# Patient Record
Sex: Male | Born: 1935 | Race: White | Hispanic: No | Smoking: Never smoker
Health system: Southern US, Community
[De-identification: ages and names within clinical notes are randomized; demographics above are authoritative.]

## PROBLEM LIST (undated history)

## (undated) DIAGNOSIS — B029 Zoster without complications: Secondary | ICD-10-CM

## (undated) DIAGNOSIS — K4090 Unilateral inguinal hernia, without obstruction or gangrene, not specified as recurrent: Secondary | ICD-10-CM

## (undated) DIAGNOSIS — C801 Malignant (primary) neoplasm, unspecified: Secondary | ICD-10-CM

## (undated) DIAGNOSIS — I447 Left bundle-branch block, unspecified: Secondary | ICD-10-CM

## (undated) DIAGNOSIS — Z87442 Personal history of urinary calculi: Secondary | ICD-10-CM

## (undated) DIAGNOSIS — K219 Gastro-esophageal reflux disease without esophagitis: Secondary | ICD-10-CM

## (undated) DIAGNOSIS — I509 Heart failure, unspecified: Secondary | ICD-10-CM

## (undated) DIAGNOSIS — Z9861 Coronary angioplasty status: Secondary | ICD-10-CM

## (undated) DIAGNOSIS — M1990 Unspecified osteoarthritis, unspecified site: Secondary | ICD-10-CM

## (undated) DIAGNOSIS — E785 Hyperlipidemia, unspecified: Secondary | ICD-10-CM

## (undated) DIAGNOSIS — N529 Male erectile dysfunction, unspecified: Secondary | ICD-10-CM

## (undated) DIAGNOSIS — I10 Essential (primary) hypertension: Secondary | ICD-10-CM

## (undated) DIAGNOSIS — I2510 Atherosclerotic heart disease of native coronary artery without angina pectoris: Secondary | ICD-10-CM

## (undated) HISTORY — DX: Male erectile dysfunction, unspecified: N52.9

## (undated) HISTORY — DX: Essential (primary) hypertension: I10

## (undated) HISTORY — DX: Left bundle-branch block, unspecified: I44.7

## (undated) HISTORY — DX: Unilateral inguinal hernia, without obstruction or gangrene, not specified as recurrent: K40.90

## (undated) HISTORY — DX: Atherosclerotic heart disease of native coronary artery without angina pectoris: I25.10

## (undated) HISTORY — DX: Coronary angioplasty status: Z98.61

## (undated) HISTORY — DX: Hyperlipidemia, unspecified: E78.5

## (undated) HISTORY — PX: MOHS SURGERY: SHX181

---

## 1977-10-19 HISTORY — PX: CHOLECYSTECTOMY: SHX55

## 1995-10-20 HISTORY — PX: CORONARY STENT PLACEMENT: SHX1402

## 1996-10-19 HISTORY — PX: CARDIAC CATHETERIZATION: SHX172

## 2004-11-14 ENCOUNTER — Encounter: Admission: RE | Admit: 2004-11-14 | Discharge: 2004-11-14 | Payer: Self-pay | Admitting: Orthopedic Surgery

## 2009-12-29 ENCOUNTER — Emergency Department (HOSPITAL_COMMUNITY): Admission: EM | Admit: 2009-12-29 | Discharge: 2009-12-29 | Payer: Self-pay | Admitting: Emergency Medicine

## 2011-01-12 LAB — POCT I-STAT, CHEM 8
BUN: 10 mg/dL (ref 6–23)
Chloride: 106 mEq/L (ref 96–112)
HCT: 38 % — ABNORMAL LOW (ref 39.0–52.0)
Sodium: 141 mEq/L (ref 135–145)

## 2012-05-06 ENCOUNTER — Encounter (INDEPENDENT_AMBULATORY_CARE_PROVIDER_SITE_OTHER): Payer: Self-pay | Admitting: General Surgery

## 2012-05-09 ENCOUNTER — Encounter (INDEPENDENT_AMBULATORY_CARE_PROVIDER_SITE_OTHER): Payer: Self-pay | Admitting: General Surgery

## 2012-05-09 ENCOUNTER — Ambulatory Visit (INDEPENDENT_AMBULATORY_CARE_PROVIDER_SITE_OTHER): Payer: Medicare Other | Admitting: General Surgery

## 2012-05-09 VITALS — BP 150/78 | HR 70 | Temp 97.6°F | Ht 67.0 in | Wt 167.4 lb

## 2012-05-09 DIAGNOSIS — K4020 Bilateral inguinal hernia, without obstruction or gangrene, not specified as recurrent: Secondary | ICD-10-CM

## 2012-05-09 NOTE — Progress Notes (Signed)
Patient ID: Parker Novel., male   DOB: 28-Jun-1932, 78 y.o.   MRN: [id number]  Chief Complaint  Patient presents with  . Pre-op Exam    eval RIH    HPI Parker Lazo. is a 78 y.o. male.  He is referred by Dr. Vianne Bulls for evaluation of a large right inguinal hernia.  This gentleman has no prior history of hernia. He's noticed a bulge in his right groin for 3 months. He says he's not having any pain.  Past history is significant for an open cholecystectomy 1979 through a long right paramedian incision. Dr. Elesa Hacker did the surgery. He has hypertension. He has CAD and had an LAD stent placed 1997. EKG shows left bundle branch block.  He is retired from [employer] and [employer] and did moderate lifting there.  He sees  his cardiologist  annually but cannot remember the name of his cardiologist. HPI  Past Medical History  Diagnosis Date  . Hypertension   . ASCVD (arteriosclerotic cardiovascular disease)     single vessel  . Dyslipidemia   . ED (erectile dysfunction)   . Left bundle branch block     chronic  . Inguinal hernia   . Postsurgical percutaneous transluminal coronary angioplasty status   . Coronary atherosclerosis of native coronary artery     Past Surgical History  Procedure Date  . Coronary stent placement 1997  . Cholecystectomy 1979    Family History  Problem Relation Age of Onset  . Arthritis Father     Social History History  Substance Use Topics  . Smoking status: Never Smoker   . Smokeless tobacco: Never Used  . Alcohol Use: No    No Known Allergies  Current Outpatient Prescriptions  Medication Sig Dispense Refill  . aspirin 81 MG tablet Take 81 mg by mouth daily.      Marland Kitchen atorvastatin (LIPITOR) 40 MG tablet Take 40 mg by mouth daily.      Marland Kitchen lisinopril-hydrochlorothiazide (PRINZIDE,ZESTORETIC) 20-12.5 MG per tablet Take 1 tablet by mouth daily.      . Lutein 20 MG CAPS Take 20 mg by mouth every other day.      . Multiple  Vitamins-Minerals ([employer] SPECTRAVITE PO) Take by mouth every other day.      . omega-3 acid ethyl esters (LOVAZA) 1 G capsule Take 2 g by mouth 2 (two) times daily.        Review of Systems Review of Systems  Constitutional: Negative for fever, chills and unexpected weight change.  HENT: Negative for hearing loss, congestion, sore throat, trouble swallowing and voice change.   Eyes: Negative for visual disturbance.  Respiratory: Negative for cough and wheezing.   Cardiovascular: Negative for chest pain, palpitations and leg swelling.  Gastrointestinal: Negative for nausea, vomiting, abdominal pain, diarrhea, constipation, blood in stool, abdominal distention, anal bleeding and rectal pain.  Genitourinary: Negative for hematuria and difficulty urinating.  Musculoskeletal: Negative for arthralgias.  Skin: Negative for rash and wound.  Neurological: Negative for seizures, syncope, weakness and headaches.  Hematological: Negative for adenopathy. Does not bruise/bleed easily.  Psychiatric/Behavioral: Negative for confusion.    Blood pressure 150/78, pulse 70, temperature 97.6 F (36.4 C), temperature source Temporal, height 5\' 7"  (1.702 m), weight 167 lb 6.4 oz (75.932 kg), SpO2 97.00%.  Physical Exam Physical Exam  Constitutional: He is oriented to person, place, and time. He appears well-developed and well-nourished. No distress.  HENT:  Head: Normocephalic.  Nose: Nose normal.  Mouth/Throat: No oropharyngeal exudate.  Eyes: Conjunctivae and EOM are normal. Pupils are equal, round, and reactive to light. Right eye exhibits no discharge. Left eye exhibits no discharge. No scleral icterus.  Neck: Normal range of motion. Neck supple. No JVD present. No tracheal deviation present. No thyromegaly present.  Cardiovascular: Normal rate, regular rhythm, normal heart sounds and intact distal pulses.   No murmur heard. Pulmonary/Chest: Effort normal and breath sounds normal. No stridor. No  respiratory distress. He has no wheezes. He has no rales. He exhibits no tenderness.  Abdominal: Soft. Bowel sounds are normal. He exhibits no distension and no mass. There is no tenderness. There is no rebound and no guarding.       Long right paramedian incision mostly in the right upper quadrant but extending below the umbilicus. Well healed no hernia. Nontender.  Genitourinary:       Very large right inguinal hernia. Smaller left inguinal hernia. Both reducible when supine. Testes descended but atrophic.  Musculoskeletal: Normal range of motion. He exhibits no edema and no tenderness.  Lymphadenopathy:    He has no cervical adenopathy.  Neurological: He is alert and oriented to person, place, and time. He has normal reflexes. Coordination normal.  Skin: Skin is warm and dry. No rash noted. He is not diaphoretic. No erythema. No pallor.  Psychiatric: He has a normal mood and affect. His behavior is normal. Judgment and thought content normal.    Data Reviewed Dr. Charlott Rakes office notes.  Assessment    Very large right inguinal hernia.  Smaller left inguinal hernia  CAD, stable, status post LAD stent 1997  Hypertension  Status post open cholecystectomy    Plan    We had a long talk about his large right inguinal hernia and the finding of a left inguinal hernia. He is minimally symptomatic, which is surprising. He was given the option of elective surgery at his convenience. After a long discussion he decided he wanted to have this done sometime later this summer  We will schedule for open repair of bilateral inguinal hernias with mesh. I think the recurrence rate would be too high to do the right side laparoscopically.  I discussed the indications, details, techniques, and numerous risks of inguinal hernia repair with him. I gave him written patient information booklet and discussed the drawings with him. He understands these issues. His questions were answered. He agrees with the  plan.  We will ask for cardiac clearance preop.  We will plan to keep him in the hospital one might for observation.       Angelia Mould. Derrell Lolling, M.D., [facility] General and Minimally invasive Surgery Breast and Colorectal Surgery Office:   [phone] Pager:   [phone]  05/09/2012, 3:51 PM

## 2012-05-09 NOTE — Patient Instructions (Addendum)
You have a very large right inguinal hernia and a smaller left inguinal hernia. I can push these back in.  There is no immediate danger.  We have talked about different options for management, and you have decided to have these repaired later on this summer.  We will need to have your cardiologist give [place] clearance for the surgery, and we will check with Dr. Tenny Craw to see who your cardiologist is.  We will set up the surgery to repair your bilateral inguinal hernias in the near future. We will plan to keep you in the hospital overnight.    Inguinal Hernia, Adult Muscles help keep everything in the body in its proper place. But if a weak spot in the muscles develops, something can poke through. That is called a hernia. When this happens in the lower part of the belly (abdomen), it is called an inguinal hernia. (It takes its name from a part of the body in this region called the inguinal canal.) A weak spot in the wall of muscles lets some fat or part of the small intestine bulge through. An inguinal hernia can develop at any age. Men get them more often than women. CAUSES  In adults, an inguinal hernia develops over time.  It can be triggered by:   Suddenly straining the muscles of the lower abdomen.   Lifting heavy objects.   Straining to have a bowel movement. Difficult bowel movements (constipation) can lead to this.   Constant coughing. This may be caused by smoking or lung disease.   Being overweight.   Being pregnant.   Working at a job that requires long periods of standing or heavy lifting.   Having had an inguinal hernia before.  One type can be an emergency situation. It is called a strangulated inguinal hernia. It develops if part of the small intestine slips through the weak spot and cannot get back into the abdomen. The blood supply can be cut off. If that happens, part of the intestine may die. This situation requires emergency surgery. SYMPTOMS  Often, a small inguinal  hernia has no symptoms. It is found when a healthcare provider does a physical exam. Larger hernias usually have symptoms.   In adults, symptoms may include:   A lump in the groin. This is easier to see when the person is standing. It might disappear when lying down.   In men, a lump in the scrotum.   Pain or burning in the groin. This occurs especially when lifting, straining or coughing.   A dull ache or feeling of pressure in the groin.   Signs of a strangulated hernia can include:   A bulge in the groin that becomes very painful and tender to the touch.   A bulge that turns red or purple.   Fever, nausea and vomiting.   Inability to have a bowel movement or to pass gas.  DIAGNOSIS  To decide if you have an inguinal hernia, a healthcare provider will probably do a physical examination.  This will include asking questions about any symptoms you have noticed.   The healthcare provider might feel the groin area and ask you to cough. If an inguinal hernia is felt, the healthcare provider may try to slide it back into the abdomen.   Usually no other tests are needed.  TREATMENT  Treatments can vary. The size of the hernia makes a difference. Options include:  Watchful waiting. This is often suggested if the hernia is small and you  have had no symptoms.   No medical procedure will be done unless symptoms develop.   You will need to watch closely for symptoms. If any occur, contact your healthcare provider right away.   Surgery. This is used if the hernia is larger or you have symptoms.   Open surgery. This is usually an outpatient procedure (you will not stay overnight in a hospital). An cut (incision) is made through the skin in the groin. The hernia is put back inside the abdomen. The weak area in the muscles is then repaired by herniorrhaphy or hernioplasty. Herniorrhaphy: in this type of surgery, the weak muscles are sewn back together. Hernioplasty: a patch or mesh is used  to close the weak area in the abdominal wall.   Laparoscopy. In this procedure, a surgeon makes small incisions. A thin tube with a tiny video camera (called a laparoscope) is put into the abdomen. The surgeon repairs the hernia with mesh by looking with the video camera and using two long instruments.  HOME CARE INSTRUCTIONS   After surgery to repair an inguinal hernia:   You will need to take pain medicine prescribed by your healthcare provider. Follow all directions carefully.   You will need to take care of the wound from the incision.   Your activity will be restricted for awhile. This will probably include no heavy lifting for several weeks. You also should not do anything too active for a few weeks. When you can return to work will depend on the type of job that you have.   During "watchful waiting" periods, you should:   Maintain a healthy weight.   Eat a diet high in fiber (fruits, vegetables and whole grains).   Drink plenty of fluids to avoid constipation. This means drinking enough water and other liquids to keep your urine clear or pale yellow.   Do not lift heavy objects.   Do not stand for long periods of time.   Quit smoking. This should keep you from developing a frequent cough.  SEEK MEDICAL CARE IF:   A bulge develops in your groin area.   You feel pain, a burning sensation or pressure in the groin. This might be worse if you are lifting or straining.   You develop a fever of more than 100.5 F (38.1 C).  SEEK IMMEDIATE MEDICAL CARE IF:   Pain in the groin increases suddenly.   A bulge in the groin gets bigger suddenly and does not go down.   For men, there is sudden pain in the scrotum. Or, the size of the scrotum increases.   A bulge in the groin area becomes red or purple and is painful to touch.   You have nausea or vomiting that does not go away.   You feel your heart beating much faster than normal.   You cannot have a bowel movement or pass  gas.   You develop a fever of more than 102.0 F (38.9 C).  Document Released: 02/21/2009 Document Revised: 09/24/2011 Document Reviewed: 02/21/2009 [facility] [place] Patient Information 2012 [place], [place].

## 2012-05-25 HISTORY — PX: CARDIOVASCULAR STRESS TEST: SHX262

## 2012-06-01 ENCOUNTER — Encounter (INDEPENDENT_AMBULATORY_CARE_PROVIDER_SITE_OTHER): Payer: Self-pay | Admitting: General Surgery

## 2012-06-01 NOTE — Progress Notes (Signed)
Received cardiac clearance from Dr. Eldridge Dace. Copy sent to medical records to be scanned into chart. Copy attached to OR posting sheet.

## 2012-06-02 ENCOUNTER — Encounter (HOSPITAL_COMMUNITY): Payer: Self-pay | Admitting: Pharmacy Technician

## 2012-06-08 ENCOUNTER — Encounter (INDEPENDENT_AMBULATORY_CARE_PROVIDER_SITE_OTHER): Payer: Self-pay

## 2012-06-13 ENCOUNTER — Encounter (HOSPITAL_COMMUNITY): Payer: Self-pay

## 2012-06-13 ENCOUNTER — Encounter (HOSPITAL_COMMUNITY)
Admission: RE | Admit: 2012-06-13 | Discharge: 2012-06-13 | Disposition: A | Discharge status: Home / Self Care | Payer: Medicare Other | Source: Ambulatory Visit | Attending: General Surgery | Admitting: General Surgery

## 2012-06-13 ENCOUNTER — Other Ambulatory Visit (INDEPENDENT_AMBULATORY_CARE_PROVIDER_SITE_OTHER): Payer: Self-pay | Admitting: General Surgery

## 2012-06-13 LAB — COMPREHENSIVE METABOLIC PANEL
ALT: 27 U/L (ref 0–53)
AST: 26 U/L (ref 0–37)
Alkaline Phosphatase: 86 U/L (ref 39–117)
CO2: 29 mEq/L (ref 19–32)
GFR calc Af Amer: >90 mL/min (ref >90)
GFR calc non Af Amer: 81 mL/min — ABNORMAL LOW (ref >90)
Glucose, Bld: 103 mg/dL — ABNORMAL HIGH (ref 70–99)
Potassium: 3.9 mEq/L (ref 3.5–5.1)
Sodium: 141 mEq/L (ref 135–145)
Total Protein: 7.2 g/dL (ref 6.0–8.3)

## 2012-06-13 LAB — CBC WITH DIFFERENTIAL/PLATELET
Basophils Absolute: 0 10*3/uL (ref 0.0–0.1)
Lymphocytes Relative: 33 % (ref 12–46)
Lymphs Abs: 2.4 10*3/uL (ref 0.7–4.0)
Neutrophils Relative %: 53 % (ref 43–77)
Platelets: 122 10*3/uL — ABNORMAL LOW (ref 150–400)
RBC: 4.04 MIL/uL — ABNORMAL LOW (ref 4.22–5.81)
WBC: 7.3 10*3/uL (ref 4.0–10.5)

## 2012-06-13 LAB — URINALYSIS, ROUTINE W REFLEX MICROSCOPIC
Bilirubin Urine: NEGATIVE
Hgb urine dipstick: NEGATIVE
Ketones, ur: NEGATIVE mg/dL
Nitrite: NEGATIVE
pH: 7 (ref 5.0–8.0)

## 2012-06-13 LAB — SURGICAL PCR SCREEN
MRSA, PCR: NEGATIVE
Staphylococcus aureus: NEGATIVE

## 2012-06-13 NOTE — Progress Notes (Addendum)
Spoke with Britta Mccreedy in Dr. Jacinto Halim office, orders in system have expired, need new surgical orders.   Requested cardiac clearance/OV note, EKG from [facility].  Pt was not aware of or did not remember discussing L inguinal hernia repair, so did not sign consent pending verification of order. Dr. Jacinto Halim note states that pt has both R & L inguinal hernias and discussion with pt regarding repair. Pt to sign consent DOS.

## 2012-06-13 NOTE — Pre-Procedure Instructions (Signed)
20 Parker Odonnell.  06/13/2012   Your procedure is scheduled on:  Wednesday August 28  Report to [facility] at 6:30 AM.  Call this number if you have problems the morning of surgery: [phone]   Remember:   Do not eat or drink:After Midnight.    Take these medicines the morning of surgery with A SIP OF WATER: none   Do not wear jewelry, make-up or nail polish.  Do not wear lotions, powders, or perfumes. You may wear deodorant.  Do not shave 48 hours prior to surgery. Men may shave face and neck.  Do not bring valuables to the hospital.  Contacts, dentures or bridgework may not be worn into surgery.  Leave suitcase in the car. After surgery it may be brought to your room.  For patients admitted to the hospital, checkout time is 11:00 AM the day of discharge.   Patients discharged the day of surgery will not be allowed to drive home.  Name and phone number of your driver: NA  Special Instructions: CHG Shower Use Special Wash: 1/2 bottle night before surgery and 1/2 bottle morning of surgery.   Please read over the following fact sheets that you were given: Pain Booklet, Coughing and Deep Breathing and Surgical Site Infection Prevention

## 2012-06-14 MED ORDER — CHLORHEXIDINE GLUCONATE 4 % EX LIQD: 1.0000 "application " | Freq: Once | CUTANEOUS | Status: DC

## 2012-06-14 MED ORDER — HEPARIN SODIUM (PORCINE) 5000 UNIT/ML IJ SOLN
5000.0000 [IU] | Freq: Once | INTRAMUSCULAR | Status: DC
  Filled 2012-06-14: qty 1

## 2012-06-14 MED ORDER — CEFAZOLIN SODIUM-DEXTROSE 2-3 GM-% IV SOLR
2.0000 g | INTRAVENOUS | Status: AC
  Administered 2012-06-15: 2 g via INTRAVENOUS
  Filled 2012-06-14: qty 50

## 2012-06-14 MED ORDER — CEFAZOLIN SODIUM-DEXTROSE 2-3 GM-% IV SOLR: 2.0000 g | INTRAVENOUS | Status: DC

## 2012-06-14 NOTE — Consult Note (Signed)
Anesthesia chart review: Patient is a 78 year old male scheduled for open repair of bilateral inguinal hernias with mesh on 06/15/2012 by Dr. Derrell Lolling. History includes nonsmoker, coronary artery disease s/p LAD stent '97, chronic left BBB, HTN, dyslipidemia, ED.  PCP is Dr. Duane Lope.    Cardiologist is Dr. Eldridge Dace [facility]). Patient was cleared for this procedure following a stress test on 05/20/12 that showed normal myocardial perfusion, post-stress EF 52%, low risk scan.  His last echo was in 2003 and showed mild LVH, trace-mild MR.    EKG on 06/13/12 showed NSR with first degree AVB, occasional PVCs, LAD, left BBB.    He had a negative CXR on 06/13/12.  Labs noted.  PLT 122.  H/H, AST/ALT WNL.  Total bilirubin 1.4. Cr 0.87. Glucose 103.  Plan to proceed if no acute CV symptoms.  Shonna Chock, PA-C

## 2012-06-14 NOTE — H&P (Signed)
Parker Odonnell.     MRN: [id number]   Description: 78 year old male  Provider: Ernestene Mention, MD  Department: [facility]       Diagnoses     Bilateral inguinal hernia   - Primary    550.92        Vitals    BP Pulse Temp Ht Wt BMI    150/78 70 97.6 F (36.4 C) (Temporal) 5\' 7"  (1.702 m) 167 lb 6.4 oz (75.932 kg) 26.22 kg/m2                History and Physical     Ernestene Mention, MD   Patient ID: Parker Odonnell., male   DOB: 1932/06/14, 78 y.o.   MRN: [id number]             HPI Parker Odonnell. is a 78 y.o. male.  He is referred by Dr. Vianne Bulls for evaluation of a large right inguinal hernia.   This gentleman has no prior history of hernia. He's noticed a bulge in his right groin for 3 months. He says he's not having any pain.   Past history is significant for an open cholecystectomy 1979 through a long right paramedian incision. Dr. Elesa Hacker did the surgery. He has hypertension. He has CAD and had an LAD stent placed 1997. EKG shows left bundle branch block.   He is retired from [employer] and [employer] and did moderate lifting there.   He sees  his cardiologist  annually but cannot remember the name of his cardiologist.     Past Medical History   Diagnosis  Date   .  Hypertension     .  ASCVD (arteriosclerotic cardiovascular disease)         single vessel   .  Dyslipidemia     .  ED (erectile dysfunction)     .  Left bundle branch block         chronic   .  Inguinal hernia     .  Postsurgical percutaneous transluminal coronary angioplasty status     .  Coronary atherosclerosis of native coronary artery         Past Surgical History   Procedure  Date   .  Coronary stent placement  1997   .  Cholecystectomy  1979       Family History   Problem  Relation  Age of Onset   .  Arthritis  Father        Social History History   Substance Use Topics   .  Smoking status:  Never Smoker    .  Smokeless tobacco:  Never Used     .  Alcohol Use:  No      No Known Allergies    Current Outpatient Prescriptions   Medication  Sig  Dispense  Refill   .  aspirin 81 MG tablet  Take 81 mg by mouth daily.         Marland Kitchen  atorvastatin (LIPITOR) 40 MG tablet  Take 40 mg by mouth daily.         Marland Kitchen  lisinopril-hydrochlorothiazide (PRINZIDE,ZESTORETIC) 20-12.5 MG per tablet  Take 1 tablet by mouth daily.         .  Lutein 20 MG CAPS  Take 20 mg by mouth every other day.         .  Multiple Vitamins-Minerals ([employer]S SPECTRAVITE PO)  Take by mouth every  other day.         .  omega-3 acid ethyl esters (LOVAZA) 1 G capsule  Take 2 g by mouth 2 (two) times daily.            Review of Systems  Constitutional: Negative for fever, chills and unexpected weight change.  HENT: Negative for hearing loss, congestion, sore throat, trouble swallowing and voice change.   Eyes: Negative for visual disturbance.  Respiratory: Negative for cough and wheezing.   Cardiovascular: Negative for chest pain, palpitations and leg swelling.  Gastrointestinal: Negative for nausea, vomiting, abdominal pain, diarrhea, constipation, blood in stool, abdominal distention, anal bleeding and rectal pain.  Genitourinary: Negative for hematuria and difficulty urinating.  Musculoskeletal: Negative for arthralgias.  Skin: Negative for rash and wound.  Neurological: Negative for seizures, syncope, weakness and headaches.  Hematological: Negative for adenopathy. Does not bruise/bleed easily.  Psychiatric/Behavioral: Negative for confusion.    Blood pressure 150/78, pulse 70, temperature 97.6 F (36.4 C), temperature source Temporal, height 5\' 7"  (1.702 m), weight 167 lb 6.4 oz (75.932 kg), SpO2 97.00%.   Physical Exam  Constitutional: He is oriented to person, place, and time. He appears well-developed and well-nourished. No distress.  HENT:   Head: Normocephalic.   Nose: Nose normal.   Mouth/Throat: No oropharyngeal exudate.  Eyes: Conjunctivae and EOM are  normal. Pupils are equal, round, and reactive to light. Right eye exhibits no discharge. Left eye exhibits no discharge. No scleral icterus.  Neck: Normal range of motion. Neck supple. No JVD present. No tracheal deviation present. No thyromegaly present.  Cardiovascular: Normal rate, regular rhythm, normal heart sounds and intact distal pulses.    No murmur heard. Pulmonary/Chest: Effort normal and breath sounds normal. No stridor. No respiratory distress. He has no wheezes. He has no rales. He exhibits no tenderness.  Abdominal: Soft. Bowel sounds are normal. He exhibits no distension and no mass. There is no tenderness. There is no rebound and no guarding.       Long right paramedian incision mostly in the right upper quadrant but extending below the umbilicus. Well healed no hernia. Nontender.  Genitourinary:       Very large right inguinal hernia. Smaller left inguinal hernia. Both reducible when supine. Testes descended but atrophic.  Musculoskeletal: Normal range of motion. He exhibits no edema and no tenderness.  Lymphadenopathy:    He has no cervical adenopathy.  Neurological: He is alert and oriented to person, place, and time. He has normal reflexes. Coordination normal.  Skin: Skin is warm and dry. No rash noted. He is not diaphoretic. No erythema. No pallor.  Psychiatric: He has a normal mood and affect. His behavior is normal. Judgment and thought content normal.    Data Reviewed Dr. Charlott Rakes office notes.   Assessment Very large right inguinal hernia.   Smaller left inguinal hernia   CAD, stable, status post LAD stent 1997   Hypertension   Status post open cholecystectomy   Plan We had a long talk about his large right inguinal hernia and the finding of a left inguinal hernia. He is minimally symptomatic, which is surprising. He was given the option of elective surgery at his convenience. After a long discussion he decided he wanted to have this done sometime later  this summer   We will schedule for open repair of bilateral inguinal hernias with mesh. I think the recurrence rate would be too high to do the right side laparoscopically.   I discussed the indications,  details, techniques, and numerous risks of inguinal hernia repair with him. I gave him written patient information booklet and discussed the drawings with him. He understands these issues. His questions were answered. He agrees with the plan.   We will ask for cardiac clearance preop.   We will plan to keep him in the hospital one might for observation.       Angelia Mould. Derrell Lolling, M.D., [facility] General and Minimally invasive Surgery Breast and Colorectal Surgery Office:   [phone] Pager:   [phone]

## 2012-06-15 ENCOUNTER — Encounter (HOSPITAL_COMMUNITY): Payer: Self-pay | Admitting: Vascular Surgery

## 2012-06-15 ENCOUNTER — Ambulatory Visit (HOSPITAL_COMMUNITY): Payer: Medicare Other | Admitting: Vascular Surgery

## 2012-06-15 ENCOUNTER — Ambulatory Visit (HOSPITAL_COMMUNITY)
Admission: RE | Admit: 2012-06-15 | Discharge: 2012-06-16 | Disposition: A | Discharge status: Home / Self Care | Payer: Medicare Other | Source: Ambulatory Visit | Attending: General Surgery | Admitting: General Surgery

## 2012-06-15 ENCOUNTER — Encounter (HOSPITAL_COMMUNITY)
Admission: RE | Disposition: A | Discharge status: Home / Self Care | Payer: Self-pay | Source: Ambulatory Visit | Attending: General Surgery

## 2012-06-15 ENCOUNTER — Encounter (HOSPITAL_COMMUNITY): Payer: Self-pay | Admitting: *Deleted

## 2012-06-15 DIAGNOSIS — Z01818 Encounter for other preprocedural examination: Secondary | ICD-10-CM | POA: Insufficient documentation

## 2012-06-15 DIAGNOSIS — K4020 Bilateral inguinal hernia, without obstruction or gangrene, not specified as recurrent: Secondary | ICD-10-CM

## 2012-06-15 DIAGNOSIS — Z01810 Encounter for preprocedural cardiovascular examination: Secondary | ICD-10-CM | POA: Insufficient documentation

## 2012-06-15 DIAGNOSIS — I10 Essential (primary) hypertension: Secondary | ICD-10-CM | POA: Insufficient documentation

## 2012-06-15 DIAGNOSIS — I2510 Atherosclerotic heart disease of native coronary artery without angina pectoris: Secondary | ICD-10-CM | POA: Insufficient documentation

## 2012-06-15 DIAGNOSIS — Z9861 Coronary angioplasty status: Secondary | ICD-10-CM | POA: Insufficient documentation

## 2012-06-15 DIAGNOSIS — Z01812 Encounter for preprocedural laboratory examination: Secondary | ICD-10-CM | POA: Insufficient documentation

## 2012-06-15 HISTORY — PX: INGUINAL HERNIA REPAIR: SHX194

## 2012-06-15 SURGERY — REPAIR, HERNIA, INGUINAL, ADULT
Anesthesia: General | Site: Groin | Laterality: Bilateral | Wound class: Clean

## 2012-06-15 MED ORDER — OMEGA-3-ACID ETHYL ESTERS 1 G PO CAPS
2.0000 g | ORAL_CAPSULE | Freq: Every day | ORAL | Status: DC
  Administered 2012-06-15 – 2012-06-16 (×2): 2 g via ORAL
  Filled 2012-06-15 (×2): qty 2

## 2012-06-15 MED ORDER — ASPIRIN 81 MG PO TABS: 81.0000 mg | ORAL_TABLET | Freq: Every day | ORAL | Status: DC

## 2012-06-15 MED ORDER — HEPARIN SODIUM (PORCINE) 5000 UNIT/ML IJ SOLN
5000.0000 [IU] | Freq: Three times a day (TID) | INTRAMUSCULAR | Status: DC
  Administered 2012-06-16: 5000 [IU] via SUBCUTANEOUS
  Filled 2012-06-15 (×4): qty 1

## 2012-06-15 MED ORDER — BUPIVACAINE HCL (PF) 0.5 % IJ SOLN
INTRAMUSCULAR | Status: AC
  Filled 2012-06-15: qty 30

## 2012-06-15 MED ORDER — FENTANYL CITRATE 0.05 MG/ML IJ SOLN
INTRAMUSCULAR | Status: DC | PRN
  Administered 2012-06-15 (×3): 50 ug via INTRAVENOUS

## 2012-06-15 MED ORDER — GLYCOPYRROLATE 0.2 MG/ML IJ SOLN
INTRAMUSCULAR | Status: DC | PRN
  Administered 2012-06-15: 0.4 mg via INTRAVENOUS

## 2012-06-15 MED ORDER — LIDOCAINE HCL (CARDIAC) 20 MG/ML IV SOLN: INTRAVENOUS | Status: DC | PRN

## 2012-06-15 MED ORDER — ONDANSETRON HCL 4 MG/2ML IJ SOLN
INTRAMUSCULAR | Status: DC | PRN
  Administered 2012-06-15: 4 mg via INTRAVENOUS

## 2012-06-15 MED ORDER — LIDOCAINE HCL (CARDIAC) 20 MG/ML IV SOLN
INTRAVENOUS | Status: DC | PRN
  Administered 2012-06-15: 60 mg via INTRAVENOUS

## 2012-06-15 MED ORDER — LACTATED RINGERS IV SOLN
INTRAVENOUS | Status: DC | PRN
  Administered 2012-06-15 (×3): via INTRAVENOUS

## 2012-06-15 MED ORDER — KETOROLAC TROMETHAMINE 30 MG/ML IJ SOLN
INTRAMUSCULAR | Status: AC
  Filled 2012-06-15: qty 1

## 2012-06-15 MED ORDER — SODIUM CHLORIDE 0.9 % IR SOLN
Status: DC | PRN
  Administered 2012-06-15: 1

## 2012-06-15 MED ORDER — POTASSIUM CHLORIDE IN NACL 20-0.9 MEQ/L-% IV SOLN
INTRAVENOUS | Status: DC
  Administered 2012-06-15 – 2012-06-16 (×2): via INTRAVENOUS
  Filled 2012-06-15 (×5): qty 1000

## 2012-06-15 MED ORDER — PROPOFOL 10 MG/ML IV EMUL
INTRAVENOUS | Status: DC | PRN
  Administered 2012-06-15: 160 mg via INTRAVENOUS

## 2012-06-15 MED ORDER — KETOROLAC TROMETHAMINE 15 MG/ML IJ SOLN
15.0000 mg | Freq: Once | INTRAMUSCULAR | Status: AC
  Administered 2012-06-15: 15 mg via INTRAVENOUS
  Filled 2012-06-15: qty 1

## 2012-06-15 MED ORDER — LISINOPRIL 20 MG PO TABS
20.0000 mg | ORAL_TABLET | Freq: Every day | ORAL | Status: DC
  Administered 2012-06-15: 20 mg via ORAL
  Filled 2012-06-15 (×2): qty 1

## 2012-06-15 MED ORDER — LUTEIN 20 MG PO CAPS: 20.0000 mg | ORAL_CAPSULE | ORAL | Status: DC

## 2012-06-15 MED ORDER — POLYVINYL ALCOHOL 1.4 % OP SOLN
1.0000 [drp] | Freq: Every day | OPHTHALMIC | Status: DC
  Administered 2012-06-15 – 2012-06-16 (×2): 1 [drp] via OPHTHALMIC
  Filled 2012-06-15: qty 15

## 2012-06-15 MED ORDER — ARTIFICIAL TEARS OP OINT
TOPICAL_OINTMENT | OPHTHALMIC | Status: DC | PRN
  Administered 2012-06-15: 1 via OPHTHALMIC

## 2012-06-15 MED ORDER — LIDOCAINE-EPINEPHRINE 0.5 %-1:200000 IJ SOLN
INTRAMUSCULAR | Status: AC
  Filled 2012-06-15: qty 1

## 2012-06-15 MED ORDER — ROCURONIUM BROMIDE 100 MG/10ML IV SOLN
INTRAVENOUS | Status: DC | PRN
  Administered 2012-06-15: 50 mg via INTRAVENOUS

## 2012-06-15 MED ORDER — ASPIRIN EC 81 MG PO TBEC
81.0000 mg | DELAYED_RELEASE_TABLET | Freq: Every day | ORAL | Status: DC
  Administered 2012-06-15 – 2012-06-16 (×2): 81 mg via ORAL
  Filled 2012-06-15 (×2): qty 1

## 2012-06-15 MED ORDER — DIPHENHYDRAMINE HCL 50 MG/ML IJ SOLN
INTRAMUSCULAR | Status: DC | PRN
  Administered 2012-06-15: 50 mg via INTRAVENOUS

## 2012-06-15 MED ORDER — ATORVASTATIN CALCIUM 40 MG PO TABS
40.0000 mg | ORAL_TABLET | Freq: Every day | ORAL | Status: DC
  Administered 2012-06-15: 40 mg via ORAL
  Filled 2012-06-15 (×2): qty 1

## 2012-06-15 MED ORDER — ONDANSETRON HCL 4 MG/2ML IJ SOLN: 4.0000 mg | Freq: Once | INTRAMUSCULAR | Status: DC | PRN

## 2012-06-15 MED ORDER — ONDANSETRON HCL 4 MG/2ML IJ SOLN: 4.0000 mg | Freq: Four times a day (QID) | INTRAMUSCULAR | Status: DC | PRN

## 2012-06-15 MED ORDER — HYDROCHLOROTHIAZIDE 12.5 MG PO CAPS
12.5000 mg | ORAL_CAPSULE | Freq: Every day | ORAL | Status: DC
  Administered 2012-06-15 – 2012-06-16 (×2): 12.5 mg via ORAL
  Filled 2012-06-15 (×2): qty 1

## 2012-06-15 MED ORDER — POLYETHYL GLYCOL-PROPYL GLYCOL 0.4-0.3 % OP SOLN: 1.0000 [drp] | Freq: Every day | OPHTHALMIC | Status: DC

## 2012-06-15 MED ORDER — ONDANSETRON HCL 4 MG PO TABS: 4.0000 mg | ORAL_TABLET | Freq: Four times a day (QID) | ORAL | Status: DC | PRN

## 2012-06-15 MED ORDER — OXYCODONE-ACETAMINOPHEN 5-325 MG PO TABS: 1.0000 | ORAL_TABLET | ORAL | Status: DC | PRN

## 2012-06-15 MED ORDER — LIDOCAINE HCL 4 % MT SOLN
OROMUCOSAL | Status: DC | PRN
  Administered 2012-06-15: 4 mL via TOPICAL

## 2012-06-15 MED ORDER — HYDROMORPHONE HCL PF 1 MG/ML IJ SOLN: 0.2500 mg | INTRAMUSCULAR | Status: DC | PRN

## 2012-06-15 MED ORDER — MORPHINE SULFATE 2 MG/ML IJ SOLN
2.0000 mg | INTRAMUSCULAR | Status: DC | PRN
  Filled 2012-06-15: qty 1

## 2012-06-15 MED ORDER — LISINOPRIL-HYDROCHLOROTHIAZIDE 20-12.5 MG PO TABS: 1.0000 | ORAL_TABLET | Freq: Every day | ORAL | Status: DC

## 2012-06-15 MED ORDER — OMEGA-3-ACID ETHYL ESTERS 1 G PO CAPS
1.0000 g | ORAL_CAPSULE | Freq: Every day | ORAL | Status: DC
  Filled 2012-06-15 (×2): qty 1

## 2012-06-15 MED ORDER — CEFAZOLIN SODIUM-DEXTROSE 2-3 GM-% IV SOLR
2.0000 g | Freq: Four times a day (QID) | INTRAVENOUS | Status: AC
  Administered 2012-06-15 – 2012-06-16 (×3): 2 g via INTRAVENOUS
  Filled 2012-06-15 (×4): qty 50

## 2012-06-15 MED ORDER — LIDOCAINE-EPINEPHRINE (PF) 1 %-1:200000 IJ SOLN
INTRAMUSCULAR | Status: AC
  Filled 2012-06-15: qty 10

## 2012-06-15 MED ORDER — LIDOCAINE-EPINEPHRINE 0.5 %-1:200000 IJ SOLN
INTRAMUSCULAR | Status: DC | PRN
  Administered 2012-06-15: 50 mL

## 2012-06-15 MED ORDER — HEPARIN SODIUM (PORCINE) 5000 UNIT/ML IJ SOLN
5000.0000 [IU] | Freq: Once | INTRAMUSCULAR | Status: AC
  Administered 2012-06-15: 5000 [IU] via SUBCUTANEOUS

## 2012-06-15 MED ORDER — NEOSTIGMINE METHYLSULFATE 1 MG/ML IJ SOLN
INTRAMUSCULAR | Status: DC | PRN
  Administered 2012-06-15: 3 mg via INTRAVENOUS

## 2012-06-15 SURGICAL SUPPLY — 56 items
BLADE SURG 10 STRL SS (BLADE) ×2 IMPLANT
BLADE SURG 15 STRL LF DISP TIS (BLADE) ×1 IMPLANT
BLADE SURG 15 STRL SS (BLADE) ×1
BLADE SURG ROTATE 9660 (MISCELLANEOUS) ×2 IMPLANT
CANISTER SUCTION 2500CC (MISCELLANEOUS) ×2 IMPLANT
CHLORAPREP W/TINT 26ML (MISCELLANEOUS) ×2 IMPLANT
CLOTH BEACON ORANGE TIMEOUT ST (SAFETY) ×2 IMPLANT
COVER SURGICAL LIGHT HANDLE (MISCELLANEOUS) ×2 IMPLANT
DECANTER SPIKE VIAL GLASS SM (MISCELLANEOUS) ×2 IMPLANT
DERMABOND ADVANCED (GAUZE/BANDAGES/DRESSINGS) ×2
DERMABOND ADVANCED .7 DNX12 (GAUZE/BANDAGES/DRESSINGS) ×2 IMPLANT
DRAIN PENROSE 1/2X12 LTX STRL (WOUND CARE) ×2 IMPLANT
DRAPE LAPAROTOMY TRNSV 102X78 (DRAPE) ×2 IMPLANT
DRAPE UTILITY 15X26 W/TAPE STR (DRAPE) ×4 IMPLANT
ELECT CAUTERY BLADE 6.4 (BLADE) ×2 IMPLANT
ELECT REM PT RETURN 9FT ADLT (ELECTROSURGICAL) ×2
ELECTRODE REM PT RTRN 9FT ADLT (ELECTROSURGICAL) ×1 IMPLANT
GLOVE BIOGEL PI IND STRL 6.5 (GLOVE) ×1 IMPLANT
GLOVE BIOGEL PI INDICATOR 6.5 (GLOVE) ×1
GLOVE ECLIPSE 6.5 STRL STRAW (GLOVE) ×2 IMPLANT
GLOVE EUDERMIC 7 POWDERFREE (GLOVE) ×2 IMPLANT
GLOVE SURG SS PI 6.5 STRL IVOR (GLOVE) ×2 IMPLANT
GOWN PREVENTION PLUS XLARGE (GOWN DISPOSABLE) ×2 IMPLANT
GOWN STRL NON-REIN LRG LVL3 (GOWN DISPOSABLE) ×2 IMPLANT
KIT BASIN OR (CUSTOM PROCEDURE TRAY) ×2 IMPLANT
KIT ROOM TURNOVER OR (KITS) ×2 IMPLANT
MESH ULTRAPRO 3X6 7.6X15CM (Mesh General) ×4 IMPLANT
NEEDLE HYPO 25GX1X1/2 BEV (NEEDLE) ×2 IMPLANT
NS IRRIG 1000ML POUR BTL (IV SOLUTION) ×2 IMPLANT
PACK SURGICAL SETUP 50X90 (CUSTOM PROCEDURE TRAY) ×2 IMPLANT
PAD ARMBOARD 7.5X6 YLW CONV (MISCELLANEOUS) ×2 IMPLANT
PENCIL BUTTON HOLSTER BLD 10FT (ELECTRODE) ×2 IMPLANT
SOLUTION BETADINE 4OZ (MISCELLANEOUS) ×2 IMPLANT
SPECIMEN JAR SMALL (MISCELLANEOUS) IMPLANT
SPONGE INTESTINAL PEANUT (DISPOSABLE) ×2 IMPLANT
SPONGE LAP 18X18 X RAY DECT (DISPOSABLE) ×2 IMPLANT
SPONGE LAP 4X18 X RAY DECT (DISPOSABLE) ×2 IMPLANT
SUT MNCRL AB 4-0 PS2 18 (SUTURE) ×4 IMPLANT
SUT PROLENE 2 0 CT2 30 (SUTURE) ×12 IMPLANT
SUT SILK 2 0 (SUTURE) ×1
SUT SILK 2 0 SH (SUTURE) IMPLANT
SUT SILK 2-0 18XBRD TIE 12 (SUTURE) ×1 IMPLANT
SUT VIC AB 2-0 CT1 27 (SUTURE) ×1
SUT VIC AB 2-0 CT1 TAPERPNT 27 (SUTURE) ×1 IMPLANT
SUT VIC AB 2-0 CTB1 (SUTURE) ×2 IMPLANT
SUT VIC AB 2-0 SH 27 (SUTURE) ×3
SUT VIC AB 2-0 SH 27X BRD (SUTURE) ×3 IMPLANT
SUT VIC AB 3-0 SH 27 (SUTURE) ×2
SUT VIC AB 3-0 SH 27XBRD (SUTURE) ×2 IMPLANT
SUT VICRYL AB 2 0 TIES (SUTURE) ×2 IMPLANT
SYR BULB 3OZ (MISCELLANEOUS) ×2 IMPLANT
SYR CONTROL 10ML LL (SYRINGE) ×2 IMPLANT
TOWEL OR 17X24 6PK STRL BLUE (TOWEL DISPOSABLE) ×2 IMPLANT
TOWEL OR 17X26 10 PK STRL BLUE (TOWEL DISPOSABLE) ×2 IMPLANT
TUBE CONNECTING 12X1/4 (SUCTIONS) ×4 IMPLANT
YANKAUER SUCT BULB TIP NO VENT (SUCTIONS) ×4 IMPLANT

## 2012-06-15 NOTE — Op Note (Addendum)
Patient Name:           Parker Odonnell.   Date of Surgery:        06/15/2012  Pre op Diagnosis:      Bilateral inguinal hernia  Post op Diagnosis:    Bilateral inguinal hernia  Procedure:                 Open repair of bilateral inguinal hernias with ultra Pro mesh Armanda Heritage repair)  Surgeon:                     Angelia Mould. Derrell Lolling, M.D., FACS  Assistant:                      none  Operative Indications:   Parker Novel. is a 78 y.o. male. He is referred by Dr. Vianne Bulls for evaluation of a large right inguinal hernia.   Parker Odonnell has no prior history of hernia. He's noticed a bulge in his right groin for 3 months. He says he's not having any pain.  Exam reveals a very large right inguinal hernia, larger than a baseball and a smaller left inguinal hernia. Both are essentially reducible when supine. Testes are descended but are atrophic. Past history is significant for an open cholecystectomy 1979 through a long right paramedian incision.  He has hypertension. He has CAD and had an LAD stent placed 1997. EKG shows left bundle branch block.   Operative Findings:       The patient had bilateral indirect inguinal hernias. On the left side there was a very large sliding indirect hernia. On the right side there was a simple indirect hernia. There were large lipomas and fatty tissues associated with the cord structures which required careful dissection.  Procedure in Detail:          Following the induction of general endotracheal anesthesia, a surgical time out was performed, intravenous antibiotics were given, and the abdomen and genitalia were prepped and draped in a sterile fashion. 0.5% Marcaine with epinephrine was used as local infiltration anesthetic on both sides.  I will dictate the operative procedure for both hernias simultaneously as the technique was essentially identical.  Bilateral transverse, somewhat oblique incisions were made. Dissection was carried down through  subcutaneous tissue exposing the aponeurosis of the external oblique. The external oblique was incised in the direction of its fibers, opening up the external inguinal ring. The external oblique was dissected away from the underlying tissues and self-retaining retractors were placed. On both sides there were small sensory nerves which were traced back to their emergency from the muscles laterally, clamped, divided, and ligated with 2-0 silk ties. On both sides the cord structures were mobilized and encircled with a Penrose drain. Cremasteric muscle fibers were skeletonized. Large lipomas were dissected away from the cord structures and away from the indirect sacs. These were dissected back to the level of the internal ring, clamped, amputated, and ligated with 2-0 Vicryl ties. The indirect sacs were carefully dissected away from the cord structures. The right  side I simply reduced the sac and oversewed the internal ring laterally with 2 interrupted figure-of-eight sutures of 2-0 Vicryl. On the left side I then dissected the indirect sac and  completely freed it up and found that it was empty. I opened the sac and  there were no abnormal contents. I then twisted the sac and suture ligated it at the level of the  internal ring with a 2-0 Vicryl suture ligature. The redundant sac was excised. I also placed a figure-of-eight suture of 2-0 Vicryl on the left side lateral to the cord structures to close the ring down   On each side I placed a 3" x 6" piece of ultra Pro mesh. I customized the size and contour the mesh to fit the anatomy. On each side I placed the mesh so as to generously overlap the fascia at the pubic tubercle, then placed a running suture of 2-0 Prolene along the inguinal ligament until it was well lateral to the internal ring. Medially, superiorly and superior laterally I placed multiple mattress sutures of 2-0 Prolene. On each side the mesh was incised laterally so as to wraparound the cord structures  at the internal ring. On each side the tails of the mesh were overlapped and sutures were completed. Parker provided a very secure repair both medial and lateral to the internal ring but allowed adequate fingertip opening for the cord structures. There was no bleeding. The wounds were irrigated with saline. On each side the external oblique was closed with a running suture of 2-0 Vicryl placing the cord structures deep to the external oblique.    Scarpa's fascia was closed with 3-0 Vicryl sutures and the skin closed with a running subcuticular suture of 4-0 Monocryl and Dermabond. The patient tolerated the procedure well taken to recovery in stable condition. Estimated blood loss 25 cc. Complications none. Counts correct.     Angelia Mould. Derrell Lolling, M.D., FACS General and Minimally Invasive Surgery Breast and Colorectal Surgery  06/15/2012 11:07 AM

## 2012-06-15 NOTE — Transfer of Care (Signed)
Immediate Anesthesia Transfer of Care Note  Patient: Parker Odonnell.  Procedure(s) Performed: Procedure(s) (LRB): HERNIA REPAIR INGUINAL ADULT (Bilateral) INSERTION OF MESH (Bilateral)  Patient Location: PACU  Anesthesia Type: General  Level of Consciousness: awake, alert , oriented and sedated  Airway & Oxygen Therapy: Patient Spontanous Breathing and Patient connected to nasal cannula oxygen  Post-op Assessment: Report given to PACU RN, Post -op Vital signs reviewed and stable and Patient moving all extremities  Post vital signs: Reviewed and stable  Complications: No apparent anesthesia complications

## 2012-06-15 NOTE — Anesthesia Preprocedure Evaluation (Signed)
Anesthesia Evaluation  Patient identified by MRN, date of birth, ID band Patient awake    Reviewed: Allergy & Precautions, H&P , NPO status , Patient's Chart, lab work & pertinent test results  Airway Mallampati: I TM Distance: >3 FB Neck ROM: full    Dental   Pulmonary          Cardiovascular hypertension, + CAD and + Cardiac Stents Rhythm:regular Rate:Normal     Neuro/Psych    GI/Hepatic   Endo/Other    Renal/GU      Musculoskeletal   Abdominal   Peds  Hematology   Anesthesia Other Findings   Reproductive/Obstetrics                           Anesthesia Physical Anesthesia Plan  ASA: II  Anesthesia Plan: General   Post-op Pain Management:    Induction: Intravenous  Airway Management Planned: Oral ETT  Additional Equipment:   Intra-op Plan:   Post-operative Plan: Extubation in OR  Informed Consent: I have reviewed the patients History and Physical, chart, labs and discussed the procedure including the risks, benefits and alternatives for the proposed anesthesia with the patient or authorized representative who has indicated his/her understanding and acceptance.     Plan Discussed with: CRNA, Anesthesiologist and Surgeon  Anesthesia Plan Comments:         Anesthesia Quick Evaluation

## 2012-06-15 NOTE — Preoperative (Signed)
Beta Blockers   Reason not to administer Beta Blockers:Not Applicable 

## 2012-06-15 NOTE — Interval H&P Note (Signed)
History and Physical Interval Note:  06/15/2012 8:23 AM  Parker Odonnell.  has presented today for surgery, with the diagnosis of bilateral inguinal hernia  The goals and the  various methods of treatment have been discussed with the patient and family. After consideration of risks, benefits and other options for treatment, the patient has consented to  Procedure(s) (LRB): HERNIA REPAIR INGUINAL ADULT (Bilateral) INSERTION OF MESH (Bilateral) as a surgical intervention .  The patient's history has been reviewed, patient examined, no change in status, stable for surgery.  I have reviewed the patient's chart and labs.  Questions were answered to the patient's satisfaction.     Ernestene Mention

## 2012-06-15 NOTE — Anesthesia Postprocedure Evaluation (Signed)
  Anesthesia Post-op Note  Patient: Parker Odonnell.  Procedure(s) Performed: Procedure(s) (LRB): HERNIA REPAIR INGUINAL ADULT (Bilateral) INSERTION OF MESH (Bilateral)  Patient Location: PACU  Anesthesia Type: General  Level of Consciousness: awake, alert , oriented and patient cooperative  Airway and Oxygen Therapy: Patient Spontanous Breathing and Patient connected to nasal cannula oxygen  Post-op Pain: mild  Post-op Assessment: Post-op Vital signs reviewed, Patient's Cardiovascular Status Stable, Respiratory Function Stable, Patent Airway, No signs of Nausea or vomiting and Pain level controlled  Post-op Vital Signs: stable  Complications: No apparent anesthesia complications

## 2012-06-16 ENCOUNTER — Encounter (HOSPITAL_COMMUNITY): Payer: Self-pay | Admitting: General Surgery

## 2012-06-16 MED ORDER — POLYETHYLENE GLYCOL 3350 17 G PO PACK
17.0000 g | PACK | Freq: Once | ORAL | Status: AC
  Administered 2012-06-16: 17 g via ORAL
  Filled 2012-06-16: qty 1

## 2012-06-16 MED ORDER — OXYCODONE-ACETAMINOPHEN 7.5-325 MG PO TABS: 1.0000 | ORAL_TABLET | ORAL | Status: AC | PRN

## 2012-06-16 NOTE — Progress Notes (Signed)
Patient discharged to home with instructions given with son in law, verbalized underwstanding.

## 2012-06-16 NOTE — Discharge Summary (Signed)
Patient ID: Parker Odonnell [id number] 78 y.o. 1932/08/19  06/15/2012  Discharge date and time: Aug. 29, 2013  Admitting Physician: Ernestene Mention  Discharge Physician: Ernestene Mention  Admission Diagnoses: bilateral inguinal hernia  Discharge Diagnoses: Bilateral inguinal hernias  Operations: Procedure(s): BILATERAL HERNIA REPAIR INGUINAL ADULT INSERTION OF MESH  Admission Condition: good  Discharged Condition: good  Indication for Admission:  Parker Odonnell. is a 78 y.o. male. He was referred by Dr. Vianne Bulls for evaluation of a large right inguinal hernia. This gentleman has no prior history of hernia. He's noticed a bulge in his right groin for 3 months. He says he's not having any pain. Exam reveals a very large right inguinal hernia, larger than a baseball and a smaller left inguinal hernia. Both are essentially reducible when supine. Testes are descended but are atrophic.  Past history is significant for an open cholecystectomy 1979 through a long right paramedian incision. He has hypertension. He has CAD and had an LAD stent placed 1997. EKG shows left bundle branch block.he was admitted postoperatively for observation to observe for complications, pain control, urinary retention, and bleeding.   Hospital Course: on the day of admission the patient was taken to the operating room and underwent open repair of bilateral inguinal hernias with mesh. The hernia on the right side was very complicated and a large. The left side was smaller but still significant. The hernia repairs were uneventful. And overnight the patient did fairly well. He has some pain but it is controlled. He is able to angulate to the bathroom and voided without difficulty. He slept fairly well. He tolerated diet without nausea. On the morning after surgery he wanted to go home. Examination on postop day 1 revealed uneventful wound healing minimal bruising no hematoma or complication. It was felt that  he met discharge criteria and discharge was arranged.  Consults: None  Significant Diagnostic Studies: none  Treatments: surgery: open repair of bilateral inguinal hernias with mesh  Disposition: Home  Patient Instructions:   Parker Odonnell, Premo.  Home Medication Instructions [id number]   Printed on:06/16/12 0747  Medication Information                    Lutein 20 MG CAPS Take 20 mg by mouth every other day.           omega-3 acid ethyl esters (LOVAZA) 1 G capsule Take 1-2 g by mouth 2 (two) times daily. Take 2 capsules in AM and 1 capsule in PM           Multiple Vitamins-Minerals ([employer] SPECTRAVITE PO) Take by mouth every other day.           atorvastatin (LIPITOR) 40 MG tablet Take 40 mg by mouth daily.           aspirin 81 MG tablet Take 81 mg by mouth daily. Last dose prior to surgery 06/12/12           lisinopril-hydrochlorothiazide (PRINZIDE,ZESTORETIC) 20-12.5 MG per tablet Take 1 tablet by mouth daily.           Polyethyl Glycol-Propyl Glycol (SYSTANE) 0.4-0.3 % SOLN Apply 1 drop to eye daily.           oxyCODONE-acetaminophen (PERCOCET) 7.5-325 MG per tablet Take 1 tablet by mouth every 4 (four) hours as needed for pain.             Activity: activity was discussed in detail. No driving for 2 weeks.  No sports or heavy lifting for 5 weeks. He may shower. Diet: cardiac diet Wound Care: patient may shower, but basically otherwise keep wound clean and dry.  Follow-up:  With Dr. Derrell Lolling in 4 weeks.  Signed: Angelia Mould. Derrell Lolling, M.D., FACS General and minimally invasive surgery Breast and Colorectal Surgery  06/16/2012, 7:47 AM

## 2012-06-16 NOTE — Progress Notes (Signed)
1 Day Post-Op  Subjective: Alert. Has ambulated to bathroom but not in the hall. Voiding without difficulty. Tolerating diet without nausea. Some  Pain but is  is under control. Wants to go home.  Objective: Vital signs in last 24 hours: Temp:  [96.8 F (36 C)-99.7 F (37.6 C)] 98.5 F (36.9 C) (08/29 0515) Pulse Rate:  [49-76] 67  (08/29 0515) Resp:  [14-16] 16  (08/29 0515) BP: (108-170)/(38-78) 108/38 mmHg (08/29 0515) SpO2:  [91 %-100 %] 95 % (08/29 0515) Last BM Date: 06/14/12  Intake/Output from previous day: 08/28 0701 - 08/29 0700 In: 4315 [P.O.:590; I.V.:3725] Out: 495 [Urine:475; Blood:20] Intake/Output this shift:    General appearance: alert. Mental status normal. In no distress. Cooperative. Resp: clear to auscultation bilaterally Male genitalia: normal, bilateral inguinal incisions look fine. Small amount of bruising but no hematoma. No fluid collections. Penis scrotum and testes normal. Abdomen soft.  Lab Results:  No results found for this or any previous visit (from the past 24 hour(s)).   Studies/Results: @RISRSLT24 @     . aspirin EC  81 mg Oral Daily  . atorvastatin  40 mg Oral q1800  .  ceFAZolin (ANCEF) IV  2 g Intravenous 60 min Pre-Op  .  ceFAZolin (ANCEF) IV  2 g Intravenous Q6H  . heparin  5,000 Units Subcutaneous Once  . heparin  5,000 Units Subcutaneous Q8H  . lisinopril  20 mg Oral Daily   And  . hydrochlorothiazide  12.5 mg Oral Daily  . ketorolac  15 mg Intravenous Once  . ketorolac      . omega-3 acid ethyl esters  1 g Oral QHS  . omega-3 acid ethyl esters  2 g Oral Daily  . polyvinyl alcohol  1 drop Both Eyes Daily  . DISCONTD: aspirin  81 mg Oral Daily  . DISCONTD:  ceFAZolin (ANCEF) IV  2 g Intravenous 60 min Pre-Op  . DISCONTD: chlorhexidine  1 application Topical Once  . DISCONTD: chlorhexidine  1 application Topical Once  . DISCONTD: heparin  5,000 Units Subcutaneous Once  . DISCONTD: lisinopril-hydrochlorothiazide  1 tablet  Oral Daily  . DISCONTD: Lutein  20 mg Oral QODAY  . DISCONTD: Polyethyl Glycol-Propyl Glycol  1 drop Ophthalmic Daily     Assessment/Plan: s/p Procedure(s): HERNIA REPAIR INGUINAL ADULT INSERTION OF MESH  POD #1. Bilateral inguinal hernia repair. Stable and doing well. Discharge home today. MiraLAX one dose. Prescription for Percocet given Return to see me in office in 3-4 weeks. Diet and activities discussed.    LOS: 1 day    Camesha Farooq M. Derrell Lolling, M.D., [facility] General and Minimally invasive Surgery Breast and Colorectal Surgery Office:   [phone] Pager:   [phone]  06/16/2012  . .prob

## 2012-07-06 ENCOUNTER — Encounter (INDEPENDENT_AMBULATORY_CARE_PROVIDER_SITE_OTHER): Payer: Self-pay

## 2012-07-08 ENCOUNTER — Ambulatory Visit (INDEPENDENT_AMBULATORY_CARE_PROVIDER_SITE_OTHER): Payer: Medicare Other | Admitting: General Surgery

## 2012-07-08 ENCOUNTER — Encounter (INDEPENDENT_AMBULATORY_CARE_PROVIDER_SITE_OTHER): Payer: Self-pay | Admitting: General Surgery

## 2012-07-08 VITALS — BP 128/76 | HR 72 | Temp 98.1°F | Resp 16 | Ht 69.0 in | Wt 167.1 lb

## 2012-07-08 DIAGNOSIS — K4020 Bilateral inguinal hernia, without obstruction or gangrene, not specified as recurrent: Secondary | ICD-10-CM

## 2012-07-08 NOTE — Patient Instructions (Signed)
You are recovering from your bilateral inguinal hernia surgery without any obvious complications.  The thickening in the scars will go away  over the next 3-6 months.  You may resume normal physical activities without restriction after September 28.  Return to see Dr. Derrell Lolling if further problems arise.

## 2012-07-08 NOTE — Progress Notes (Signed)
Patient ID: Parker Odonnell., male   DOB: 12/13/1931, 78 y.o.   MRN: [id number] History: This patient underwent open repair of large bilateral inguinal hernias on August 28. He is doing well. No pain now. Minimal bruising. Walking around the block and driving his car.  Exam: Patient looks well. In no distress. Bilateral inguinal incisions are healing without any sign of hematoma or infection. There is significant thickening along the right cord structures but there is no evidence of hernia and the hernia repairs appear intact. He is nontender. Penis, scrotum and testes feel normal.  Assessment: Bilateral inguinal hernias. Recovering uneventfully following open repair with mesh  Plan: Resume normal activities without restriction after September 28. Return to see me when necessary    ,Angelia Mould. Derrell Lolling, M.D., [facility] General and Minimally invasive Surgery Breast and Colorectal Surgery Office:   [phone] Pager:   [phone]

## 2013-09-09 ENCOUNTER — Encounter: Payer: Self-pay | Admitting: Interventional Cardiology

## 2013-09-09 DIAGNOSIS — Z9861 Coronary angioplasty status: Secondary | ICD-10-CM | POA: Insufficient documentation

## 2013-09-09 DIAGNOSIS — I2510 Atherosclerotic heart disease of native coronary artery without angina pectoris: Secondary | ICD-10-CM | POA: Insufficient documentation

## 2013-09-09 DIAGNOSIS — E785 Hyperlipidemia, unspecified: Secondary | ICD-10-CM | POA: Insufficient documentation

## 2013-09-09 DIAGNOSIS — N529 Male erectile dysfunction, unspecified: Secondary | ICD-10-CM | POA: Insufficient documentation

## 2013-09-09 DIAGNOSIS — I10 Essential (primary) hypertension: Secondary | ICD-10-CM | POA: Insufficient documentation

## 2013-09-09 DIAGNOSIS — I447 Left bundle-branch block, unspecified: Secondary | ICD-10-CM | POA: Insufficient documentation

## 2013-09-12 ENCOUNTER — Ambulatory Visit (INDEPENDENT_AMBULATORY_CARE_PROVIDER_SITE_OTHER): Payer: Medicare Other | Admitting: Interventional Cardiology

## 2013-09-12 ENCOUNTER — Encounter: Payer: Self-pay | Admitting: Interventional Cardiology

## 2013-09-12 VITALS — BP 134/62 | HR 63 | Ht 69.0 in | Wt 165.0 lb

## 2013-09-12 DIAGNOSIS — I10 Essential (primary) hypertension: Secondary | ICD-10-CM

## 2013-09-12 DIAGNOSIS — Z79899 Other long term (current) drug therapy: Secondary | ICD-10-CM

## 2013-09-12 DIAGNOSIS — I2510 Atherosclerotic heart disease of native coronary artery without angina pectoris: Secondary | ICD-10-CM

## 2013-09-12 DIAGNOSIS — E785 Hyperlipidemia, unspecified: Secondary | ICD-10-CM

## 2013-09-12 LAB — BASIC METABOLIC PANEL
CO2: 28 mEq/L (ref 19–32)
Calcium: 9.4 mg/dL (ref 8.4–10.5)
GFR: 78.43 mL/min (ref >60.00)
Sodium: 137 mEq/L (ref 135–145)

## 2013-09-12 NOTE — Progress Notes (Signed)
Patient ID: Parker Novel., male   DOB: October 11, 1932, 79 y.o.   MRN: [id number]    [address] [place], [place]  [address] Phone: [phone] Fax:  [phone]  Date:  09/12/2013   ID:  Parker Novel., DOB [birth year], MRN [id number]  PCP:   Duane Lope, MD      History of Present Illness: Parker Acree. is a 79 y.o. male who had an LAD stent in 1997. He has done well since that time. He walks a lot. He does some lifting at work. He does exercises in the nmorning without sx, for about 15 minutes daily and walks during the day.  No dedicated walking but stays active with stretching and regular activities.   CAD/ASCVD:  Denies : Chest pain.  Diaphoresis.  Dizziness.  Dyspnea on exertion.  Leg edema.  Nitroglycerin.  Orthopnea.  Palpitations.  Paroxysmal nocturnal dyspnea.  Shortness of breath.  Syncope.   Hernia surgery in 2013 went well.   Wt Readings from Last 3 Encounters:  09/12/13 165 lb (74.844 kg)  07/08/12 167 lb 2 oz (75.807 kg)  06/16/12 168 lb (76.204 kg)     Past Medical History  Diagnosis Date  . Hypertension   . ASCVD (arteriosclerotic cardiovascular disease)     single vessel  . Dyslipidemia   . ED (erectile dysfunction)   . Left bundle branch block     chronic  . Inguinal hernia   . Postsurgical percutaneous transluminal coronary angioplasty status   . Coronary atherosclerosis of native coronary artery     Current Outpatient Prescriptions  Medication Sig Dispense Refill  . aspirin 81 MG tablet Take 81 mg by mouth daily. Last dose prior to surgery 06/12/12      . atorvastatin (LIPITOR) 40 MG tablet Take 40 mg by mouth daily.      Marland Kitchen lisinopril-hydrochlorothiazide (PRINZIDE,ZESTORETIC) 20-12.5 MG per tablet Take 1 tablet by mouth daily.      . Lutein 20 MG CAPS Take 20 mg by mouth every other day.      . Lutein 6 MG CAPS Take 1 capsule by mouth daily.      . Multiple Vitamins-Minerals (CVS SPECTRAVITE PO) Take by mouth every  other day.      . omega-3 acid ethyl esters (LOVAZA) 1 G capsule Take 1-2 g by mouth 2 (two) times daily. Take 2 capsules in AM and 1 capsule in PM      . Polyethyl Glycol-Propyl Glycol (SYSTANE) 0.4-0.3 % SOLN Apply 1 drop to eye daily.       No current facility-administered medications for this visit.    Allergies:   No Known Allergies  Social History:  The patient  reports that he has never smoked. He has never used smokeless tobacco. He reports that he does not drink alcohol or use illicit drugs. Widowed.  Now lives in an apartment.  Someone bought his house before he could downsize.  No exrercise equipment at his apartment.  Family History:  The patient's family history includes Arthritis in his father.   ROS:  Please see the history of present illness.  No nausea, vomiting.  No fevers, chills.  No focal weakness.  No dysuria.    All other systems reviewed and negative.   PHYSICAL EXAM: VS:  BP 134/62  Pulse 63  Ht 5\' 9"  (1.753 m)  Wt 165 lb (74.844 kg)  BMI 24.36 kg/m2 Well nourished, well developed, in no acute distress  HEENT: normal Neck: no JVD, no carotid bruits Cardiac:  normal S1, S2; RRR;  Lungs:  clear to auscultation bilaterally, no wheezing, rhonchi or rales Abd: soft, nontender, no hepatomegaly Ext: no edema Skin: warm and dry Neuro:   no focal abnormalities noted  EKG:  NSR, LBBB, PACs, prolonged PR interval    ASSESSMENT AND PLAN:  Coronary atherosclerosis of native coronary artery  Continue Aspirin Tablet, 81 MG, 1 tablet, Orally, Once a day No angina. Recent stress test negative in 8/13.  Did well with hernia surgery in 2013. He needs to try to increase exercise to at least 150 minutes per week.  2. Essential hypertension, benign  Continue Lisinopril-Hydrochlorothiazide Tablet, 20-12.5 MG, 1 tablet, Orally, Once a day Controlled.  Electrolytes have not been checked since February 2013. We'll check today.  3. Pure hypercholesterolemia  Continue Lipitor  Tablet, 40 MG, TAKE 1 TABLET BY MOUTH EVERY DAY, Orally LDL particle < 1000. 684 in 3/14.  Lipids to be checked in March of 2015. Well-controlled at last check. He'll also continue fish oil at this time.   Signed, Fredric Mare, MD, [facility] 09/12/2013 9:29 AM

## 2013-09-12 NOTE — Patient Instructions (Signed)
Your physician wants you to follow-up in: 1 year with Dr. Eldridge Dace. You will receive a reminder letter in the mail two months in advance. If you don't receive a letter, please call our office to schedule the follow-up appointment.  Your physician recommends that you return for lab work today for [employer].  Your physician discussed the importance of regular exercise and recommended that you start or continue a regular exercise program for good health. Try to exercise 5 days a week for at least 30 minutes.

## 2013-11-19 ENCOUNTER — Other Ambulatory Visit: Payer: Self-pay | Admitting: *Deleted

## 2013-11-19 DIAGNOSIS — E7800 Pure hypercholesterolemia, unspecified: Secondary | ICD-10-CM

## 2013-11-19 DIAGNOSIS — Z79899 Other long term (current) drug therapy: Secondary | ICD-10-CM

## 2013-11-23 ENCOUNTER — Other Ambulatory Visit: Payer: Self-pay | Admitting: Interventional Cardiology

## 2014-01-04 ENCOUNTER — Other Ambulatory Visit (INDEPENDENT_AMBULATORY_CARE_PROVIDER_SITE_OTHER): Payer: Medicare Other | Admitting: *Deleted

## 2014-01-04 DIAGNOSIS — E7800 Pure hypercholesterolemia, unspecified: Secondary | ICD-10-CM

## 2014-01-04 DIAGNOSIS — Z79899 Other long term (current) drug therapy: Secondary | ICD-10-CM

## 2014-01-04 LAB — ALT: ALT: 31 U/L (ref 0–53)

## 2014-01-05 LAB — NMR LIPOPROFILE WITH LIPIDS
CHOLESTEROL, TOTAL: 110 mg/dL (ref <200)
HDL PARTICLE NUMBER: 27.7 umol/L — AB (ref >=30.5)
HDL Size: 8.9 nm — ABNORMAL LOW (ref >=9.2)
HDL-C: 40 mg/dL (ref >=40)
LARGE HDL: 4.7 umol/L — AB (ref >=4.8)
LARGE VLDL-P: 0.9 nmol/L (ref <=2.7)
LDL (calc): 56 mg/dL (ref <100)
LDL PARTICLE NUMBER: 743 nmol/L (ref <1000)
LDL Size: 20.4 nm — ABNORMAL LOW (ref >20.5)
LP-IR Score: 40 (ref <=45)
SMALL LDL PARTICLE NUMBER: 282 nmol/L (ref <=527)
TRIGLYCERIDES: 72 mg/dL (ref <150)
VLDL Size: 48.1 nm — ABNORMAL HIGH (ref <=46.6)

## 2014-01-08 ENCOUNTER — Telehealth: Payer: Self-pay | Admitting: Interventional Cardiology

## 2014-01-08 NOTE — Telephone Encounter (Signed)
Follow up ° ° ° ° °Returning a nurses call °

## 2014-01-08 NOTE — Telephone Encounter (Signed)
Returned pt's call.

## 2014-01-08 NOTE — Telephone Encounter (Signed)
New Message:  Pt is requesting a call back to hear his recent test results.Marland Kitchen

## 2014-01-12 ENCOUNTER — Other Ambulatory Visit: Payer: Self-pay | Admitting: Cardiology

## 2014-01-12 DIAGNOSIS — E785 Hyperlipidemia, unspecified: Secondary | ICD-10-CM

## 2014-02-01 ENCOUNTER — Other Ambulatory Visit: Payer: Self-pay

## 2014-02-01 MED ORDER — ATORVASTATIN CALCIUM 40 MG PO TABS: 40.0000 mg | ORAL_TABLET | Freq: Every day | ORAL | Status: DC

## 2014-03-07 ENCOUNTER — Ambulatory Visit (INDEPENDENT_AMBULATORY_CARE_PROVIDER_SITE_OTHER): Payer: Medicare Other | Admitting: *Deleted

## 2014-03-07 DIAGNOSIS — I10 Essential (primary) hypertension: Secondary | ICD-10-CM

## 2014-03-07 LAB — CBC WITH DIFFERENTIAL/PLATELET
BASOS ABS: 0 10*3/uL (ref 0.0–0.1)
Basophils Relative: 0.2 % (ref 0.0–3.0)
Eosinophils Absolute: 0.4 10*3/uL (ref 0.0–0.7)
Eosinophils Relative: 9.4 % — ABNORMAL HIGH (ref 0.0–5.0)
HEMATOCRIT: 37.2 % — AB (ref 39.0–52.0)
HEMOGLOBIN: 12.6 g/dL — AB (ref 13.0–17.0)
LYMPHS ABS: 1 10*3/uL (ref 0.7–4.0)
Lymphocytes Relative: 21.6 % (ref 12.0–46.0)
MCHC: 33.9 g/dL (ref 30.0–36.0)
MCV: 102.3 fl — AB (ref 78.0–100.0)
MONO ABS: 0.4 10*3/uL (ref 0.1–1.0)
MONOS PCT: 8 % (ref 3.0–12.0)
NEUTROS ABS: 2.9 10*3/uL (ref 1.4–7.7)
Neutrophils Relative %: 60.8 % (ref 43.0–77.0)
PLATELETS: 136 10*3/uL — AB (ref 150.0–400.0)
RBC: 3.64 Mil/uL — ABNORMAL LOW (ref 4.22–5.81)
RDW: 13.1 % (ref 11.5–15.5)
WBC: 4.8 10*3/uL (ref 4.0–10.5)

## 2014-03-07 LAB — BASIC METABOLIC PANEL
BUN: 10 mg/dL (ref 6–23)
CHLORIDE: 104 meq/L (ref 96–112)
CO2: 28 meq/L (ref 19–32)
Calcium: 9.1 mg/dL (ref 8.4–10.5)
Creatinine, Ser: 1 mg/dL (ref 0.4–1.5)
GFR: 80.22 mL/min (ref >60.00)
GLUCOSE: 167 mg/dL — AB (ref 70–99)
POTASSIUM: 3.4 meq/L — AB (ref 3.5–5.1)
SODIUM: 139 meq/L (ref 135–145)

## 2014-03-09 ENCOUNTER — Telehealth: Payer: Self-pay | Admitting: Cardiology

## 2014-03-09 DIAGNOSIS — E876 Hypokalemia: Secondary | ICD-10-CM

## 2014-03-09 MED ORDER — POTASSIUM CHLORIDE CRYS ER 20 MEQ PO TBCR: 20.0000 meq | EXTENDED_RELEASE_TABLET | Freq: Every day | ORAL | Status: DC

## 2014-03-09 NOTE — Telephone Encounter (Signed)
Message copied by Alcario Drought on Fri Mar 09, 2014  9:16 AM ------      Message from: Jettie Booze      Created: Thu Mar 08, 2014  6:11 PM       KCl 20 mEq daily, recheck BMet in a week. ------

## 2014-03-09 NOTE — Telephone Encounter (Signed)
Pt notified. Rx sent in and Bmet sch for 03/16/14.

## 2014-03-16 ENCOUNTER — Other Ambulatory Visit (INDEPENDENT_AMBULATORY_CARE_PROVIDER_SITE_OTHER): Payer: Medicare Other

## 2014-03-16 DIAGNOSIS — E876 Hypokalemia: Secondary | ICD-10-CM

## 2014-03-16 LAB — BASIC METABOLIC PANEL
BUN: 11 mg/dL (ref 6–23)
CALCIUM: 9.1 mg/dL (ref 8.4–10.5)
CO2: 27 meq/L (ref 19–32)
CREATININE: 1.1 mg/dL (ref 0.4–1.5)
Chloride: 101 mEq/L (ref 96–112)
GFR: 70.78 mL/min (ref >60.00)
GLUCOSE: 125 mg/dL — AB (ref 70–99)
Potassium: 4 mEq/L (ref 3.5–5.1)
SODIUM: 135 meq/L (ref 135–145)

## 2014-08-27 ENCOUNTER — Encounter: Payer: Self-pay | Admitting: Interventional Cardiology

## 2014-08-27 ENCOUNTER — Ambulatory Visit (INDEPENDENT_AMBULATORY_CARE_PROVIDER_SITE_OTHER): Payer: Medicare Other | Admitting: Interventional Cardiology

## 2014-08-27 VITALS — BP 140/72 | HR 64 | Ht 69.0 in | Wt 168.8 lb

## 2014-08-27 DIAGNOSIS — I10 Essential (primary) hypertension: Secondary | ICD-10-CM

## 2014-08-27 DIAGNOSIS — I2510 Atherosclerotic heart disease of native coronary artery without angina pectoris: Secondary | ICD-10-CM

## 2014-08-27 DIAGNOSIS — E785 Hyperlipidemia, unspecified: Secondary | ICD-10-CM

## 2014-08-27 DIAGNOSIS — E876 Hypokalemia: Secondary | ICD-10-CM

## 2014-08-27 NOTE — Progress Notes (Signed)
Patient ID: Parker Sink., male   DOB: May 15, 1932, 80 y.o.   MRN: [id number] Patient ID: Parker Shellhammer., male   DOB: [birth year], 80 y.o.   MRN: [id number]    [facility], [place], [place]  [address] Phone: [phone] Fax:  [phone]  Date:  08/27/2014   ID:  Parker Sink., DOB [birth year], MRN [id number]  PCP:   Melinda Crutch, MD      History of Present Illness: Parker Fendley. is a 80 y.o. male who had an LAD stent in 1997. He has done well since that time. He walks a lot. 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 He does exercises in the morning without sx, for less than 15 minutes daily and walks during the day.  Mostly stretching exercises of the arms and legs.   No dedicated walking but stays active with stretching and regular activities.   CAD/ASCVD:  Denies : Chest pain.  Diaphoresis.  Dizziness.  Dyspnea on exertion.  Leg edema.  Nitroglycerin.  Orthopnea.  Palpitations.  Paroxysmal nocturnal dyspnea.  Shortness of breath.  Syncope.   Hernia surgery in 2013 went well. No cardiac issues at that time. No ischemia by stress test at that time.   No new sx since his last visit.  He is not checking his BP at home.   Wt Readings from Last 3 Encounters:  08/27/14 168 lb 12.8 oz (76.567 kg)  09/12/13 165 lb (74.844 kg)  07/08/12 167 lb 2 oz (75.807 kg)     Past Medical History  Diagnosis Date  . Hypertension   . ASCVD (arteriosclerotic cardiovascular disease)     single vessel  . Dyslipidemia   . ED (erectile dysfunction)   . Left bundle branch block     chronic  . Inguinal hernia   . Postsurgical percutaneous transluminal coronary angioplasty status   . Coronary atherosclerosis of native coronary artery     Current Outpatient Prescriptions  Medication Sig Dispense Refill  . aspirin 81 MG tablet Take 81 mg by mouth daily. Last dose prior to surgery 06/12/12    . atorvastatin (LIPITOR) 40 MG tablet  Take 1 tablet (40 mg total) by mouth daily. 30 tablet 6  . lisinopril-hydrochlorothiazide (PRINZIDE,ZESTORETIC) 20-12.5 MG per tablet TAKE 1 TABLET EVERY DAY 30 tablet 10  . Lutein 20 MG CAPS Take 20 mg by mouth every other day.    . Lutein 6 MG CAPS Take 1 capsule by mouth daily.    . Multiple Vitamins-Minerals ([employer] SPECTRAVITE PO) Take by mouth every other day.    . Omega-3 Fatty Acids (FISH OIL) 1200 MG CAPS Take 2 capsules by mouth 2 (two) times daily at 8 am and 10 pm.    . Polyethyl Glycol-Propyl Glycol (SYSTANE) 0.4-0.3 % SOLN Apply 1 drop to eye daily.    . potassium chloride SA (K-DUR,KLOR-CON) 20 MEQ tablet Take 1 tablet (20 mEq total) by mouth daily. 30 tablet 6   No current facility-administered medications for this visit.    Allergies:   No Known Allergies  Social History:  The patient  reports that he has never smoked. He has never used smokeless tobacco. He reports that he does not drink alcohol or use illicit drugs. Widowed.  Now lives in an apartment.  Someone bought his house before he could downsize.  No exrercise equipment at his apartment.  Family History:  The  patient's family history includes Arthritis in his father.   ROS:  Please see the history of present illness.  No nausea, vomiting.  No fevers, chills.  No focal weakness.  No dysuria.    All other systems reviewed and negative.   PHYSICAL EXAM: VS:  BP 140/72 mmHg  Pulse 64  Ht 5\' 9"  (1.753 m)  Wt 168 lb 12.8 oz (76.567 kg)  BMI 24.92 kg/m2 Well nourished, well developed, in no acute distress HEENT: normal Neck: no JVD, no carotid bruits Cardiac:  normal S1, S2; RRR;  Lungs:  clear to auscultation bilaterally, no wheezing, rhonchi or rales Abd: soft, nontender, no hepatomegaly Ext: no edema2+ PT pulses bilaterally Skin: warm and dry Neuro:   no focal abnormalities noted Psych: normal affect  EKG:  NSR, LBBB, PACs, prolonged PR interval  - essentially unchanged from prior  ASSESSMENT AND  PLAN:  Coronary atherosclerosis of native coronary artery  Continue Aspirin Tablet, 81 MG, 1 tablet, Orally, Once a day No angina. Most Recent stress test negative in 8/13.  Did well with hernia surgery in 2013. He needs to try to increase exercise to at least 150 minutes per week.  Continue statin for secondary prevention as well.  Bare metal stent in 1997 to the LAD. Continue eating a healthy diet.  2. Essential hypertension, benign  Continue Lisinopril-Hydrochlorothiazide Tablet, 20-12.5 MG, 1 tablet, Orally, Once a day Controlled.  Electrolytes normal in 5/15.  Check BP once a month at the pharmacy.   3. Pure hypercholesterolemia  Continue Lipitor Tablet, 40 MG, TAKE 1 TABLET BY MOUTH EVERY DAY, Orally along with the same dose of fish oil. LDL particle < 1000. 684 in 3/14.  Lipids in March of 2015 LDL 56, TG 72. Well-controlled at last check. He'll also continue fish oil at this time.  Weight stable.  Diet is ok.  He makes breakfast-oatmeal, peanut butter, fruit; juice.   4. Hypokalemia: resolved with half a banana a day. Low in 5/15, better a week later.    Signed, Mina Marble, MD, [facility] 08/27/2014 10:24 AM

## 2014-08-27 NOTE — Patient Instructions (Signed)
Your physician wants you to follow-up in: 12 months with Dr Varanasi You will receive a reminder letter in the mail two months in advance. If you don't receive a letter, please call our office to schedule the follow-up appointment.  

## 2014-09-07 ENCOUNTER — Other Ambulatory Visit: Payer: Self-pay

## 2014-09-07 MED ORDER — ATORVASTATIN CALCIUM 40 MG PO TABS: 40.0000 mg | ORAL_TABLET | Freq: Every day | ORAL | Status: DC

## 2014-10-05 ENCOUNTER — Other Ambulatory Visit: Payer: Self-pay | Admitting: Interventional Cardiology

## 2014-10-19 ENCOUNTER — Other Ambulatory Visit: Payer: Self-pay | Admitting: Interventional Cardiology

## 2015-01-07 ENCOUNTER — Other Ambulatory Visit (INDEPENDENT_AMBULATORY_CARE_PROVIDER_SITE_OTHER): Payer: Medicare Other | Admitting: *Deleted

## 2015-01-07 DIAGNOSIS — I10 Essential (primary) hypertension: Secondary | ICD-10-CM

## 2015-01-07 DIAGNOSIS — E785 Hyperlipidemia, unspecified: Secondary | ICD-10-CM

## 2015-01-07 LAB — HEPATIC FUNCTION PANEL
ALK PHOS: 80 U/L (ref 39–117)
ALT: 20 U/L (ref 0–53)
AST: 22 U/L (ref 0–37)
Albumin: 3.8 g/dL (ref 3.5–5.2)
BILIRUBIN DIRECT: 0.3 mg/dL (ref 0.0–0.3)
TOTAL PROTEIN: 6.4 g/dL (ref 6.0–8.3)
Total Bilirubin: 1.3 mg/dL — ABNORMAL HIGH (ref 0.2–1.2)

## 2015-01-07 LAB — BASIC METABOLIC PANEL
BUN: 13 mg/dL (ref 6–23)
CO2: 25 mEq/L (ref 19–32)
Calcium: 9.3 mg/dL (ref 8.4–10.5)
Chloride: 107 mEq/L (ref 96–112)
Creatinine, Ser: 0.97 mg/dL (ref 0.40–1.50)
GFR: 79.1 mL/min (ref >60.00)
GLUCOSE: 114 mg/dL — AB (ref 70–99)
POTASSIUM: 4 meq/L (ref 3.5–5.1)
Sodium: 139 mEq/L (ref 135–145)

## 2015-01-07 LAB — LIPID PANEL
CHOLESTEROL: 92 mg/dL (ref 0–200)
HDL: 34.6 mg/dL — AB (ref >39.00)
LDL CALC: 43 mg/dL (ref 0–99)
NonHDL: 57.4
TRIGLYCERIDES: 72 mg/dL (ref 0.0–149.0)
Total CHOL/HDL Ratio: 3
VLDL: 14.4 mg/dL (ref 0.0–40.0)

## 2015-01-07 NOTE — Addendum Note (Signed)
Addended by: Eulis Foster on: 01/07/2015 10:45 AM   Modules accepted: Orders

## 2015-01-07 NOTE — Addendum Note (Signed)
Addended by: Eulis Foster on: 01/07/2015 10:20 AM   Modules accepted: Orders

## 2015-01-08 ENCOUNTER — Telehealth: Payer: Self-pay | Admitting: Interventional Cardiology

## 2015-01-08 NOTE — Telephone Encounter (Signed)
New Msg ° ° ° ° ° ° °Pt returning call. ° ° °Please call back. °

## 2015-01-08 NOTE — Telephone Encounter (Signed)
Informed pt of lab results. Pt verbalized understanding. 

## 2015-04-07 ENCOUNTER — Other Ambulatory Visit: Payer: Self-pay | Admitting: Interventional Cardiology

## 2015-06-11 ENCOUNTER — Encounter: Payer: Self-pay | Admitting: Interventional Cardiology

## 2015-09-07 ENCOUNTER — Other Ambulatory Visit: Payer: Self-pay | Admitting: Interventional Cardiology

## 2015-09-17 ENCOUNTER — Other Ambulatory Visit: Payer: Self-pay | Admitting: Interventional Cardiology

## 2015-09-19 ENCOUNTER — Ambulatory Visit (INDEPENDENT_AMBULATORY_CARE_PROVIDER_SITE_OTHER): Payer: Medicare Other | Admitting: Interventional Cardiology

## 2015-09-19 ENCOUNTER — Other Ambulatory Visit: Payer: Self-pay

## 2015-09-19 ENCOUNTER — Encounter: Payer: Self-pay | Admitting: Interventional Cardiology

## 2015-09-19 VITALS — BP 130/65 | HR 61 | Ht 69.0 in | Wt 162.0 lb

## 2015-09-19 DIAGNOSIS — I447 Left bundle-branch block, unspecified: Secondary | ICD-10-CM

## 2015-09-19 DIAGNOSIS — I2510 Atherosclerotic heart disease of native coronary artery without angina pectoris: Secondary | ICD-10-CM | POA: Diagnosis not present

## 2015-09-19 DIAGNOSIS — R0981 Nasal congestion: Secondary | ICD-10-CM

## 2015-09-19 DIAGNOSIS — E785 Hyperlipidemia, unspecified: Secondary | ICD-10-CM | POA: Diagnosis not present

## 2015-09-19 DIAGNOSIS — I10 Essential (primary) hypertension: Secondary | ICD-10-CM | POA: Diagnosis not present

## 2015-09-19 MED ORDER — LISINOPRIL-HYDROCHLOROTHIAZIDE 20-12.5 MG PO TABS: 1.0000 | ORAL_TABLET | Freq: Every day | ORAL | Status: DC

## 2015-09-19 NOTE — Patient Instructions (Signed)
**Note De-Identified Guila Owensby Obfuscation** Medication Instructions:  Same-no changes  Labwork: Lipids and LFTs in 3 months. Please do not eat or drink after midnight the night before labs are drawn.  Testing/Procedures: None  Follow-Up: Your physician wants you to follow-up in: 1 year. You will receive a reminder letter in the mail two months in advance. If you don't receive a letter, please call our office to schedule the follow-up appointment.      If you need a refill on your cardiac medications before your next appointment, please call your pharmacy.

## 2015-09-19 NOTE — Telephone Encounter (Signed)
**Note De-Identified Parker Odonnell Obfuscation** We will refill at his f/u today. Thanks.

## 2015-09-19 NOTE — Progress Notes (Signed)
Patient ID: Parker Sink., male   DOB: [birth year], 81 y.o.   MRN: [id number]     Cardiology Office Note   Date:  09/19/2015   ID:   Sink., DOB 1931-10-25, MRN [id number]  PCP:   Parker Crutch, MD    Chief Complaint  Patient presents with  . Hypertension     Wt Readings from Last 3 Encounters:  09/19/15 162 lb (73.483 kg)  08/27/14 168 lb 12.8 oz (76.567 kg)  09/12/13 165 lb (74.844 kg)       History of Present Illness: Parker Feist. is a 81 y.o. male  who had an LAD stent in 1997. He has done well since that time. He walks a lot. 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 He does exercises in the morning without sx, for less than 15 minutes daily and walks during the day. He now lives in an apartment after his wife passed away a few years ago.  Mostly stretching exercises of the arms and legs. CAD/ASCVD:  Denies : Chest pain.  Diaphoresis.  Dizziness.  Dyspnea on exertion.  Leg edema.  Nitroglycerin.  Orthopnea.  Palpitations.  Paroxysmal nocturnal dyspnea.  Shortness of breath.  Syncope.   Hernia surgery in 2013 went well. No cardiac issues at that time. No ischemia by stress test at that time.   No new sx since his last visit. He is not checking his BP at home.  At the drug store, BP is well controlled.     Past Medical History  Diagnosis Date  . Hypertension   . ASCVD (arteriosclerotic cardiovascular disease)     single vessel  . Dyslipidemia   . ED (erectile dysfunction)   . Left bundle branch block     chronic  . Inguinal hernia   . Postsurgical percutaneous transluminal coronary angioplasty status   . Coronary atherosclerosis of native coronary artery     Past Surgical History  Procedure Laterality Date  . Coronary stent placement  1997  . Cholecystectomy  1979  . Cardiac catheterization  1998  . Cardiovascular stress test  05/25/12  . Inguinal hernia repair  06/15/2012   Procedure: HERNIA REPAIR INGUINAL ADULT;  Surgeon: Parker Hector, MD;  Location: [facility];  Service: General;  Laterality: Bilateral;  Bilateral Inguinal Hernia Repair with Ultrapro Mesh     Current Outpatient Prescriptions  Medication Sig Dispense Refill  . aspirin 81 MG tablet Take 81 mg by mouth daily. Last dose prior to surgery 06/12/12    . atorvastatin (LIPITOR) 40 MG tablet TAKE 1 TABLET (40 MG TOTAL) BY MOUTH DAILY. 30 tablet 6  . KLOR-CON M20 20 MEQ tablet TAKE 1 TABLET BY MOUTH EVERY DAY 30 tablet 1  . lisinopril-hydrochlorothiazide (PRINZIDE,ZESTORETIC) 20-12.5 MG tablet Take 1 tablet by mouth daily. 30 tablet 10  . Lutein 20 MG CAPS Take 20 mg by mouth every other day.    . Lutein 6 MG CAPS Take 1 capsule by mouth daily.    . Multiple Vitamins-Minerals ([employer]S SPECTRAVITE PO) Take by mouth every other day.    . Omega-3 Fatty Acids (FISH OIL) 1200 MG CAPS Take 2 capsules by mouth 2 (two) times daily at 8 am and 10 pm.    . Polyethyl Glycol-Propyl Glycol (SYSTANE) 0.4-0.3 % SOLN Apply 1 drop to eye daily.     No current facility-administered medications for this visit.  Allergies:   Review of patient's allergies indicates no known allergies.    Social History:  The patient  reports that he has never smoked. He has never used smokeless tobacco. He reports that he does not drink alcohol or use illicit drugs.   Family History:  The patient's family history includes Arthritis in his father; Hypertension in his paternal uncle. There is no history of Heart attack or Stroke.    ROS:  Please see the history of present illness.   Otherwise, review of systems are positive for joint pains- particularly left knee area.   All other systems are reviewed and negative.    PHYSICAL EXAM: VS:  BP 130/65 mmHg  Pulse 61  Ht 5\' 9"  (1.753 m)  Wt 162 lb (73.483 kg)  BMI 23.91 kg/m2 , BMI Body mass index is 23.91 kg/(m^2). GEN: Well nourished, well developed, in no acute distress HEENT:  normal Neck: no JVD, carotid bruits, or masses Cardiac: RRR; no murmurs, rubs, or gallops,no edema  Respiratory:  clear to auscultation bilaterally, normal work of breathing GI: soft, nontender, nondistended, + BS MS: no deformity or atrophy Skin: warm and dry, no rash Neuro:  Strength and sensation are intact Psych: euthymic mood, full affect   EKG:   The ekg ordered today demonstrates NSR, LBBB   Recent Labs: 01/07/2015: ALT 20; BUN 13; Creatinine, Ser 0.97; Potassium 4.0; Sodium 139   Lipid Panel    Component Value Date/Time   CHOL 92 01/07/2015 1041   CHOL 110 01/04/2014 0822   TRIG 72.0 01/07/2015 1041   TRIG 72 01/04/2014 0822   HDL 34.60* 01/07/2015 1041   HDL 40 01/04/2014 0822   CHOLHDL 3 01/07/2015 1041   VLDL 14.4 01/07/2015 1041   LDLCALC 43 01/07/2015 1041   LDLCALC 56 01/04/2014 0822     Other studies Reviewed: Additional studies/ records that were reviewed today with results demonstrating: .   ASSESSMENT AND PLAN:  1. CAD: Continue Aspirin Tablet, 81 MG, 1 tablet, Orally, Once a day No angina. Most Recent stress test negative in 8/13 ( nuclear test) . Did well with hernia surgery in 2013. He needs to try to increase exercise to at least 150 minutes per week. Continue statin for secondary prevention as well. Bare metal stent in 1997 to the LAD. Continue eating a healthy diet. 2. HTN: Continue Lisinopril-Hydrochlorothiazide Tablet, 20-12.5 MG, 1 tablet, Orally, Once a day Controlled. Electrolytes normal in 5/15, 3/16. Check BP once a month at the pharmacy.Recheck labs in March 2017. 3. Hyperlipidemia: Continue Lipitor Tablet, 40 MG, TAKE 1 TABLET BY MOUTH EVERY DAY, Orally along with the same dose of fish oil. LDL particle < 1000. 684 in 3/14. Lipids in March of 2015 LDL 56, TG 72. Well-controlled at last check. He'll also continue fish oil at this time. Weight stable.I personally reviewed Lipids in 2016- well controlled.   4. Nasal congestion: May be  related to seasonal allergies.  He manages this and it is not severe.  WOuld not start any meds unless sx got more severe. 5. LBBB- chronic, this has been present for years.  Check labs in March 2017.  He has not followed up with PMD.  I recommended that he see Parker Odonnell for routine health care screening.     Current medicines are reviewed at length with the patient today.  The patient concerns regarding his medicines were addressed.  The following changes have been made:  No change  Labs/ tests ordered today include:  No orders of the defined types were placed in this encounter.    Recommend 150 minutes/week of aerobic exercise Low fat, low carb, high fiber diet recommended  Disposition:   FU in  1 year   Teresita Madura., MD  09/19/2015 10:20 AM    [facility] [place], [place], [place]  [address] Phone: [phone]; Fax: [phone]

## 2015-11-04 ENCOUNTER — Other Ambulatory Visit: Payer: Self-pay | Admitting: Interventional Cardiology

## 2015-11-11 ENCOUNTER — Other Ambulatory Visit: Payer: Self-pay | Admitting: Interventional Cardiology

## 2015-12-18 ENCOUNTER — Other Ambulatory Visit: Payer: Medicare Other

## 2015-12-19 ENCOUNTER — Other Ambulatory Visit (INDEPENDENT_AMBULATORY_CARE_PROVIDER_SITE_OTHER): Payer: Medicare Other | Admitting: *Deleted

## 2015-12-19 ENCOUNTER — Other Ambulatory Visit: Payer: Medicare Other

## 2015-12-19 DIAGNOSIS — E785 Hyperlipidemia, unspecified: Secondary | ICD-10-CM

## 2015-12-19 LAB — COMPREHENSIVE METABOLIC PANEL
ALBUMIN: 4.1 g/dL (ref 3.6–5.1)
ALT: 23 U/L (ref 9–46)
AST: 22 U/L (ref 10–35)
Alkaline Phosphatase: 78 U/L (ref 40–115)
BILIRUBIN TOTAL: 1.7 mg/dL — AB (ref 0.2–1.2)
BUN: 12 mg/dL (ref 7–25)
CO2: 27 mmol/L (ref 20–31)
CREATININE: 0.96 mg/dL (ref 0.70–1.11)
Calcium: 9.4 mg/dL (ref 8.6–10.3)
Chloride: 105 mmol/L (ref 98–110)
Glucose, Bld: 101 mg/dL — ABNORMAL HIGH (ref 65–99)
Potassium: 4.1 mmol/L (ref 3.5–5.3)
SODIUM: 141 mmol/L (ref 135–146)
TOTAL PROTEIN: 6.4 g/dL (ref 6.1–8.1)

## 2015-12-19 LAB — LIPID PANEL
CHOLESTEROL: 102 mg/dL — AB (ref 125–200)
HDL: 43 mg/dL (ref >=40)
LDL CALC: 48 mg/dL (ref <130)
TRIGLYCERIDES: 55 mg/dL (ref <150)
Total CHOL/HDL Ratio: 2.4 Ratio (ref <=5.0)
VLDL: 11 mg/dL (ref <30)

## 2015-12-19 NOTE — Addendum Note (Signed)
Addended by: Eulis Foster on: 12/19/2015 08:25 AM   Modules accepted: Orders

## 2016-08-24 ENCOUNTER — Other Ambulatory Visit: Payer: Self-pay | Admitting: Interventional Cardiology

## 2016-10-11 ENCOUNTER — Other Ambulatory Visit: Payer: Self-pay | Admitting: Interventional Cardiology

## 2016-11-20 ENCOUNTER — Other Ambulatory Visit: Payer: Self-pay | Admitting: Interventional Cardiology

## 2016-11-27 ENCOUNTER — Encounter: Payer: Self-pay | Admitting: Physician Assistant

## 2016-12-03 ENCOUNTER — Ambulatory Visit (INDEPENDENT_AMBULATORY_CARE_PROVIDER_SITE_OTHER): Payer: Medicare Other | Admitting: Physician Assistant

## 2016-12-03 ENCOUNTER — Encounter: Payer: Self-pay | Admitting: Physician Assistant

## 2016-12-03 VITALS — BP 150/78 | HR 73 | Ht 69.0 in | Wt 154.4 lb

## 2016-12-03 DIAGNOSIS — Z79899 Other long term (current) drug therapy: Secondary | ICD-10-CM

## 2016-12-03 DIAGNOSIS — I2510 Atherosclerotic heart disease of native coronary artery without angina pectoris: Secondary | ICD-10-CM

## 2016-12-03 DIAGNOSIS — E785 Hyperlipidemia, unspecified: Secondary | ICD-10-CM

## 2016-12-03 DIAGNOSIS — Z0000 Encounter for general adult medical examination without abnormal findings: Secondary | ICD-10-CM | POA: Diagnosis not present

## 2016-12-03 DIAGNOSIS — I10 Essential (primary) hypertension: Secondary | ICD-10-CM | POA: Diagnosis not present

## 2016-12-03 DIAGNOSIS — I447 Left bundle-branch block, unspecified: Secondary | ICD-10-CM

## 2016-12-03 NOTE — Progress Notes (Signed)
Cardiology Office Note    Date:  12/03/2016   ID:  Parker Sink., DOB 1932/01/13, MRN [id number]  PCP:  Parker Crutch, MD  Cardiologist: Dr. Irish Odonnell  Chief Complaint  Patient presents with  . Follow-up    History of Present Illness:  Parker Caraveo. is a 83 y.o. male with history of CAD status post BMS to the LAD in 1997, normal nuclear stress test 05/2012 LBBB, dyslipidemia and hypertension. Last saw Dr.Varanasi 09/19/15 and was doing well.  Patient exercises twice a day lifting weights, walks 2-3 miles/day total. Denies chest pain, dyspnea, dyspnea on exertion, dizziness, edema.    Past Medical History:  Diagnosis Date  . ASCVD (arteriosclerotic cardiovascular disease)    single vessel  . Coronary atherosclerosis of native coronary artery   . Dyslipidemia   . ED (erectile dysfunction)   . Hypertension   . Inguinal hernia   . Left bundle branch block    chronic  . Postsurgical percutaneous transluminal coronary angioplasty status     Past Surgical History:  Procedure Laterality Date  . CARDIAC CATHETERIZATION  1998  . CARDIOVASCULAR STRESS TEST  05/25/12  . CHOLECYSTECTOMY  1979  . [place]  . INGUINAL HERNIA REPAIR  06/15/2012   Procedure: HERNIA REPAIR INGUINAL ADULT;  Surgeon: Parker Hector, MD;  Location: [facility];  Service: General;  Laterality: Bilateral;  Bilateral Inguinal Hernia Repair with Ultrapro Mesh    Current Medications: Outpatient Medications Prior to Visit  Medication Sig Dispense Refill  . aspirin 81 MG tablet Take 81 mg by mouth daily. Last dose prior to surgery 06/12/12    . atorvastatin (LIPITOR) 40 MG tablet TAKE 1 TABLET (40 MG TOTAL) BY MOUTH DAILY. 30 tablet 2  . KLOR-CON M20 20 MEQ tablet TAKE 1 TABLET BY MOUTH EVERY DAY 30 tablet 11  . lisinopril-hydrochlorothiazide (PRINZIDE,ZESTORETIC) 20-12.5 MG tablet TAKE 1 TABLET BY MOUTH DAILY. 30 tablet 3  . Lutein 20 MG CAPS Take 20 mg by mouth every other day.    . Lutein  6 MG CAPS Take 1 capsule by mouth daily.    . Multiple Vitamins-Minerals (CVS SPECTRAVITE PO) Take by mouth every other day.    . Omega-3 Fatty Acids (FISH OIL) 1200 MG CAPS Take 2 capsules by mouth 2 (two) times daily at 8 am and 10 pm.    . Polyethyl Glycol-Propyl Glycol (SYSTANE) 0.4-0.3 % SOLN Apply 1 drop to eye daily.     No facility-administered medications prior to visit.      Allergies:   Patient has no known allergies.   Social History   Social History  . Marital status: Widowed    Spouse name: N/A  . Number of children: N/A  . Years of education: N/A   Social History Main Topics  . Smoking status: Never Smoker  . Smokeless tobacco: Never Used  . Alcohol use No  . Drug use: No  . Sexual activity: Not Asked   Other Topics Concern  . None   Social History Narrative  . None     Family History:  The patient's   family history includes Arthritis in his father; Hypertension in his paternal uncle.   ROS:   Please see the history of present illness.    Review of Systems  Constitution: Negative.  HENT: Negative.   Cardiovascular: Negative.   Respiratory: Negative.   Endocrine: Negative.   Hematologic/Lymphatic: Negative.   Musculoskeletal: Negative.   Gastrointestinal: Negative.  Genitourinary: Negative.   Neurological: Negative.    All other systems reviewed and are negative.   PHYSICAL EXAM:   VS:  BP (!) 150/78   Pulse 73   Ht 5\' 9"  (1.753 m)   Wt 154 lb 6.4 oz (70 kg)   BMI 22.80 kg/m   Physical Exam  GEN: Well nourished, well developed, in no acute distress  Neck: no JVD, carotid bruits, or masses Cardiac:RRR; 1/6 systolic murmur at the left sternal border, no rubs, or gallops  Respiratory:  clear to auscultation bilaterally, normal work of breathing GI: soft, nontender, nondistended, + BS Ext: without cyanosis, clubbing, or edema, Good distal pulses bilaterally Psych: euthymic mood, full affect  Wt Readings from Last 3 Encounters:  12/03/16  154 lb 6.4 oz (70 kg)  09/19/15 162 lb (73.5 kg)  08/27/14 168 lb 12.8 oz (76.6 kg)      Studies/Labs Reviewed:   EKG:  EKG is ordered today.  The ekg ordered today demonstrates Normal sinus rhythm with first-degree AV block, left bundle branch block and PVC, similar to last year's EKG  Recent Labs: 12/19/2015: ALT 23; BUN 12; Creat 0.96; Potassium 4.1; Sodium 141   Lipid Panel    Component Value Date/Time   CHOL 102 (L) 12/19/2015 0825   CHOL 110 01/04/2014 0822   TRIG 55 12/19/2015 0825   TRIG 72 01/04/2014 0822   HDL 43 12/19/2015 0825   HDL 40 01/04/2014 0822   CHOLHDL 2.4 12/19/2015 0825   VLDL 11 12/19/2015 0825   LDLCALC 48 12/19/2015 0825   LDLCALC 56 01/04/2014 0822    Additional studies/ records that were reviewed today include:  Labs reviewed from 12/2015 potassium 4.1 BUN and creatinine normal cholesterol 102 HDL 43 LDL 48    ASSESSMENT:    1. Atherosclerosis of native coronary artery of native heart without angina pectoris   2. Hypertension, unspecified type   3. Hyperlipidemia, unspecified hyperlipidemia type   4. Encounter for long-term (current) use of high-risk medication   5. Physical exam      PLAN:  In order of problems listed above:  CAD status post BMS to the LAD 1997 with normal nuclear stress test in 2013. Exercises regularly. Continue aspirin, Lipitor follow-up in 1 year.  Hypertension patient's blood pressure is up a little today. Had it checked last week and it was [id number] systolic. He follows a low-sodium diet. I've asked him to check it on occasion at home and call if it remains elevated.  Hyperlipidemia continue lipitor. Check fasting lipid panel and lft's  LBBB chronic       Medication Adjustments/Labs and Tests Ordered: Current medicines are reviewed at length with the patient today.  Concerns regarding medicines are outlined above.  Medication changes, Labs and Tests ordered today are listed in the Patient Instructions  below. Patient Instructions  Your physician recommends that you continue on your current medications as directed. Please refer to the Current Medication list given to you today.   Your physician recommends that you return for lab work in:  FASTING  CMET  [place] wants you to follow-up in: [place] will receive a reminder letter in the mail two months in advance. If you don't receive a letter, please call our office to schedule the follow-up appointment.    Sumner Boast, PA-C  12/03/2016 12:46 PM    [place], [place]  [address] Phone: [phone]; Fax: [phone]

## 2016-12-03 NOTE — Patient Instructions (Signed)
Your physician recommends that you continue on your current medications as directed. Please refer to the Current Medication list given to you today.   Your physician recommends that you return for lab work in:  FASTING  CMET  [place] wants you to follow-up in: [place] will receive a reminder letter in the mail two months in advance. If you don't receive a letter, please call our office to schedule the follow-up appointment.

## 2016-12-21 ENCOUNTER — Other Ambulatory Visit: Payer: Self-pay | Admitting: Cardiovascular Disease

## 2016-12-23 ENCOUNTER — Other Ambulatory Visit: Payer: Medicare Other | Admitting: *Deleted

## 2016-12-23 DIAGNOSIS — E785 Hyperlipidemia, unspecified: Secondary | ICD-10-CM

## 2016-12-23 DIAGNOSIS — Z0000 Encounter for general adult medical examination without abnormal findings: Secondary | ICD-10-CM

## 2016-12-23 DIAGNOSIS — Z79899 Other long term (current) drug therapy: Secondary | ICD-10-CM

## 2016-12-23 LAB — COMPREHENSIVE METABOLIC PANEL
ALBUMIN: 4.1 g/dL (ref 3.5–4.7)
ALK PHOS: 103 IU/L (ref 39–117)
ALT: 26 IU/L (ref 0–44)
AST: 22 IU/L (ref 0–40)
Albumin/Globulin Ratio: 1.7 (ref 1.2–2.2)
BILIRUBIN TOTAL: 1.5 mg/dL — AB (ref 0.0–1.2)
BUN / CREAT RATIO: 9 — AB (ref 10–24)
BUN: 8 mg/dL (ref 8–27)
CHLORIDE: 103 mmol/L (ref 96–106)
CO2: 23 mmol/L (ref 18–29)
CREATININE: 0.85 mg/dL (ref 0.76–1.27)
Calcium: 9.6 mg/dL (ref 8.6–10.2)
GFR calc Af Amer: 94 mL/min/{1.73_m2} (ref >59)
GFR calc non Af Amer: 81 mL/min/{1.73_m2} (ref >59)
GLUCOSE: 107 mg/dL — AB (ref 65–99)
Globulin, Total: 2.4 g/dL (ref 1.5–4.5)
Potassium: 4.9 mmol/L (ref 3.5–5.2)
Sodium: 141 mmol/L (ref 134–144)
Total Protein: 6.5 g/dL (ref 6.0–8.5)

## 2016-12-23 LAB — LIPID PANEL
CHOLESTEROL TOTAL: 104 mg/dL (ref 100–199)
Chol/HDL Ratio: 2.2 ratio units (ref 0.0–5.0)
HDL: 47 mg/dL (ref >39)
LDL CALC: 45 mg/dL (ref 0–99)
TRIGLYCERIDES: 62 mg/dL (ref 0–149)
VLDL CHOLESTEROL CAL: 12 mg/dL (ref 5–40)

## 2016-12-23 LAB — CBC WITH DIFFERENTIAL/PLATELET
BASOS: 0 %
Basophils Absolute: 0 10*3/uL (ref 0.0–0.2)
EOS (ABSOLUTE): 0.4 10*3/uL (ref 0.0–0.4)
EOS: 8 %
HEMATOCRIT: 37.7 % (ref 37.5–51.0)
Hemoglobin: 13.1 g/dL (ref 13.0–17.7)
IMMATURE GRANS (ABS): 0 10*3/uL (ref 0.0–0.1)
IMMATURE GRANULOCYTES: 0 %
Lymphocytes Absolute: 1.2 10*3/uL (ref 0.7–3.1)
Lymphs: 23 %
MCH: 33.4 pg — ABNORMAL HIGH (ref 26.6–33.0)
MCHC: 34.7 g/dL (ref 31.5–35.7)
MCV: 96 fL (ref 79–97)
MONOS ABS: 0.6 10*3/uL (ref 0.1–0.9)
Monocytes: 11 %
NEUTROS ABS: 3 10*3/uL (ref 1.4–7.0)
NEUTROS PCT: 58 %
Platelets: 129 10*3/uL — ABNORMAL LOW (ref 150–379)
RBC: 3.92 x10E6/uL — ABNORMAL LOW (ref 4.14–5.80)
RDW: 13.1 % (ref 12.3–15.4)
WBC: 5.2 10*3/uL (ref 3.4–10.8)

## 2017-01-10 ENCOUNTER — Other Ambulatory Visit: Payer: Self-pay | Admitting: Interventional Cardiology

## 2017-07-31 ENCOUNTER — Other Ambulatory Visit: Payer: Self-pay | Admitting: Interventional Cardiology

## 2017-10-18 ENCOUNTER — Other Ambulatory Visit: Payer: Self-pay | Admitting: Interventional Cardiology

## 2017-10-18 MED ORDER — LISINOPRIL-HYDROCHLOROTHIAZIDE 20-12.5 MG PO TABS: 1.0000 | ORAL_TABLET | Freq: Every day | ORAL | 1 refills | Status: DC

## 2017-10-21 ENCOUNTER — Other Ambulatory Visit: Payer: Self-pay | Admitting: Cardiovascular Disease

## 2017-11-24 ENCOUNTER — Other Ambulatory Visit: Payer: Self-pay | Admitting: Interventional Cardiology

## 2017-11-30 ENCOUNTER — Other Ambulatory Visit: Payer: Self-pay | Admitting: Interventional Cardiology

## 2017-12-07 ENCOUNTER — Encounter: Payer: Self-pay | Admitting: Interventional Cardiology

## 2017-12-07 ENCOUNTER — Ambulatory Visit (INDEPENDENT_AMBULATORY_CARE_PROVIDER_SITE_OTHER): Payer: Medicare Other | Admitting: Interventional Cardiology

## 2017-12-07 ENCOUNTER — Encounter (INDEPENDENT_AMBULATORY_CARE_PROVIDER_SITE_OTHER): Payer: Self-pay

## 2017-12-07 VITALS — BP 140/82 | HR 80 | Ht 69.0 in | Wt 150.0 lb

## 2017-12-07 DIAGNOSIS — I447 Left bundle-branch block, unspecified: Secondary | ICD-10-CM | POA: Diagnosis not present

## 2017-12-07 DIAGNOSIS — E785 Hyperlipidemia, unspecified: Secondary | ICD-10-CM | POA: Diagnosis not present

## 2017-12-07 DIAGNOSIS — I2510 Atherosclerotic heart disease of native coronary artery without angina pectoris: Secondary | ICD-10-CM | POA: Diagnosis not present

## 2017-12-07 DIAGNOSIS — I10 Essential (primary) hypertension: Secondary | ICD-10-CM | POA: Diagnosis not present

## 2017-12-07 DIAGNOSIS — I4892 Unspecified atrial flutter: Secondary | ICD-10-CM

## 2017-12-07 MED ORDER — APIXABAN 5 MG PO TABS: 5.0000 mg | ORAL_TABLET | Freq: Two times a day (BID) | ORAL | 11 refills | Status: DC

## 2017-12-07 NOTE — Patient Instructions (Signed)
Medication Instructions:  Your physician has recommended you make the following change in your medication:   1. STOP: Aspirin  2. START: Eliquis 5 mg twice a day   Labwork: TODAY: BMET, CBC  Testing/Procedures: Your physician has requested that you have an echocardiogram in 4-6 weeks. Echocardiography is a painless test that uses sound waves to create images of your heart. It provides your doctor with information about the size and shape of your heart and how well your heart's chambers and valves are working. This procedure takes approximately one hour. There are no restrictions for this procedure.    Follow-Up: Your physician recommends that you schedule a follow-up appointment in: 4-6 weeks with APP on Dr. Hassell Done team   Any Other Special Instructions Will Be Listed Below (If Applicable).     If you need a refill on your cardiac medications before your next appointment, please call your pharmacy.Marland Kitchen

## 2017-12-07 NOTE — Progress Notes (Signed)
Cardiology Office Note   Date:  12/07/2017   ID:  Parker Sink., DOB 1932/01/04, MRN [id number]  PCP:  Lawerance Cruel, MD    No chief complaint on file.  CAD  Wt Readings from Last 3 Encounters:  12/07/17 150 lb (68 kg)  12/03/16 154 lb 6.4 oz (70 kg)  09/19/15 162 lb (73.5 kg)       History of Present Illness: Parker Outten. is a 84 y.o. male  who had an LAD stent in 1997. He has done well since that time. He walks a lot. 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 He now lives in an apartment after his wife passed away a few years ago.   Hernia surgery in 2013 went well. No cardiac issues at that time. No ischemia by stress test at that time.   He was noted to have an irregular heart beat in early 2019 with his PMD.  He was referred here to evaluate.  Denies : Chest pain. Dizziness. Leg edema. Nitroglycerin use. Orthopnea. Palpitations. Paroxysmal nocturnal dyspnea. Shortness of breath. Syncope.     Past Medical History:  Diagnosis Date  . ASCVD (arteriosclerotic cardiovascular disease)    single vessel  . Coronary atherosclerosis of native coronary artery   . Dyslipidemia   . ED (erectile dysfunction)   . Hypertension   . Inguinal hernia   . Left bundle branch block    chronic  . Postsurgical percutaneous transluminal coronary angioplasty status     Past Surgical History:  Procedure Laterality Date  . CARDIAC CATHETERIZATION  1998  . CARDIOVASCULAR STRESS TEST  05/25/12  . CHOLECYSTECTOMY  1979  . [place]  . INGUINAL HERNIA REPAIR  06/15/2012   Procedure: HERNIA REPAIR INGUINAL ADULT;  Surgeon: Adin Hector, MD;  Location: [facility];  Service: General;  Laterality: Bilateral;  Bilateral Inguinal Hernia Repair with Ultrapro Mesh     Current Outpatient Medications  Medication Sig Dispense Refill  . aspirin 81 MG tablet Take 81 mg by mouth daily. Last dose prior to  surgery 06/12/12    . atorvastatin (LIPITOR) 40 MG tablet TAKE 1 TABLET (40 MG TOTAL) BY MOUTH DAILY. 30 tablet 11  . lisinopril-hydrochlorothiazide (PRINZIDE,ZESTORETIC) 20-12.5 MG tablet Take 1 tablet by mouth daily. Please make yearly appt with Dr. Irish Lack for February before anymore refills. 1st attempt 30 tablet 1  . Lutein 20 MG CAPS Take 20 mg by mouth every other day.    . Lutein 6 MG CAPS Take 1 capsule by mouth daily.    . Multiple Vitamins-Minerals ([facility] SPECTRAVITE PO) Take by mouth every other day.    . Omega-3 Fatty Acids (FISH OIL) 1200 MG CAPS Take 2 capsules by mouth 2 (two) times daily at 8 am and 10 pm.    . Polyethyl Glycol-Propyl Glycol (SYSTANE) 0.4-0.3 % SOLN Apply 1 drop to eye daily.    . potassium chloride SA (KLOR-CON M20) 20 MEQ tablet Take 1 tablet (20 mEq total) by mouth daily. Please keep upcoming appt with Dr. Irish Lack 60 tablet 0   No current facility-administered medications for this visit.     Allergies:   Patient has no known allergies.    Social History:  The patient  reports that  has never smoked. he has never used smokeless tobacco. He reports that he does not drink alcohol or use drugs.  Family History:  The patient's family history includes Arthritis in his father; Hypertension in his paternal uncle.    ROS:  Please see the history of present illness.   Otherwise, review of systems are positive for postural imbalance, uses a cane.   All other systems are reviewed and negative.    PHYSICAL EXAM: VS:  BP 140/82   Pulse 80   Ht 5\' 9"  (1.753 m)   Wt 150 lb (68 kg)   SpO2 98%   BMI 22.15 kg/m  , BMI Body mass index is 22.15 kg/m. GEN: Well nourished, well developed, in no acute distress  HEENT: normal  Neck: no JVD, carotid bruits, or masses Cardiac: irregularly irregular; no murmurs, rubs, or gallops, 1+ bilateral pretibial edema  Respiratory:  clear to auscultation bilaterally, normal work of breathing GI: soft, nontender, nondistended, +  BS MS: no deformity or atrophy  Skin: warm and dry, no rash Neuro:  Strength and sensation are intact Psych: euthymic mood, full affect   EKG:   The ekg ordered today demonstrates atrial flutter with variable AV block, Left bundle block   Recent Labs: 12/23/2016: ALT 26; BUN 8; Creatinine, Ser 0.85; Hemoglobin 13.1; Platelets 129; Potassium 4.9; Sodium 141   Lipid Panel    Component Value Date/Time   CHOL 104 12/23/2016 0804   CHOL 110 01/04/2014 0822   TRIG 62 12/23/2016 0804   TRIG 72 01/04/2014 0822   HDL 47 12/23/2016 0804   HDL 40 01/04/2014 0822   CHOLHDL 2.2 12/23/2016 0804   CHOLHDL 2.4 12/19/2015 0825   VLDL 11 12/19/2015 0825   LDLCALC 45 12/23/2016 0804   LDLCALC 56 01/04/2014 0822     Other studies Reviewed: Additional studies/ records that were reviewed today with results demonstrating: Prior ECGs reviewed, he was in normal sinus rhythm in 2018..  2004 nuclear stress test showed no ischenua, EF 53%.   ASSESSMENT AND PLAN:  1. CAD: No angina. Contiue aggressive secondary prevention.  Stop aspirin since we are starting Eliquis. 2. Atrial flutter: Plan to start Eliquis.  Prior ECG is more questionable as to whether it is AFib or flutter.  Rate controlled. Check echocardiogram to evaluate for structural heart disease. 3. HTN: BP controlled.  4. Hyperlipidemia: Lipids well controlled in March 2018.  LDL 45 at that time. 5. LBBB: Chronic.   Current medicines are reviewed at length with the patient today.  The patient concerns regarding his medicines were addressed.  The following changes have been made: Start Eliquis for stroke prevention.  Labs/ tests ordered today include: CBC, electrolytes to assure that dose of Eliquis 5 mg twice daily as correct, normal renal function in 2018. No orders of the defined types were placed in this encounter.   Recommend 150 minutes/week of aerobic exercise Low fat, low carb, high fiber diet recommended  Disposition:   FU in  4-6 weeks   Signed, Larae Grooms, MD  12/07/2017 1:51 PM    [facility] [address], [place], [place]  [address] Phone: [phone]; Fax: [phone]

## 2017-12-08 LAB — BASIC METABOLIC PANEL
BUN/Creatinine Ratio: 12 (ref 10–24)
BUN: 10 mg/dL (ref 8–27)
CALCIUM: 9.2 mg/dL (ref 8.6–10.2)
CO2: 23 mmol/L (ref 20–29)
CREATININE: 0.81 mg/dL (ref 0.76–1.27)
Chloride: 101 mmol/L (ref 96–106)
GFR, EST AFRICAN AMERICAN: 95 mL/min/{1.73_m2} (ref >59)
GFR, EST NON AFRICAN AMERICAN: 82 mL/min/{1.73_m2} (ref >59)
Glucose: 102 mg/dL — ABNORMAL HIGH (ref 65–99)
POTASSIUM: 4.3 mmol/L (ref 3.5–5.2)
Sodium: 138 mmol/L (ref 134–144)

## 2017-12-08 LAB — CBC
HEMATOCRIT: 37 % — AB (ref 37.5–51.0)
HEMOGLOBIN: 12.4 g/dL — AB (ref 13.0–17.7)
MCH: 32.2 pg (ref 26.6–33.0)
MCHC: 33.5 g/dL (ref 31.5–35.7)
MCV: 96 fL (ref 79–97)
Platelets: 159 10*3/uL (ref 150–379)
RBC: 3.85 x10E6/uL — AB (ref 4.14–5.80)
RDW: 14.2 % (ref 12.3–15.4)
WBC: 5 10*3/uL (ref 3.4–10.8)

## 2017-12-19 ENCOUNTER — Other Ambulatory Visit: Payer: Self-pay | Admitting: Cardiovascular Disease

## 2017-12-23 ENCOUNTER — Other Ambulatory Visit: Payer: Self-pay | Admitting: Interventional Cardiology

## 2018-01-11 ENCOUNTER — Other Ambulatory Visit: Payer: Self-pay

## 2018-01-11 ENCOUNTER — Ambulatory Visit (HOSPITAL_COMMUNITY): Payer: Medicare Other | Attending: Cardiology

## 2018-01-11 ENCOUNTER — Encounter (INDEPENDENT_AMBULATORY_CARE_PROVIDER_SITE_OTHER): Payer: Self-pay

## 2018-01-11 DIAGNOSIS — R9439 Abnormal result of other cardiovascular function study: Secondary | ICD-10-CM | POA: Diagnosis not present

## 2018-01-11 DIAGNOSIS — I4892 Unspecified atrial flutter: Secondary | ICD-10-CM | POA: Diagnosis present

## 2018-01-11 DIAGNOSIS — I420 Dilated cardiomyopathy: Secondary | ICD-10-CM | POA: Insufficient documentation

## 2018-01-11 DIAGNOSIS — I081 Rheumatic disorders of both mitral and tricuspid valves: Secondary | ICD-10-CM | POA: Insufficient documentation

## 2018-01-13 ENCOUNTER — Other Ambulatory Visit: Payer: Self-pay

## 2018-01-13 MED ORDER — CARVEDILOL 3.125 MG PO TABS: 3.1250 mg | ORAL_TABLET | Freq: Two times a day (BID) | ORAL | 3 refills | Status: DC

## 2018-01-20 NOTE — Progress Notes (Signed)
Cardiology Office Note   Date:  01/21/2018   ID:  Parker Sink., DOB 11-27-1931, MRN [id number]  PCP:  Lawerance Cruel, MD    No chief complaint on file.  CAD  Wt Readings from Last 3 Encounters:  01/21/18 148 lb (67.1 kg)  12/07/17 150 lb (68 kg)  12/03/16 154 lb 6.4 oz (70 kg)       History of Present Illness: Parker Eslick. is a 84 y.o. male   who had an LAD stent in 1997.   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 He now lives in an apartment after his wife passed away a few years ago.   Hernia surgery in 2013 went well. No cardiac issues at that time. No ischemia by stress test at that time.   He was noted to have an irregular heart beat in early 2019 with his PMD.  He was referred here to evaluate.  In March 2019, echo:  Left ventricle: The cavity size was normal. Wall thickness was   increased in a pattern of mild LVH. There was mild concentric   hypertrophy. Systolic function was moderately reduced. The   estimated ejection fraction was in the range of 35% to 40%.   Moderate diffuse hypokinesis with regional variations. There   appears to be disproportionate hypokinesis of the apex and the   inferolateral wall. ; in the distribution of multiple vessels. No   evidence of thrombus. - Ventricular septum: Septal motion showed abnormal function,   dyssynergy, and paradox. - Mitral valve: There was mild regurgitation. - Left atrium: The atrium was moderately to severely dilated. - Right ventricle: The cavity size was mildly dilated. - Right atrium: The atrium was moderately to severely dilated. - Atrial septum: No defect or patent foramen ovale was identified. - Tricuspid valve: There was mild-moderate regurgitation directed   centrally. - Pulmonary arteries: PA peak pressure: 42 mm Hg (S).   Past Medical History:  Diagnosis Date  . ASCVD (arteriosclerotic cardiovascular disease)    single vessel  .  Coronary atherosclerosis of native coronary artery   . Dyslipidemia   . ED (erectile dysfunction)   . Hypertension   . Inguinal hernia   . Left bundle branch block    chronic  . Postsurgical percutaneous transluminal coronary angioplasty status     Past Surgical History:  Procedure Laterality Date  . CARDIAC CATHETERIZATION  1998  . CARDIOVASCULAR STRESS TEST  05/25/12  . CHOLECYSTECTOMY  1979  . [place]  . INGUINAL HERNIA REPAIR  06/15/2012   Procedure: HERNIA REPAIR INGUINAL ADULT;  Surgeon: Adin Hector, MD;  Location: [facility];  Service: General;  Laterality: Bilateral;  Bilateral Inguinal Hernia Repair with Ultrapro Mesh     Current Outpatient Medications  Medication Sig Dispense Refill  . acetaminophen (TYLENOL) 500 MG tablet Take 500 mg by mouth every 6 (six) hours as needed for mild pain, moderate pain or headache.    Marland Kitchen apixaban (ELIQUIS) 5 MG TABS tablet Take 1 tablet (5 mg total) by mouth 2 (two) times daily. 60 tablet 11  . atorvastatin (LIPITOR) 40 MG tablet TAKE 1 TABLET (40 MG TOTAL) BY MOUTH DAILY. 30 tablet 11  . carvedilol (COREG) 3.125 MG tablet Take 1 tablet (3.125 mg total) by mouth 2 (two) times daily. 180 tablet 3  . lisinopril-hydrochlorothiazide (PRINZIDE,ZESTORETIC) 20-12.5 MG tablet TAKE 1 TABLET BY  MOUTH DAILY. 90 tablet 3  . Lutein 20 MG CAPS Take 20 mg by mouth every other day.    . Lutein 6 MG CAPS Take 1 capsule by mouth daily.    . Multiple Vitamins-Minerals ([facility]S SPECTRAVITE PO) Take by mouth every other day.    . Omega-3 Fatty Acids (FISH OIL) 1200 MG CAPS Take 2 capsules by mouth 2 (two) times daily at 8 am and 10 pm.    . Polyethyl Glycol-Propyl Glycol (SYSTANE) 0.4-0.3 % SOLN Apply 1 drop to eye 2 (two) times daily.     . potassium chloride SA (KLOR-CON M20) 20 MEQ tablet Take 1 tablet (20 mEq total) by mouth daily. Please keep upcoming appt with Dr. Irish Lack 60 tablet 0   No current facility-administered medications for this  visit.     Allergies:   Patient has no known allergies.    Social History:  The patient  reports that he has never smoked. He has never used smokeless tobacco. He reports that he does not drink alcohol or use drugs.   Family History:  The patient's family history includes Arthritis in his father; Hypertension in his paternal uncle.    ROS:  Please see the history of present illness.   Otherwise, review of systems are positive for unsteadiness.   All other systems are reviewed and negative.    PHYSICAL EXAM: VS:  BP 128/80   Pulse 69   Ht 5\' 9"  (1.753 m)   Wt 148 lb (67.1 kg)   SpO2 97%   BMI 21.86 kg/m  , BMI Body mass index is 21.86 kg/m. GEN: Well nourished, well developed, in no acute distress  HEENT: normal  Neck: no JVD, carotid bruits, or masses Cardiac: RRR, premature beats; no murmurs, rubs, or gallops,no edema  Respiratory:  clear to auscultation bilaterally, normal work of breathing GI: soft, nontender, nondistended, + BS MS: no deformity or atrophy  Skin: warm and dry, no rash Neuro:  Strength and sensation are intact Psych: euthymic mood, full affect      Recent Labs: 12/07/2017: BUN 10; Creatinine, Ser 0.81; Hemoglobin 12.4; Platelets 159; Potassium 4.3; Sodium 138   Lipid Panel    Component Value Date/Time   CHOL 104 12/23/2016 0804   CHOL 110 01/04/2014 0822   TRIG 62 12/23/2016 0804   TRIG 72 01/04/2014 0822   HDL 47 12/23/2016 0804   HDL 40 01/04/2014 0822   CHOLHDL 2.2 12/23/2016 0804   CHOLHDL 2.4 12/19/2015 0825   VLDL 11 12/19/2015 0825   LDLCALC 45 12/23/2016 0804   LDLCALC 56 01/04/2014 0822     Other studies Reviewed: Additional studies/ records that were reviewed today with results demonstrating: Labs from March 2018 reviewed.   ASSESSMENT AND PLAN:  1. CAD: No angina.  Continue aggressive secondary prevention.  He will need his lipids rechecked when he gets a physical. 2. Atrial flutter/AFib: Eliquis for anticoagulation/stroke  prevention.  He appears to be in sinus today.  No symptoms from his arrhythmia.  No bleeding problems.  He needs to avoid falling. 3. HTN: The current medical regimen is effective;  continue present plan and medications. 4. Hyperlipidemia: LDL controlled in 2018.  This will need to be rechecked.  Continue lipid-lowering therapy with atorvastatin 40 mg daily  5. LBBB: Chronic.   Current medicines are reviewed at length with the patient today.  The patient concerns regarding his medicines were addressed.  The following changes have been made:  No change  Labs/ tests ordered  today include:  No orders of the defined types were placed in this encounter.   Recommend 150 minutes/week of aerobic exercise Low fat, low carb, high fiber diet recommended  Disposition:   FU in 6 months   Signed, Larae Grooms, MD  01/21/2018 2:01 PM    [facility] [address], [place], [place]  [address] Phone: [phone]; Fax: [phone]

## 2018-01-21 ENCOUNTER — Ambulatory Visit (INDEPENDENT_AMBULATORY_CARE_PROVIDER_SITE_OTHER): Payer: Medicare Other | Admitting: Interventional Cardiology

## 2018-01-21 ENCOUNTER — Encounter: Payer: Self-pay | Admitting: Interventional Cardiology

## 2018-01-21 VITALS — BP 128/80 | HR 69 | Ht 69.0 in | Wt 148.0 lb

## 2018-01-21 DIAGNOSIS — I447 Left bundle-branch block, unspecified: Secondary | ICD-10-CM

## 2018-01-21 DIAGNOSIS — I10 Essential (primary) hypertension: Secondary | ICD-10-CM

## 2018-01-21 DIAGNOSIS — I4892 Unspecified atrial flutter: Secondary | ICD-10-CM | POA: Diagnosis not present

## 2018-01-21 DIAGNOSIS — I2510 Atherosclerotic heart disease of native coronary artery without angina pectoris: Secondary | ICD-10-CM

## 2018-01-21 DIAGNOSIS — E785 Hyperlipidemia, unspecified: Secondary | ICD-10-CM

## 2018-01-21 NOTE — Patient Instructions (Signed)

## 2018-01-26 ENCOUNTER — Other Ambulatory Visit: Payer: Self-pay | Admitting: Interventional Cardiology

## 2018-02-03 ENCOUNTER — Other Ambulatory Visit: Payer: Self-pay | Admitting: Interventional Cardiology

## 2018-05-24 ENCOUNTER — Telehealth: Payer: Self-pay | Admitting: Interventional Cardiology

## 2018-05-24 NOTE — Telephone Encounter (Signed)
Returned call to patient's daughter (DPR on file) who states that she noticed that the patient had swelling in his legs yesterday and that they look tight. She states that she is not sure how long they have been this way because the patient always wears pants. She states that the swelling is non-pitting. She states that the patient is not having any SOB or leg pain. She states that the patient does not have a scale and does not weigh himself. She states that the patient lives at [facility] and that they prepare his meals for him. She states that they are low sodium meals. She states that the patient receives PT daily. Daughter states that the PT stated that his BP was running on the higher side but she was not sure what it had been running. Daughter is also not sure what medicines he has been taking. Daughter states that she is going to call and speak to PT and see what his BP has been running and verify that he has been taking carvedilol 3.125 mg BID and lisinopril-HCTZ 20-12.5 mg QD. Daughter states that she will call back with the information. Instructed for the patient to avoid salt and elevate legs in the meantime.

## 2018-05-24 NOTE — Telephone Encounter (Signed)
New Message    Pt c/o swelling: STAT is pt has developed SOB within 24 hours  1) How much weight have you gained and in what time span? unknown  2) If swelling, where is the swelling located? Knees to his ankles she described as tight   3) Are you currently taking a fluid pill? No  4) Are you currently SOB? no  5) Do you have a log of your daily weights (if so, list)? unknown  6) Have you gained 3 pounds in a day or 5 pounds in a week? unknown  7) Have you traveled recently? no     Patients daughter is calling on behalf. She indicates that she noticed significantly swelling from the patients knees to his ankle. As well the skin look very tight.

## 2018-05-30 NOTE — Telephone Encounter (Signed)
Parker Odonnell left a msg on the refill vm stating that when she reviewed patients current medications with you last week, you didn't mention eliquis. She is wanting to know if the patient should still be taking this medication. [name] requested a call back at [phone] to discuss. Thanks, MI

## 2018-05-30 NOTE — Telephone Encounter (Signed)
Returned call to daughter. Made her aware that the patient should be taking his Eliquis 5 mg BID. Daughter also calling to report that his leg swelling is better and that he saw his PCP last Friday and his PCP started him on HCTZ 12.5 mg QD in addition to his lisinopril-HCTZ 20-12.5 mg QD. Patient's BP was 136/80. Patient will continue to monitor BP and let us know if his Sx change or worsens before his appointment in September.

## 2018-06-01 ENCOUNTER — Telehealth: Payer: Self-pay | Admitting: *Deleted

## 2018-06-01 NOTE — Telephone Encounter (Signed)
Richarda Blade left a msg on the refill vm stating that the patient will run out of lisinopril/hctz on Saturday and per [facility] it cannot be [facility]illed until September 7. She isn't sure why he ran out unless he has been taking two a day but she doesn't think that would be as his bp has been elevated. Today it was 172/93 and 150/90. She would like a call back at [phone] Thanks, MI

## 2018-06-02 NOTE — Telephone Encounter (Signed)
Called and spoke to [name] at [facility] [address]. [name] states that the patient's insurance would cover a refill for his lisinopril-HCTZ on 8/19. [name] states that if the patient did not have enough pills to last until then that she will be glad to give the patient 5 pills to last until then.   Called and made patient's daughter [name] aware of the information above. She verbalized understanding and thanked me for the call.

## 2018-06-02 NOTE — Telephone Encounter (Signed)
Patient's daughter [name] calling back and states that [facility] that the patient cannot get a refill on his lisinopril-HCTZ until 9/7. She states that she is starting to be more involved in his care and went to fill out his pill box and noticed that his Rx was low. She states that the patient told her that he has only been taking 1 pill a day and she does not think that he has been taking 2 pills a day because he has had some elevated BP readings. Readings from this AM were before meds. Made daughter aware that I would contact the pharmacy to find out what is going on.

## 2018-06-02 NOTE — Telephone Encounter (Signed)
Left message for Andrea to call back 

## 2018-06-03 ENCOUNTER — Inpatient Hospital Stay (HOSPITAL_COMMUNITY)
Admission: EM | Admit: 2018-06-03 | Discharge: 2018-06-08 | DRG: 292 | Disposition: A | Discharge status: Home Health | Payer: Medicare Other | Attending: Internal Medicine | Admitting: Internal Medicine

## 2018-06-03 ENCOUNTER — Telehealth: Payer: Self-pay | Admitting: Interventional Cardiology

## 2018-06-03 ENCOUNTER — Emergency Department (HOSPITAL_COMMUNITY): Payer: Medicare Other

## 2018-06-03 ENCOUNTER — Encounter (HOSPITAL_COMMUNITY): Payer: Self-pay | Admitting: Emergency Medicine

## 2018-06-03 ENCOUNTER — Other Ambulatory Visit: Payer: Self-pay

## 2018-06-03 DIAGNOSIS — I480 Paroxysmal atrial fibrillation: Secondary | ICD-10-CM | POA: Diagnosis present

## 2018-06-03 DIAGNOSIS — J9811 Atelectasis: Secondary | ICD-10-CM | POA: Diagnosis present

## 2018-06-03 DIAGNOSIS — R001 Bradycardia, unspecified: Secondary | ICD-10-CM | POA: Diagnosis not present

## 2018-06-03 DIAGNOSIS — D696 Thrombocytopenia, unspecified: Secondary | ICD-10-CM | POA: Diagnosis present

## 2018-06-03 DIAGNOSIS — I5023 Acute on chronic systolic (congestive) heart failure: Secondary | ICD-10-CM | POA: Diagnosis present

## 2018-06-03 DIAGNOSIS — R0602 Shortness of breath: Secondary | ICD-10-CM

## 2018-06-03 DIAGNOSIS — R600 Localized edema: Secondary | ICD-10-CM | POA: Diagnosis not present

## 2018-06-03 DIAGNOSIS — I071 Rheumatic tricuspid insufficiency: Secondary | ICD-10-CM | POA: Diagnosis present

## 2018-06-03 DIAGNOSIS — I2510 Atherosclerotic heart disease of native coronary artery without angina pectoris: Secondary | ICD-10-CM | POA: Diagnosis present

## 2018-06-03 DIAGNOSIS — Z7901 Long term (current) use of anticoagulants: Secondary | ICD-10-CM

## 2018-06-03 DIAGNOSIS — Z79899 Other long term (current) drug therapy: Secondary | ICD-10-CM

## 2018-06-03 DIAGNOSIS — I4892 Unspecified atrial flutter: Secondary | ICD-10-CM | POA: Diagnosis present

## 2018-06-03 DIAGNOSIS — I447 Left bundle-branch block, unspecified: Secondary | ICD-10-CM | POA: Diagnosis present

## 2018-06-03 DIAGNOSIS — I361 Nonrheumatic tricuspid (valve) insufficiency: Secondary | ICD-10-CM | POA: Diagnosis not present

## 2018-06-03 DIAGNOSIS — Z955 Presence of coronary angioplasty implant and graft: Secondary | ICD-10-CM | POA: Diagnosis not present

## 2018-06-03 DIAGNOSIS — I10 Essential (primary) hypertension: Secondary | ICD-10-CM | POA: Diagnosis present

## 2018-06-03 DIAGNOSIS — J90 Pleural effusion, not elsewhere classified: Secondary | ICD-10-CM

## 2018-06-03 DIAGNOSIS — I5043 Acute on chronic combined systolic (congestive) and diastolic (congestive) heart failure: Secondary | ICD-10-CM | POA: Diagnosis present

## 2018-06-03 DIAGNOSIS — E785 Hyperlipidemia, unspecified: Secondary | ICD-10-CM | POA: Diagnosis present

## 2018-06-03 DIAGNOSIS — I110 Hypertensive heart disease with heart failure: Secondary | ICD-10-CM | POA: Diagnosis not present

## 2018-06-03 DIAGNOSIS — E876 Hypokalemia: Secondary | ICD-10-CM | POA: Diagnosis present

## 2018-06-03 LAB — CBC
HCT: 40.1 % (ref 39.0–52.0)
HEMOGLOBIN: 12.9 g/dL — AB (ref 13.0–17.0)
MCH: 34 pg (ref 26.0–34.0)
MCHC: 32.2 g/dL (ref 30.0–36.0)
MCV: 105.8 fL — ABNORMAL HIGH (ref 78.0–100.0)
Platelets: 106 10*3/uL — ABNORMAL LOW (ref 150–400)
RBC: 3.79 MIL/uL — ABNORMAL LOW (ref 4.22–5.81)
RDW: 13.6 % (ref 11.5–15.5)
WBC: 4.7 10*3/uL (ref 4.0–10.5)

## 2018-06-03 LAB — BASIC METABOLIC PANEL
ANION GAP: 9 (ref 5–15)
BUN: 14 mg/dL (ref 8–23)
CALCIUM: 9.6 mg/dL (ref 8.9–10.3)
CO2: 25 mmol/L (ref 22–32)
Chloride: 103 mmol/L (ref 98–111)
Creatinine, Ser: 0.94 mg/dL (ref 0.61–1.24)
GFR calc Af Amer: >60 mL/min (ref >60)
GFR calc non Af Amer: >60 mL/min (ref >60)
Glucose, Bld: 131 mg/dL — ABNORMAL HIGH (ref 70–99)
Potassium: 3.9 mmol/L (ref 3.5–5.1)
Sodium: 137 mmol/L (ref 135–145)

## 2018-06-03 LAB — I-STAT TROPONIN, ED: TROPONIN I, POC: 0.03 ng/mL (ref 0.00–0.08)

## 2018-06-03 LAB — BRAIN NATRIURETIC PEPTIDE: B Natriuretic Peptide: 1225.8 pg/mL — ABNORMAL HIGH (ref 0.0–100.0)

## 2018-06-03 MED ORDER — POLYVINYL ALCOHOL 1.4 % OP SOLN
1.0000 [drp] | Freq: Two times a day (BID) | OPHTHALMIC | Status: DC | PRN
  Filled 2018-06-03: qty 15

## 2018-06-03 MED ORDER — SODIUM CHLORIDE 0.9 % IV SOLN: 250.0000 mL | INTRAVENOUS | Status: DC | PRN

## 2018-06-03 MED ORDER — ONDANSETRON HCL 4 MG/2ML IJ SOLN: 4.0000 mg | Freq: Four times a day (QID) | INTRAMUSCULAR | Status: DC | PRN

## 2018-06-03 MED ORDER — NITROFURANTOIN MONOHYD MACRO 100 MG PO CAPS
100.0000 mg | ORAL_CAPSULE | Freq: Two times a day (BID) | ORAL | Status: AC
  Administered 2018-06-04 – 2018-06-05 (×4): 100 mg via ORAL
  Filled 2018-06-03 (×4): qty 1

## 2018-06-03 MED ORDER — CARVEDILOL 3.125 MG PO TABS
3.1250 mg | ORAL_TABLET | Freq: Two times a day (BID) | ORAL | Status: DC
  Administered 2018-06-04: 3.125 mg via ORAL
  Filled 2018-06-03: qty 1

## 2018-06-03 MED ORDER — LISINOPRIL-HYDROCHLOROTHIAZIDE 20-12.5 MG PO TABS: 1.0000 | ORAL_TABLET | Freq: Every day | ORAL | Status: DC

## 2018-06-03 MED ORDER — OMEGA-3-ACID ETHYL ESTERS 1 G PO CAPS
1.0000 g | ORAL_CAPSULE | Freq: Two times a day (BID) | ORAL | Status: DC
  Administered 2018-06-04 – 2018-06-08 (×9): 1 g via ORAL
  Filled 2018-06-03 (×9): qty 1

## 2018-06-03 MED ORDER — POTASSIUM CHLORIDE CRYS ER 20 MEQ PO TBCR
20.0000 meq | EXTENDED_RELEASE_TABLET | Freq: Every day | ORAL | Status: DC
  Administered 2018-06-04: 20 meq via ORAL
  Filled 2018-06-03: qty 1

## 2018-06-03 MED ORDER — SODIUM CHLORIDE 0.9% FLUSH: 3.0000 mL | INTRAVENOUS | Status: DC | PRN

## 2018-06-03 MED ORDER — APIXABAN 5 MG PO TABS
5.0000 mg | ORAL_TABLET | Freq: Two times a day (BID) | ORAL | Status: DC
  Administered 2018-06-04 – 2018-06-07 (×8): 5 mg via ORAL
  Filled 2018-06-03 (×8): qty 1

## 2018-06-03 MED ORDER — SODIUM CHLORIDE 0.9% FLUSH
3.0000 mL | Freq: Two times a day (BID) | INTRAVENOUS | Status: DC
  Administered 2018-06-04 – 2018-06-08 (×9): 3 mL via INTRAVENOUS

## 2018-06-03 MED ORDER — FUROSEMIDE 10 MG/ML IJ SOLN
20.0000 mg | Freq: Two times a day (BID) | INTRAMUSCULAR | Status: DC
  Administered 2018-06-04 – 2018-06-07 (×7): 20 mg via INTRAVENOUS
  Filled 2018-06-03 (×7): qty 2

## 2018-06-03 MED ORDER — ACETAMINOPHEN 325 MG PO TABS
650.0000 mg | ORAL_TABLET | ORAL | Status: DC | PRN
  Administered 2018-06-06 – 2018-06-07 (×2): 650 mg via ORAL
  Filled 2018-06-03 (×2): qty 2

## 2018-06-03 MED ORDER — ATORVASTATIN CALCIUM 40 MG PO TABS
40.0000 mg | ORAL_TABLET | Freq: Every day | ORAL | Status: DC
  Administered 2018-06-04 – 2018-06-07 (×4): 40 mg via ORAL
  Filled 2018-06-03 (×4): qty 1

## 2018-06-03 MED ORDER — FUROSEMIDE 10 MG/ML IJ SOLN
20.0000 mg | Freq: Once | INTRAMUSCULAR | Status: AC
  Administered 2018-06-03: 20 mg via INTRAVENOUS
  Filled 2018-06-03: qty 2

## 2018-06-03 NOTE — H&P (Signed)
History and Physical    Harrington Sink. BJS:[id number] DOB: April 20, 1932 DOA: 06/03/2018  PCP: Lawerance Cruel, MD   Patient coming from: Home   Chief Complaint: SOB, leg swelling, cough   HPI: Parker Groman. is a 84 y.o. male with medical history significant for paroxysmal atrial fibrillation on Eliquis, coronary artery disease, hypertension, chronic left bundle branch block, and chronic thrombocytopenia, now presenting to the emergency department for evaluation of shortness of breath and bilateral leg swelling.  Patient is accompanied by his daughter who assist with the history.  He has been noted to have worsening bilateral lower extremity edema for at least 2 weeks, but had not been particularly short of breath until several days ago.  For the past several days, he has had shortness of breath, initially with exertion, but now at rest also.  There has been a mild cough, occasionally productive of thick yellow sputum.  No chest pain.  He also denies fevers or chills.  He had an echocardiogram in March with EF 35 to 40%.    ED Course: Upon arrival to the ED, patient is found to be afebrile, saturating adequately on room air while at rest, tachypneic, and hypertensive.  EKG features atrial flutter with chronic left bundle branch block.  Chest x-ray is notable for cardiomegaly, small bilateral pleural effusions, and bibasilar airspace disease likely reflecting atelectasis or possibly pneumonia.  Chemistry panel is unremarkable and CBC is notable for a worsened thrombocytopenia with platelets 106,000.  Troponin is normal and BNP is elevated at 1226.  Patient was treated with 20 mg IV Lasix in the ED.  He has begun to diurese, but remains short of breath at rest and will be admitted for ongoing evaluation and management.  Review of Systems:  All other systems reviewed and apart from HPI, are negative.  Past Medical History:  Diagnosis Date  . ASCVD (arteriosclerotic cardiovascular disease)    single vessel  . Coronary atherosclerosis of native coronary artery   . Dyslipidemia   . ED (erectile dysfunction)   . Hypertension   . Inguinal hernia   . Left bundle branch block    chronic  . Postsurgical percutaneous transluminal coronary angioplasty status     Past Surgical History:  Procedure Laterality Date  . CARDIAC CATHETERIZATION  1998  . CARDIOVASCULAR STRESS TEST  05/25/12  . CHOLECYSTECTOMY  1979  . [place]  . INGUINAL HERNIA REPAIR  06/15/2012   Procedure: HERNIA REPAIR INGUINAL ADULT;  Surgeon: Adin Hector, MD;  Location: [place];  Service: General;  Laterality: Bilateral;  Bilateral Inguinal Hernia Repair with Ultrapro Mesh     reports that he has never smoked. He has never used smokeless tobacco. He reports that he does not drink alcohol or use drugs.  No Known Allergies  Family History  Problem Relation Age of Onset  . Arthritis Father   . Hypertension Paternal Uncle   . Heart attack Neg Hx   . Stroke Neg Hx      Prior to Admission medications   Medication Sig Start Date End Date Taking? Authorizing Provider  acetaminophen (TYLENOL) 500 MG tablet Take 500 mg by mouth every 6 (six) hours as needed for mild pain, moderate pain or headache.   Yes [provider]  apixaban (ELIQUIS) 5 MG TABS tablet Take 1 tablet (5 mg total) by mouth 2 (two) times daily. 12/07/17  Yes Jettie Booze, MD  atorvastatin (LIPITOR) 40 MG tablet TAKE  1 TABLET (40 MG TOTAL) BY MOUTH DAILY. 12/24/17  Yes Jettie Booze, MD  carvedilol (COREG) 3.125 MG tablet Take 1 tablet (3.125 mg total) by mouth 2 (two) times daily. 01/13/18 06/03/18 Yes Jettie Booze, MD  hydrochlorothiazide (HYDRODIURIL) 12.5 MG tablet Take 12.5 mg by mouth daily.   Yes [provider]  lisinopril-hydrochlorothiazide (PRINZIDE,ZESTORETIC) 20-12.5 MG tablet TAKE 1 TABLET BY MOUTH DAILY. 12/20/17  Yes Nahser, Wonda Cheng, MD  Multiple Vitamins-Minerals ([employer]  SPECTRAVITE PO) Take by mouth every other day.   Yes [provider]  Multiple Vitamins-Minerals (OCUVITE-LUTEIN PO) Take 1 capsule by mouth daily.   Yes [provider]  nitrofurantoin, macrocrystal-monohydrate, (MACROBID) 100 MG capsule Take 100 mg by mouth 2 (two) times daily.   Yes [provider]  Omega-3 Fatty Acids (FISH OIL) 1200 MG CAPS Take 2 capsules by mouth 2 (two) times daily at 8 am and 10 pm.   Yes [provider]  Polyethyl Glycol-Propyl Glycol (SYSTANE) 0.4-0.3 % SOLN Apply 1 drop to eye 2 (two) times daily as needed (dry eyes).    Yes [provider]  potassium chloride SA (KLOR-CON M20) 20 MEQ tablet Take 1 tablet (20 mEq total) by mouth daily. 02/03/18  Yes Jettie Booze, MD    Physical Exam: Vitals:   06/03/18 2144 06/03/18 2145 06/03/18 2229 06/03/18 2300  BP: (!) 166/104 (!) 180/99  136/70  Pulse: 69 64 66 (!) 57  Resp: 16 (!) 21 (!) 21 16  Temp:      TempSrc:      SpO2: 100% 100% 99% 99%      Constitutional: Mild tachypnea at rest, dyspneic with speech, no pallor, no diaphoresis  Eyes: PERTLA, lids and conjunctivae normal ENMT: Mucous membranes are moist. Posterior pharynx clear of any exudate or lesions.   Neck: normal, supple, no masses, no thyromegaly Respiratory: Tachypnea, dyspnea with speech. Fine rales bilaterally. No accessory muscle use.  Cardiovascular: Rate ~80 and irregular. 2+ pretibial pitting edema bilaterally. Neck veins distended throughout their visible course.  Abdomen: No distension, no tenderness, no masses palpated. Bowel sounds normal.  Musculoskeletal: no clubbing / cyanosis. No joint deformity upper and lower extremities.   Skin: no significant rashes, lesions, ulcers. Warm, dry, well-perfused. Neurologic: No facial asymmetry. Sensation intact. Moving all extremities.  Psychiatric: Alert and oriented to person, place, and situation. Calm, cooperative.     Labs on Admission: I have  personally reviewed following labs and imaging studies  CBC: Recent Labs  Lab 06/03/18 2112  WBC 4.7  HGB 12.9*  HCT 40.1  MCV 105.8*  PLT 010*   Basic Metabolic Panel: Recent Labs  Lab 06/03/18 2112  NA 137  K 3.9  CL 103  CO2 25  GLUCOSE 131*  BUN 14  CREATININE 0.94  CALCIUM 9.6   GFR: CrCl cannot be calculated (Unknown ideal weight.). Liver Function Tests: No results for input(s): AST, ALT, ALKPHOS, BILITOT, PROT, ALBUMIN in the last 168 hours. No results for input(s): LIPASE, AMYLASE in the last 168 hours. No results for input(s): AMMONIA in the last 168 hours. Coagulation Profile: No results for input(s): INR, PROTIME in the last 168 hours. Cardiac Enzymes: No results for input(s): CKTOTAL, CKMB, CKMBINDEX, TROPONINI in the last 168 hours. BNP (last 3 results) No results for input(s): PROBNP in the last 8760 hours. HbA1C: No results for input(s): HGBA1C in the last 72 hours. CBG: No results for input(s): GLUCAP in the last 168 hours. Lipid Profile: No results  for input(s): CHOL, HDL, LDLCALC, TRIG, CHOLHDL, LDLDIRECT in the last 72 hours. Thyroid Function Tests: No results for input(s): TSH, T4TOTAL, FREET4, T3FREE, THYROIDAB in the last 72 hours. Anemia Panel: No results for input(s): VITAMINB12, FOLATE, FERRITIN, TIBC, IRON, RETICCTPCT in the last 72 hours. Urine analysis:    Component Value Date/Time   COLORURINE YELLOW 06/13/2012 1027   APPEARANCEUR CLEAR 06/13/2012 1027   LABSPEC 1.008 06/13/2012 1027   PHURINE 7.0 06/13/2012 1027   GLUCOSEU NEGATIVE 06/13/2012 1027   HGBUR NEGATIVE 06/13/2012 1027   BILIRUBINUR NEGATIVE 06/13/2012 1027   KETONESUR NEGATIVE 06/13/2012 1027   PROTEINUR NEGATIVE 06/13/2012 1027   UROBILINOGEN 0.2 06/13/2012 1027   NITRITE NEGATIVE 06/13/2012 1027   LEUKOCYTESUR NEGATIVE 06/13/2012 1027   Sepsis Labs: @LABRCNTIP (procalcitonin:4,lacticidven:4) )No results found for this or any previous visit (from the past 240  hour(s)).   Radiological Exams on Admission: Dg Chest 2 View  Result Date: 06/03/2018 CLINICAL DATA:  Short of breath EXAM: CHEST - 2 VIEW COMPARISON:  06/03/2018, 06/13/2012 FINDINGS: Left greater than right pleural effusions, slightly increased on the right. Stable cardiomegaly. Bibasilar airspace disease. No pneumothorax. IMPRESSION: Small left greater than right bilateral pleural effusions, slightly increased on the right side compared with prior radiograph. Cardiomegaly. Bibasilar airspace disease may reflect atelectasis or pneumonia. Electronically Signed   By: Donavan Foil M.D.   On: 06/03/2018 21:23    EKG: Independently reviewed. Atrial flutter, chronic LBBB.   Assessment/Plan  1. Acute on chronic systolic CHF  - Presents with insidiously worsening DOE and bilateral leg edema, noted to have rales and elevated JVP on exam, marked elevation in BNP, small effusions on CXR, afebrile and without leukocytosis  - Echo from March of this year with EF 35-40%, mild LVH, mild concentric hypertrophy, mod diffuse HK, mild MR, mod-severe TR, and mod-severe RAE and LAE  - He was treated with Lasix 20 mg IV in ED and has begun to diurese  - SLIV, continue diuresis with Lasix 20 mg IV q12h, continue beta-blocker and ACE, follow daily wt and I/O's    2. CAD  - No anginal complaints, troponin negative, LBBB is chronic  - Continue statin, beta-blocker, and ACE    3. Paroxysmal atrial fibrillation  - In atrial flutter on admission  - CHADS-VASc 5 (age x2, CAD, CHF, HTN)  - Continue Eliquis and beta-blocker    4. Hypertension  - BP elevated in ED, reports has been elevated at home lately  - Anticipate improvement with diuresis  - Continue Coreg, lisinopril, and HCTZ    5. Thrombocytopenia  - Platelets 106,000 on admission, worse than priors  - No bleeding  - Repeat CBC in am to confirm, check for nutrient deficiency     DVT prophylaxis: Eliquis  Code Status: Full  Family Communication:  Daughter updated at bedside Consults called: None Admission status: Inpatient     Vianne Bulls, MD [facility] Pager [phone]  If 7PM-7AM, please contact night-coverage [email or web address] Password [facility]  06/03/2018, 11:39 PM

## 2018-06-03 NOTE — Telephone Encounter (Signed)
New problem    Pt c/o BP issue: STAT if pt c/o blurred vision, one-sided weakness or slurred speech  1. What are your last 5 BP readings?  194/95  2. Are you having any other symptoms (ex. Dizziness, headache, blurred vision, passed out)? Daughter stated pt has had SOB today but don't know if he is having it now. She isn't with pt.  3. What is your BP issue? High. Daughter don't know if he is having any other problems because she isn't with pt.

## 2018-06-03 NOTE — ED Triage Notes (Signed)
Went to [facility] for high BP and productive cough with yellow sputum since yesterday.  Pt complained to his occupational therapist this morning that he felt SOB.  Family reports PCP office sent him to ED for abnormal EKG and fluid on lungs.

## 2018-06-03 NOTE — ED Provider Notes (Signed)
[facility] EMERGENCY DEPARTMENT Provider Note   CSN: [id number] Arrival date & time: 06/03/18  1948     History   Chief Complaint Chief Complaint  Patient presents with  . Shortness of Breath  . Cough    HPI Parker Odonnell. is a 84 y.o. male.  The history is provided by the patient and medical records.  Shortness of Breath  This is a new problem. The average episode lasts 3 weeks. The problem occurs continuously.The current episode started more than 1 week ago. The problem has been gradually worsening. Associated symptoms include wheezing and leg swelling. Pertinent negatives include no fever, no headaches, no neck pain, no cough, no sputum production, no chest pain, no vomiting, no abdominal pain, no rash, no leg pain and no claudication. Associated medical issues include CAD. Associated medical issues do not include asthma, COPD, PE, heart failure or DVT.    Past Medical History:  Diagnosis Date  . ASCVD (arteriosclerotic cardiovascular disease)    single vessel  . Coronary atherosclerosis of native coronary artery   . Dyslipidemia   . ED (erectile dysfunction)   . Hypertension   . Inguinal hernia   . Left bundle branch block    chronic  . Postsurgical percutaneous transluminal coronary angioplasty status     Patient Active Problem List   Diagnosis Date Noted  . Essential hypertension 09/19/2015  . LBBB (left bundle branch block) 09/19/2015  . Hypokalemia 08/27/2014  . Hypertension   . ASCVD (arteriosclerotic cardiovascular disease)   . Dyslipidemia   . ED (erectile dysfunction)   . Left bundle branch block   . Postsurgical percutaneous transluminal coronary angioplasty status   . Coronary atherosclerosis of native coronary artery   . Bilateral inguinal hernia 06/15/2012    Past Surgical History:  Procedure Laterality Date  . CARDIAC CATHETERIZATION  1998  . CARDIOVASCULAR STRESS TEST  05/25/12  . CHOLECYSTECTOMY  1979  . [place]  . INGUINAL HERNIA REPAIR  06/15/2012   Procedure: HERNIA REPAIR INGUINAL ADULT;  Surgeon: Adin Hector, MD;  Location: [facility];  Service: General;  Laterality: Bilateral;  Bilateral Inguinal Hernia Repair with Ultrapro Mesh        Home Medications    Prior to Admission medications   Medication Sig Start Date End Date Taking? Authorizing Provider  acetaminophen (TYLENOL) 500 MG tablet Take 500 mg by mouth every 6 (six) hours as needed for mild pain, moderate pain or headache.    [provider]  apixaban (ELIQUIS) 5 MG TABS tablet Take 1 tablet (5 mg total) by mouth 2 (two) times daily. 12/07/17   Jettie Booze, MD  atorvastatin (LIPITOR) 40 MG tablet TAKE 1 TABLET (40 MG TOTAL) BY MOUTH DAILY. 12/24/17   Jettie Booze, MD  carvedilol (COREG) 3.125 MG tablet Take 1 tablet (3.125 mg total) by mouth 2 (two) times daily. 01/13/18 04/13/18  Jettie Booze, MD  hydrochlorothiazide (HYDRODIURIL) 12.5 MG tablet Take 12.5 mg by mouth daily.    [provider]  lisinopril-hydrochlorothiazide (PRINZIDE,ZESTORETIC) 20-12.5 MG tablet TAKE 1 TABLET BY MOUTH DAILY. 12/20/17   Nahser, Wonda Cheng, MD  Lutein 20 MG CAPS Take 20 mg by mouth every other day.    [provider]  Lutein 6 MG CAPS Take 1 capsule by mouth daily.    [provider]  Multiple Vitamins-Minerals ([employer] SPECTRAVITE PO) Take by mouth every other day.    [provider]  Omega-3 Fatty Acids (FISH OIL) 1200 MG CAPS Take 2 capsules by mouth 2 (two) times daily at 8 am and 10 pm.    [provider]  Polyethyl Glycol-Propyl Glycol (SYSTANE) 0.4-0.3 % SOLN Apply 1 drop to eye 2 (two) times daily.     [provider]  potassium chloride SA (KLOR-CON M20) 20 MEQ tablet Take 1 tablet (20 mEq total) by mouth daily. 02/03/18   Jettie Booze, MD    Family History Family History  Problem Relation Age of Onset  . Arthritis Father   . Hypertension  Paternal Uncle   . Heart attack Neg Hx   . Stroke Neg Hx     Social History Social History   Tobacco Use  . Smoking status: Never Smoker  . Smokeless tobacco: Never Used  Substance Use Topics  . Alcohol use: No  . Drug use: No     Allergies   Patient has no known allergies.   Review of Systems Review of Systems  Constitutional: Positive for fatigue. Negative for chills, diaphoresis and fever.  HENT: Negative for congestion.   Eyes: Negative for visual disturbance.  Respiratory: Positive for chest tightness, shortness of breath and wheezing. Negative for cough and sputum production.   Cardiovascular: Positive for leg swelling. Negative for chest pain, palpitations and claudication.  Gastrointestinal: Negative for abdominal pain, constipation, diarrhea, nausea and vomiting.  Genitourinary: Negative for flank pain.  Musculoskeletal: Negative for back pain, neck pain and neck stiffness.  Skin: Negative for rash and wound.  Neurological: Negative for light-headedness and headaches.     Physical Exam Updated Vital Signs BP (!) 154/83 (BP Location: Right Arm)   Pulse 67   Temp 97.7 F (36.5 C) (Oral)   Resp 14   SpO2 100%   Physical Exam  Constitutional: He is oriented to person, place, and time. He appears well-developed and well-nourished. No distress.  HENT:  Head: Normocephalic and atraumatic.  Mouth/Throat: Oropharynx is clear and moist. No oropharyngeal exudate.  Eyes: Pupils are equal, round, and reactive to light. Conjunctivae and EOM are normal.  Neck: Neck supple.  Cardiovascular: Normal rate, regular rhythm and intact distal pulses.  No murmur heard. Pulmonary/Chest: Effort normal. No respiratory distress. He has rales. He exhibits no tenderness.  Abdominal: Soft. He exhibits no distension. There is no tenderness. There is no guarding.  Musculoskeletal: He exhibits edema. He exhibits no tenderness.  Neurological: He is alert and oriented to person, place,  and time. No cranial nerve deficit or sensory deficit. He exhibits normal muscle tone. Coordination normal.  Skin: Skin is warm. Capillary refill takes less than 2 seconds. He is not diaphoretic. No erythema. No pallor.  Psychiatric: He has a normal mood and affect.  Nursing note and vitals reviewed.    ED Treatments / Results  Labs (all labs ordered are listed, but only abnormal results are displayed) Labs Reviewed  BASIC METABOLIC PANEL - Abnormal; Notable for the following components:      Result Value   Glucose, Bld 131 (*)    All other components within normal limits  CBC - Abnormal; Notable for the following components:   RBC 3.79 (*)    Hemoglobin 12.9 (*)    MCV 105.8 (*)    Platelets 106 (*)    All other components within normal limits  BRAIN NATRIURETIC PEPTIDE - Abnormal; Notable for the following components:   B Natriuretic Peptide 1,225.8 (*)    All other components within normal limits  BASIC  METABOLIC PANEL  VITAMIN M76  FOLATE  CBC  PROCALCITONIN  I-STAT TROPONIN, ED    EKG EKG Interpretation  Date/Time:  Friday June 03 2018 20:53:49 EDT Ventricular Rate:  62 PR Interval:    QRS Duration: 154 QT Interval:  450 QTC Calculation: 456 R Axis:   -42 Text Interpretation:  Atrial flutter with variable A-V block Left axis deviation Left bundle branch block Abnormal ECG when compared to prior, now a flutter.  No STEMI Confirmed by Antony Blackbird [phone]) on 06/03/2018 9:13:00 PM   Radiology Dg Chest 2 View  Result Date: 06/03/2018 CLINICAL DATA:  Short of breath EXAM: CHEST - 2 VIEW COMPARISON:  06/03/2018, 06/13/2012 FINDINGS: Left greater than right pleural effusions, slightly increased on the right. Stable cardiomegaly. Bibasilar airspace disease. No pneumothorax. IMPRESSION: Small left greater than right bilateral pleural effusions, slightly increased on the right side compared with prior radiograph. Cardiomegaly. Bibasilar airspace disease may reflect  atelectasis or pneumonia. Electronically Signed   By: Donavan Foil M.D.   On: 06/03/2018 21:23    Procedures Procedures (including critical care time)  Medications Ordered in ED Medications  furosemide (LASIX) injection 20 mg (20 mg Intravenous Given 06/03/18 2311)     Initial Impression / Assessment and Plan / ED Course  I have reviewed the triage vital signs and the nursing notes.  Pertinent labs & imaging results that were available during my care of the patient were reviewed by me and considered in my medical decision making (see chart for details).     Kaien Pezzullo. is a 84 y.o. male with a past medical history significant for A. fib/flutter on Eliquis, CAD status post PCI, known left bundle branch block, and hyperlipidemia who presents from PCP office for exertional shortness of breath and peripheral edema.  Patient reports that for the last few weeks he has had worsening peripheral edema and shortness of breath when he tries to ambulate.  He denies chest pain or chest pressure.  He says that he has never been diagnosed with heart failure had to take a diuretic.  He says that his PCP today they did a chest x-ray showing concern for bilateral pleural effusions.  PCP was concerned about new heart failure.  Patient was sent to the ED for evaluation.  Next  On arrival, patient denies any cough, congestion, or fever/chills.  He denies nausea, vomiting, diaphoresis.  He reports that he tries to walk he will get very winded and short of breath which is new for him and progressive.  He denies any history of DVT or PE and is on the Eliquis for the A. fib.  On exam, patient is crackles in the bases of both lungs.  Very faint wheezes are also appreciated.  Chest is nontender and no murmur was heard.  Abdomen nontender and legs were edematous bilaterally.  Patient had palpable DP pulses.  No abnormality of strength or sensation throughout.  Clinically we are concerned about new heart failure  and fluid overload.  Patient's troponin was negative however EKG showed persistent A. fib with the bundle branch block.  Chest x-ray confirmed the bilateral pleural effusions and BNP finally returned elevated at over 1200.  Kidney function was normal, patient will be given Lasix.  Due to the patient's new diagnosis of likely heart failure and fluid overload in the setting of his prior CAD, patient will be admitted for further diuresis and evaluation.   Final Clinical Impressions(s) / ED Diagnoses   Final diagnoses:  Shortness of breath  Bilateral pleural effusion  Exertional shortness of breath  Bilateral edema of lower extremity    ED Discharge Orders    None     Clinical Impression: 1. Shortness of breath   2. Bilateral pleural effusion   3. Exertional shortness of breath   4. Bilateral edema of lower extremity     Disposition: Admit  This note was prepared with assistance of Dragon voice recognition software. Occasional wrong-word or sound-a-like substitutions may have occurred due to the inherent limitations of voice recognition software.     Janyra Barillas, Gwenyth Allegra, MD 06/04/18 [phone]

## 2018-06-03 NOTE — Telephone Encounter (Signed)
Spoke with pt today who is concerned for her father's increased BP and SOB today. She states his BP has been considerably high today and is uncomfortable. She was questioning whether he should visit the ED or go to [facility]. I advised pt's daughter that it was totally up to her, but if she wanted to avoid the wait in the ED I felt it would be appropriate for him to be seen at [facility] to be evaluated. [facility] would be able to assess fluid status, BP measurements and weights accordingly.   Pt's daughter believes he needs to be seen by a cardiologist sooner than later with his recent BP changes and SOB. I told her I would forward this message to Dr Hassell Done RN to see where he might be worked in next week.   She verbalized understanding and thanked me for the call and advice.

## 2018-06-03 NOTE — Telephone Encounter (Signed)
Pt scheduled for 8/23 @ 1:40pm for an OV. Slot ok to take per [place], Therapist, sports.

## 2018-06-03 NOTE — ED Provider Notes (Signed)
Patient placed in Quick Look pathway, seen and evaluated   Chief Complaint: Pleural effusions  HPI: Patient with with history of hypertension on HCTZ, remote stent placement, atrial fibrillation on Eliquis presents from PCP office today.  Patient was noted to have bilateral pleural effusions on chest x-ray.  Patient has had worsening lower extremity swelling over the past week or so.  He has had a cough productive of yellow sputum starting yesterday.  Questionable mild shortness of breath this morning.  Patient denies any recent dyspnea with exertion and states that he has not had any difficulty lying flat.  ROS:  Positive ROS: (+) Lower extremity swelling, cough Negative ROS: (-) Fever, chest pain  Physical Exam:   Gen: No distress  Neuro: Awake and Alert  Skin: Warm    Focused Exam: Heart irregular, nml S1,S2, no m/r/g; Lungs CTAB, decreased at bases; Abd soft, NT, no rebound or guarding; Ext 2+ pedal pulses bilaterally, 2+ symmetric pedal edema bilaterally.  BP (!) 154/83 (BP Location: Right Arm)   Pulse 67   Temp 97.7 F (36.5 C) (Oral)   Resp 14   SpO2 100%   Plan: Reviewed x-ray performed at [facility] walk-in clinic today.  Shows bilateral pleural effusions.  Suspect patient will need admission for new onset heart failure lab work pending.  EKG reviewed.  Initiation of care has begun. The patient has been counseled on the process, plan, and necessity for staying for the completion/evaluation, and the remainder of the medical screening examination    Suann Larry 06/03/18 2105    Carmin Muskrat, MD 06/04/18 [phone]

## 2018-06-04 ENCOUNTER — Inpatient Hospital Stay (HOSPITAL_COMMUNITY): Payer: Medicare Other

## 2018-06-04 ENCOUNTER — Other Ambulatory Visit: Payer: Self-pay

## 2018-06-04 DIAGNOSIS — I10 Essential (primary) hypertension: Secondary | ICD-10-CM

## 2018-06-04 DIAGNOSIS — I2510 Atherosclerotic heart disease of native coronary artery without angina pectoris: Secondary | ICD-10-CM

## 2018-06-04 DIAGNOSIS — I5023 Acute on chronic systolic (congestive) heart failure: Secondary | ICD-10-CM

## 2018-06-04 DIAGNOSIS — I361 Nonrheumatic tricuspid (valve) insufficiency: Secondary | ICD-10-CM

## 2018-06-04 LAB — BASIC METABOLIC PANEL
ANION GAP: 9 (ref 5–15)
BUN: 9 mg/dL (ref 8–23)
CALCIUM: 9.1 mg/dL (ref 8.9–10.3)
CO2: 26 mmol/L (ref 22–32)
Chloride: 103 mmol/L (ref 98–111)
Creatinine, Ser: 0.88 mg/dL (ref 0.61–1.24)
GFR calc Af Amer: >60 mL/min (ref >60)
GLUCOSE: 105 mg/dL — AB (ref 70–99)
POTASSIUM: 3.1 mmol/L — AB (ref 3.5–5.1)
Sodium: 138 mmol/L (ref 135–145)

## 2018-06-04 LAB — FOLATE: FOLATE: 16.6 ng/mL (ref >5.9)

## 2018-06-04 LAB — TROPONIN I

## 2018-06-04 LAB — CBC
HCT: 33.9 % — ABNORMAL LOW (ref 39.0–52.0)
Hemoglobin: 11.1 g/dL — ABNORMAL LOW (ref 13.0–17.0)
MCH: 33.6 pg (ref 26.0–34.0)
MCHC: 32.7 g/dL (ref 30.0–36.0)
MCV: 102.7 fL — ABNORMAL HIGH (ref 78.0–100.0)
PLATELETS: 97 10*3/uL — AB (ref 150–400)
RBC: 3.3 MIL/uL — ABNORMAL LOW (ref 4.22–5.81)
RDW: 13.3 % (ref 11.5–15.5)
WBC: 3.3 10*3/uL — AB (ref 4.0–10.5)

## 2018-06-04 LAB — MAGNESIUM: Magnesium: 1.8 mg/dL (ref 1.7–2.4)

## 2018-06-04 LAB — ECHOCARDIOGRAM COMPLETE
Height: 69 in
Weight: 2430.35 oz

## 2018-06-04 LAB — VITAMIN B12: Vitamin B-12: 210 pg/mL (ref 180–914)

## 2018-06-04 MED ORDER — POTASSIUM CHLORIDE CRYS ER 20 MEQ PO TBCR
20.0000 meq | EXTENDED_RELEASE_TABLET | Freq: Two times a day (BID) | ORAL | Status: DC
  Administered 2018-06-04 – 2018-06-05 (×2): 20 meq via ORAL
  Filled 2018-06-04 (×2): qty 1

## 2018-06-04 MED ORDER — POTASSIUM CHLORIDE CRYS ER 20 MEQ PO TBCR
40.0000 meq | EXTENDED_RELEASE_TABLET | Freq: Once | ORAL | Status: DC
  Filled 2018-06-04: qty 2

## 2018-06-04 MED ORDER — POTASSIUM CHLORIDE CRYS ER 20 MEQ PO TBCR: 40.0000 meq | EXTENDED_RELEASE_TABLET | Freq: Two times a day (BID) | ORAL | Status: DC

## 2018-06-04 MED ORDER — POTASSIUM CHLORIDE CRYS ER 20 MEQ PO TBCR
40.0000 meq | EXTENDED_RELEASE_TABLET | Freq: Once | ORAL | Status: AC
  Administered 2018-06-04: 40 meq via ORAL

## 2018-06-04 MED ORDER — HYDROCHLOROTHIAZIDE 12.5 MG PO CAPS
12.5000 mg | ORAL_CAPSULE | Freq: Every day | ORAL | Status: DC
  Administered 2018-06-04: 12.5 mg via ORAL
  Filled 2018-06-04: qty 1

## 2018-06-04 MED ORDER — LISINOPRIL 20 MG PO TABS
20.0000 mg | ORAL_TABLET | Freq: Every day | ORAL | Status: DC
  Administered 2018-06-04 – 2018-06-08 (×5): 20 mg via ORAL
  Filled 2018-06-04 (×5): qty 1

## 2018-06-04 MED ORDER — MAGNESIUM SULFATE 2 GM/50ML IV SOLN
2.0000 g | Freq: Once | INTRAVENOUS | Status: AC
  Administered 2018-06-04: 2 g via INTRAVENOUS
  Filled 2018-06-04: qty 50

## 2018-06-04 MED ORDER — POTASSIUM CHLORIDE CRYS ER 20 MEQ PO TBCR: 40.0000 meq | EXTENDED_RELEASE_TABLET | Freq: Once | ORAL | Status: DC

## 2018-06-04 MED ORDER — SPIRONOLACTONE 12.5 MG HALF TABLET
12.5000 mg | ORAL_TABLET | Freq: Every day | ORAL | Status: DC
  Administered 2018-06-04 – 2018-06-08 (×5): 12.5 mg via ORAL
  Filled 2018-06-04 (×5): qty 1

## 2018-06-04 NOTE — Progress Notes (Signed)
HR is dropping to 40's. MD notified, BB stopped.Pt asymptomatic.   Liliyana Thobe, RN

## 2018-06-04 NOTE — Progress Notes (Signed)
Per CCMD HR went down to 39 and than immediately went back up to 52. Patient is resting, asymptomatic.  Rasaan Brotherton, RN

## 2018-06-04 NOTE — Progress Notes (Signed)
  Echocardiogram 2D Echocardiogram has been performed.  Parker Odonnell 06/04/2018, 2:12 PM

## 2018-06-04 NOTE — Consult Note (Addendum)
Cardiology Consultation:   Patient ID: Parker Odonnell.; [id number]; 1932/01/20   Admit date: 06/03/2018 Date of Consult: 06/04/2018  Primary Care Provider: Lawerance Cruel, MD Primary Cardiologist: Larae Grooms, MD   Patient Profile:   Parker Odonnell. is a 84 y.o. male with a hx of CAD s/p LAD stent in 0355, chronic systolic and diastolic heart failure, atrial fibrillation/atrial flutter who is being seen today for the evaluation of heart failure at the request of Dr. Tyrell Antonio.  History of Present Illness:   Parker Odonnell reports feeling well and noticing very little other than mild swelling over the past few weeks. His family members endorse a slightly different timeline. They note that his legs have appeared more swollen recently, and his blood pressure readings have been elevated as well. His blood pressures continued to be very high, and family took him to [facility] urgent care yesterday for further evaluation. He was noted to have bilateral pleural effusions, lower extremity edema, and cough with yellow sputum. He was sent to [facility] ER for further evaluation.  Since admission yesterday at [facility], he reports feeling well. He denies any chest pain, PND, or orthopnea and reports that sleep has been good. However, his son noted that if he lies flat for a long time and tries to speak, he does become shortwinded. No syncope. No fevers or chills.  Past Medical History:  Diagnosis Date  . ASCVD (arteriosclerotic cardiovascular disease)    single vessel  . Coronary atherosclerosis of native coronary artery   . Dyslipidemia   . ED (erectile dysfunction)   . Hypertension   . Inguinal hernia   . Left bundle branch block    chronic  . Postsurgical percutaneous transluminal coronary angioplasty status     Past Surgical History:  Procedure Laterality Date  . CARDIAC CATHETERIZATION  1998  . CARDIOVASCULAR STRESS TEST  05/25/12  . CHOLECYSTECTOMY  1979  . [place]  .  INGUINAL HERNIA REPAIR  06/15/2012   Procedure: HERNIA REPAIR INGUINAL ADULT;  Surgeon: Adin Hector, MD;  Location: [facility];  Service: General;  Laterality: Bilateral;  Bilateral Inguinal Hernia Repair with Ultrapro Mesh     Home Medications:  Prior to Admission medications   Medication Sig Start Date End Date Taking? Authorizing Provider  acetaminophen (TYLENOL) 500 MG tablet Take 500 mg by mouth every 6 (six) hours as needed for mild pain, moderate pain or headache.   Yes [provider]  apixaban (ELIQUIS) 5 MG TABS tablet Take 1 tablet (5 mg total) by mouth 2 (two) times daily. 12/07/17  Yes Jettie Booze, MD  atorvastatin (LIPITOR) 40 MG tablet TAKE 1 TABLET (40 MG TOTAL) BY MOUTH DAILY. 12/24/17  Yes Jettie Booze, MD  carvedilol (COREG) 3.125 MG tablet Take 1 tablet (3.125 mg total) by mouth 2 (two) times daily. 01/13/18 06/03/18 Yes Jettie Booze, MD  hydrochlorothiazide (HYDRODIURIL) 12.5 MG tablet Take 12.5 mg by mouth daily.   Yes [provider]  lisinopril-hydrochlorothiazide (PRINZIDE,ZESTORETIC) 20-12.5 MG tablet TAKE 1 TABLET BY MOUTH DAILY. 12/20/17  Yes Nahser, Wonda Cheng, MD  Multiple Vitamins-Minerals ([employer] SPECTRAVITE PO) Take by mouth every other day.   Yes [provider]  Multiple Vitamins-Minerals (OCUVITE-LUTEIN PO) Take 1 capsule by mouth daily.   Yes [provider]  nitrofurantoin, macrocrystal-monohydrate, (MACROBID) 100 MG capsule Take 100 mg by mouth 2 (two) times daily.   Yes [provider]  Omega-3 Fatty Acids (FISH OIL) 1200  MG CAPS Take 2 capsules by mouth 2 (two) times daily at 8 am and 10 pm.   Yes [provider]  Polyethyl Glycol-Propyl Glycol (SYSTANE) 0.4-0.3 % SOLN Apply 1 drop to eye 2 (two) times daily as needed (dry eyes).    Yes [provider]  potassium chloride SA (KLOR-CON M20) 20 MEQ tablet Take 1 tablet (20 mEq total) by mouth daily. 02/03/18  Yes Jettie Booze, MD     Inpatient Medications: Scheduled Meds: . apixaban  5 mg Oral BID  . atorvastatin  40 mg Oral q1800  . furosemide  20 mg Intravenous Q12H  . lisinopril  20 mg Oral Daily  . nitrofurantoin (macrocrystal-monohydrate)  100 mg Oral BID  . omega-3 acid ethyl esters  1 g Oral BID  . potassium chloride SA  20 mEq Oral BID  . sodium chloride flush  3 mL Intravenous Q12H   Continuous Infusions: . sodium chloride    . magnesium sulfate 1 - 4 g bolus IVPB 2 g (06/04/18 1553)   PRN Meds: sodium chloride, acetaminophen, ondansetron (ZOFRAN) IV, polyvinyl alcohol, sodium chloride flush  Allergies:   No Known Allergies  Social History:   Social History   Socioeconomic History  . Marital status: Widowed    Spouse name: Not on file  . Number of children: Not on file  . Years of education: Not on file  . Highest education level: Not on file  Occupational History  . Not on file  Social Needs  . Financial resource strain: Not on file  . Food insecurity:    Worry: Not on file    Inability: Not on file  . Transportation needs:    Medical: Not on file    Non-medical: Not on file  Tobacco Use  . Smoking status: Never Smoker  . Smokeless tobacco: Never Used  Substance and Sexual Activity  . Alcohol use: No  . Drug use: No  . Sexual activity: Not on file  Lifestyle  . Physical activity:    Days per week: Not on file    Minutes per session: Not on file  . Stress: Not on file  Relationships  . Social connections:    Talks on phone: Not on file    Gets together: Not on file    Attends religious service: Not on file    Active member of club or organization: Not on file    Attends meetings of clubs or organizations: Not on file    Relationship status: Not on file  . Intimate partner violence:    Fear of current or ex partner: Not on file    Emotionally abused: Not on file    Physically abused: Not on file    Forced sexual activity: Not on file  Other Topics Concern  . Not on  file  Social History Narrative  . Not on file    Family History:    Family History  Problem Relation Age of Onset  . Arthritis Father   . Hypertension Paternal Uncle   . Heart attack Neg Hx   . Stroke Neg Hx      ROS:  Please see the history of present illness.  Review of Systems  Constitutional: Negative for chills, fever and malaise/fatigue.  HENT: Negative for ear pain and hearing loss.   Eyes: Negative for blurred vision and pain.  Respiratory: Positive for cough and sputum production. Negative for shortness of breath and wheezing.   Cardiovascular: Positive for leg swelling.  Negative for chest pain, palpitations, orthopnea, claudication and PND.  Gastrointestinal: Negative for abdominal pain, blood in stool and melena.  Genitourinary: Negative for dysuria and hematuria.  Musculoskeletal: Negative for falls and myalgias.  Skin: Negative for rash.  Neurological: Negative for focal weakness and loss of consciousness.  Endo/Heme/Allergies: Does not bruise/bleed easily.   All other ROS reviewed and negative.     Physical Exam/Data:   Vitals:   06/04/18 0443 06/04/18 0817 06/04/18 0924 06/04/18 1220  BP: (!) 156/71 (!) 164/81 (!) 144/77 (!) 150/76  Pulse: 65 62 60 (!) 45  Resp: 19 18  18   Temp: 97.8 F (36.6 C) 98 F (36.7 C)  98.2 F (36.8 C)  TempSrc: Oral Oral  Oral  SpO2: 95% 94%  96%  Weight:      Height:        Intake/Output Summary (Last 24 hours) at 06/04/2018 1558 Last data filed at 06/04/2018 1554 Gross per 24 hour  Intake 840 ml  Output 3700 ml  Net -2860 ml   Filed Weights   06/04/18 0039  Weight: 68.9 kg   Body mass index is 22.43 kg/m.  General:  Well nourished, well developed, in no acute distress HEENT: normal Lymph: no adenopathy Neck: JVD to upper neck at 60 degrees Endocrine:  No thryomegaly Vascular: No carotid bruits; FA pulses 2+ bilaterally without bruits  Cardiac:  normal S1, S2; RRR; no murmur Lungs:  Clear at top of lung  fields, but dull at bilateral bases about 1/4 of the way up the back Abd: soft, nontender, no hepatomegaly  Ext: 2+ pitting edema bilaterally to knees and slightly above Musculoskeletal:  No deformities, BUE and BLE strength normal and equal Skin: warm and dry  Neuro:  CNs 2-12 intact, no focal abnormalities noted Psych:  Normal affect   EKG:  The EKG was personally reviewed and demonstrates:  Atrial fibrillation (favor over flutter) with LBBB Telemetry:  Telemetry was personally reviewed and demonstrates:  Atrial fibrillation (over flutter), rate controlled  Relevant CV Studies: Echo 06/04/18 My personal read: Study Conclusions  - Left ventricle: The cavity size was normal. Systolic function was   severely reduced. The estimated ejection fraction was in the   range of 25% to 30%. Diffuse hypokinesis with worse hypokinesis   at the apex. - Ventricular septum: Septal motion showed abnormal function and   dyssynergy. These changes are consistent with a left bundle   branch block. - Aortic valve: There was trivial regurgitation. - Mitral valve: There was mild regurgitation. - Left atrium: The atrium was moderately to severely dilated. - Right ventricle: The cavity size was mildly to moderately   dilated. Wall thickness was normal. Systolic function was mildly   to moderately reduced. - Right atrium: The atrium was moderately to severely dilated. - Atrial septum: No defect or patent foramen ovale was identified. - Tricuspid valve: There was moderate regurgitation. - Pulmonary arteries: Systolic pressure was severely increased. PA   peak pressure: 78 mm Hg (S). - Pericardium, extracardiac: There was a left pleural effusion.  Impressions:  - Severely reduced LVEF with global hypokinesis, visually worse at   the apex, and dyssynchrony. Biatrial enlargement. Severe   pulmonary hypertension with PASP 78 mmHg. Left pleural effusion.  Laboratory Data:  Chemistry Recent Labs  Lab  06/03/18 2112 06/04/18 0614  NA 137 138  K 3.9 3.1*  CL 103 103  CO2 25 26  GLUCOSE 131* 105*  BUN 14 9  CREATININE 0.94 0.88  CALCIUM 9.6 9.1  GFRNONAA >60 >60  GFRAA >60 >60  ANIONGAP 9 9    No results for input(s): PROT, ALBUMIN, AST, ALT, ALKPHOS, BILITOT in the last 168 hours. Hematology Recent Labs  Lab 06/03/18 2112 06/04/18 0614  WBC 4.7 3.3*  RBC 3.79* 3.30*  HGB 12.9* 11.1*  HCT 40.1 33.9*  MCV 105.8* 102.7*  MCH 34.0 33.6  MCHC 32.2 32.7  RDW 13.6 13.3  PLT 106* 97*   Cardiac Enzymes Recent Labs  Lab 06/04/18 1051  TROPONINI <0.03    Recent Labs  Lab 06/03/18 2132  TROPIPOC 0.03    BNP Recent Labs  Lab 06/03/18 2112  BNP 1,225.8*    DDimer No results for input(s): DDIMER in the last 168 hours.  Radiology/Studies:  Dg Chest 2 View  Result Date: 06/03/2018 CLINICAL DATA:  Short of breath EXAM: CHEST - 2 VIEW COMPARISON:  06/03/2018, 06/13/2012 FINDINGS: Left greater than right pleural effusions, slightly increased on the right. Stable cardiomegaly. Bibasilar airspace disease. No pneumothorax. IMPRESSION: Small left greater than right bilateral pleural effusions, slightly increased on the right side compared with prior radiograph. Cardiomegaly. Bibasilar airspace disease may reflect atelectasis or pneumonia. Electronically Signed   By: Donavan Foil M.D.   On: 06/03/2018 21:23    Assessment and Plan:   1. Acute on chronic systolic and diastolic heart failure with severe pulmonary hypertension:  He is already net negative nearly 3 liters, but he has significantly more fluid to go. He is surprisingly not short of breath despite his bilateral pleural effusions, but I wonder if a lack of physical activity is contributing to this.  Encouraged him to ambulate with assistance (family is very present) to try to mobilize fluid. He will continue to need diuresis and potassium supplementation to remove fluid.  His right sided filling pressures were very  high. Once he is euvolemic may be of benefit to check a limited study just to determine filling pressures to see if this is all fluid or if there is a residual etiology.  Beta blocker on hold given bradycardia. Continue lisinopril.  2. Atrial fibrillation/flutter: no immediate plans for procedure, continue anticoagulation. Beta blocker had to be held due to bradycardia, will add this back when able.   3. History of CAD: no active chest pain, no real change in his wall motion -continue atorvastatin 40 mg daily -no aspirin as he is on apixaban  4. Hypertension: suspect that this may be driven by fluid as well. Has room to uptitrate lisinopril in the long term, but given fluid, hypokalemia, and history of CAD, will start spironolactone as well.   We will continue to follow.  For questions or updates, please contact [place] Please consult [email or web address] for contact info under Cardiology/STEMI.   Signed, Buford Dresser, MD  06/04/2018 3:58 PM

## 2018-06-04 NOTE — Plan of Care (Signed)
  Problem: Education: Goal: Knowledge of General Education information will improve Description Including pain rating scale, medication(s)/side effects and non-pharmacologic comfort measures Outcome: Progressing   Problem: Activity: Goal: Risk for activity intolerance will decrease Outcome: Progressing   Problem: Nutrition: Goal: Adequate nutrition will be maintained Outcome: Progressing   Problem: Education: Goal: Ability to verbalize understanding of medication therapies will improve Outcome: Progressing

## 2018-06-04 NOTE — Progress Notes (Signed)
PROGRESS NOTE    Parker Odonnell.  XAJ:[id number] DOB: May 23, 1932 DOA: 06/03/2018 PCP: Lawerance Cruel, MD    Brief Narrative: Parker Odonnell. is a 84 y.o. male with medical history significant for paroxysmal atrial fibrillation on Eliquis, coronary artery disease, hypertension, chronic left bundle branch block, and chronic thrombocytopenia, now presenting to the emergency department for evaluation of shortness of breath and bilateral leg swelling.  Patient is accompanied by his daughter who assist with the history.  He has been noted to have worsening bilateral lower extremity edema for at least 2 weeks, but had not been particularly short of breath until several days ago.  For the past several days, he has had shortness of breath, initially with exertion, but now at rest also.  There has been a mild cough, occasionally productive of thick yellow sputum.  No chest pain.  He also denies fevers or chills.  He had an echocardiogram in March with EF 35 to 40%.    ED Course: Upon arrival to the ED, patient is found to be afebrile, saturating adequately on room air while at rest, tachypneic, and hypertensive.  EKG features atrial flutter with chronic left bundle branch block.  Chest x-ray is notable for cardiomegaly, small bilateral pleural effusions, and bibasilar airspace disease likely reflecting atelectasis or possibly pneumonia.  Chemistry panel is unremarkable and CBC is notable for a worsened thrombocytopenia with platelets 106,000.  Troponin is normal and BNP is elevated at 1226.  Patient was treated with 20 mg IV Lasix in the ED.  He has begun to diurese, but remains short of breath at rest and will be admitted for ongoing evaluation and management.   Assessment & Plan:   Principal Problem:   Acute on chronic systolic CHF (congestive heart failure) (HCC) Active Problems:   Coronary atherosclerosis of native coronary artery   Essential hypertension   Thrombocytopenia (HCC)   Atrial  flutter (HCC)   1-Acute systolic Heart failure exacerbation;  Last EF 35-40%  Started on IV lasix.  Discontinue HCTZ.  On lisinopril.  Repeat ECHO.  Bradycardia, will hold carvedilol. Replete mg and k. Asymptomatic.  Cardiology consulted.  Dry weight 160--Weight 168---  CAD; on lisinopril.  Denies chest pain. Cycle cardiac enzymes.  Repeat ECHO.  Continue with statins.   Paroxysmal A fib;  Continue with eliquis.  Hold carvedilol due to bradycardia.   HTN;contionue with lasix, lisinopril.  Await cardiology evaluation.   Thrombocytopenia;  B 12 low normal start supplement.   Hypokalemia; replete orally/.     DVT prophylaxis: Eliquis  Code Status: full code.  Family Communication: Son at bedside.  Disposition Plan:   Consultants:   Cardiology    Procedures:  ECHO pending.    Antimicrobials: none   Subjective: He is breathing better. His dry weight is 161 pounds..  He denies chest pain at rest on on exertion.   Objective: Vitals:   06/04/18 0000 06/04/18 0039 06/04/18 0107 06/04/18 0443  BP: (!) 145/95 (!) 160/83  (!) 156/71  Pulse: (!) 57 63  65  Resp: 18 19  19   Temp:  97.6 F (36.4 C)  97.8 F (36.6 C)  TempSrc:  Oral  Oral  SpO2: 100% 100%  95%  Weight:  68.9 kg    Height:   5\' 9"  (1.753 m)     Intake/Output Summary (Last 24 hours) at 06/04/2018 0721 Last data filed at 06/04/2018 0441 Gross per 24 hour  Intake 240 ml  Output 1200 ml  Net -960 ml   Filed Weights   06/04/18 0039  Weight: 68.9 kg    Examination:  General exam: Appears calm and comfortable  Respiratory system: Clear to auscultation. Respiratory effort normal. Cardiovascular system: S1 & S2 heard, RRR. No JVD, murmurs, rubs, gallops or clicks. Gastrointestinal system: Abdomen is nondistended, soft and nontender. No organomegaly or masses felt. Normal bowel sounds heard. Central nervous system: Alert and oriented. No focal neurological deficits. Extremities: Symmetric 5  x 5 power. Plus 2 edema Skin: No rashes, lesions or ulcers Psychiatry: Judgement and insight appear normal. Mood & affect appropriate.     Data Reviewed: I have personally reviewed following labs and imaging studies  CBC: Recent Labs  Lab 06/03/18 2112  WBC 4.7  HGB 12.9*  HCT 40.1  MCV 105.8*  PLT 782*   Basic Metabolic Panel: Recent Labs  Lab 06/03/18 2112  NA 137  K 3.9  CL 103  CO2 25  GLUCOSE 131*  BUN 14  CREATININE 0.94  CALCIUM 9.6   GFR: Estimated Creatinine Clearance: 58 mL/min (by C-G formula based on SCr of 0.94 mg/dL). Liver Function Tests: No results for input(s): AST, ALT, ALKPHOS, BILITOT, PROT, ALBUMIN in the last 168 hours. No results for input(s): LIPASE, AMYLASE in the last 168 hours. No results for input(s): AMMONIA in the last 168 hours. Coagulation Profile: No results for input(s): INR, PROTIME in the last 168 hours. Cardiac Enzymes: No results for input(s): CKTOTAL, CKMB, CKMBINDEX, TROPONINI in the last 168 hours. BNP (last 3 results) No results for input(s): PROBNP in the last 8760 hours. HbA1C: No results for input(s): HGBA1C in the last 72 hours. CBG: No results for input(s): GLUCAP in the last 168 hours. Lipid Profile: No results for input(s): CHOL, HDL, LDLCALC, TRIG, CHOLHDL, LDLDIRECT in the last 72 hours. Thyroid Function Tests: No results for input(s): TSH, T4TOTAL, FREET4, T3FREE, THYROIDAB in the last 72 hours. Anemia Panel: No results for input(s): VITAMINB12, FOLATE, FERRITIN, TIBC, IRON, RETICCTPCT in the last 72 hours. Sepsis Labs: No results for input(s): PROCALCITON, LATICACIDVEN in the last 168 hours.  No results found for this or any previous visit (from the past 240 hour(s)).       Radiology Studies: Dg Chest 2 View  Result Date: 06/03/2018 CLINICAL DATA:  Short of breath EXAM: CHEST - 2 VIEW COMPARISON:  06/03/2018, 06/13/2012 FINDINGS: Left greater than right pleural effusions, slightly increased on the  right. Stable cardiomegaly. Bibasilar airspace disease. No pneumothorax. IMPRESSION: Small left greater than right bilateral pleural effusions, slightly increased on the right side compared with prior radiograph. Cardiomegaly. Bibasilar airspace disease may reflect atelectasis or pneumonia. Electronically Signed   By: Donavan Foil M.D.   On: 06/03/2018 21:23        Scheduled Meds: . apixaban  5 mg Oral BID  . atorvastatin  40 mg Oral q1800  . carvedilol  3.125 mg Oral BID WC  . furosemide  20 mg Intravenous Q12H  . lisinopril  20 mg Oral Daily   And  . hydrochlorothiazide  12.5 mg Oral Daily  . nitrofurantoin (macrocrystal-monohydrate)  100 mg Oral BID  . omega-3 acid ethyl esters  1 g Oral BID  . potassium chloride SA  20 mEq Oral Daily  . sodium chloride flush  3 mL Intravenous Q12H   Continuous Infusions: . sodium chloride       LOS: 1 day    Time spent: 35 minutes.     Elmarie Shiley, MD [facility]  Pager [phone]  If 7PM-7AM, please contact night-coverage [email or web address] Password [facility] 06/04/2018, 7:21 AM

## 2018-06-04 NOTE — Evaluation (Signed)
Physical Therapy Evaluation Patient Details Name: Parker Odonnell. MRN: [id number] DOB: [birth year] Today's Date: 06/04/2018   History of Present Illness  Pt is an 84 y.o. male with medical history significant for paroxysmal atrial fibrillation on Eliquis, coronary artery disease, hypertension, chronic left bundle branch block, and chronic thrombocytopenia. He presented to the emergency department for evaluation of shortness of breath and bilateral leg swelling.    Clinical Impression  Pt admitted with above diagnosis. Pt currently with functional limitations due to the deficits listed below (see PT Problem List). On eval, pt required supervision transfers and min guard assist ambulation 175 feet with cane. Pt will benefit from skilled PT to increase their independence and safety with mobility to allow discharge to the venue listed below.       Follow Up Recommendations Home health PT;Supervision - Intermittent(return to PT at [facility], may be considered OPPT)    Equipment Recommendations  None recommended by PT    Recommendations for Other Services       Precautions / Restrictions        Mobility  Bed Mobility Overal bed mobility: Modified Independent             General bed mobility comments: +rail, increased time and effort  Transfers Overall transfer level: Needs assistance Equipment used: Straight cane Transfers: Sit to/from Stand Sit to Stand: Supervision         General transfer comment: increased time to stabilize initial standing balance  Ambulation/Gait Ambulation/Gait assistance: Min guard Gait Distance (Feet): 175 Feet Assistive device: Straight cane Gait Pattern/deviations: Step-through pattern;Decreased stride length Gait velocity: decreased Gait velocity interpretation: 1.31 - 2.62 ft/sec, indicative of limited community ambulator General Gait Details: steady gait/no LOB, min guard for safety, no SOB noted. Pt able to participate in short  conversations during ambulaiton.  Stairs            Wheelchair Mobility    Modified Rankin (Stroke Patients Only)       Balance Overall balance assessment: Mild deficits observed, not formally tested                                           Pertinent Vitals/Pain Pain Assessment: No/denies pain    Home Living Family/patient expects to be discharged to:: Private residence([facility]) Living Arrangements: Alone Available Help at Discharge: Available PRN/intermittently;Family Type of Home: Independent living facility Home Access: Elevator     Home Layout: One level Home Equipment: Cane - single point;Grab bars - tub/shower;Grab bars - toilet;Hand held shower head;Shower seat      Prior Function Level of Independence: Independent with assistive device(s)         Comments: cane for ambulation. Pt ambulates to the dining room for all meals. Pt was active with PT/OT at [facility] at time of admission.     Hand Dominance   Dominant Hand: Right    Extremity/Trunk Assessment   Upper Extremity Assessment Upper Extremity Assessment: Overall WFL for tasks assessed    Lower Extremity Assessment Lower Extremity Assessment: Overall WFL for tasks assessed    Cervical / Trunk Assessment Cervical / Trunk Assessment: Kyphotic  Communication   Communication: No difficulties  Cognition Arousal/Alertness: Awake/alert Behavior During Therapy: WFL for tasks assessed/performed Overall Cognitive Status: Within Functional Limits for tasks assessed  General Comments      Exercises     Assessment/Plan    PT Assessment Patient needs continued PT services  PT Problem List Decreased mobility;Decreased activity tolerance;Decreased balance       PT Treatment Interventions Therapeutic activities;Gait training;Therapeutic exercise;Patient/family education;Balance training;Functional  mobility training    PT Goals (Current goals can be found in the Care Plan section)  Acute Rehab PT Goals Patient Stated Goal: feel better PT Goal Formulation: With patient/family Time For Goal Achievement: 06/18/18 Potential to Achieve Goals: Good    Frequency Min 3X/week   Barriers to discharge        Co-evaluation               AM-PAC PT "6 Clicks" Daily Activity  Outcome Measure Difficulty turning over in bed (including adjusting bedclothes, sheets and blankets)?: A Little Difficulty moving from lying on back to sitting on the side of the bed? : A Little Difficulty sitting down on and standing up from a chair with arms (e.g., wheelchair, bedside commode, etc,.)?: A Little Help needed moving to and from a bed to chair (including a wheelchair)?: None Help needed walking in hospital room?: None Help needed climbing 3-5 steps with a railing? : A Little 6 Click Score: 20    End of Session Equipment Utilized During Treatment: Gait belt Activity Tolerance: Patient tolerated treatment well Patient left: in bed;with call bell/phone within reach;with bed alarm set;with family/visitor present Nurse Communication: Mobility status PT Visit Diagnosis: Difficulty in walking, not elsewhere classified (R26.2)    Time: 1478-2956 PT Time Calculation (min) (ACUTE ONLY): 32 min   Charges:   PT Evaluation $PT Eval Low Complexity: 1 Low PT Treatments $Gait Training: 8-22 mins        Lorrin Goodell, PT  Office # [phone] Pager [phone]   Lorriane Shire 06/04/2018, 12:30 PM

## 2018-06-05 DIAGNOSIS — R600 Localized edema: Secondary | ICD-10-CM

## 2018-06-05 DIAGNOSIS — R0602 Shortness of breath: Secondary | ICD-10-CM

## 2018-06-05 DIAGNOSIS — J90 Pleural effusion, not elsewhere classified: Secondary | ICD-10-CM

## 2018-06-05 LAB — CBC
HEMATOCRIT: 37.6 % — AB (ref 39.0–52.0)
HEMOGLOBIN: 12.4 g/dL — AB (ref 13.0–17.0)
MCH: 34.3 pg — ABNORMAL HIGH (ref 26.0–34.0)
MCHC: 33 g/dL (ref 30.0–36.0)
MCV: 103.9 fL — AB (ref 78.0–100.0)
Platelets: 115 10*3/uL — ABNORMAL LOW (ref 150–400)
RBC: 3.62 MIL/uL — AB (ref 4.22–5.81)
RDW: 13.4 % (ref 11.5–15.5)
WBC: 4.3 10*3/uL (ref 4.0–10.5)

## 2018-06-05 LAB — BASIC METABOLIC PANEL
Anion gap: 7 (ref 5–15)
BUN: 10 mg/dL (ref 8–23)
CHLORIDE: 103 mmol/L (ref 98–111)
CO2: 30 mmol/L (ref 22–32)
Calcium: 9.3 mg/dL (ref 8.9–10.3)
Creatinine, Ser: 0.97 mg/dL (ref 0.61–1.24)
GFR calc non Af Amer: >60 mL/min (ref >60)
Glucose, Bld: 98 mg/dL (ref 70–99)
POTASSIUM: 3.4 mmol/L — AB (ref 3.5–5.1)
Sodium: 140 mmol/L (ref 135–145)

## 2018-06-05 MED ORDER — VITAMIN B-12 100 MCG PO TABS
100.0000 ug | ORAL_TABLET | Freq: Every day | ORAL | Status: DC
  Administered 2018-06-05 – 2018-06-08 (×4): 100 ug via ORAL
  Filled 2018-06-05 (×4): qty 1

## 2018-06-05 MED ORDER — POTASSIUM CHLORIDE CRYS ER 20 MEQ PO TBCR
40.0000 meq | EXTENDED_RELEASE_TABLET | Freq: Two times a day (BID) | ORAL | Status: DC
  Administered 2018-06-05 – 2018-06-06 (×3): 40 meq via ORAL
  Filled 2018-06-05 (×3): qty 2

## 2018-06-05 NOTE — Discharge Instructions (Signed)
Apixaban oral tablets °What is this medicine? °APIXABAN (a PIX a ban) is an anticoagulant (blood thinner). It is used to lower the chance of stroke in people with a medical condition called atrial fibrillation. It is also used to treat or prevent blood clots in the lungs or in the veins. °This medicine may be used for other purposes; ask your health care provider or pharmacist if you have questions. °COMMON BRAND NAME(S): Eliquis °What should I tell my health care provider before I take this medicine? °They need to know if you have any of these conditions: °-bleeding disorders °-bleeding in the brain °-blood in your stools (black or tarry stools) or if you have blood in your vomit °-history of stomach bleeding °-kidney disease °-liver disease °-mechanical heart valve °-an unusual or allergic reaction to apixaban, other medicines, foods, dyes, or preservatives °-pregnant or trying to get pregnant °-breast-feeding °How should I use this medicine? °Take this medicine by mouth with a glass of water. Follow the directions on the prescription label. You can take it with or without food. If it upsets your stomach, take it with food. Take your medicine at regular intervals. Do not take it more often than directed. Do not stop taking except on your doctor's advice. Stopping this medicine may increase your risk of a blot clot. Be sure to refill your prescription before you run out of medicine. °Talk to your pediatrician regarding the use of this medicine in children. Special care may be needed. °Overdosage: If you think you have taken too much of this medicine contact a poison control center or emergency room at once. °NOTE: This medicine is only for you. Do not share this medicine with others. °What if I miss a dose? °If you miss a dose, take it as soon as you can. If it is almost time for your next dose, take only that dose. Do not take double or extra doses. °What may interact with this medicine? °This medicine may  interact with the following: °-aspirin and aspirin-like medicines °-certain medicines for fungal infections like ketoconazole and itraconazole °-certain medicines for seizures like carbamazepine and phenytoin °-certain medicines that treat or prevent blood clots like warfarin, enoxaparin, and dalteparin °-clarithromycin °-NSAIDs, medicines for pain and inflammation, like ibuprofen or naproxen °-rifampin °-ritonavir °-St. Kadrian's wort °This list may not describe all possible interactions. Give your health care provider a list of all the medicines, herbs, non-prescription drugs, or dietary supplements you use. Also tell them if you smoke, drink alcohol, or use illegal drugs. Some items may interact with your medicine. °What should I watch for while using this medicine? °Visit your doctor or health care professional for regular checks on your progress. °Notify your doctor or health care professional and seek emergency treatment if you develop breathing problems; changes in vision; chest pain; severe, sudden headache; pain, swelling, warmth in the leg; trouble speaking; sudden numbness or weakness of the face, arm or leg. These can be signs that your condition has gotten worse. °If you are going to have surgery or other procedure, tell your doctor that you are taking this medicine. °What side effects may I notice from receiving this medicine? °Side effects that you should report to your doctor or health care professional as soon as possible: °-allergic reactions like skin rash, itching or hives, swelling of the face, lips, or tongue °-signs and symptoms of bleeding such as bloody or black, tarry stools; red or dark-brown urine; spitting up blood or brown material that looks like coffee   grounds; red spots on the skin; unusual bruising or bleeding from the eye, gums, or nose °This list may not describe all possible side effects. Call your doctor for medical advice about side effects. You may report side effects to FDA at  1-800-FDA-1088. °Where should I keep my medicine? °Keep out of the reach of children. °Store at room temperature between 20 and 25 degrees C (68 and 77 degrees F). Throw away any unused medicine after the expiration date. °NOTE: This sheet is a summary. It may not cover all possible information. If you have questions about this medicine, talk to your doctor, pharmacist, or health care provider. °© 2018 [employer]vier/Gold Standard (2016-04-27 11:54:23) °  ° °

## 2018-06-05 NOTE — Progress Notes (Signed)
MD Harrell Gave aware of pt bradycardia non sustained

## 2018-06-05 NOTE — Progress Notes (Signed)
Progress Note  Patient Name: Parker Odonnell. Date of Encounter: 06/05/2018  Primary Cardiologist: Larae Grooms, MD   Subjective   Patient feeling tired but overall doing well. Has had brisk urine output. Has been trying to ambulate intermittently. Son present today at bedside, all questions answered.  Inpatient Medications    Scheduled Meds: . apixaban  5 mg Oral BID  . atorvastatin  40 mg Oral q1800  . furosemide  20 mg Intravenous Q12H  . lisinopril  20 mg Oral Daily  . nitrofurantoin (macrocrystal-monohydrate)  100 mg Oral BID  . omega-3 acid ethyl esters  1 g Oral BID  . potassium chloride SA  40 mEq Oral BID  . sodium chloride flush  3 mL Intravenous Q12H  . spironolactone  12.5 mg Oral Daily   Continuous Infusions: . sodium chloride     PRN Meds: sodium chloride, acetaminophen, ondansetron (ZOFRAN) IV, polyvinyl alcohol, sodium chloride flush   Vital Signs    Vitals:   06/04/18 2041 06/05/18 0011 06/05/18 0413 06/05/18 0816  BP:  (!) 155/76 (!) 153/71 (!) 158/80  Pulse: 65 65 68 70  Resp:  18 19   Temp:  97.6 F (36.4 C) 97.7 F (36.5 C)   TempSrc:  Oral Oral   SpO2: 97% 96% 96%   Weight:   62.8 kg   Height:        Intake/Output Summary (Last 24 hours) at 06/05/2018 1049 Last data filed at 06/05/2018 1001 Gross per 24 hour  Intake 1300 ml  Output 4700 ml  Net -3400 ml   Filed Weights   06/04/18 0039 06/05/18 0413  Weight: 68.9 kg 62.8 kg    Telemetry     atrial fibrillation, lowest rates in the 40s - Personally Reviewed  ECG    No new since yesterday - Personally Reviewed  Physical Exam   GEN: No acute distress.  Resting comfortably in bed. Neck: supple, JVD to low neck Cardiac: irregularly irregular, bradycardic S1 and S2, no appreciable m/r/g Respiratory: dull at bilateral bases but improved slightly from yesterday GI: Soft, nontender, non-distended. Bowel sounds normal MS: 1-2+ pitting edema to low calf, improvement from  yesterday; No deformity. Neuro:  Nonfocal, moves all limbs independently Psych: Normal affect   Labs    Chemistry Recent Labs  Lab 06/03/18 2112 06/04/18 0614 06/05/18 0449  NA 137 138 140  K 3.9 3.1* 3.4*  CL 103 103 103  CO2 25 26 30   GLUCOSE 131* 105* 98  BUN 14 9 10   CREATININE 0.94 0.88 0.97  CALCIUM 9.6 9.1 9.3  GFRNONAA >60 >60 >60  GFRAA >60 >60 >60  ANIONGAP 9 9 7      Hematology Recent Labs  Lab 06/03/18 2112 06/04/18 0614 06/05/18 0449  WBC 4.7 3.3* 4.3  RBC 3.79* 3.30* 3.62*  HGB 12.9* 11.1* 12.4*  HCT 40.1 33.9* 37.6*  MCV 105.8* 102.7* 103.9*  MCH 34.0 33.6 34.3*  MCHC 32.2 32.7 33.0  RDW 13.6 13.3 13.4  PLT 106* 97* 115*    Cardiac Enzymes Recent Labs  Lab 06/04/18 1051 06/04/18 1624 06/04/18 2201  TROPONINI <0.03 <0.03 <0.03    Recent Labs  Lab 06/03/18 2132  TROPIPOC 0.03     BNP Recent Labs  Lab 06/03/18 2112  BNP 1,225.8*     DDimer No results for input(s): DDIMER in the last 168 hours.   Radiology    Dg Chest 2 View  Result Date: 06/03/2018 CLINICAL DATA:  Short of breath  EXAM: CHEST - 2 VIEW COMPARISON:  06/03/2018, 06/13/2012 FINDINGS: Left greater than right pleural effusions, slightly increased on the right. Stable cardiomegaly. Bibasilar airspace disease. No pneumothorax. IMPRESSION: Small left greater than right bilateral pleural effusions, slightly increased on the right side compared with prior radiograph. Cardiomegaly. Bibasilar airspace disease may reflect atelectasis or pneumonia. Electronically Signed   By: Donavan Foil M.D.   On: 06/03/2018 21:23    Cardiac Studies   Echo personally read 06/04/18 Study Conclusions  - Left ventricle: The cavity size was normal. Systolic function was severely reduced. The estimated ejection fraction was in the range of 25% to 30%. Diffuse hypokinesis with worse hypokinesis at the apex. - Ventricular septum: Septal motion showed abnormal function and dyssynergy.  These changes are consistent with a left bundle branch block. - Aortic valve: There was trivial regurgitation. - Mitral valve: There was mild regurgitation. - Left atrium: The atrium was moderately to severely dilated. - Right ventricle: The cavity size was mildly to moderately dilated. Wall thickness was normal. Systolic function was mildly to moderately reduced. - Right atrium: The atrium was moderately to severely dilated. - Atrial septum: No defect or patent foramen ovale was identified. - Tricuspid valve: There was moderate regurgitation. - Pulmonary arteries: Systolic pressure was severely increased. PA peak pressure: 78 mm Hg (S). - Pericardium, extracardiac: There was a left pleural effusion.  Impressions:  - Severely reduced LVEF with global hypokinesis, visually worse at the apex, and dyssynchrony. Biatrial enlargement. Severe pulmonary hypertension with PASP 78 mmHg. Left pleural effusion.  Patient Profile     84 y.o. male with PMH CAD s/p LAD stent in 0973, chronic systolic and diastolic heart failure with acute exacerbation, atrial fibrillation/atrial flutter on anticoagulation who is admitted for acute on chronic systolic and diastolic heart failure.  Assessment & Plan    1. Acute on chronic systolic and diastolic heart failure with severe pulmonary hypertension: -diuresing briskly, with improvements on physical exam today. Kidney function stable, K low today -reports not short of breath despite his bilateral pleural effusions, but I wonder if a lack of physical activity is contributing to this. -Encouraged him to ambulate with assistance (family is very present) to try to mobilize fluid. -His right sided filling pressures were very high. Once he is euvolemic may be of benefit to check a limited study just to determine filling pressures to see if this is all fluid or if there is a residual etiology. -Beta blocker on hold given bradycardia. Continue  lisinopril.  2. Atrial fibrillation/flutter: no immediate plans for procedure, continue anticoagulation. Beta blocker had to be held due to bradycardia, will add this back if able.   3. History of CAD: no active chest pain, no real change in his wall motion -continue atorvastatin 40 mg daily -no aspirin as he is on apixaban  4. Hypertension: suspect that this may be driven by fluid as well. -has room to uptitrate lisinopril in the long term, but given fluid, hypokalemia, and history of CAD, started spironolactone this admission.  -if renal function remains stable, will increase lisinopril vs. Change to entresto as an option  We will continue to follow.   Time Spent Directly with Patient: I have spent a total of 35 minutes with the patient reviewing hospital notes, telemetry, EKGs, labs and examining the patient as well as establishing an assessment and plan that was discussed personally with the patient.  > 50% of time was spent in direct patient care.  Length of Stay:  LOS: 2 days   Buford Dresser, MD, PhD [facility]   06/05/2018, 10:49 AM      For questions or updates, please contact [place] Please consult [email or web address] for contact info under Cardiology/STEMI.

## 2018-06-05 NOTE — Progress Notes (Signed)
PROGRESS NOTE    Parker Odonnell.  YQM:[id number] DOB: [birth year] DOA: 06/03/2018 PCP: Lawerance Cruel, MD    Brief Narrative: Parker Odonnell. is a 84 y.o. male with medical history significant for paroxysmal atrial fibrillation on Eliquis, coronary artery disease, hypertension, chronic left bundle branch block, and chronic thrombocytopenia, now presenting to the emergency department for evaluation of shortness of breath and bilateral leg swelling.  Patient is accompanied by his daughter who assist with the history.  He has been noted to have worsening bilateral lower extremity edema for at least 2 weeks, but had not been particularly short of breath until several days ago.  For the past several days, he has had shortness of breath, initially with exertion, but now at rest also.  There has been a mild cough, occasionally productive of thick yellow sputum.  No chest pain.  He also denies fevers or chills.  He had an echocardiogram in March with EF 35 to 40%.    ED Course: Upon arrival to the ED, patient is found to be afebrile, saturating adequately on room air while at rest, tachypneic, and hypertensive.  EKG features atrial flutter with chronic left bundle branch block.  Chest x-ray is notable for cardiomegaly, small bilateral pleural effusions, and bibasilar airspace disease likely reflecting atelectasis or possibly pneumonia.  Chemistry panel is unremarkable and CBC is notable for a worsened thrombocytopenia with platelets 106,000.  Troponin is normal and BNP is elevated at 1226.  Patient was treated with 20 mg IV Lasix in the ED.  He has begun to diurese, but remains short of breath at rest and will be admitted for ongoing evaluation and management.   Assessment & Plan:   Principal Problem:   Acute on chronic systolic CHF (congestive heart failure) (HCC) Active Problems:   Coronary atherosclerosis of native coronary artery   Essential hypertension   Thrombocytopenia (HCC)   Atrial  flutter (HCC)   Shortness of breath   Bilateral pleural effusion   1-Acute systolic Heart failure exacerbation;  Last EF 35-40%  Started on IV lasix.  Discontinue HCTZ.  On lisinopril.  Repeat ECHO. Lower EF 25--30 % Bradycardia, will hold carvedilol. Replete mg and k. Asymptomatic.  Cardiology consulted and following. .  Dry weight 160--Weight 168--- Negative 4 L. Urine out put 5.3 L yesterday.   CAD; on lisinopril.  Denies chest pain. Cycle cardiac enzymes.  ECHO lower Ef 25 % Continue with statins.   Paroxysmal A fib;  Continue with eliquis.  Hold carvedilol due to bradycardia.   HTN;contionue with lasix, lisinopril.  Await cardiology evaluation.   Thrombocytopenia;  B 12 low normal start supplement.   Hypokalemia; replete orally/. Increase kcl to 40 meq BID    DVT prophylaxis: Eliquis  Code Status: full code.  Family Communication: Son at bedside.  Disposition Plan:   Consultants:   Cardiology    Procedures:  ECHO EF 25 %   Antimicrobials: none   Subjective: He is doing well, breathing better.   Objective: Vitals:   06/04/18 2041 06/05/18 0011 06/05/18 0413 06/05/18 0816  BP:  (!) 155/76 (!) 153/71 (!) 158/80  Pulse: 65 65 68 70  Resp:  18 19   Temp:  97.6 F (36.4 C) 97.7 F (36.5 C)   TempSrc:  Oral Oral   SpO2: 97% 96% 96%   Weight:   62.8 kg   Height:        Intake/Output Summary (Last 24 hours) at 06/05/2018 1119 Last data  filed at 06/05/2018 1001 Gross per 24 hour  Intake 1300 ml  Output 4700 ml  Net -3400 ml   Filed Weights   06/04/18 0039 06/05/18 0413  Weight: 68.9 kg 62.8 kg    Examination:  General exam: NAD Respiratory system: CTA Cardiovascular system; S 1, S 2 RRR Gastrointestinal system: BS present, soft, nt Central nervous system: non focal.  Extremities: symmetric power. Trace edema Skin: no rashes.    Data Reviewed: I have personally reviewed following labs and imaging studies  CBC: Recent Labs  Lab  06/03/18 2112 06/04/18 0614 06/05/18 0449  WBC 4.7 3.3* 4.3  HGB 12.9* 11.1* 12.4*  HCT 40.1 33.9* 37.6*  MCV 105.8* 102.7* 103.9*  PLT 106* 97* 409*   Basic Metabolic Panel: Recent Labs  Lab 06/03/18 2112 06/04/18 0614 06/04/18 1051 06/05/18 0449  NA 137 138  --  140  K 3.9 3.1*  --  3.4*  CL 103 103  --  103  CO2 25 26  --  30  GLUCOSE 131* 105*  --  98  BUN 14 9  --  10  CREATININE 0.94 0.88  --  0.97  CALCIUM 9.6 9.1  --  9.3  MG  --   --  1.8  --    GFR: Estimated Creatinine Clearance: 51.3 mL/min (by C-G formula based on SCr of 0.97 mg/dL). Liver Function Tests: No results for input(s): AST, ALT, ALKPHOS, BILITOT, PROT, ALBUMIN in the last 168 hours. No results for input(s): LIPASE, AMYLASE in the last 168 hours. No results for input(s): AMMONIA in the last 168 hours. Coagulation Profile: No results for input(s): INR, PROTIME in the last 168 hours. Cardiac Enzymes: Recent Labs  Lab 06/04/18 1051 06/04/18 1624 06/04/18 2201  TROPONINI <0.03 <0.03 <0.03   BNP (last 3 results) No results for input(s): PROBNP in the last 8760 hours. HbA1C: No results for input(s): HGBA1C in the last 72 hours. CBG: No results for input(s): GLUCAP in the last 168 hours. Lipid Profile: No results for input(s): CHOL, HDL, LDLCALC, TRIG, CHOLHDL, LDLDIRECT in the last 72 hours. Thyroid Function Tests: No results for input(s): TSH, T4TOTAL, FREET4, T3FREE, THYROIDAB in the last 72 hours. Anemia Panel: Recent Labs    06/04/18 0614  VITAMINB12 210  FOLATE 16.6   Sepsis Labs: No results for input(s): PROCALCITON, LATICACIDVEN in the last 168 hours.  No results found for this or any previous visit (from the past 240 hour(s)).       Radiology Studies: Dg Chest 2 View  Result Date: 06/03/2018 CLINICAL DATA:  Short of breath EXAM: CHEST - 2 VIEW COMPARISON:  06/03/2018, 06/13/2012 FINDINGS: Left greater than right pleural effusions, slightly increased on the right. Stable  cardiomegaly. Bibasilar airspace disease. No pneumothorax. IMPRESSION: Small left greater than right bilateral pleural effusions, slightly increased on the right side compared with prior radiograph. Cardiomegaly. Bibasilar airspace disease may reflect atelectasis or pneumonia. Electronically Signed   By: Donavan Foil M.D.   On: 06/03/2018 21:23        Scheduled Meds: . apixaban  5 mg Oral BID  . atorvastatin  40 mg Oral q1800  . furosemide  20 mg Intravenous Q12H  . lisinopril  20 mg Oral Daily  . nitrofurantoin (macrocrystal-monohydrate)  100 mg Oral BID  . omega-3 acid ethyl esters  1 g Oral BID  . potassium chloride SA  40 mEq Oral BID  . sodium chloride flush  3 mL Intravenous Q12H  . spironolactone  12.5  mg Oral Daily   Continuous Infusions: . sodium chloride       LOS: 2 days    Time spent: 35 minutes.     Elmarie Shiley, MD [facility] Pager [phone]  If 7PM-7AM, please contact night-coverage [email or web address] Password [email or web address] 06/05/2018, 11:19 AM

## 2018-06-06 ENCOUNTER — Telehealth: Payer: Self-pay | Admitting: Interventional Cardiology

## 2018-06-06 DIAGNOSIS — I4892 Unspecified atrial flutter: Secondary | ICD-10-CM

## 2018-06-06 LAB — BASIC METABOLIC PANEL
ANION GAP: 8 (ref 5–15)
BUN: 10 mg/dL (ref 8–23)
CALCIUM: 9.4 mg/dL (ref 8.9–10.3)
CO2: 30 mmol/L (ref 22–32)
Chloride: 102 mmol/L (ref 98–111)
Creatinine, Ser: 0.92 mg/dL (ref 0.61–1.24)
Glucose, Bld: 103 mg/dL — ABNORMAL HIGH (ref 70–99)
Potassium: 3.8 mmol/L (ref 3.5–5.1)
SODIUM: 140 mmol/L (ref 135–145)

## 2018-06-06 LAB — CBC
HCT: 38.3 % — ABNORMAL LOW (ref 39.0–52.0)
HEMOGLOBIN: 12.6 g/dL — AB (ref 13.0–17.0)
MCH: 34.1 pg — ABNORMAL HIGH (ref 26.0–34.0)
MCHC: 32.9 g/dL (ref 30.0–36.0)
MCV: 103.8 fL — ABNORMAL HIGH (ref 78.0–100.0)
Platelets: 114 10*3/uL — ABNORMAL LOW (ref 150–400)
RBC: 3.69 MIL/uL — ABNORMAL LOW (ref 4.22–5.81)
RDW: 13.3 % (ref 11.5–15.5)
WBC: 4.9 10*3/uL (ref 4.0–10.5)

## 2018-06-06 MED ORDER — ADULT MULTIVITAMIN W/MINERALS CH
1.0000 | ORAL_TABLET | Freq: Every day | ORAL | Status: DC
  Administered 2018-06-06 – 2018-06-08 (×3): 1 via ORAL
  Filled 2018-06-06 (×3): qty 1

## 2018-06-06 MED ORDER — NITROFURANTOIN MONOHYD MACRO 100 MG PO CAPS
100.0000 mg | ORAL_CAPSULE | Freq: Two times a day (BID) | ORAL | Status: DC
  Administered 2018-06-06 – 2018-06-08 (×5): 100 mg via ORAL
  Filled 2018-06-06 (×5): qty 1

## 2018-06-06 NOTE — Progress Notes (Signed)
PROGRESS NOTE    [place].  IHK:[id number] DOB: [birth year] DOA: 06/03/2018 PCP: Lawerance Cruel, MD    Brief Narrative: Parker Odonnell. is a 84 y.o. male with medical history significant for paroxysmal atrial fibrillation on Eliquis, coronary artery disease, hypertension, chronic left bundle branch block, and chronic thrombocytopenia, now presenting to the emergency department for evaluation of shortness of breath and bilateral leg swelling.  Patient is accompanied by his daughter who assist with the history.  He has been noted to have worsening bilateral lower extremity edema for at least 2 weeks, but had not been particularly short of breath until several days ago.  For the past several days, he has had shortness of breath, initially with exertion, but now at rest also.  There has been a mild cough, occasionally productive of thick yellow sputum.  No chest pain.  He also denies fevers or chills.  He had an echocardiogram in March with EF 35 to 40%.    ED Course: Upon arrival to the ED, patient is found to be afebrile, saturating adequately on room air while at rest, tachypneic, and hypertensive.  EKG features atrial flutter with chronic left bundle branch block.  Chest x-ray is notable for cardiomegaly, small bilateral pleural effusions, and bibasilar airspace disease likely reflecting atelectasis or possibly pneumonia.  Chemistry panel is unremarkable and CBC is notable for a worsened thrombocytopenia with platelets 106,000.  Troponin is normal and BNP is elevated at 1226.  Patient was treated with 20 mg IV Lasix in the ED.  He has begun to diurese, but remains short of breath at rest and will be admitted for ongoing evaluation and management.   Assessment & Plan:   Principal Problem:   Acute on chronic systolic CHF (congestive heart failure) (HCC) Active Problems:   Coronary atherosclerosis of native coronary artery   Essential hypertension   Thrombocytopenia (HCC)   Atrial  flutter (HCC)   Shortness of breath   Bilateral pleural effusion   1-Acute systolic Heart failure exacerbation;  Last EF 35-40%  Started on IV lasix.  Discontinue HCTZ.  On lisinopril.  Repeat ECHO. Lower EF 25--30 % Bradycardia, will hold carvedilol. Replete mg and k. Asymptomatic.  Cardiology consulted and following. .  Dry weight 160--Weight 168--- Negative 4 L. Urine out put 1.4 l  Weight 62 kg ---60  Cardiology recommend to continue with IV lasix.   CAD; on lisinopril.  Denies chest pain. Cycle cardiac enzymes.  ECHO lower Ef 25 % Continue with statins.   Paroxysmal A fib;  Continue with eliquis.  Hold carvedilol due to bradycardia.   HTN;contionue with lasix, lisinopril.    Thrombocytopenia;  B 12 low normal started  supplement.  Improving.   Hypokalemia; replete orally/.     DVT prophylaxis: Eliquis  Code Status: full code.  Family Communication: Son at bedside.  Disposition Plan:   Consultants:   Cardiology    Procedures:  ECHO EF 25 %   Antimicrobials: none   Subjective: Feels with more energy, breathing better.   Objective: Vitals:   06/05/18 1624 06/05/18 2007 06/06/18 0110 06/06/18 0500  BP: (!) 148/79 128/80  139/77  Pulse: (!) 59 72  69  Resp:  18  16  Temp:  97.9 F (36.6 C)  98 F (36.7 C)  TempSrc:  Oral  Oral  SpO2:  96%  97%  Weight:   60.7 kg   Height:        Intake/Output Summary (Last 24  hours) at 06/06/2018 1117 Last data filed at 06/06/2018 0926 Gross per 24 hour  Intake 960 ml  Output 1400 ml  Net -440 ml   Filed Weights   06/04/18 0039 06/05/18 0413 06/06/18 0110  Weight: 68.9 kg 62.8 kg 60.7 kg    Examination:  General exam: NAD Respiratory system: CTA Cardiovascular system; S 1, S 2 IRR Gastrointestinal system: BS present, soft,nt Central nervous system: Non focal.  Extremities: no edema Skin: no rashes   Data Reviewed: I have personally reviewed following labs and imaging  studies  CBC: Recent Labs  Lab 06/03/18 2112 06/04/18 0614 06/05/18 0449 06/06/18 0642  WBC 4.7 3.3* 4.3 4.9  HGB 12.9* 11.1* 12.4* 12.6*  HCT 40.1 33.9* 37.6* 38.3*  MCV 105.8* 102.7* 103.9* 103.8*  PLT 106* 97* 115* 546*   Basic Metabolic Panel: Recent Labs  Lab 06/03/18 2112 06/04/18 0614 06/04/18 1051 06/05/18 0449 06/06/18 0642  NA 137 138  --  140 140  K 3.9 3.1*  --  3.4* 3.8  CL 103 103  --  103 102  CO2 25 26  --  30 30  GLUCOSE 131* 105*  --  98 103*  BUN 14 9  --  10 10  CREATININE 0.94 0.88  --  0.97 0.92  CALCIUM 9.6 9.1  --  9.3 9.4  MG  --   --  1.8  --   --    GFR: Estimated Creatinine Clearance: 52.2 mL/min (by C-G formula based on SCr of 0.92 mg/dL). Liver Function Tests: No results for input(s): AST, ALT, ALKPHOS, BILITOT, PROT, ALBUMIN in the last 168 hours. No results for input(s): LIPASE, AMYLASE in the last 168 hours. No results for input(s): AMMONIA in the last 168 hours. Coagulation Profile: No results for input(s): INR, PROTIME in the last 168 hours. Cardiac Enzymes: Recent Labs  Lab 06/04/18 1051 06/04/18 1624 06/04/18 2201  TROPONINI <0.03 <0.03 <0.03   BNP (last 3 results) No results for input(s): PROBNP in the last 8760 hours. HbA1C: No results for input(s): HGBA1C in the last 72 hours. CBG: No results for input(s): GLUCAP in the last 168 hours. Lipid Profile: No results for input(s): CHOL, HDL, LDLCALC, TRIG, CHOLHDL, LDLDIRECT in the last 72 hours. Thyroid Function Tests: No results for input(s): TSH, T4TOTAL, FREET4, T3FREE, THYROIDAB in the last 72 hours. Anemia Panel: Recent Labs    06/04/18 0614  VITAMINB12 210  FOLATE 16.6   Sepsis Labs: No results for input(s): PROCALCITON, LATICACIDVEN in the last 168 hours.  No results found for this or any previous visit (from the past 240 hour(s)).       Radiology Studies: No results found.      Scheduled Meds: . apixaban  5 mg Oral BID  . atorvastatin  40 mg  Oral q1800  . furosemide  20 mg Intravenous Q12H  . lisinopril  20 mg Oral Daily  . nitrofurantoin (macrocrystal-monohydrate)  100 mg Oral Q12H  . omega-3 acid ethyl esters  1 g Oral BID  . potassium chloride SA  40 mEq Oral BID  . sodium chloride flush  3 mL Intravenous Q12H  . spironolactone  12.5 mg Oral Daily  . vitamin B-12  100 mcg Oral Daily   Continuous Infusions: . sodium chloride       LOS: 3 days    Time spent: 35 minutes.     Elmarie Shiley, MD [facility] Pager [phone]  If 7PM-7AM, please contact night-coverage [email or web address] Password [facility] 06/06/2018,  11:17 AM

## 2018-06-06 NOTE — Telephone Encounter (Signed)
Pts Daughter just wanted to keep Dr Irish Lack and [place] RN, that the pt is currently admitted for cardiac issues and fluid overload, and is improving.  Daughter states since the pt has been admitted, he has been diuresed 5 Liters. Daughter states he has significantly improved. Daughter states that they are anticipating the pt to be discharged by tomorrow or Wednesday at latest.  Daughter states that they already have a follow-up appt with Dr Irish Lack scheduled for this Friday at 1:40 pm, and would like to keep that.  Daughter states that if anything changes and pts discharge date overlaps this appt, she will call ahead and let us know.  Informed the pts daughter that Dr Irish Lack and his RN are out of the office today, but I will route this message to them for further review, upon return to the office. Daughter verbalized understanding and agrees with this plan.

## 2018-06-06 NOTE — Telephone Encounter (Signed)
Follow Up:    [name] wanted Parker Odonnell to know the pt was admitted to [facility] on 06-03-18 and he will keep his appt on this Friday,8-23- please.

## 2018-06-06 NOTE — Progress Notes (Signed)
Physical Therapy Treatment Patient Details Name: Parker Odonnell. MRN: [id number] DOB: [birth year] Today's Date: 06/06/2018    History of Present Illness Pt is an 84 y.o. male with medical history significant for paroxysmal atrial fibrillation on Eliquis, coronary artery disease, hypertension, chronic left bundle branch block, and chronic thrombocytopenia. He presented to the emergency department for evaluation of shortness of breath and bilateral leg swelling.    PT Comments    Pt very eager to ambulate with therapy today. Pt is making good progress towards his goals, however is mildly limited in safe mobility by decreased strength and endurance. Pt currently supervision for transfers and min guard for ambulation of 500 feet with SPC. Pt ambulates with slow steady gait. D/c plans remain appropriate to improve gait speed, strength and balance. PT will continue to follow acutely.     Follow Up Recommendations  Home health PT;Supervision - Intermittent     Equipment Recommendations  None recommended by PT    Recommendations for Other Services       Precautions / Restrictions Restrictions Weight Bearing Restrictions: No    Mobility  Bed Mobility               General bed mobility comments: seated EoB on entry  Transfers Overall transfer level: Needs assistance Equipment used: Straight cane Transfers: Sit to/from Stand Sit to Stand: Supervision         General transfer comment: increased time to stabilize initial standing balance  Ambulation/Gait Ambulation/Gait assistance: Min guard Gait Distance (Feet): 500 Feet Assistive device: Straight cane Gait Pattern/deviations: Step-through pattern;Decreased stride length Gait velocity: decreased Gait velocity interpretation: 1.31 - 2.62 ft/sec, indicative of limited community ambulator General Gait Details: steady gait/no LOB, min guard for safety, no SOB noted. Pt able to participate in short conversations during  ambulaiton.         Balance Overall balance assessment: Mild deficits observed, not formally tested                                          Cognition Arousal/Alertness: Awake/alert Behavior During Therapy: WFL for tasks assessed/performed Overall Cognitive Status: Within Functional Limits for tasks assessed                                           General Comments General comments (skin integrity, edema, etc.): SaO2 on RA >90%O2 throughout, VSS      Pertinent Vitals/Pain Pain Assessment: No/denies pain           PT Goals (current goals can now be found in the care plan section) Acute Rehab PT Goals PT Goal Formulation: With patient Time For Goal Achievement: 06/18/18 Potential to Achieve Goals: Good Progress towards PT goals: Progressing toward goals    Frequency    Min 3X/week      PT Plan Current plan remains appropriate       AM-PAC PT "6 Clicks" Daily Activity  Outcome Measure  Difficulty turning over in bed (including adjusting bedclothes, sheets and blankets)?: A Little Difficulty moving from lying on back to sitting on the side of the bed? : A Little Difficulty sitting down on and standing up from a chair with arms (e.g., wheelchair, bedside commode, etc,.)?: A Little Help needed moving to and from a bed to  chair (including a wheelchair)?: None Help needed walking in hospital room?: None Help needed climbing 3-5 steps with a railing? : A Little 6 Click Score: 20    End of Session Equipment Utilized During Treatment: Gait belt Activity Tolerance: Patient tolerated treatment well Patient left: Other (comment);with call bell/phone within reach(seated EoB) Nurse Communication: Mobility status PT Visit Diagnosis: Difficulty in walking, not elsewhere classified (R26.2)     Time: 5625-6389 PT Time Calculation (min) (ACUTE ONLY): 27 min  Charges:  $Gait Training: 23-37 mins                     Virgil Slinger B. Migdalia Dk PT, DPT Acute Rehabilitation  [phone] Pager [phone]     [place] 06/06/2018, 2:59 PM

## 2018-06-06 NOTE — Plan of Care (Signed)
  Problem: Education: Goal: Knowledge of General Education information will improve Description Including pain rating scale, medication(s)/side effects and non-pharmacologic comfort measures  Outcome: Progressing   Problem: Clinical Measurements: Goal: Ability to maintain clinical measurements within normal limits will improve Outcome: Progressing Goal: Diagnostic test results will improve Outcome: Progressing Goal: Respiratory complications will improve Outcome: Progressing   Problem: Activity: Goal: Risk for activity intolerance will decrease Outcome: Progressing   Problem: Nutrition: Goal: Adequate nutrition will be maintained Outcome: Progressing   Problem: Coping: Goal: Level of anxiety will decrease Outcome: Progressing   Problem: Elimination: Goal: Will not experience complications related to bowel motility Outcome: Progressing

## 2018-06-06 NOTE — Progress Notes (Signed)
Initial Nutrition Assessment  DOCUMENTATION CODES:   Not applicable  INTERVENTION:   -MVI with minerals daily  NUTRITION DIAGNOSIS:   Increased nutrient needs related to chronic illness as evidenced by estimated needs.  GOAL:   Patient will meet greater than or equal to 90% of their needs  MONITOR:   PO intake, Supplement acceptance, Weight trends, Skin, I & O's, Labs  REASON FOR ASSESSMENT:   Consult Diet education, Assessment of nutrition requirement/status  ASSESSMENT:   Parker Odonnell. is a 84 y.o. male with medical history significant for paroxysmal atrial fibrillation on Eliquis, coronary artery disease, hypertension, chronic left bundle branch block, and chronic thrombocytopenia, now presenting to the emergency department for evaluation of shortness of breath and bilateral leg swelling.    Pt admitted with CHF.   Spoke with pt and daughter at bedside. Per pt daughter, pt has been residing in an independent living facility over the past 3 months; pt is served 3 meals per day at facility. Per pt, he consumes a large breakfast and lunch and a small dinner. Daughter shares that facility tries to overall follow a low sodium diet given pt population. Typical meal intake is Breakfast: oatmeal, fruit, fried egg, orange juice, and a piece of toast; Lunch: meat, starch, two vegetables, and a dessert; Dinner: soup and sandwich.   Pt reports other residents reporting to him that he has lost weight. Per pt daughter, UBW is around 160#- he lost 3# from his last MD appointment. Reviewed wt hx; noted pt has experienced a 10.7% wt loss over the past 6 months. Per pt daughter, he was started on hydrochlorothiazide about 3 months ago and has been struggling with fluid rentention ("his legs were hard as rocks, but is a 180% improvement now"). Pt denies any changers in mobility- typically walks with a cane and is followed by PT at his facility.   Pt with generalized fat and muscle depletion  related to advanced age. Meal completion 75-100%. Pt denies any changes in intake or appetite. Discussed importance of continued good meal intake. Per pt daughter, he does not receive any supplements PTA. Pt is in charge of his medications by daughter keeps a pill box for him and has recently added an MVI. Pt amenable to continue MVI.   Labs reviewed.   NUTRITION - FOCUSED PHYSICAL EXAM:    Most Recent Value  Orbital Region  No depletion  Upper Arm Region  Mild depletion  Thoracic and Lumbar Region  No depletion  Buccal Region  No depletion  Temple Region  Mild depletion  Clavicle Bone Region  Mild depletion  Clavicle and Acromion Bone Region  No depletion  Scapular Bone Region  No depletion  Dorsal Hand  Mild depletion  Patellar Region  Mild depletion  Anterior Thigh Region  Mild depletion  Posterior Calf Region  Mild depletion  Edema (RD Assessment)  None  Hair  Reviewed  Eyes  Reviewed  Mouth  Reviewed  Skin  Reviewed  Nails  Reviewed       Diet Order:   Diet Order            Diet Heart Room service appropriate? Yes; Fluid consistency: Thin  Diet effective now              EDUCATION NEEDS:   Education needs have been addressed  Skin:  Skin Assessment: Skin Integrity Issues: Skin Integrity Issues:: Other (Comment) Other: MASD buttocks  Last BM:  06/05/18  Height:   Ht  Readings from Last 1 Encounters:  06/04/18 5\' 9"  (1.753 m)    Weight:   Wt Readings from Last 1 Encounters:  06/06/18 60.7 kg    Ideal Body Weight:  72.7 kg  BMI:  Body mass index is 19.77 kg/m.  Estimated Nutritional Needs:   Kcal:  1600-1800  Protein:  75-90 grams  Fluid:  1.6-1.8 L    Glendi Mohiuddin A. Jimmye Norman, RD, LDN, CDE Pager: [phone] After hours Pager: [phone]

## 2018-06-06 NOTE — Care Management Important Message (Signed)
Important Message  Patient Details  Name: Parker Odonnell. MRN: [id number] Date of Birth: 15-May-1932   Medicare Important Message Given:  Yes    Chesnie Capell P Lakela Kuba 06/06/2018, 1:50 PM

## 2018-06-06 NOTE — Progress Notes (Signed)
Progress Note  Patient Name: Parker Odonnell. Date of Encounter: 06/06/2018  Primary Cardiologist: Larae Grooms, MD   Subjective   Patient feeling much better today, reports more energy. Brisk urine output.  Inpatient Medications    Scheduled Meds: . apixaban  5 mg Oral BID  . atorvastatin  40 mg Oral q1800  . furosemide  20 mg Intravenous Q12H  . lisinopril  20 mg Oral Daily  . nitrofurantoin (macrocrystal-monohydrate)  100 mg Oral Q12H  . omega-3 acid ethyl esters  1 g Oral BID  . potassium chloride SA  40 mEq Oral BID  . sodium chloride flush  3 mL Intravenous Q12H  . spironolactone  12.5 mg Oral Daily  . vitamin B-12  100 mcg Oral Daily   Continuous Infusions: . sodium chloride     PRN Meds: sodium chloride, acetaminophen, ondansetron (ZOFRAN) IV, polyvinyl alcohol, sodium chloride flush   Vital Signs    Vitals:   06/05/18 1624 06/05/18 2007 06/06/18 0110 06/06/18 0500  BP: (!) 148/79 128/80  139/77  Pulse: (!) 59 72  69  Resp:  18  16  Temp:  97.9 F (36.6 C)  98 F (36.7 C)  TempSrc:  Oral  Oral  SpO2:  96%  97%  Weight:   60.7 kg   Height:        Intake/Output Summary (Last 24 hours) at 06/06/2018 1104 Last data filed at 06/06/2018 0926 Gross per 24 hour  Intake 960 ml  Output 1400 ml  Net -440 ml   Filed Weights   06/04/18 0039 06/05/18 0413 06/06/18 0110  Weight: 68.9 kg 62.8 kg 60.7 kg    Telemetry     atrial fibrillation/flutter, lowest rates in the 40s - Personally Reviewed  ECG    No new - Personally Reviewed  Physical Exam   GEN: No acute distress.  Resting comfortably in bed. Neck: supple, JVD to low neck Cardiac: irregularly irregular, bradycardic S1 and S2, no appreciable m/r/g Respiratory: much improved, minimal dullness at bases GI: Soft, nontender, non-distended. Bowel sounds normal MS: 1+ pitting edema to low calf, improvement from yesterday with visible wrinkling from diuresis; No deformity. Neuro:  Nonfocal,  moves all limbs independently Psych: Normal affect   Labs    Chemistry Recent Labs  Lab 06/04/18 0614 06/05/18 0449 06/06/18 0642  NA 138 140 140  K 3.1* 3.4* 3.8  CL 103 103 102  CO2 26 30 30   GLUCOSE 105* 98 103*  BUN 9 10 10   CREATININE 0.88 0.97 0.92  CALCIUM 9.1 9.3 9.4  GFRNONAA >60 >60 >60  GFRAA >60 >60 >60  ANIONGAP 9 7 8      Hematology Recent Labs  Lab 06/04/18 0614 06/05/18 0449 06/06/18 0642  WBC 3.3* 4.3 4.9  RBC 3.30* 3.62* 3.69*  HGB 11.1* 12.4* 12.6*  HCT 33.9* 37.6* 38.3*  MCV 102.7* 103.9* 103.8*  MCH 33.6 34.3* 34.1*  MCHC 32.7 33.0 32.9  RDW 13.3 13.4 13.3  PLT 97* 115* 114*    Cardiac Enzymes Recent Labs  Lab 06/04/18 1051 06/04/18 1624 06/04/18 2201  TROPONINI <0.03 <0.03 <0.03    Recent Labs  Lab 06/03/18 2132  TROPIPOC 0.03     BNP Recent Labs  Lab 06/03/18 2112  BNP 1,225.8*     DDimer No results for input(s): DDIMER in the last 168 hours.   Radiology    No results found.  Cardiac Studies   Echo personally read 06/04/18 Study Conclusions  - Left  ventricle: The cavity size was normal. Systolic function was severely reduced. The estimated ejection fraction was in the range of 25% to 30%. Diffuse hypokinesis with worse hypokinesis at the apex. - Ventricular septum: Septal motion showed abnormal function and dyssynergy. These changes are consistent with a left bundle branch block. - Aortic valve: There was trivial regurgitation. - Mitral valve: There was mild regurgitation. - Left atrium: The atrium was moderately to severely dilated. - Right ventricle: The cavity size was mildly to moderately dilated. Wall thickness was normal. Systolic function was mildly to moderately reduced. - Right atrium: The atrium was moderately to severely dilated. - Atrial septum: No defect or patent foramen ovale was identified. - Tricuspid valve: There was moderate regurgitation. - Pulmonary arteries: Systolic  pressure was severely increased. PA peak pressure: 78 mm Hg (S). - Pericardium, extracardiac: There was a left pleural effusion.  Impressions:  - Severely reduced LVEF with global hypokinesis, visually worse at the apex, and dyssynchrony. Biatrial enlargement. Severe pulmonary hypertension with PASP 78 mmHg. Left pleural effusion.  Patient Profile     84 y.o. male with PMH CAD s/p LAD stent in 9702, chronic systolic and diastolic heart failure with acute exacerbation, atrial fibrillation/atrial flutter on anticoagulation who is admitted for acute on chronic systolic and diastolic heart failure.  Assessment & Plan    1. Acute on chronic systolic and diastolic heart failure with severe pulmonary hypertension: class II-III symptoms currently -diuresing briskly, with improvements on physical exam today. Kidney function stable -Encouraged him to ambulate with assistance (family is very present) to try to mobilize fluid. -His right sided filling pressures were very high. Once he is euvolemic may be of benefit to check a limited study just to determine filling pressures to see if this is all fluid or if there is a residual etiology. -Beta blocker on hold given bradycardia. Continue lisinopril. We discussed entresto as an option for him. He also has a LBBB with QRS >150, so CRT may be an option. He wants to discuss everything with his family before making any changes.  2. Atrial fibrillation/flutter: no immediate plans for procedure, continue anticoagulation. Beta blocker had to be held due to bradycardia, given his bradycardia in fib/flutter unlikely he will tolerate. Once he is euvolemic, can consider cardioversion to see if restoring sinus rhythm leaves [place] room for beta blocker.   3. History of CAD: no active chest pain, no real change in his wall motion -continue atorvastatin 40 mg daily -no aspirin as he is on apixaban  4. Hypertension: suspect that this may be driven by fluid as  well. -has room to uptitrate lisinopril in the long term, but given fluid, hypokalemia, and history of CAD, started spironolactone this admission.  -if renal function remains stable, will increase lisinopril vs. Change to entresto as above. Wants to discuss with his family.  We will continue to follow.   Time Spent Directly with Patient: I have spent a total of 35 minutes with the patient reviewing hospital notes, telemetry, EKGs, labs and examining the patient as well as establishing an assessment and plan that was discussed personally with the patient.  > 50% of time was spent in direct patient care.  Length of Stay:  LOS: 3 days   Buford Dresser, MD, PhD [facility]  [facility]   06/06/2018, 11:04 AM      For questions or updates, please contact [place] Please consult [email or web address] for contact info under Cardiology/STEMI.

## 2018-06-07 LAB — BASIC METABOLIC PANEL
ANION GAP: 6 (ref 5–15)
BUN: 13 mg/dL (ref 8–23)
CALCIUM: 9.3 mg/dL (ref 8.9–10.3)
CO2: 27 mmol/L (ref 22–32)
Chloride: 103 mmol/L (ref 98–111)
Creatinine, Ser: 1.04 mg/dL (ref 0.61–1.24)
GFR calc Af Amer: >60 mL/min (ref >60)
GFR calc non Af Amer: >60 mL/min (ref >60)
GLUCOSE: 114 mg/dL — AB (ref 70–99)
Potassium: 4.1 mmol/L (ref 3.5–5.1)
Sodium: 136 mmol/L (ref 135–145)

## 2018-06-07 MED ORDER — FUROSEMIDE 20 MG PO TABS
20.0000 mg | ORAL_TABLET | Freq: Every day | ORAL | Status: DC
  Administered 2018-06-08: 20 mg via ORAL
  Filled 2018-06-07: qty 1

## 2018-06-07 MED ORDER — FUROSEMIDE 10 MG/ML IJ SOLN
20.0000 mg | Freq: Two times a day (BID) | INTRAMUSCULAR | Status: AC
  Administered 2018-06-07: 20 mg via INTRAVENOUS
  Filled 2018-06-07: qty 2

## 2018-06-07 MED ORDER — POTASSIUM CHLORIDE CRYS ER 20 MEQ PO TBCR
20.0000 meq | EXTENDED_RELEASE_TABLET | Freq: Every day | ORAL | Status: DC
  Administered 2018-06-07: 20 meq via ORAL
  Filled 2018-06-07: qty 1

## 2018-06-07 NOTE — Plan of Care (Signed)
  Problem: Clinical Measurements: Goal: Diagnostic test results will improve Outcome: Progressing   Problem: Clinical Measurements: Goal: Respiratory complications will improve Outcome: Progressing   Problem: Clinical Measurements: Goal: Cardiovascular complication will be avoided Outcome: Progressing   

## 2018-06-07 NOTE — Progress Notes (Signed)
PROGRESS NOTE    Parker Odonnell.  FAO:[id number] DOB: [birth year] DOA: 06/03/2018 PCP: Lawerance Cruel, MD    Brief Narrative: Parker Petillo. is a 84 y.o. male with medical history significant for paroxysmal atrial fibrillation on Eliquis, coronary artery disease, hypertension, chronic left bundle branch block, and chronic thrombocytopenia, now presenting to the emergency department for evaluation of shortness of breath and bilateral leg swelling.  Patient is accompanied by his daughter who assist with the history.  He has been noted to have worsening bilateral lower extremity edema for at least 2 weeks, but had not been particularly short of breath until several days ago.  For the past several days, he has had shortness of breath, initially with exertion, but now at rest also.  There has been a mild cough, occasionally productive of thick yellow sputum.  No chest pain.  He also denies fevers or chills.  He had an echocardiogram in March with EF 35 to 40%.    ED Course: Upon arrival to the ED, patient is found to be afebrile, saturating adequately on room air while at rest, tachypneic, and hypertensive.  EKG features atrial flutter with chronic left bundle branch block.  Chest x-ray is notable for cardiomegaly, small bilateral pleural effusions, and bibasilar airspace disease likely reflecting atelectasis or possibly pneumonia.  Chemistry panel is unremarkable and CBC is notable for a worsened thrombocytopenia with platelets 106,000.  Troponin is normal and BNP is elevated at 1226.  Patient was treated with 20 mg IV Lasix in the ED.  He has begun to diurese, but remains short of breath at rest and will be admitted for ongoing evaluation and management.   Assessment & Plan:   Principal Problem:   Acute on chronic systolic CHF (congestive heart failure) (HCC) Active Problems:   Coronary atherosclerosis of native coronary artery   Essential hypertension   Thrombocytopenia (HCC)   Atrial  flutter (HCC)   Shortness of breath   Bilateral pleural effusion   1-Acute systolic Heart failure exacerbation;  Last EF 35-40%  Started on IV lasix. Plan to transition to oral lasix tomorrow.  Discontinue HCTZ. On lisinopril. Started on spironolactone.  Repeat ECHO. Lower EF 25--30 % Bradycardia,  Holding  carvedilol.  Cardiology consulted and following. .  Dry weight 160--Weight 168--- Negative 5 L. Urine out put 1.7L Weight 62 kg ---60  Last weight at Dr Scarlette Calico office was 150 pounds. Weight today 127  CAD; on lisinopril.  Denies chest pain. Cycle cardiac enzymes.  ECHO lower Ef 25 % Continue with statins.   Paroxysmal A fib;  Continue with eliquis.  Hold carvedilol due to bradycardia.   HTN;contionue with lasix, lisinopril.    Thrombocytopenia;  B 12 low normal started  supplement.  Improving.   Hypokalemia; resolved.  Change kcl supplements to once daily     DVT prophylaxis: Eliquis  Code Status: full code.  Family Communication: Daughter at bedside 8-19. She would agree with any recommendations from cardiology for medications management.  Disposition Plan:   Consultants:   Cardiology    Procedures:  ECHO EF 25 %   Antimicrobials: none   Subjective: Feels better, dyspnea improved.   Objective: Vitals:   06/06/18 2111 06/07/18 0432 06/07/18 1014 06/07/18 1143  BP: (!) 147/90 140/85 130/63 134/85  Pulse: 70 72 70 71  Resp: 17 18 18 20   Temp: 97.9 F (36.6 C) 98 F (36.7 C)  97.9 F (36.6 C)  TempSrc: Oral   Oral  SpO2: 99% 98%  99%  Weight:  58.6 kg    Height:        Intake/Output Summary (Last 24 hours) at 06/07/2018 1459 Last data filed at 06/07/2018 1018 Gross per 24 hour  Intake 600 ml  Output 1400 ml  Net -800 ml   Filed Weights   06/05/18 0413 06/06/18 0110 06/07/18 0432  Weight: 62.8 kg 60.7 kg 58.6 kg    Examination:  General exam: NAD Respiratory system: CTA Cardiovascular system; S 1, S 2  Gastrointestinal  system: BS present, soft, nt Central nervous system: non focal.  Extremities: trace edema Skin: no rashes   Data Reviewed: I have personally reviewed following labs and imaging studies  CBC: Recent Labs  Lab 06/03/18 2112 06/04/18 0614 06/05/18 0449 06/06/18 0642  WBC 4.7 3.3* 4.3 4.9  HGB 12.9* 11.1* 12.4* 12.6*  HCT 40.1 33.9* 37.6* 38.3*  MCV 105.8* 102.7* 103.9* 103.8*  PLT 106* 97* 115* 160*   Basic Metabolic Panel: Recent Labs  Lab 06/03/18 2112 06/04/18 0614 06/04/18 1051 06/05/18 0449 06/06/18 0642 06/07/18 0419  NA 137 138  --  140 140 136  K 3.9 3.1*  --  3.4* 3.8 4.1  CL 103 103  --  103 102 103  CO2 25 26  --  30 30 27   GLUCOSE 131* 105*  --  98 103* 114*  BUN 14 9  --  10 10 13   CREATININE 0.94 0.88  --  0.97 0.92 1.04  CALCIUM 9.6 9.1  --  9.3 9.4 9.3  MG  --   --  1.8  --   --   --    GFR: Estimated Creatinine Clearance: 44.6 mL/min (by C-G formula based on SCr of 1.04 mg/dL). Liver Function Tests: No results for input(s): AST, ALT, ALKPHOS, BILITOT, PROT, ALBUMIN in the last 168 hours. No results for input(s): LIPASE, AMYLASE in the last 168 hours. No results for input(s): AMMONIA in the last 168 hours. Coagulation Profile: No results for input(s): INR, PROTIME in the last 168 hours. Cardiac Enzymes: Recent Labs  Lab 06/04/18 1051 06/04/18 1624 06/04/18 2201  TROPONINI <0.03 <0.03 <0.03   BNP (last 3 results) No results for input(s): PROBNP in the last 8760 hours. HbA1C: No results for input(s): HGBA1C in the last 72 hours. CBG: No results for input(s): GLUCAP in the last 168 hours. Lipid Profile: No results for input(s): CHOL, HDL, LDLCALC, TRIG, CHOLHDL, LDLDIRECT in the last 72 hours. Thyroid Function Tests: No results for input(s): TSH, T4TOTAL, FREET4, T3FREE, THYROIDAB in the last 72 hours. Anemia Panel: No results for input(s): VITAMINB12, FOLATE, FERRITIN, TIBC, IRON, RETICCTPCT in the last 72 hours. Sepsis Labs: No results  for input(s): PROCALCITON, LATICACIDVEN in the last 168 hours.  No results found for this or any previous visit (from the past 240 hour(s)).       Radiology Studies: No results found.      Scheduled Meds: . apixaban  5 mg Oral BID  . atorvastatin  40 mg Oral q1800  . furosemide  20 mg Intravenous Q12H  . lisinopril  20 mg Oral Daily  . multivitamin with minerals  1 tablet Oral Daily  . nitrofurantoin (macrocrystal-monohydrate)  100 mg Oral Q12H  . omega-3 acid ethyl esters  1 g Oral BID  . potassium chloride SA  20 mEq Oral Daily  . sodium chloride flush  3 mL Intravenous Q12H  . spironolactone  12.5 mg Oral Daily  . vitamin B-12  100 mcg Oral Daily   Continuous Infusions: . sodium chloride       LOS: 4 days    Time spent: 35 minutes.     Elmarie Shiley, MD [facility] Pager [phone]  If 7PM-7AM, please contact night-coverage [email or web address] Password [facility] 06/07/2018, 2:59 PM

## 2018-06-07 NOTE — Plan of Care (Signed)
  Problem: Clinical Measurements: Goal: Ability to maintain clinical measurements within normal limits will improve Outcome: Progressing   Problem: Clinical Measurements: Goal: Diagnostic test results will improve Outcome: Progressing   Problem: Clinical Measurements: Goal: Cardiovascular complication will be avoided Outcome: Progressing   

## 2018-06-07 NOTE — Care Management Note (Signed)
Case Management Note  Patient Details  Name: Parker Odonnell. MRN: [id number] Date of Birth: [birth year]  Subjective/Objective:    CHF               Action/Plan: Patient lives at [place]; PCP: Lawerance Cruel, MD; has [facility] for medical insurance with prescription drug coverage; DME - cane; HHC choice offered, pt son chose [facility]; Tiffany with [facility] called for arrangements. CM will continue to follow for progression of care.  Expected Discharge Date:   possibly 06/08/2018               Expected Discharge Plan:  [place]  Discharge planning Services  CM Consult     Choice offered to:  Patient, Adult Children  HH Arranged:  RN, PT, OT [place] Agency:  [facility] (formerly [facility]  Status of Service:  In process, will continue to follow  Sherrilyn Rist [phone] 06/07/2018, 1:27 PM

## 2018-06-07 NOTE — Progress Notes (Signed)
Progress Note  Patient Name: Parker Odonnell. Date of Encounter: 06/07/2018  Primary Cardiologist: Larae Grooms, MD   Subjective   Patient reports continued improvement. States breathing and LE edema have improved significantly. He denies chest pain. He reports back pain which started a couple days ago which he attributes to being in bed more frequently than at home.   Inpatient Medications    Scheduled Meds: . apixaban  5 mg Oral BID  . atorvastatin  40 mg Oral q1800  . furosemide  20 mg Intravenous Q12H  . lisinopril  20 mg Oral Daily  . multivitamin with minerals  1 tablet Oral Daily  . nitrofurantoin (macrocrystal-monohydrate)  100 mg Oral Q12H  . omega-3 acid ethyl esters  1 g Oral BID  . potassium chloride SA  20 mEq Oral Daily  . sodium chloride flush  3 mL Intravenous Q12H  . spironolactone  12.5 mg Oral Daily  . vitamin B-12  100 mcg Oral Daily   Continuous Infusions: . sodium chloride     PRN Meds: sodium chloride, acetaminophen, ondansetron (ZOFRAN) IV, polyvinyl alcohol, sodium chloride flush   Vital Signs    Vitals:   06/06/18 2111 06/07/18 0432 06/07/18 1014 06/07/18 1143  BP: (!) 147/90 140/85 130/63 134/85  Pulse: 70 72 70 71  Resp: 17 18 18 20   Temp: 97.9 F (36.6 C) 98 F (36.7 C)  97.9 F (36.6 C)  TempSrc: Oral   Oral  SpO2: 99% 98%  99%  Weight:  58.6 kg    Height:        Intake/Output Summary (Last 24 hours) at 06/07/2018 1442 Last data filed at 06/07/2018 1018 Gross per 24 hour  Intake 600 ml  Output 1400 ml  Net -800 ml   Filed Weights   06/05/18 0413 06/06/18 0110 06/07/18 0432  Weight: 62.8 kg 60.7 kg 58.6 kg    Telemetry    Atrial fibrillation with controlled ventricular rate - Personally Reviewed  Physical Exam   GEN: Sitting upright in bed in no acute distress.   Neck: No JVD, no carotid bruits Cardiac: Irregular rhythm, regular rate, no murmurs, rubs, or gallops.  Respiratory: Clear to auscultation bilaterally,  no wheezes/ rales/ rhonchi GI: NABS, Soft, nontender, non-distended  MS: No edema; No deformity. Neuro:  Nonfocal, moving all extremities spontaneously Psych: Normal affect   Labs    Chemistry Recent Labs  Lab 06/05/18 0449 06/06/18 0642 06/07/18 0419  NA 140 140 136  K 3.4* 3.8 4.1  CL 103 102 103  CO2 30 30 27   GLUCOSE 98 103* 114*  BUN 10 10 13   CREATININE 0.97 0.92 1.04  CALCIUM 9.3 9.4 9.3  GFRNONAA >60 >60 >60  GFRAA >60 >60 >60  ANIONGAP 7 8 6      Hematology Recent Labs  Lab 06/04/18 0614 06/05/18 0449 06/06/18 0642  WBC 3.3* 4.3 4.9  RBC 3.30* 3.62* 3.69*  HGB 11.1* 12.4* 12.6*  HCT 33.9* 37.6* 38.3*  MCV 102.7* 103.9* 103.8*  MCH 33.6 34.3* 34.1*  MCHC 32.7 33.0 32.9  RDW 13.3 13.4 13.3  PLT 97* 115* 114*    Cardiac Enzymes Recent Labs  Lab 06/04/18 1051 06/04/18 1624 06/04/18 2201  TROPONINI <0.03 <0.03 <0.03    Recent Labs  Lab 06/03/18 2132  TROPIPOC 0.03     BNP Recent Labs  Lab 06/03/18 2112  BNP 1,225.8*     DDimer No results for input(s): DDIMER in the last 168 hours.   Radiology  No results found.  Cardiac Studies   Echocardiogram 06/04/18: Study Conclusions  - Left ventricle: The cavity size was normal. Systolic function was   severely reduced. The estimated ejection fraction was in the   range of 25% to 30%. Diffuse hypokinesis with worse hypokinesis   at the apex. - Ventricular septum: Septal motion showed abnormal function and   dyssynergy. These changes are consistent with a left bundle   branch block. - Aortic valve: There was trivial regurgitation. - Mitral valve: There was mild regurgitation. - Left atrium: The atrium was moderately to severely dilated. - Right ventricle: The cavity size was mildly to moderately   dilated. Wall thickness was normal. Systolic function was mildly   to moderately reduced. - Right atrium: The atrium was moderately to severely dilated. - Atrial septum: No defect or patent  foramen ovale was identified. - Tricuspid valve: There was moderate regurgitation. - Pulmonary arteries: Systolic pressure was severely increased. PA   peak pressure: 78 mm Hg (S). - Pericardium, extracardiac: There was a left pleural effusion.  Impressions:  - Severely reduced LVEF with global hypokinesis, visually worse at   the apex, and dyssynchrony. Biatrial enlargement. Severe   pulmonary hypertension with PASP 78 mmHg. Left pleural effusion.  Patient Profile     84 y.o. male with PMH CAD s/p LAD stent in 2800, chronic systolic and diastolic heart failure with acute exacerbation, atrial fibrillation/atrial flutter on anticoagulation who is admitted for acute on chronic systolic and diastolic heart failure.  Assessment & Plan    1. Acute on chronic systolic and diastolic CHF: patient has continues to diurese well with net -743mL in the past 24 hours with -6.1L this admission. Weight is down 10kg this admission from 68.9>58.6kg today. He reports improvement in his breathing and LE edema. With clear lungs today and trace LE edema.  - Will given one additional dose of IV lasix this evening and transition to po 20mg  daily tomorrow.  - Continue lisinopril and spironolactone  - Consider transition to entresto  2. Atrial fibrillation/flutter: on apixaban. BBlocker held due to bradycardia.  - Could consider cardioversion outpatient  - Continue apixaban  3. History of CAD: no anginal complaints. ASA discontinued given need for anticoagulation - Continue statin  4. HTN: BP overall stable - Continue lisinopril and spironolactone for now. Could consider transition to [facility] outpatient vs uptitration of lisinopril outpatient.    For questions or updates, please contact [place] Please consult [email or web address] for contact info under Cardiology/STEMI.      Signed, Abigail Butts, PA-C  06/07/2018, 2:42 PM   [phone]

## 2018-06-08 LAB — CBC
HEMATOCRIT: 35.7 % — AB (ref 39.0–52.0)
HEMOGLOBIN: 11.8 g/dL — AB (ref 13.0–17.0)
MCH: 33.8 pg (ref 26.0–34.0)
MCHC: 33.1 g/dL (ref 30.0–36.0)
MCV: 102.3 fL — AB (ref 78.0–100.0)
Platelets: 105 10*3/uL — ABNORMAL LOW (ref 150–400)
RBC: 3.49 MIL/uL — AB (ref 4.22–5.81)
RDW: 13.2 % (ref 11.5–15.5)
WBC: 5.1 10*3/uL (ref 4.0–10.5)

## 2018-06-08 LAB — BASIC METABOLIC PANEL
ANION GAP: 7 (ref 5–15)
BUN: 14 mg/dL (ref 8–23)
CALCIUM: 9 mg/dL (ref 8.9–10.3)
CHLORIDE: 102 mmol/L (ref 98–111)
CO2: 25 mmol/L (ref 22–32)
Creatinine, Ser: 0.91 mg/dL (ref 0.61–1.24)
GFR calc non Af Amer: >60 mL/min (ref >60)
Glucose, Bld: 94 mg/dL (ref 70–99)
POTASSIUM: 3.9 mmol/L (ref 3.5–5.1)
Sodium: 134 mmol/L — ABNORMAL LOW (ref 135–145)

## 2018-06-08 LAB — GLUCOSE, CAPILLARY: GLUCOSE-CAPILLARY: 107 mg/dL — AB (ref 70–99)

## 2018-06-08 MED ORDER — LISINOPRIL 20 MG PO TABS: 20.0000 mg | ORAL_TABLET | Freq: Every day | ORAL | 0 refills | Status: DC

## 2018-06-08 MED ORDER — APIXABAN 2.5 MG PO TABS: 2.5000 mg | ORAL_TABLET | Freq: Two times a day (BID) | ORAL | 0 refills | Status: DC

## 2018-06-08 MED ORDER — FUROSEMIDE 20 MG PO TABS: 20.0000 mg | ORAL_TABLET | Freq: Every day | ORAL | 0 refills | Status: DC

## 2018-06-08 MED ORDER — APIXABAN 2.5 MG PO TABS
2.5000 mg | ORAL_TABLET | Freq: Two times a day (BID) | ORAL | Status: DC
  Administered 2018-06-08: 2.5 mg via ORAL
  Filled 2018-06-08: qty 1

## 2018-06-08 MED ORDER — SPIRONOLACTONE 25 MG PO TABS: 12.5000 mg | ORAL_TABLET | Freq: Every day | ORAL | 0 refills | Status: DC

## 2018-06-08 MED ORDER — CYANOCOBALAMIN 100 MCG PO TABS: 100.0000 ug | ORAL_TABLET | Freq: Every day | ORAL | 0 refills | Status: DC

## 2018-06-08 NOTE — Progress Notes (Signed)
NURSING PROGRESS NOTE  Parker Odonnell [id number] Discharge Data: 06/08/2018 1:30 PM Attending Provider: Caren Griffins, MD LOV:FIEP, Dwyane Luo, MD     [place]. to be D/C'd [place] per MD order.  Discussed with the patient the After Visit Summary and all questions fully answered. All IV's discontinued with no bleeding noted. All belongings returned to patient for patient to take home. Patient sent with printed prescriptions for Spironolactone, Lisinopril, Lasix, Eliquis and Vitamin B-12.   Last Vital Signs:  Blood pressure 127/86, pulse 74, temperature 97.8 F (36.6 C), temperature source Oral, resp. rate 18, height 5\' 9"  (1.753 m), weight 57.2 kg, SpO2 99 %.  Discharge Medication List Allergies as of 06/08/2018   No Known Allergies     Medication List    STOP taking these medications   hydrochlorothiazide 12.5 MG tablet Commonly known as:  HYDRODIURIL   lisinopril-hydrochlorothiazide 20-12.5 MG tablet Commonly known as:  PRINZIDE,ZESTORETIC     TAKE these medications   acetaminophen 500 MG tablet Commonly known as:  TYLENOL Take 500 mg by mouth every 6 (six) hours as needed for mild pain, moderate pain or headache.   apixaban 2.5 MG Tabs tablet Commonly known as:  ELIQUIS Take 1 tablet (2.5 mg total) by mouth 2 (two) times daily. What changed:    medication strength  how much to take   atorvastatin 40 MG tablet Commonly known as:  LIPITOR TAKE 1 TABLET (40 MG TOTAL) BY MOUTH DAILY.   carvedilol 3.125 MG tablet Commonly known as:  COREG Take 1 tablet (3.125 mg total) by mouth 2 (two) times daily.   OCUVITE-LUTEIN PO Take 1 capsule by mouth daily.   [employer] SPECTRAVITE PO Take by mouth every other day.   cyanocobalamin 100 MCG tablet Take 1 tablet (100 mcg total) by mouth daily. Start taking on:  06/09/2018   Fish Oil 1200 MG Caps Take 2 capsules by mouth 2 (two) times daily at 8 am and 10 pm.   furosemide 20 MG  tablet Commonly known as:  LASIX Take 1 tablet (20 mg total) by mouth daily. Start taking on:  06/09/2018   lisinopril 20 MG tablet Commonly known as:  PRINIVIL,ZESTRIL Take 1 tablet (20 mg total) by mouth daily. Start taking on:  06/09/2018   nitrofurantoin (macrocrystal-monohydrate) 100 MG capsule Commonly known as:  MACROBID Take 100 mg by mouth 2 (two) times daily.   potassium chloride SA 20 MEQ tablet Commonly known as:  K-DUR,KLOR-CON Take 1 tablet (20 mEq total) by mouth daily.   spironolactone 25 MG tablet Commonly known as:  ALDACTONE Take 0.5 tablets (12.5 mg total) by mouth daily. Start taking on:  06/09/2018   SYSTANE 0.4-0.3 % Soln Generic drug:  Polyethyl Glycol-Propyl Glycol Apply 1 drop to eye 2 (two) times daily as needed (dry eyes).

## 2018-06-08 NOTE — Discharge Summary (Signed)
Physician Discharge Summary  [name]. WER:[id number] DOB: Oct 11, 1932 DOA: 06/03/2018  PCP: Lawerance Cruel, MD  Admit date: 06/03/2018 Discharge date: 06/08/2018  Admitted From: home Disposition:  home  Recommendations for Outpatient Follow-up:  1. Follow up with PCP in 1-2 weeks 2. Follow up with Dr. Irish Lack in 2 days   Home Health: PT Equipment/Devices: none  Discharge Condition: stable CODE STATUS: Full code Diet recommendation: low salt   HPI: Per Dr. Myna Hidalgo, Parker Odonnell. is a 84 y.o. male with medical history significant for paroxysmal atrial fibrillation on Eliquis, coronary artery disease, hypertension, chronic left bundle branch block, and chronic thrombocytopenia, now presenting to the emergency department for evaluation of shortness of breath and bilateral leg swelling.  Patient is accompanied by his daughter who assist with the history.  He has been noted to have worsening bilateral lower extremity edema for at least 2 weeks, but had not been particularly short of breath until several days ago.  For the past several days, he has had shortness of breath, initially with exertion, but now at rest also.  There has been a mild cough, occasionally productive of thick yellow sputum.  No chest pain.  He also denies fevers or chills.  He had an echocardiogram in March with EF 35 to 40%. ED Course: Upon arrival to the ED, patient is found to be afebrile, saturating adequately on room air while at rest, tachypneic, and hypertensive.  EKG features atrial flutter with chronic left bundle branch block.  Chest x-ray is notable for cardiomegaly, small bilateral pleural effusions, and bibasilar airspace disease likely reflecting atelectasis or possibly pneumonia.  Chemistry panel is unremarkable and CBC is notable for a worsened thrombocytopenia with platelets 106,000.  Troponin is normal and BNP is elevated at 1226.  Patient was treated with 20 mg IV Lasix in the ED.  He has begun  to diurese, but remains short of breath at rest and will be admitted for ongoing evaluation and management.  Hospital Course: Acute systolic Heart failure exacerbation Last EF 35-40%, cardiology consulted and followed patient. He was initially maintained on IV Lasix, fluid status improved and was converted to po. He was also started on Spironolactone, and his HCTZ was discontinued. Repeat ECHO. Lower EF 25--30 %. He was net negative 7 L, dyspnea has resolved and patient returned to baseline. He has an appointment with cardiology in 2 days.  CAD, on lisinopril. -Stable, no chest pain or anginal symptoms Paroxysmal A fib; Continue with eliquis. Dose decreased to 2.5 based on age HTN;contionue with lasix, lisinopril.  Thrombocytopenia; B 12 low normal started  supplement. Improving.  Hypokalemia; resolved.   Discharge Diagnoses:  Principal Problem:   Acute on chronic systolic CHF (congestive heart failure) (HCC) Active Problems:   Coronary atherosclerosis of native coronary artery   Essential hypertension   Thrombocytopenia (HCC)   Atrial flutter (HCC)   Shortness of breath   Bilateral pleural effusion     Discharge Instructions   Allergies as of 06/08/2018   No Known Allergies     Medication List    STOP taking these medications   hydrochlorothiazide 12.5 MG tablet Commonly known as:  HYDRODIURIL   lisinopril-hydrochlorothiazide 20-12.5 MG tablet Commonly known as:  PRINZIDE,ZESTORETIC     TAKE these medications   acetaminophen 500 MG tablet Commonly known as:  TYLENOL Take 500 mg by mouth every 6 (six) hours as needed for mild pain, moderate pain or headache.   apixaban 2.5 MG Tabs  tablet Commonly known as:  ELIQUIS Take 1 tablet (2.5 mg total) by mouth 2 (two) times daily. What changed:    medication strength  how much to take   atorvastatin 40 MG tablet Commonly known as:  LIPITOR TAKE 1 TABLET (40 MG TOTAL) BY MOUTH DAILY.   carvedilol 3.125 MG  tablet Commonly known as:  COREG Take 1 tablet (3.125 mg total) by mouth 2 (two) times daily.   OCUVITE-LUTEIN PO Take 1 capsule by mouth daily.   [employer] SPECTRAVITE PO Take by mouth every other day.   cyanocobalamin 100 MCG tablet Take 1 tablet (100 mcg total) by mouth daily. Start taking on:  06/09/2018   Fish Oil 1200 MG Caps Take 2 capsules by mouth 2 (two) times daily at 8 am and 10 pm.   furosemide 20 MG tablet Commonly known as:  LASIX Take 1 tablet (20 mg total) by mouth daily. Start taking on:  06/09/2018   lisinopril 20 MG tablet Commonly known as:  PRINIVIL,ZESTRIL Take 1 tablet (20 mg total) by mouth daily. Start taking on:  06/09/2018   nitrofurantoin (macrocrystal-monohydrate) 100 MG capsule Commonly known as:  MACROBID Take 100 mg by mouth 2 (two) times daily.   potassium chloride SA 20 MEQ tablet Commonly known as:  K-DUR,KLOR-CON Take 1 tablet (20 mEq total) by mouth daily.   spironolactone 25 MG tablet Commonly known as:  ALDACTONE Take 0.5 tablets (12.5 mg total) by mouth daily. Start taking on:  06/09/2018   SYSTANE 0.4-0.3 % Soln Generic drug:  Polyethyl Glycol-Propyl Glycol Apply 1 drop to eye 2 (two) times daily as needed (dry eyes).      Follow-up Information    [facility] Follow up.   Specialty:  [place] Why:  They will do your home health care at your home Contact information: [place] [address] [phone]        Jettie Booze, MD Follow up on 06/10/2018.   Specialties:  Cardiology, Radiology, Interventional Cardiology Why:  Please arrive 15 minutes early for your 1:40pm post hospital cardiology follow-up appointment Contact information: [address] [place] [address] [phone]        Lawerance Cruel, MD. Go on 06/14/2018.   Specialty:  Family Medicine Why:  @2 :15pm Contact information: [place] [address] [phone]            Consultations:  Cardiology   Procedures/Studies:  2D echo  Impressions: - Severely reduced LVEF with global hypokinesis, visually worse at the apex, and dyssynchrony. Biatrial enlargement. Severe pulmonary hypertension with PASP 78 mmHg. Left pleural effusion.  Dg Chest 2 View  Result Date: 06/03/2018 CLINICAL DATA:  Short of breath EXAM: CHEST - 2 VIEW COMPARISON:  06/03/2018, 06/13/2012 FINDINGS: Left greater than right pleural effusions, slightly increased on the right. Stable cardiomegaly. Bibasilar airspace disease. No pneumothorax. IMPRESSION: Small left greater than right bilateral pleural effusions, slightly increased on the right side compared with prior radiograph. Cardiomegaly. Bibasilar airspace disease may reflect atelectasis or pneumonia. Electronically Signed   By: Donavan Foil M.D.   On: 06/03/2018 21:23     Subjective: - no chest pain, shortness of breath, no abdominal pain, nausea or vomiting.   Discharge Exam: Vitals:   06/08/18 0914 06/08/18 1144  BP: 124/87 127/86  Pulse: 75 74  Resp: 18   Temp: 98.1 F (36.7 C) 97.8 F (36.6 C)  SpO2: 95% 99%  General: Pt is alert, awake, not in acute distress Cardiovascular: RRR, S1/S2 +, no rubs, no gallops Respiratory: CTA bilaterally, no wheezing, no rhonchi Abdominal: Soft, NT, ND, bowel sounds + Extremities: no edema, no cyanosis    The results of significant diagnostics from this hospitalization (including imaging, microbiology, ancillary and laboratory) are listed below for reference.     Microbiology: No results found for this or any previous visit (from the past 240 hour(s)).   Labs: BNP (last 3 results) Recent Labs    06/03/18 2112  BNP 0,160.1*   Basic Metabolic Panel: Recent Labs  Lab 06/04/18 0614 06/04/18 1051 06/05/18 0449 06/06/18 0642 06/07/18 0419 06/08/18 0416  NA 138  --  140 140 136 134*  K 3.1*  --  3.4* 3.8 4.1 3.9  CL 103  --  103 102 103 102  CO2 26  --  30  30 27 25   GLUCOSE 105*  --  98 103* 114* 94  BUN 9  --  10 10 13 14   CREATININE 0.88  --  0.97 0.92 1.04 0.91  CALCIUM 9.1  --  9.3 9.4 9.3 9.0  MG  --  1.8  --   --   --   --    Liver Function Tests: No results for input(s): AST, ALT, ALKPHOS, BILITOT, PROT, ALBUMIN in the last 168 hours. No results for input(s): LIPASE, AMYLASE in the last 168 hours. No results for input(s): AMMONIA in the last 168 hours. CBC: Recent Labs  Lab 06/03/18 2112 06/04/18 0614 06/05/18 0449 06/06/18 0642 06/08/18 0416  WBC 4.7 3.3* 4.3 4.9 5.1  HGB 12.9* 11.1* 12.4* 12.6* 11.8*  HCT 40.1 33.9* 37.6* 38.3* 35.7*  MCV 105.8* 102.7* 103.9* 103.8* 102.3*  PLT 106* 97* 115* 114* 105*   Cardiac Enzymes: Recent Labs  Lab 06/04/18 1051 06/04/18 1624 06/04/18 2201  TROPONINI <0.03 <0.03 <0.03   BNP: Invalid input(s): POCBNP CBG: Recent Labs  Lab 06/08/18 1135  GLUCAP 107*   D-Dimer No results for input(s): DDIMER in the last 72 hours. Hgb A1c No results for input(s): HGBA1C in the last 72 hours. Lipid Profile No results for input(s): CHOL, HDL, LDLCALC, TRIG, CHOLHDL, LDLDIRECT in the last 72 hours. Thyroid function studies No results for input(s): TSH, T4TOTAL, T3FREE, THYROIDAB in the last 72 hours.  Invalid input(s): FREET3 Anemia work up No results for input(s): VITAMINB12, FOLATE, FERRITIN, TIBC, IRON, RETICCTPCT in the last 72 hours. Urinalysis    Component Value Date/Time   COLORURINE YELLOW 06/13/2012 1027   APPEARANCEUR CLEAR 06/13/2012 1027   LABSPEC 1.008 06/13/2012 1027   PHURINE 7.0 06/13/2012 1027   GLUCOSEU NEGATIVE 06/13/2012 1027   HGBUR NEGATIVE 06/13/2012 1027   BILIRUBINUR NEGATIVE 06/13/2012 1027   [place] 06/13/2012 1027   PROTEINUR NEGATIVE 06/13/2012 1027   UROBILINOGEN 0.2 06/13/2012 1027   NITRITE NEGATIVE 06/13/2012 1027   LEUKOCYTESUR NEGATIVE 06/13/2012 1027   Sepsis Labs Invalid input(s): PROCALCITONIN,  WBC,  LACTICIDVEN   Time  coordinating discharge: 35 minutes  SIGNED:  Marzetta Board, MD  [facility] 06/08/2018, 3:41 PM Pager [phone]  If 7PM-7AM, please contact night-coverage [email or web address] Password [email or web address]

## 2018-06-08 NOTE — Progress Notes (Signed)
Progress Note  Patient Name: Parker Odonnell. Date of Encounter: 06/08/2018  Primary Cardiologist: Larae Grooms, MD   Subjective   Patient reports continued improvement, was seen ambulating the hall. Feels up to going home.   Inpatient Medications    Scheduled Meds: . apixaban  2.5 mg Oral BID  . atorvastatin  40 mg Oral q1800  . furosemide  20 mg Oral Daily  . lisinopril  20 mg Oral Daily  . multivitamin with minerals  1 tablet Oral Daily  . nitrofurantoin (macrocrystal-monohydrate)  100 mg Oral Q12H  . omega-3 acid ethyl esters  1 g Oral BID  . sodium chloride flush  3 mL Intravenous Q12H  . spironolactone  12.5 mg Oral Daily  . vitamin B-12  100 mcg Oral Daily   Continuous Infusions: . sodium chloride     PRN Meds: sodium chloride, acetaminophen, ondansetron (ZOFRAN) IV, polyvinyl alcohol, sodium chloride flush   Vital Signs    Vitals:   06/07/18 1143 06/07/18 2007 06/08/18 0453 06/08/18 0914  BP: 134/85 (!) 150/80 (!) 171/59 124/87  Pulse: 71 70 (!) 59 75  Resp: 20 16 16 18   Temp: 97.9 F (36.6 C) 97.9 F (36.6 C) 97.6 F (36.4 C) 98.1 F (36.7 C)  TempSrc: Oral Oral Oral Oral  SpO2: 99% 100% 100% 95%  Weight:   57.2 kg   Height:        Intake/Output Summary (Last 24 hours) at 06/08/2018 1101 Last data filed at 06/08/2018 0900 Gross per 24 hour  Intake 600 ml  Output 1525 ml  Net -925 ml   Filed Weights   06/06/18 0110 06/07/18 0432 06/08/18 0453  Weight: 60.7 kg 58.6 kg 57.2 kg    Telemetry    Atrial fibrillation/flutter with controlled ventricular rate - Personally Reviewed  Physical Exam   GEN: Sitting upright in bed in no acute distress.   Neck: JVD at clavicle at 30 degrees in bed, no carotid bruits Cardiac: Irregular rhythm, regular rate, no murmurs, rubs, or gallops.  Respiratory: Clear to auscultation bilaterally, no wheezes/ rales/ rhonchi GI: NABS, Soft, nontender, non-distended  MS: significantly reduce edema, trace now; No  deformity. Neuro:  Nonfocal, moving all extremities spontaneously Psych: Normal affect   Labs    Chemistry Recent Labs  Lab 06/06/18 0642 06/07/18 0419 06/08/18 0416  NA 140 136 134*  K 3.8 4.1 3.9  CL 102 103 102  CO2 30 27 25   GLUCOSE 103* 114* 94  BUN 10 13 14   CREATININE 0.92 1.04 0.91  CALCIUM 9.4 9.3 9.0  GFRNONAA >60 >60 >60  GFRAA >60 >60 >60  ANIONGAP 8 6 7      Hematology Recent Labs  Lab 06/05/18 0449 06/06/18 0642 06/08/18 0416  WBC 4.3 4.9 5.1  RBC 3.62* 3.69* 3.49*  HGB 12.4* 12.6* 11.8*  HCT 37.6* 38.3* 35.7*  MCV 103.9* 103.8* 102.3*  MCH 34.3* 34.1* 33.8  MCHC 33.0 32.9 33.1  RDW 13.4 13.3 13.2  PLT 115* 114* 105*    Cardiac Enzymes Recent Labs  Lab 06/04/18 1051 06/04/18 1624 06/04/18 2201  TROPONINI <0.03 <0.03 <0.03    Recent Labs  Lab 06/03/18 2132  TROPIPOC 0.03     BNP Recent Labs  Lab 06/03/18 2112  BNP 1,225.8*     DDimer No results for input(s): DDIMER in the last 168 hours.   Radiology    No results found.  Cardiac Studies   Echocardiogram 06/04/18: Study Conclusions  - Left ventricle: The  cavity size was normal. Systolic function was   severely reduced. The estimated ejection fraction was in the   range of 25% to 30%. Diffuse hypokinesis with worse hypokinesis   at the apex. - Ventricular septum: Septal motion showed abnormal function and   dyssynergy. These changes are consistent with a left bundle   branch block. - Aortic valve: There was trivial regurgitation. - Mitral valve: There was mild regurgitation. - Left atrium: The atrium was moderately to severely dilated. - Right ventricle: The cavity size was mildly to moderately   dilated. Wall thickness was normal. Systolic function was mildly   to moderately reduced. - Right atrium: The atrium was moderately to severely dilated. - Atrial septum: No defect or patent foramen ovale was identified. - Tricuspid valve: There was moderate regurgitation. -  Pulmonary arteries: Systolic pressure was severely increased. PA   peak pressure: 78 mm Hg (S). - Pericardium, extracardiac: There was a left pleural effusion.  Impressions:  - Severely reduced LVEF with global hypokinesis, visually worse at   the apex, and dyssynchrony. Biatrial enlargement. Severe   pulmonary hypertension with PASP 78 mmHg. Left pleural effusion.  Patient Profile     84 y.o. male with PMH CAD s/p LAD stent in 1610, chronic systolic and diastolic heart failure with acute exacerbation, atrial fibrillation/atrial flutter on anticoagulation who is admitted for acute on chronic systolic and diastolic heart failure.  Assessment & Plan    1. Acute on chronic systolic and diastolic CHF: patient has continues to diurese well. Total net negative 6.7 L this admission. Weight is down from 68.9>57.2 kg today. He reports improvement in his breathing and LE edema. With clear lungs today and trace LE edema.  - starting po 20mg  daily furosemide.  - Continue lisinopril and spironolactone  - Consider transition to entresto as outpatient  2. Atrial fibrillation/flutter: on apixaban. BBlocker held due to bradycardia.  - Could consider cardioversion outpatient  - Continue apixaban, dose reduced as his weight has gone down to <60 kg and his age is > 82.   3. History of CAD: no anginal complaints. ASA discontinued given need for anticoagulation - Continue statin  4. HTN: BP overall stable - Continue lisinopril and spironolactone for now. Could consider transition to [facility] outpatient vs uptitration of lisinopril outpatient.    Discussed that overall, patient may benefit from entresto, cardioversion, and/or CRT in the future. He is feeling well and would like to evaluate further after discharge. He has an appointment in two days with Dr. Irish Lack. Recommend repeat BMET at that visit.   For questions or updates, please contact [place] Please consult [email or web address] for contact  info under Cardiology/STEMI.   [facility] will sign off in anticipation of discharge.   Medication Recommendations:  continue apixaban at 2.5 mg BID dosing, atorva 40 mg, lasix 20 mg, lisinopril 20 mg, spironolactone 12.5 mg Other recommendations (labs, testing, etc):  BMET at his follow up visit Follow up as an outpatient:  Has appt with [place] in two days.    Signed, Buford Dresser, MD  06/08/2018, 11:01 AM

## 2018-06-08 NOTE — Progress Notes (Signed)
Physical Therapy Treatment Patient Details Name: Parker Odonnell. MRN: [id number] DOB: [birth year] Today's Date: 06/08/2018    History of Present Illness Pt is an 84 y.o. male with medical history significant for paroxysmal atrial fibrillation on Eliquis, coronary artery disease, hypertension, chronic left bundle branch block, and chronic thrombocytopenia. He presented to the emergency department for evaluation of shortness of breath and bilateral leg swelling.    PT Comments    Patient doing well today with therapy, increased ambulation distance with SPC. No DOE SOB or LOB with ambulating. Mild unsteadiness noted agree with HHPT recs for balance and maximizing safety.     Follow Up Recommendations  Home health PT;Supervision - Intermittent     Equipment Recommendations  None recommended by PT    Recommendations for Other Services       Precautions / Restrictions Precautions Precautions: Fall Restrictions Weight Bearing Restrictions: No    Mobility  Bed Mobility Overal bed mobility: Modified Independent             General bed mobility comments: seated EoB on entry  Transfers Overall transfer level: Needs assistance Equipment used: Straight cane Transfers: Sit to/from Stand Sit to Stand: Supervision            Ambulation/Gait Ambulation/Gait assistance: Min guard Gait Distance (Feet): 230 Feet Assistive device: Straight cane Gait Pattern/deviations: Step-through pattern;Decreased stride length Gait velocity: decreased   General Gait Details: pt ammbulates with SPC without overt LOB, mild unsteadiness noted pt c/o of L hip pain from layign in bed too long today. pt with no DOE or SOB   Stairs             Wheelchair Mobility    Modified Rankin (Stroke Patients Only)       Balance Overall balance assessment: Mild deficits observed, not formally tested                                          Cognition Arousal/Alertness:  Awake/alert Behavior During Therapy: WFL for tasks assessed/performed Overall Cognitive Status: Within Functional Limits for tasks assessed                                        Exercises      General Comments        Pertinent Vitals/Pain Pain Assessment: No/denies pain    Home Living                      Prior Function            PT Goals (current goals can now be found in the care plan section) Acute Rehab PT Goals Patient Stated Goal: feel better PT Goal Formulation: With patient Time For Goal Achievement: 06/18/18 Potential to Achieve Goals: Good Progress towards PT goals: Progressing toward goals    Frequency    Min 3X/week      PT Plan Current plan remains appropriate    Co-evaluation              AM-PAC PT "6 Clicks" Daily Activity  Outcome Measure  Difficulty turning over in bed (including adjusting bedclothes, sheets and blankets)?: A Little Difficulty moving from lying on back to sitting on the side of the bed? : A Little Difficulty sitting down on and  standing up from a chair with arms (e.g., wheelchair, bedside commode, etc,.)?: A Little Help needed moving to and from a bed to chair (including a wheelchair)?: None Help needed walking in hospital room?: None Help needed climbing 3-5 steps with a railing? : A Little 6 Click Score: 20    End of Session Equipment Utilized During Treatment: Gait belt Activity Tolerance: Patient tolerated treatment well Patient left: Other (comment);with call bell/phone within reach Nurse Communication: Mobility status PT Visit Diagnosis: Difficulty in walking, not elsewhere classified (R26.2)     Time: 8372-9021 PT Time Calculation (min) (ACUTE ONLY): 22 min  Charges:  $Gait Training: 8-22 mins                     Reinaldo Berber, PT, DPT Acute Rehab Services Pager: [phone]     Reinaldo Berber 06/08/2018, 2:51 PM

## 2018-06-10 ENCOUNTER — Ambulatory Visit (INDEPENDENT_AMBULATORY_CARE_PROVIDER_SITE_OTHER): Payer: Medicare Other | Admitting: Interventional Cardiology

## 2018-06-10 ENCOUNTER — Encounter: Payer: Self-pay | Admitting: Interventional Cardiology

## 2018-06-10 VITALS — BP 124/50 | HR 66 | Ht 69.0 in | Wt 133.0 lb

## 2018-06-10 DIAGNOSIS — E785 Hyperlipidemia, unspecified: Secondary | ICD-10-CM

## 2018-06-10 DIAGNOSIS — I5023 Acute on chronic systolic (congestive) heart failure: Secondary | ICD-10-CM | POA: Diagnosis not present

## 2018-06-10 DIAGNOSIS — I447 Left bundle-branch block, unspecified: Secondary | ICD-10-CM

## 2018-06-10 DIAGNOSIS — I2510 Atherosclerotic heart disease of native coronary artery without angina pectoris: Secondary | ICD-10-CM | POA: Diagnosis not present

## 2018-06-10 DIAGNOSIS — I10 Essential (primary) hypertension: Secondary | ICD-10-CM | POA: Diagnosis not present

## 2018-06-10 DIAGNOSIS — I482 Chronic atrial fibrillation: Secondary | ICD-10-CM | POA: Diagnosis not present

## 2018-06-10 DIAGNOSIS — I4821 Permanent atrial fibrillation: Secondary | ICD-10-CM

## 2018-06-10 NOTE — Progress Notes (Signed)
Cardiology Office Note   Date:  06/10/2018   ID:  Waterview Sink., DOB 04/13/1932, MRN [id number]  PCP:  Lawerance Cruel, MD    No chief complaint on file.    Wt Readings from Last 3 Encounters:  06/10/18 133 lb (60.3 kg)  06/08/18 126 lb 3.2 oz (57.2 kg)  01/21/18 148 lb (67.1 kg)       History of Present Illness: Parker Odonnell. is a 84 y.o. male  who had an LAD stent in 1997.  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 He now lives in an apartment after his wife passed away a few years ago.   Hernia surgery in 2013 went well. No cardiac issues at that time. No ischemia by stress test at that time.  He was noted to have an irregular heart beat in early 2019 with his PMD. He was referred here to evaluate.  In March 2019, echo:  Left ventricle: The cavity size was normal. Wall thickness was increased in a pattern of mild LVH. There was mild concentric hypertrophy. Systolic function was moderately reduced. The estimated ejection fraction was in the range of 35% to 40%. Moderate diffuse hypokinesis with regional variations. There appears to be disproportionate hypokinesis of the apex and the inferolateral wall. ; in the distribution of multiple vessels. No evidence of thrombus. - Ventricular septum: Septal motion showed abnormal function, dyssynergy, and paradox. - Mitral valve: There was mild regurgitation. - Left atrium: The atrium was moderately to severely dilated. - Right ventricle: The cavity size was mildly dilated. - Right atrium: The atrium was moderately to severely dilated. - Atrial septum: No defect or patent foramen ovale was identified. - Tricuspid valve: There was mild-moderate regurgitation directed centrally. - Pulmonary arteries: PA peak pressure: 42 mm Hg (S).  He was hospitalized in 2019 for CHF: "Acute systolic Heart failure exacerbation Last EF 35-40%, cardiology  consulted and followed patient. He was initially maintained on IV Lasix, fluid status improved and was converted to po. He was also started on Spironolactone, and his HCTZ was discontinued. Repeat ECHO. Lower EF 25--30 %. He was net negative 7 L, dyspnea has resolved and patient returned to baseline. "  Eliquis dose was decreased as well.  EF was 25-30%.  Past Medical History:  Diagnosis Date  . ASCVD (arteriosclerotic cardiovascular disease)    single vessel  . Coronary atherosclerosis of native coronary artery   . Dyslipidemia   . ED (erectile dysfunction)   . Hypertension   . Inguinal hernia   . Left bundle branch block    chronic  . Postsurgical percutaneous transluminal coronary angioplasty status     Past Surgical History:  Procedure Laterality Date  . CARDIAC CATHETERIZATION  1998  . CARDIOVASCULAR STRESS TEST  05/25/12  . CHOLECYSTECTOMY  1979  . [place]  . INGUINAL HERNIA REPAIR  06/15/2012   Procedure: HERNIA REPAIR INGUINAL ADULT;  Surgeon: Adin Hector, MD;  Location: [facility];  Service: General;  Laterality: Bilateral;  Bilateral Inguinal Hernia Repair with Ultrapro Mesh     Current Outpatient Medications  Medication Sig Dispense Refill  . acetaminophen (TYLENOL) 500 MG tablet Take 500 mg by mouth every 6 (six) hours as needed for mild pain, moderate pain or headache.    Marland Kitchen apixaban (ELIQUIS) 2.5 MG TABS tablet Take 1 tablet (2.5 mg total) by mouth 2 (two) times  daily. 60 tablet 0  . atorvastatin (LIPITOR) 40 MG tablet TAKE 1 TABLET (40 MG TOTAL) BY MOUTH DAILY. 30 tablet 11  . furosemide (LASIX) 20 MG tablet Take 1 tablet (20 mg total) by mouth daily. 30 tablet 0  . lisinopril (PRINIVIL,ZESTRIL) 20 MG tablet Take 1 tablet (20 mg total) by mouth daily. 30 tablet 0  . Multiple Vitamins-Minerals ([employer] SPECTRAVITE PO) Take by mouth every other day.    . Multiple Vitamins-Minerals (OCUVITE-LUTEIN PO) Take 1 capsule by mouth daily.    . Omega-3 Fatty  Acids (FISH OIL) 1200 MG CAPS Take 2 capsules by mouth 2 (two) times daily at 8 am and 10 pm.    . Polyethyl Glycol-Propyl Glycol (SYSTANE) 0.4-0.3 % SOLN Apply 1 drop to eye 2 (two) times daily as needed (dry eyes).     . potassium chloride SA (KLOR-CON M20) 20 MEQ tablet Take 1 tablet (20 mEq total) by mouth daily. 60 tablet 5  . spironolactone (ALDACTONE) 25 MG tablet Take 0.5 tablets (12.5 mg total) by mouth daily. 30 tablet 0  . vitamin B-12 100 MCG tablet Take 1 tablet (100 mcg total) by mouth daily. 30 tablet 0  . carvedilol (COREG) 3.125 MG tablet Take 1 tablet (3.125 mg total) by mouth 2 (two) times daily. 180 tablet 3   No current facility-administered medications for this visit.     Allergies:   Patient has no known allergies.    Social History:  The patient  reports that he has never smoked. He has never used smokeless tobacco. He reports that he does not drink alcohol or use drugs.   Family History:  The patient's family history includes Arthritis in his father; Hypertension in his paternal uncle.    ROS:  Please see the history of present illness.   Otherwise, review of systems are positive for improved lower extremity edema.   All other systems are reviewed and negative.    PHYSICAL EXAM: VS:  BP (!) 124/50   Pulse 66   Ht 5\' 9"  (1.753 m)   Wt 133 lb (60.3 kg)   SpO2 99%   BMI 19.64 kg/m  , BMI Body mass index is 19.64 kg/m. GEN: Well nourished, well developed, in no acute distress  HEENT: normal  Neck: no JVD, carotid bruits, or masses Cardiac: Irregularly irregular, normal rate; no murmurs, rubs, or gallops,no edema  Respiratory:  clear to auscultation bilaterally, normal work of breathing GI: soft, nontender, nondistended, + BS MS: no deformity or atrophy  Skin: warm and dry, no rash Neuro:  Strength and sensation are intact Psych: euthymic mood, full affect      Recent Labs: 06/03/2018: B Natriuretic Peptide 1,225.8 06/04/2018: Magnesium 1.8 06/08/2018:  BUN 14; Creatinine, Ser 0.91; Hemoglobin 11.8; Platelets 105; Potassium 3.9; Sodium 134   Lipid Panel    Component Value Date/Time   CHOL 104 12/23/2016 0804   CHOL 110 01/04/2014 0822   TRIG 62 12/23/2016 0804   TRIG 72 01/04/2014 0822   HDL 47 12/23/2016 0804   HDL 40 01/04/2014 0822   CHOLHDL 2.2 12/23/2016 0804   CHOLHDL 2.4 12/19/2015 0825   VLDL 11 12/19/2015 0825   LDLCALC 45 12/23/2016 0804   LDLCALC 56 01/04/2014 0822     Other studies Reviewed: Additional studies/ records that were reviewed today with results demonstrating: hospital records reviewed .   ASSESSMENT AND PLAN:  1. CAD: Known LAD stent in 1997.  Ejection fraction was normal in 2004.  Now, given decreased  ejection fraction and heart failure, would need to consider left and right heart cath.  This was discussed with the patient and his daughter.  They were also considering hip replacement for the patient.  Prior to clearing him for hip replacement, I think he would need a heart cath.  They will consider this and get back to [place] with a decision.  For now, the patient is feeling much better. 2. Acute on chronic systolic heart failure: Much improved.  BMet with Dr. Harrington Challenger next week.  Continue Spironolcatone, Lasix.  Appears euvolemic.  3. AFib/flutter: Not having any symptoms of palpitations.  Eliquis for stroke prevention. 4. HTN: Blood pressure well controlled.  Continue current medications. 5. Hyperlipidemia: LDL 45.  Continue lipid-lowering therapy.  He is due for another recheck of his lipids.  He has a follow-up appointment with Dr. Harrington Challenger next week. 6. LBBB: Chronic.   Current medicines are reviewed at length with the patient today.  The patient concerns regarding his medicines were addressed.  The following changes have been made:  No change  Labs/ tests ordered today include:  No orders of the defined types were placed in this encounter.   Recommend 150 minutes/week of aerobic exercise Low fat, low carb,  high fiber diet recommended  Disposition:   FU in 1 month   Signed, Larae Grooms, MD  06/10/2018 2:15 PM    [facility] [place], [place], [place]  [address] Phone: [phone]; Fax: [phone]

## 2018-06-10 NOTE — Patient Instructions (Addendum)
Medication Instructions:  Your physician recommends that you continue on your current medications as directed. Please refer to the Current Medication list given to you today.   Labwork: None ordered  Testing/Procedures: None ordered  Follow-Up: Your physician wants you to keep your follow up appointment on 07/14/18 at 11:00  Any Other Special Instructions Will Be Listed Below (If Applicable).  CALL OUR OFFICE IF YOUR SYMPTOMS CHANGE OR WORSEN   If you need a refill on your cardiac medications before your next appointment, please call your pharmacy.

## 2018-06-16 ENCOUNTER — Telehealth (HOSPITAL_COMMUNITY): Payer: Self-pay | Admitting: Interventional Cardiology

## 2018-06-16 NOTE — Telephone Encounter (Signed)
Returned call to [employer] (DPR on file). She states that the patient had labs drawn at PCP office the other day. She states that his potassium was high and they stopped his Klor-Con and they instructed him to take OTC Vitamin B Complex for anemia. Reviewed labs drawn on 8/27 in [facility] and Cr- 0.92, K-5.4, Hgb-12.2. She states that he has been feeling fine and denies having any SOB, LEE, CP, or any other Sx. Made [name] aware that I would forward the information and for her to let us know if his Sx change or worsen. She verbalized understanding and thanked me for the call. Med list updated.

## 2018-06-16 NOTE — Telephone Encounter (Signed)
New Message:    Please call, she says she needs to give you an update from pt's Primary Care doctor.

## 2018-07-13 NOTE — Progress Notes (Signed)
Cardiology Office Note   Date:  07/14/2018   ID:  Parker Sink., DOB 1932/09/04, MRN [id number]  PCP:  Parker Cruel, MD    No chief complaint on file.  CAD, systolic heart failure  Wt Readings from Last 3 Encounters:  07/14/18 132 lb 12.8 oz (60.2 kg)  06/10/18 133 lb (60.3 kg)  06/08/18 126 lb 3.2 oz (57.2 kg)       History of Present Illness: Parker Odonnell. is a 84 y.o. male  who had an LAD stent in 1997.  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 He now lives in an apartment after his wife passed away a few years ago.   Hernia surgery in 2013 went well. No cardiac issues at that time. No ischemia by stress test at that time.  He was noted to have an irregular heart beat in early 2019 with his PMD. He was referred here to evaluate.  In March 2019, echo:  Left ventricle: The cavity size was normal. Wall thickness was increased in a pattern of mild LVH. There was mild concentric hypertrophy. Systolic function was moderately reduced. The estimated ejection fraction was in the range of 35% to 40%. Moderate diffuse hypokinesis with regional variations. There appears to be disproportionate hypokinesis of the apex and the inferolateral wall. ; in the distribution of multiple vessels. No evidence of thrombus. - Ventricular septum: Septal motion showed abnormal function, dyssynergy, and paradox. - Mitral valve: There was mild regurgitation. - Left atrium: The atrium was moderately to severely dilated. - Right ventricle: The cavity size was mildly dilated. - Right atrium: The atrium was moderately to severely dilated. - Atrial septum: No defect or patent foramen ovale was identified. - Tricuspid valve: There was mild-moderate regurgitation directed centrally. - Pulmonary arteries: PA peak pressure: 42 mm Hg (S).  He was hospitalized in 2019 for CHF: "Acute systolic Heart failure  exacerbation Last EF 35-40%, cardiology consulted and followed patient. He was initially maintained on IV Lasix, fluid status improved and was converted to po. He was also started on Spironolactone, and his HCTZ was discontinued.Repeat ECHO. Lower EF 25--30 %. He was net negative 7 L, dyspnea has resolved and patient returned to baseline. "  Parker Odonnell dose was decreased as well.  EF was 25-30%.  At the visit in 8/19 we discussed the following: "Known LAD stent in 1997.  Ejection fraction was normal in 2004.  Now, given decreased ejection fraction and heart failure, would need to consider left and right heart cath.  This was discussed with the patient and his daughter.  They were also considering hip replacement for the patient.  Prior to clearing him for hip replacement, I think he would need a heart cath.  They will consider this and get back to [place] with a decision. ."  He is still considering hip surgery.  Overall, he has been feeling well. Only invasive option is surgery for the hip.  Hip pain has improved since the diuresis.  It only hurts a little when he stands.  He walks with a cane.  No recent falls.  Memory improved as well after .    Past Medical History:  Diagnosis Date  . ASCVD (arteriosclerotic cardiovascular disease)    single vessel  . Coronary atherosclerosis of native coronary artery   . Dyslipidemia   . ED (erectile dysfunction)   . Hypertension   . Inguinal hernia   .  Left bundle branch block    chronic  . Postsurgical percutaneous transluminal coronary angioplasty status     Past Surgical History:  Procedure Laterality Date  . CARDIAC CATHETERIZATION  1998  . CARDIOVASCULAR STRESS TEST  05/25/12  . CHOLECYSTECTOMY  1979  . [place]  . INGUINAL HERNIA REPAIR  06/15/2012   Procedure: HERNIA REPAIR INGUINAL ADULT;  Surgeon: Adin Hector, MD;  Location: [facility];  Service: General;  Laterality: Bilateral;  Bilateral Inguinal Hernia Repair with  Ultrapro Mesh     Current Outpatient Medications  Medication Sig Dispense Refill  . acetaminophen (TYLENOL) 500 MG tablet Take 500 mg by mouth every 6 (six) hours as needed for mild pain, moderate pain or headache.    Marland Kitchen apixaban (Parker Odonnell) 2.5 MG TABS tablet Take 1 tablet (2.5 mg total) by mouth 2 (two) times daily. 60 tablet 0  . atorvastatin (LIPITOR) 40 MG tablet TAKE 1 TABLET (40 MG TOTAL) BY MOUTH DAILY. 30 tablet 11  . b complex vitamins tablet Take 1 tablet by mouth daily.    . furosemide (LASIX) 20 MG tablet Take 1 tablet (20 mg total) by mouth daily. 30 tablet 0  . lisinopril (PRINIVIL,ZESTRIL) 20 MG tablet Take 1 tablet (20 mg total) by mouth daily. 30 tablet 0  . Multiple Vitamins-Minerals (CVS SPECTRAVITE PO) Take by mouth every other day.    . Multiple Vitamins-Minerals (OCUVITE-LUTEIN PO) Take 1 capsule by mouth daily.    . Omega-3 Fatty Acids (FISH OIL) 1200 MG CAPS Take 2 capsules by mouth 2 (two) times daily at 8 am and 10 pm.    . Polyethyl Glycol-Propyl Glycol (SYSTANE) 0.4-0.3 % SOLN Apply 1 drop to eye 2 (two) times daily as needed (dry eyes).     Marland Kitchen spironolactone (ALDACTONE) 25 MG tablet Take 0.5 tablets (12.5 mg total) by mouth daily. 30 tablet 0  . carvedilol (COREG) 3.125 MG tablet Take 1 tablet (3.125 mg total) by mouth 2 (two) times daily. 180 tablet 3   No current facility-administered medications for this visit.     Allergies:   Patient has no known allergies.    Social History:  The patient  reports that he has never smoked. He has never used smokeless tobacco. He reports that he does not drink alcohol or use drugs.   Family History:  The patient's family history includes Arthritis in his father; Hypertension in his paternal uncle.    ROS:  Please see the history of present illness.   Otherwise, review of systems are positive for occasional hip pain.   All other systems are reviewed and negative.    PHYSICAL EXAM: VS:  BP (!) 98/50   Pulse 66   Ht 5'  9" (1.753 m)   Wt 132 lb 12.8 oz (60.2 kg)   SpO2 99%   BMI 19.61 kg/m  , BMI Body mass index is 19.61 kg/m. GEN: Well nourished, well developed, in no acute distress  HEENT: normal  Neck: no JVD, carotid bruits, or masses Cardiac: Rate controlled; no murmurs, rubs, or gallops,no edema  Respiratory:  clear to auscultation bilaterally, normal work of breathing GI: soft, nontender, nondistended, + BS MS: no deformity or atrophy  Skin: warm and dry, no rash Neuro:  Slow gait Psych: euthymic mood, full affect    Recent Labs: 06/03/2018: B Natriuretic Peptide 1,225.8 06/04/2018: Magnesium 1.8 06/08/2018: BUN 14; Creatinine, Ser 0.91; Hemoglobin 11.8; Platelets 105; Potassium 3.9; Sodium 134   Lipid Panel  Component Value Date/Time   CHOL 104 12/23/2016 0804   CHOL 110 01/04/2014 0822   TRIG 62 12/23/2016 0804   TRIG 72 01/04/2014 0822   HDL 47 12/23/2016 0804   HDL 40 01/04/2014 0822   CHOLHDL 2.2 12/23/2016 0804   CHOLHDL 2.4 12/19/2015 0825   VLDL 11 12/19/2015 0825   LDLCALC 45 12/23/2016 0804   LDLCALC 56 01/04/2014 0822     Other studies Reviewed: Additional studies/ records that were reviewed today with results demonstrating: hospital records noted.   ASSESSMENT AND PLAN:  1. CAD: No angina.  Continue aggressive secondary prevention.  2. Acute on chronic systolic heart failure: Spoke about w/u and need for surgery.  Hip sx are fairly well controlled.  Would normally consider cath, especially if he was going to have surgery.  However, he is feeling well.  His hip is better after diuresing 15 lbs in 8/19.  We discussed that perhaps conservative management is the right treatment path rather than surgical management.  Will forward to Dr. Alvan Dame for his opinion.  THe patient's surgical risk with a low EF is now hihger than it was in the past.  Family member was present for discussion as well.  She is in agreement with the plan of conservative management due to his age and  other comorbidities.  If symptoms change, we can change our strategy.   3. AFib: Rate controlled.  Parker Odonnell for stroke prevention.  4. HTN: COntrolled.  5. Hyperlipidemia:  6. Chronic LBBB.   Current medicines are reviewed at length with the patient today.  The patient concerns regarding his medicines were addressed.  The following changes have been made:  No change  Labs/ tests ordered today include:  No orders of the defined types were placed in this encounter.   Recommend 150 minutes/week of aerobic exercise Low fat, low carb, high fiber diet recommended  Disposition:   FU in 6 months   Signed, Larae Grooms, MD  07/14/2018 11:22 AM    [facility] [place], [place], [place]  [address] Phone: [phone]; Fax: [phone]

## 2018-07-14 ENCOUNTER — Ambulatory Visit (INDEPENDENT_AMBULATORY_CARE_PROVIDER_SITE_OTHER): Payer: Medicare Other | Admitting: Interventional Cardiology

## 2018-07-14 ENCOUNTER — Encounter: Payer: Self-pay | Admitting: Interventional Cardiology

## 2018-07-14 VITALS — BP 98/50 | HR 66 | Ht 69.0 in | Wt 132.8 lb

## 2018-07-14 DIAGNOSIS — I5023 Acute on chronic systolic (congestive) heart failure: Secondary | ICD-10-CM

## 2018-07-14 DIAGNOSIS — I4821 Permanent atrial fibrillation: Secondary | ICD-10-CM

## 2018-07-14 DIAGNOSIS — I10 Essential (primary) hypertension: Secondary | ICD-10-CM | POA: Diagnosis not present

## 2018-07-14 DIAGNOSIS — E785 Hyperlipidemia, unspecified: Secondary | ICD-10-CM

## 2018-07-14 DIAGNOSIS — I447 Left bundle-branch block, unspecified: Secondary | ICD-10-CM

## 2018-07-14 DIAGNOSIS — I482 Chronic atrial fibrillation: Secondary | ICD-10-CM | POA: Diagnosis not present

## 2018-07-14 DIAGNOSIS — I2510 Atherosclerotic heart disease of native coronary artery without angina pectoris: Secondary | ICD-10-CM | POA: Diagnosis not present

## 2018-07-14 NOTE — Patient Instructions (Signed)

## 2018-12-12 ENCOUNTER — Other Ambulatory Visit: Payer: Self-pay | Admitting: Interventional Cardiology

## 2018-12-20 ENCOUNTER — Other Ambulatory Visit: Payer: Self-pay | Admitting: Interventional Cardiology

## 2019-01-03 ENCOUNTER — Other Ambulatory Visit: Payer: Self-pay | Admitting: Interventional Cardiology

## 2019-06-30 ENCOUNTER — Other Ambulatory Visit: Payer: Self-pay | Admitting: Interventional Cardiology

## 2019-07-24 ENCOUNTER — Other Ambulatory Visit: Payer: Self-pay | Admitting: Interventional Cardiology

## 2019-08-15 ENCOUNTER — Telehealth: Payer: Self-pay | Admitting: Interventional Cardiology

## 2019-08-15 NOTE — Telephone Encounter (Signed)
  Daughter is calling because her dad had a conversation with Dr Irish Lack at last visit regarding him needing a hip replacement. Dr Irish Lack he would need an angiogram before that could happen. She would like to discuss having that done.

## 2019-08-15 NOTE — Telephone Encounter (Signed)
Left message for patient to call back  

## 2019-08-15 NOTE — Telephone Encounter (Signed)
Andrea returning call. °

## 2019-08-15 NOTE — Telephone Encounter (Signed)
Left message for daughter to call back.  

## 2019-08-16 ENCOUNTER — Other Ambulatory Visit: Payer: Self-pay | Admitting: Interventional Cardiology

## 2019-08-23 ENCOUNTER — Telehealth: Payer: Self-pay | Admitting: Interventional Cardiology

## 2019-08-23 MED ORDER — ATORVASTATIN CALCIUM 40 MG PO TABS: 40.0000 mg | ORAL_TABLET | Freq: Every day | ORAL | 0 refills | Status: DC

## 2019-08-23 MED ORDER — CARVEDILOL 3.125 MG PO TABS: 3.1250 mg | ORAL_TABLET | Freq: Two times a day (BID) | ORAL | 0 refills | Status: DC

## 2019-08-23 NOTE — Telephone Encounter (Signed)
New Message      *STAT* If patient is at the pharmacy, call can be transferred to refill team.   1. Which medications need to be refilled? (please list name of each medication and dose if known) Carvedilol and Atorvastatin   2. Which pharmacy/location (including street and city if local pharmacy) is medication to be sent to? [facility]  Phone [phone] Fax [phone]   3. Do they need a 30 day or 90 day supply? [facility]

## 2019-08-23 NOTE — Telephone Encounter (Signed)
Pt's medications were sent to pt's pharmacy as requested. Confirmation received.  

## 2019-08-24 NOTE — Telephone Encounter (Signed)
Called and spoke to daughter. She states that the patient is going to move forward with hip surgery and Dr. Irish Lack mentioned the possible need for angiogram prior to. Patient has not been seen in over 1 year. Appointment made with Dr. Irish Lack on 11/13 for routine f/u and discuss clearance for hip surgery.

## 2019-08-24 NOTE — Telephone Encounter (Signed)
Left message for daughter to call back.  

## 2019-08-27 ENCOUNTER — Other Ambulatory Visit: Payer: Self-pay | Admitting: Interventional Cardiology

## 2019-08-30 NOTE — Progress Notes (Signed)
Cardiology Office Note   Date:  09/01/2019   ID:  Parker Sink., DOB 1932/03/20, MRN [id number]  PCP:  Lawerance Cruel, MD    No chief complaint on file.  CAD  Wt Readings from Last 3 Encounters:  09/01/19 138 lb 12.8 oz (63 kg)  07/14/18 132 lb 12.8 oz (60.2 kg)  06/10/18 133 lb (60.3 kg)       History of Present Illness: Parker Odonnell. is a 85 y.o. male  who had an LAD stent in 1997.  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 He now lives in an apartment after his wife passed away a few years ago.   Hernia surgery in 2013 went well. No cardiac issues at that time. No ischemia by stress test at that time.  He was noted to have an irregular heart beat in early 2019 with his PMD. He was referred here to evaluate.  In March 2019, echo:  Left ventricle: The cavity size was normal. Wall thickness was increased in a pattern of mild LVH. There was mild concentric hypertrophy. Systolic function was moderately reduced. The estimated ejection fraction was in the range of 35% to 40%. Moderate diffuse hypokinesis with regional variations. There appears to be disproportionate hypokinesis of the apex and the inferolateral wall. ; in the distribution of multiple vessels. No evidence of thrombus. - Ventricular septum: Septal motion showed abnormal function, dyssynergy, and paradox. - Mitral valve: There was mild regurgitation. - Left atrium: The atrium was moderately to severely dilated. - Right ventricle: The cavity size was mildly dilated. - Right atrium: The atrium was moderately to severely dilated. - Atrial septum: No defect or patent foramen ovale was identified. - Tricuspid valve: There was mild-moderate regurgitation directed centrally. - Pulmonary arteries: PA peak pressure: 42 mm Hg (S).  He was hospitalized in 2019 for CHF: "Acute systolic Heart failure exacerbation Last EF 35-40%,  cardiology consulted and followed patient. He was initially maintained on IV Lasix, fluid status improved and was converted to po. He was also started on Spironolactone, and his HCTZ was discontinued.Repeat ECHO. Lower EF 25--30 %. He was net negative 7 L, dyspnea has resolved and patient returned to baseline."  Eliquis dose was decreased as well. EF was 25-30%.  At the visit in 8/19 we discussed the following: "Known LAD stent in 1997. Ejection fraction was normal in 2004. Now, given decreased ejection fraction and heart failure, would need to consider left and right heart cath. This was discussed with the patient and his daughter. They were also considering hip replacement for the patient. Prior to clearing him for hip replacement, I think he would need a heart cath. They will consider this and get back to [place] with a decision. ."  " Spoke about w/u and need for surgery.  Hip sx are fairly well controlled.  Would normally consider cath, especially if he was going to have surgery.  However, he is feeling well.  His hip is better after diuresing 15 lbs in 8/19.  We discussed that perhaps conservative management is the right treatment path rather than surgical management.  Will forward to Dr. Alvan Dame for his opinion.  THe patient's surgical risk with a low EF is now hihger than it was in the past.  Family member was present for discussion as well.  She is in agreement with the plan of conservative management due to his age and  other comorbidities.  If symptoms change, we can change our strategy.  "  He is still considering hip surgery.    Past Medical History:  Diagnosis Date  . ASCVD (arteriosclerotic cardiovascular disease)    single vessel  . Coronary atherosclerosis of native coronary artery   . Dyslipidemia   . ED (erectile dysfunction)   . Hypertension   . Inguinal hernia   . Left bundle branch block    chronic  . Postsurgical percutaneous transluminal coronary angioplasty status      Past Surgical History:  Procedure Laterality Date  . CARDIAC CATHETERIZATION  1998  . CARDIOVASCULAR STRESS TEST  05/25/12  . CHOLECYSTECTOMY  1979  . [place]  . INGUINAL HERNIA REPAIR  06/15/2012   Procedure: HERNIA REPAIR INGUINAL ADULT;  Surgeon: Adin Hector, MD;  Location: [facility];  Service: General;  Laterality: Bilateral;  Bilateral Inguinal Hernia Repair with Ultrapro Mesh     Current Outpatient Medications  Medication Sig Dispense Refill  . acetaminophen (TYLENOL) 500 MG tablet Take 500 mg by mouth every 6 (six) hours as needed for mild pain, moderate pain or headache.    Marland Kitchen apixaban (ELIQUIS) 2.5 MG TABS tablet Take 1 tablet (2.5 mg total) by mouth 2 (two) times daily. 60 tablet 0  . atorvastatin (LIPITOR) 40 MG tablet TAKE 1 TABLET BY MOUTH EVERY DAY 90 tablet 1  . b complex vitamins tablet Take 1 tablet by mouth daily.    . carvedilol (COREG) 3.125 MG tablet Take 1 tablet (3.125 mg total) by mouth 2 (two) times daily with a meal. Please make overdue appt with Dr. Irish Lack before anymore refills. 2nd attempt 30 tablet 0  . furosemide (LASIX) 20 MG tablet Take 1 tablet (20 mg total) by mouth daily. 30 tablet 0  . lisinopril (PRINIVIL,ZESTRIL) 20 MG tablet Take 1 tablet (20 mg total) by mouth daily. 30 tablet 0  . Multiple Vitamins-Minerals ([employer] SPECTRAVITE PO) Take by mouth every other day.    . Multiple Vitamins-Minerals (OCUVITE-LUTEIN PO) Take 1 capsule by mouth daily.    . Omega-3 Fatty Acids (FISH OIL) 1200 MG CAPS Take 2 capsules by mouth 2 (two) times daily at 8 am and 10 pm.    . Polyethyl Glycol-Propyl Glycol (SYSTANE) 0.4-0.3 % SOLN Apply 1 drop to eye 2 (two) times daily as needed (dry eyes).     Marland Kitchen spironolactone (ALDACTONE) 25 MG tablet Take 0.5 tablets (12.5 mg total) by mouth daily. 30 tablet 0   No current facility-administered medications for this visit.     Allergies:   Patient has no known allergies.    Social History:  The patient   reports that he has never smoked. He has never used smokeless tobacco. He reports that he does not drink alcohol or use drugs.   Family History:  The patient's family history includes Arthritis in his father; Hypertension in his paternal uncle.    ROS:  Please see the history of present illness.   Otherwise, review of systems are positive for .   All other systems are reviewed and negative.    PHYSICAL EXAM: VS:  BP (!) 100/50   Pulse 71   Ht 5\' 9"  (1.753 m)   Wt 138 lb 12.8 oz (63 kg)   SpO2 98%   BMI 20.50 kg/m  , BMI Body mass index is 20.5 kg/m. GEN: Well nourished, well developed, in no acute distress  HEENT: normal  Neck: no JVD, carotid bruits, or  masses Cardiac: RRR; no murmurs, rubs, or gallops,no edema  Respiratory:  clear to auscultation bilaterally, normal work of breathing GI: soft, nontender, nondistended, + BS MS: no deformity or atrophy  Skin: warm and dry, no rash Neuro:  Strength and sensation are intact Psych: euthymic mood, full affect   EKG:   The ekg ordered today demonstrates NSR, LBBB   Recent Labs: No results found for requested labs within last 8760 hours.   Lipid Panel    Component Value Date/Time   CHOL 104 12/23/2016 0804   CHOL 110 01/04/2014 0822   TRIG 62 12/23/2016 0804   TRIG 72 01/04/2014 0822   HDL 47 12/23/2016 0804   HDL 40 01/04/2014 0822   CHOLHDL 2.2 12/23/2016 0804   CHOLHDL 2.4 12/19/2015 0825   VLDL 11 12/19/2015 0825   LDLCALC 45 12/23/2016 0804   LDLCALC 56 01/04/2014 0822     Other studies Reviewed: Additional studies/ records that were reviewed today with results demonstrating: .   ASSESSMENT AND PLAN:  1. CAD: No angina.  CONtinue secondary prevention.  May need cath if EF is still low by echo.  2. Chronic systolic heart failure: Appears euvolemic.   3. AFib: In NSR at this time.  On anticoag for stroke prevention. 4. HTN: The current medical regimen is effective;  continue present plan and medications. 5.  Hyperlipidemia: LDL 49. The current medical regimen is effective;  continue present plan and medications. 6. Chronic LBBB: unchanged  Cardiac catheterization was discussed with the patient fully. The patient understands that risks include but are not limited to stroke (1 in 1000), death (1 in 23), kidney failure [usually temporary] (1 in 500), bleeding (1 in 200), allergic reaction [possibly serious] (1 in 200).  The patient understands and is willing to proceed.    Daughter present for visit.   Current medicines are reviewed at length with the patient today.  The patient concerns regarding his medicines were addressed.  The following changes have been made:  No change  Labs/ tests ordered today include:  No orders of the defined types were placed in this encounter.   Recommend 150 minutes/week of aerobic exercise Low fat, low carb, high fiber diet recommended  Disposition:   FU for echo and possibly cath if EF is abnormal   Signed, Larae Grooms, MD  09/01/2019 4:17 PM    [facility] [place], [place],   [address] Phone: [phone]; Fax: [phone]

## 2019-09-01 ENCOUNTER — Other Ambulatory Visit: Payer: Self-pay

## 2019-09-01 ENCOUNTER — Ambulatory Visit (INDEPENDENT_AMBULATORY_CARE_PROVIDER_SITE_OTHER): Payer: Medicare Other | Admitting: Interventional Cardiology

## 2019-09-01 ENCOUNTER — Encounter: Payer: Self-pay | Admitting: Interventional Cardiology

## 2019-09-01 VITALS — BP 100/50 | HR 71 | Ht 69.0 in | Wt 138.8 lb

## 2019-09-01 DIAGNOSIS — I2510 Atherosclerotic heart disease of native coronary artery without angina pectoris: Secondary | ICD-10-CM | POA: Diagnosis not present

## 2019-09-01 DIAGNOSIS — I10 Essential (primary) hypertension: Secondary | ICD-10-CM

## 2019-09-01 DIAGNOSIS — I5023 Acute on chronic systolic (congestive) heart failure: Secondary | ICD-10-CM | POA: Diagnosis not present

## 2019-09-01 DIAGNOSIS — I4821 Permanent atrial fibrillation: Secondary | ICD-10-CM | POA: Diagnosis not present

## 2019-09-01 DIAGNOSIS — E785 Hyperlipidemia, unspecified: Secondary | ICD-10-CM

## 2019-09-01 NOTE — Patient Instructions (Signed)
Medication Instructions:  Your physician recommends that you continue on your current medications as directed. Please refer to the Current Medication list given to you today.  If you need a refill on your cardiac medications before your next appointment, please call your pharmacy.   Lab work: None Ordered  If you have labs (blood work) drawn today and your tests are completely normal, you will receive your results only by: Marland Kitchen MyChart Message (if you have MyChart) OR . A paper copy in the mail If you have any lab test that is abnormal or we need to change your treatment, we will call you to review the results.  Testing/Procedures: Your physician has requested that you have an echocardiogram. Echocardiography is a painless test that uses sound waves to create images of your heart. It provides your doctor with information about the size and shape of your heart and how well your heart's chambers and valves are working. This procedure takes approximately one hour. There are no restrictions for this procedure.  Follow-Up: . Based on test results  Any Other Special Instructions Will Be Listed Below (If Applicable).   Echocardiogram An echocardiogram is a procedure that uses painless sound waves (ultrasound) to produce an image of the heart. Images from an echocardiogram can provide important information about:  Signs of coronary artery disease (CAD).  Aneurysm detection. An aneurysm is a weak or damaged part of an artery wall that bulges out from the normal force of blood pumping through the body.  Heart size and shape. Changes in the size or shape of the heart can be associated with certain conditions, including heart failure, aneurysm, and CAD.  Heart muscle function.  Heart valve function.  Signs of a past heart attack.  Fluid buildup around the heart.  Thickening of the heart muscle.  A tumor or infectious growth around the heart valves. Tell a health care provider about:  Any  allergies you have.  All medicines you are taking, including vitamins, herbs, eye drops, creams, and over-the-counter medicines.  Any blood disorders you have.  Any surgeries you have had.  Any medical conditions you have.  Whether you are pregnant or may be pregnant. What are the risks? Generally, this is a safe procedure. However, problems may occur, including:  Allergic reaction to dye (contrast) that may be used during the procedure. What happens before the procedure? No specific preparation is needed. You may eat and drink normally. What happens during the procedure?   An IV tube may be inserted into one of your veins.  You may receive contrast through this tube. A contrast is an injection that improves the quality of the pictures from your heart.  A gel will be applied to your chest.  A wand-like tool (transducer) will be moved over your chest. The gel will help to transmit the sound waves from the transducer.  The sound waves will harmlessly bounce off of your heart to allow the heart images to be captured in real-time motion. The images will be recorded on a computer. The procedure may vary among health care providers and hospitals. What happens after the procedure?  You may return to your normal, everyday life, including diet, activities, and medicines, unless your health care provider tells you not to do that. Summary  An echocardiogram is a procedure that uses painless sound waves (ultrasound) to produce an image of the heart.  Images from an echocardiogram can provide important information about the size and shape of your heart, heart muscle  function, heart valve function, and fluid buildup around your heart.  You do not need to do anything to prepare before this procedure. You may eat and drink normally.  After the echocardiogram is completed, you may return to your normal, everyday life, unless your health care provider tells you not to do that. This  information is not intended to replace advice given to you by your health care provider. Make sure you discuss any questions you have with your health care provider. Document Released: 10/02/2000 Document Revised: 01/26/2019 Document Reviewed: 11/07/2016 Elsevier Patient Education  2020 [employer].

## 2019-09-09 ENCOUNTER — Other Ambulatory Visit: Payer: Self-pay | Admitting: Interventional Cardiology

## 2019-09-12 ENCOUNTER — Other Ambulatory Visit: Payer: Self-pay | Admitting: Interventional Cardiology

## 2019-09-13 ENCOUNTER — Ambulatory Visit (HOSPITAL_COMMUNITY): Payer: Medicare Other | Attending: Cardiovascular Disease

## 2019-09-13 ENCOUNTER — Other Ambulatory Visit: Payer: Self-pay

## 2019-09-13 DIAGNOSIS — I5023 Acute on chronic systolic (congestive) heart failure: Secondary | ICD-10-CM | POA: Diagnosis present

## 2019-10-06 ENCOUNTER — Telehealth: Payer: Self-pay | Admitting: Interventional Cardiology

## 2019-10-06 DIAGNOSIS — I5023 Acute on chronic systolic (congestive) heart failure: Secondary | ICD-10-CM

## 2019-10-06 DIAGNOSIS — I2510 Atherosclerotic heart disease of native coronary artery without angina pectoris: Secondary | ICD-10-CM

## 2019-10-06 DIAGNOSIS — Z01812 Encounter for preprocedural laboratory examination: Secondary | ICD-10-CM

## 2019-10-06 NOTE — Telephone Encounter (Signed)
I spoke to the patient's daughter [name]) [phone] who said that she would like to speak with [place] about heart cath discussed at last visit.  She is willing to wait until [place] returns to office to discuss.  Please call for advisement.

## 2019-10-06 NOTE — Telephone Encounter (Signed)
[name], patients daughter, is asking for Dr. Hassell Done nurse, Marye Round, to give her a call. Would not explain anything further.

## 2019-10-10 NOTE — Telephone Encounter (Signed)
Left message for Andrea to call back 

## 2019-10-10 NOTE — Telephone Encounter (Signed)
Follow up   Per the previous conversation, patient's daughter  would like a f/u call to go over some additional information.

## 2019-10-10 NOTE — Telephone Encounter (Signed)
Called Dr. Anne Fu office and left a message for him to call back. Echo results were forwarded to Dr. Wynelle Link on 12/1 stating that patient would likely need a heart cath prior to surgery and Dr. Irish Lack wanting to know if patient is a surgical candidate for his total hip replacement.   Called and let daughter [name] know that we are waiting to hear back from Dr. Wynelle Link.

## 2019-10-10 NOTE — Telephone Encounter (Signed)
Faith from Dr. Anne Fu office called and and states that the patient will need an appointment with Dr. Wynelle Link to discuss if he is a surgical candidate and that if he was that his surgery would likely be scheduled in February or later. Faith states that she will contact the patient to arrange and will let us know.   Called and spoke to patient's daughter [name] and made her aware.

## 2019-10-17 NOTE — Telephone Encounter (Signed)
Spoke with Daughter [name]. The patient has been scheduled with Dr. Wynelle Link on 11/03/19. Daughter is wanting to know if we can go ahead and schedule the heart catheterization a week or two after the appointment with Dr. Wynelle Link. We have tentatively spoken about having it done on 11/13/19. Patient will need a covid test, ekg, and updated H&P with risks and benefits done. Patient had labs done with PCP on 12/28 showing Cr- 0.9 and Hgb- 13.3 (available in [facility]). Made daughter aware that I will discuss with Dr. Irish Lack and call her back.

## 2019-10-18 NOTE — Addendum Note (Signed)
Addended by: Drue Novel I on: 10/18/2019 11:50 AM   Modules accepted: Orders

## 2019-10-18 NOTE — Telephone Encounter (Signed)
Called and spoke to daughter [name]. Patient has been scheduled for a Left and Right Heart Cath on 11/13/19 with Dr. Irish Lack. Patient will come in for nurse visit EKG and repeat labs on 11/10/19 and will have covid test done afterwards. Dr. Irish Lack is okay with patient holding eliquis 2 days prior to cath. Dr. Irish Lack will update H&P the morning of the cath. Reviewed instructions below with daughter.   [place] [facility] OFFICE [place], [place] [address] Dept: [phone] Loc: [facility].  10/18/2019  You are scheduled for a Cardiac Catheterization on Monday, January 25 with Dr. Larae Grooms.  1. Please arrive at the [facility] (Main Entrance A) at [facility]: [address] [place], [place] [address] at 5:30 AM (This time is two hours before your procedure to ensure your preparation). Free valet parking service is available.   Special note: Every effort is made to have your procedure done on time. Please understand that emergencies sometimes delay scheduled procedures.  2. Diet: Do not eat solid foods after midnight.  The patient may have clear liquids until 5am upon the day of the procedure.  3. Labs: Nurse Visit EKG and labs on 11/10/19 at 11:30 AM at [facility] COVID-19 Testing will be done on 11/10/19 at 12:30 PM at [facility] at [id number], [place], [place] [address]. Once you arrive at the testing site, stay in the right hand lane, go under the building overhang not the tent. If you are tested under the tent your results may not be back before your procedure. Please be on time for your appointment.  After your swab you will be given a mask to wear and instructed to go home and quarantine/no visitors until after your procedure. If you test positive you will be notified and your procedure will be  cancelled.    4. Medication instructions in preparation for your procedure:   Contrast Allergy: No   DO NOT take Eliquis for 2 days prior to procedure  DO NOT take furosemide (lasix) the morning of the procedure  DO NOT take spironolactone (aldactone) the morning of the procedure  On the morning of your procedure, take a baby Aspirin 81 mg and any morning medicines NOT listed above.  You may use sips of water.  5. Plan for one night stay--bring personal belongings. 6. Bring a current list of your medications and current insurance cards. 7. You MUST have a responsible person to drive you home. 8. Someone MUST be with you the first 24 hours after you arrive home or your discharge will be delayed. 9. Please wear clothes that are easy to get on and off and wear slip-on shoes.  Thank you for allowing [place] to care for you!   -- [facility]

## 2019-11-09 ENCOUNTER — Telehealth: Payer: Self-pay | Admitting: *Deleted

## 2019-11-09 NOTE — Telephone Encounter (Addendum)
Pt contacted pre-catheterization scheduled at [facility] for: Monday November 13, 2019 7:30 AM Verified arrival time and place: [facility]) at: 5:30 AM   No solid food after midnight prior to cath, clear liquids until 5 AM day of procedure. Contrast allergy: no   Hold: Eliquis-none 11/11/19 until post procedure. Lasix-AM of procedure. Spironolactone-AM of procedure.   Except hold medications AM meds can be  taken pre-cath with sip of water including: ASA 81 mg   Confirmed patient has responsible adult to drive home post procedure and observe 24 hours after arriving home: yes  Currently, due to Covid-19 pandemic, only one person will be allowed with patient. Must be the same person for patient's entire stay and will be required to wear a mask. They will be asked to wait in the waiting room for the duration of the patient's stay.  Patients are required to wear a mask when they enter the hospital.  I reviewed procedure/mask/visitor instructions with patient's daughter (DPR), [name], she verbalized understanding, thanked me for call.

## 2019-11-09 NOTE — Telephone Encounter (Signed)
Error

## 2019-11-10 ENCOUNTER — Ambulatory Visit (INDEPENDENT_AMBULATORY_CARE_PROVIDER_SITE_OTHER): Payer: Medicare Other | Admitting: Interventional Cardiology

## 2019-11-10 ENCOUNTER — Encounter: Payer: Self-pay | Admitting: Interventional Cardiology

## 2019-11-10 ENCOUNTER — Other Ambulatory Visit: Payer: Medicare Other | Admitting: *Deleted

## 2019-11-10 ENCOUNTER — Other Ambulatory Visit (HOSPITAL_COMMUNITY)
Admission: RE | Admit: 2019-11-10 | Discharge: 2019-11-10 | Disposition: A | Discharge status: Home / Self Care | Payer: Medicare Other | Source: Ambulatory Visit | Attending: Interventional Cardiology | Admitting: Interventional Cardiology

## 2019-11-10 ENCOUNTER — Other Ambulatory Visit: Payer: Self-pay

## 2019-11-10 ENCOUNTER — Ambulatory Visit: Payer: Medicare Other

## 2019-11-10 VITALS — BP 122/68 | HR 54 | Ht 69.0 in | Wt 137.4 lb

## 2019-11-10 DIAGNOSIS — I5023 Acute on chronic systolic (congestive) heart failure: Secondary | ICD-10-CM

## 2019-11-10 DIAGNOSIS — I447 Left bundle-branch block, unspecified: Secondary | ICD-10-CM

## 2019-11-10 DIAGNOSIS — Z01812 Encounter for preprocedural laboratory examination: Secondary | ICD-10-CM | POA: Insufficient documentation

## 2019-11-10 DIAGNOSIS — Z20822 Contact with and (suspected) exposure to covid-19: Secondary | ICD-10-CM | POA: Diagnosis not present

## 2019-11-10 DIAGNOSIS — I4821 Permanent atrial fibrillation: Secondary | ICD-10-CM

## 2019-11-10 DIAGNOSIS — I2510 Atherosclerotic heart disease of native coronary artery without angina pectoris: Secondary | ICD-10-CM

## 2019-11-10 LAB — SARS CORONAVIRUS 2 (TAT 6-24 HRS): SARS Coronavirus 2: NEGATIVE

## 2019-11-10 NOTE — Patient Instructions (Addendum)
Keep plan for labs and cardiac Cath on 11/13/19. Follow up with Dr. Irish Lack on 11/30/19 at 1:20 PM  [facility] OFFICE [place], [place] [address] Dept: [phone] Loc: [phone]  Parker Odonnell.                   10/18/2019  You are scheduled for a Cardiac Catheterization on Monday, January 25 with Dr. Larae Grooms.  1. Please arrive at the [facility] (Main Entrance A) at [facility]: [address] [place], [place] [address] at 5:30 AM (This time is two hours before your procedure to ensure your preparation). Free valet parking service is available.   Special note: Every effort is made to have your procedure done on time. Please understand that emergencies sometimes delay scheduled procedures.  2. Diet: Do not eat solid foods after midnight.  The patient may have clear liquids until 5am upon the day of the procedure.  3. Labs: TODAY  Your Pre-procedure COVID-19 Testing will be done on TODAY at 12:30 PM at [facility] at [id number], [place],  [address]. Once you arrive at the testing site, stay in the right hand lane, go under the building overhang not the tent. If you are tested under the tent your results may not be back before your procedure. Please be on time for your appointment.  After your swab you will be given a mask to wear and instructed to go home and quarantine/no visitors until after your procedure. If you test positive you will be notified and your procedure will be cancelled.    4. Medication instructions in preparation for your procedure:   Contrast Allergy: No   DO NOT take Eliquis for 2 days prior to procedure  DO NOT take furosemide (lasix) the morning of the procedure  DO NOT take spironolactone (aldactone) the morning of the procedure  On the morning of your procedure, take a baby  Aspirin 81 mg and any morning medicines NOT listed above.  You may use sips of water.  5. Plan for one night stay--bring personal belongings. 6. Bring a current list of your medications and current insurance cards. 7. You MUST have a responsible person to drive you home. 8. Someone MUST be with you the first 24 hours after you arrive home or your discharge will be delayed. 9. Please wear clothes that are easy to get on and off and wear slip-on shoes.  Thank you for allowing [place] to care for you!   -- [facility]

## 2019-11-10 NOTE — Progress Notes (Signed)
Cardiology Office Note   Date:  11/10/2019   ID:  Parker Izer., DOB 1932/03/19, MRN [id number]  PCP:  Lawerance Cruel, MD    No chief complaint on file.  CAD/ Chronic systolic heart failure  Wt Readings from Last 3 Encounters:  11/10/19 137 lb 6.4 oz (62.3 kg)  09/01/19 138 lb 12.8 oz (63 kg)  07/14/18 132 lb 12.8 oz (60.2 kg)       History of Present Illness: Parker Odonnell. is a 86 y.o. male  who had an LAD stent in 1997.  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 He now lives in an apartment after his wife passed away a few years ago.   Hernia surgery in 2013 went well. No cardiac issues at that time. No ischemia by stress test at that time.  He was noted to have an irregular heart beat in early 2019 with his PMD. He was referred here to evaluate.  In March 2019, echo:  Left ventricle: The cavity size was normal. Wall thickness was increased in a pattern of mild LVH. There was mild concentric hypertrophy. Systolic function was moderately reduced. The estimated ejection fraction was in the range of 35% to 40%. Moderate diffuse hypokinesis with regional variations. There appears to be disproportionate hypokinesis of the apex and the inferolateral wall. ; in the distribution of multiple vessels. No evidence of thrombus. - Ventricular septum: Septal motion showed abnormal function, dyssynergy, and paradox. - Mitral valve: There was mild regurgitation. - Left atrium: The atrium was moderately to severely dilated. - Right ventricle: The cavity size was mildly dilated. - Right atrium: The atrium was moderately to severely dilated. - Atrial septum: No defect or patent foramen ovale was identified. - Tricuspid valve: There was mild-moderate regurgitation directed centrally. - Pulmonary arteries: PA peak pressure: 42 mm Hg (S).  He was hospitalized in 2019 for CHF: "Acute systolic Heart  failure exacerbation Last EF 35-40%, cardiology consulted and followed patient. He was initially maintained on IV Lasix, fluid status improved and was converted to po. He was also started on Spironolactone, and his HCTZ was discontinued.Repeat ECHO. Lower EF 25--30 %. He was net negative 7 L, dyspnea has resolved and patient returned to baseline."  Eliquis dose was decreased as well. EF was 25-30%.  At the visit in 8/19 we discussed the following: "Known LAD stent in 1997. Ejection fraction was normal in 2004. Now, given decreased ejection fraction and heart failure, would need to consider left and right heart cath. This was discussed with the patient and his daughter. They were also considering hip replacement for the patient. Prior to clearing him for hip replacement, I think he would need a heart cath. They will consider this and get back to [place] with a decision.."  "Spoke about w/u and need for surgery. Hip sx are fairly well controlled. Would normally consider cath, especially if he was going to have surgery. However, he is feeling well. His hip is better after diuresing 15 lbs in 8/19. We discussed that perhaps conservative management is the right treatment path rather than surgical management. Will forward to Dr. Alvan Dame for his opinion. THe patient's surgical risk with a low EF is now hihger than it was in the past. Family member was present for discussion as well. She is in agreement with the plan of conservative management due to his age and other comorbidities. If symptoms change,  we can change our strategy. "  In late 2020, hip pain worsened.  He is hoping for total hip replacement. He is here to plan for cath.   Denies : Chest pain. Dizziness. Leg edema. Nitroglycerin use. Orthopnea. Palpitations. Paroxysmal nocturnal dyspnea. Shortness of breath. Syncope.     Past Medical History:  Diagnosis Date  . ASCVD (arteriosclerotic cardiovascular disease)    single vessel   . Coronary atherosclerosis of native coronary artery   . Dyslipidemia   . ED (erectile dysfunction)   . Hypertension   . Inguinal hernia   . Left bundle branch block    chronic  . Postsurgical percutaneous transluminal coronary angioplasty status     Past Surgical History:  Procedure Laterality Date  . CARDIAC CATHETERIZATION  1998  . CARDIOVASCULAR STRESS TEST  05/25/12  . CHOLECYSTECTOMY  1979  . [place]  . INGUINAL HERNIA REPAIR  06/15/2012   Procedure: HERNIA REPAIR INGUINAL ADULT;  Surgeon: Adin Hector, MD;  Location: [facility];  Service: General;  Laterality: Bilateral;  Bilateral Inguinal Hernia Repair with Ultrapro Mesh     Current Outpatient Medications  Medication Sig Dispense Refill  . apixaban (ELIQUIS) 2.5 MG TABS tablet Take 1 tablet (2.5 mg total) by mouth 2 (two) times daily. 60 tablet 0  . atorvastatin (LIPITOR) 40 MG tablet Take 1 tablet (40 mg total) by mouth daily at 6 PM. 90 tablet 0  . b complex vitamins tablet Take 1 tablet by mouth daily.    . carvedilol (COREG) 3.125 MG tablet TAKE 1 TABLET (3.125 MG TOTAL) BY MOUTH 2 (TWO) TIMES DAILY WITH A MEAL. 60 tablet 11  . furosemide (LASIX) 20 MG tablet Take 1 tablet (20 mg total) by mouth daily. 30 tablet 0  . lisinopril (PRINIVIL,ZESTRIL) 20 MG tablet Take 1 tablet (20 mg total) by mouth daily. 30 tablet 0  . Multiple Vitamin (MULTIVITAMIN WITH MINERALS) TABS tablet Take 1 tablet by mouth every other day. [facility]  Spectravite Multivitamin    . Multiple Vitamins-Minerals (OCUVITE-LUTEIN PO) Take 1 capsule by mouth daily.    Vladimir Faster Glycol-Propyl Glycol (SYSTANE) 0.4-0.3 % SOLN Apply 1 drop to eye 2 (two) times daily as needed (dry eyes).     Marland Kitchen spironolactone (ALDACTONE) 25 MG tablet Take 0.5 tablets (12.5 mg total) by mouth daily. 30 tablet 0  . traMADol (ULTRAM) 50 MG tablet Take 100 mg by mouth daily.     No current facility-administered medications for this visit.    Allergies:    Patient has no known allergies.    Social History:  The patient  reports that he has never smoked. He has never used smokeless tobacco. He reports that he does not drink alcohol or use drugs.   Family History:  The patient's family history includes Arthritis in his father; Hypertension in his paternal uncle.    ROS:  Please see the history of present illness.   Otherwise, review of systems are positive for left hip pain.   All other systems are reviewed and negative.    PHYSICAL EXAM: VS:  BP 122/68   Pulse (!) 54   Ht 5\' 9"  (1.753 m)   Wt 137 lb 6.4 oz (62.3 kg)   SpO2 98%   BMI 20.29 kg/m  , BMI Body mass index is 20.29 kg/m. GEN: Well nourished, well developed, in no acute distress  HEENT: normal  Neck: no JVD, carotid bruits, or masses Cardiac: RRR; no murmurs, rubs, or gallops,no  edema  Respiratory:  clear to auscultation bilaterally, normal work of breathing GI: soft, nontender, nondistended, + BS MS: no deformity or atrophy  Skin: warm and dry, no rash Neuro:  Slow gait Psych: euthymic mood, full affect   EKG:   The ekg ordered today demonstrates NSR, LBBB, blocked PACs   Recent Labs: No results found for requested labs within last 8760 hours.   Lipid Panel    Component Value Date/Time   CHOL 104 12/23/2016 0804   CHOL 110 01/04/2014 0822   TRIG 62 12/23/2016 0804   TRIG 72 01/04/2014 0822   HDL 47 12/23/2016 0804   HDL 40 01/04/2014 0822   CHOLHDL 2.2 12/23/2016 0804   CHOLHDL 2.4 12/19/2015 0825   VLDL 11 12/19/2015 0825   LDLCALC 45 12/23/2016 0804   LDLCALC 56 01/04/2014 0822     Other studies Reviewed: Additional studies/ records that were reviewed today with results demonstrating: ECG; labs pending.   ASSESSMENT AND PLAN:  CAD: Plan for cath. Cardiac catheterization was discussed with the patient fully. The patient understands that risks include but are not limited to stroke (1 in 1000), death (1 in 47), kidney failure [usually temporary] (1  in 500), bleeding (1 in 200), allergic reaction [possibly serious] (1 in 200).  The patient understands and is willing to proceed.    Chronic systolic heart failure: 1. AFib: Holding Eliquis for cath.  Start aspirin over the weekend. Blocked PACs on ECG.  If bradycardia persists, may need to stop carvedilol.  2. Hyperlipidemia: LDL 50. COntinue statin. 3. LBBB: Chronic. Care with right heart cath.    Current medicines are reviewed at length with the patient today.  The patient concerns regarding his medicines were addressed.  The following changes have been made:  No change  Labs/ tests ordered today include: pre cath labs today.  Orders Placed This Encounter  Procedures  . EKG 12-Lead    Recommend 150 minutes/week of aerobic exercise Low fat, low carb, high fiber diet recommended  Disposition:   FU post cath   Signed, Larae Grooms, MD  11/10/2019 12:53 PM    [facility] [place], [place], [place]  [address] Phone: [phone]; Fax: [phone]

## 2019-11-10 NOTE — H&P (View-Only) (Signed)
Cardiology Office Note   Date:  11/10/2019   ID:  Parker Odonnell., DOB 1932/03/03, MRN [id number]  PCP:  Lawerance Cruel, MD    No chief complaint on file.  CAD/ Chronic systolic heart failure  Wt Readings from Last 3 Encounters:  11/10/19 137 lb 6.4 oz (62.3 kg)  09/01/19 138 lb 12.8 oz (63 kg)  07/14/18 132 lb 12.8 oz (60.2 kg)       History of Present Illness: Parker Odonnell. is a 86 y.o. male  who had an LAD stent in 1997.  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 He now lives in an apartment after his wife passed away a few years ago.   Hernia surgery in 2013 went well. No cardiac issues at that time. No ischemia by stress test at that time.  He was noted to have an irregular heart beat in early 2019 with his PMD. He was referred here to evaluate.  In March 2019, echo:  Left ventricle: The cavity size was normal. Wall thickness was increased in a pattern of mild LVH. There was mild concentric hypertrophy. Systolic function was moderately reduced. The estimated ejection fraction was in the range of 35% to 40%. Moderate diffuse hypokinesis with regional variations. There appears to be disproportionate hypokinesis of the apex and the inferolateral wall. ; in the distribution of multiple vessels. No evidence of thrombus. - Ventricular septum: Septal motion showed abnormal function, dyssynergy, and paradox. - Mitral valve: There was mild regurgitation. - Left atrium: The atrium was moderately to severely dilated. - Right ventricle: The cavity size was mildly dilated. - Right atrium: The atrium was moderately to severely dilated. - Atrial septum: No defect or patent foramen ovale was identified. - Tricuspid valve: There was mild-moderate regurgitation directed centrally. - Pulmonary arteries: PA peak pressure: 42 mm Hg (S).  He was hospitalized in 2019 for CHF: "Acute systolic Heart  failure exacerbation Last EF 35-40%, cardiology consulted and followed patient. He was initially maintained on IV Lasix, fluid status improved and was converted to po. He was also started on Spironolactone, and his HCTZ was discontinued.Repeat ECHO. Lower EF 25--30 %. He was net negative 7 L, dyspnea has resolved and patient returned to baseline."  Eliquis dose was decreased as well. EF was 25-30%.  At the visit in 8/19 we discussed the following: "Known LAD stent in 1997. Ejection fraction was normal in 2004. Now, given decreased ejection fraction and heart failure, would need to consider left and right heart cath. This was discussed with the patient and his daughter. They were also considering hip replacement for the patient. Prior to clearing him for hip replacement, I think he would need a heart cath. They will consider this and get back to [place] with a decision.."  "Spoke about w/u and need for surgery. Hip sx are fairly well controlled. Would normally consider cath, especially if he was going to have surgery. However, he is feeling well. His hip is better after diuresing 15 lbs in 8/19. We discussed that perhaps conservative management is the right treatment path rather than surgical management. Will forward to Dr. Alvan Dame for his opinion. THe patient's surgical risk with a low EF is now hihger than it was in the past. Family member was present for discussion as well. She is in agreement with the plan of conservative management due to his age and other comorbidities. If symptoms change,  we can change our strategy. "  In late 2020, hip pain worsened.  He is hoping for total hip replacement. He is here to plan for cath.   Denies : Chest pain. Dizziness. Leg edema. Nitroglycerin use. Orthopnea. Palpitations. Paroxysmal nocturnal dyspnea. Shortness of breath. Syncope.     Past Medical History:  Diagnosis Date  . ASCVD (arteriosclerotic cardiovascular disease)    single vessel    . Coronary atherosclerosis of native coronary artery   . Dyslipidemia   . ED (erectile dysfunction)   . Hypertension   . Inguinal hernia   . Left bundle branch block    chronic  . Postsurgical percutaneous transluminal coronary angioplasty status     Past Surgical History:  Procedure Laterality Date  . CARDIAC CATHETERIZATION  1998  . CARDIOVASCULAR STRESS TEST  05/25/12  . CHOLECYSTECTOMY  1979  . [place]  . INGUINAL HERNIA REPAIR  06/15/2012   Procedure: HERNIA REPAIR INGUINAL ADULT;  Surgeon: Adin Hector, MD;  Location: [facility];  Service: General;  Laterality: Bilateral;  Bilateral Inguinal Hernia Repair with Ultrapro Mesh     Current Outpatient Medications  Medication Sig Dispense Refill  . apixaban (ELIQUIS) 2.5 MG TABS tablet Take 1 tablet (2.5 mg total) by mouth 2 (two) times daily. 60 tablet 0  . atorvastatin (LIPITOR) 40 MG tablet Take 1 tablet (40 mg total) by mouth daily at 6 PM. 90 tablet 0  . b complex vitamins tablet Take 1 tablet by mouth daily.    . carvedilol (COREG) 3.125 MG tablet TAKE 1 TABLET (3.125 MG TOTAL) BY MOUTH 2 (TWO) TIMES DAILY WITH A MEAL. 60 tablet 11  . furosemide (LASIX) 20 MG tablet Take 1 tablet (20 mg total) by mouth daily. 30 tablet 0  . lisinopril (PRINIVIL,ZESTRIL) 20 MG tablet Take 1 tablet (20 mg total) by mouth daily. 30 tablet 0  . Multiple Vitamin (MULTIVITAMIN WITH MINERALS) TABS tablet Take 1 tablet by mouth every other day. [facility]  Spectravite Multivitamin    . Multiple Vitamins-Minerals (OCUVITE-LUTEIN PO) Take 1 capsule by mouth daily.    Vladimir Faster Glycol-Propyl Glycol (SYSTANE) 0.4-0.3 % SOLN Apply 1 drop to eye 2 (two) times daily as needed (dry eyes).     Marland Kitchen spironolactone (ALDACTONE) 25 MG tablet Take 0.5 tablets (12.5 mg total) by mouth daily. 30 tablet 0  . traMADol (ULTRAM) 50 MG tablet Take 100 mg by mouth daily.     No current facility-administered medications for this visit.    Allergies:    Patient has no known allergies.    Social History:  The patient  reports that he has never smoked. He has never used smokeless tobacco. He reports that he does not drink alcohol or use drugs.   Family History:  The patient's family history includes Arthritis in his father; Hypertension in his paternal uncle.    ROS:  Please see the history of present illness.   Otherwise, review of systems are positive for left hip pain.   All other systems are reviewed and negative.    PHYSICAL EXAM: VS:  BP 122/68   Pulse (!) 54   Ht 5\' 9"  (1.753 m)   Wt 137 lb 6.4 oz (62.3 kg)   SpO2 98%   BMI 20.29 kg/m  , BMI Body mass index is 20.29 kg/m. GEN: Well nourished, well developed, in no acute distress  HEENT: normal  Neck: no JVD, carotid bruits, or masses Cardiac: RRR; no murmurs, rubs, or  gallops,no edema  Respiratory:  clear to auscultation bilaterally, normal work of breathing GI: soft, nontender, nondistended, + BS MS: no deformity or atrophy  Skin: warm and dry, no rash Neuro:  Slow gait Psych: euthymic mood, full affect   EKG:   The ekg ordered today demonstrates NSR, LBBB, blocked PACs   Recent Labs: No results found for requested labs within last 8760 hours.   Lipid Panel    Component Value Date/Time   CHOL 104 12/23/2016 0804   CHOL 110 01/04/2014 0822   TRIG 62 12/23/2016 0804   TRIG 72 01/04/2014 0822   HDL 47 12/23/2016 0804   HDL 40 01/04/2014 0822   CHOLHDL 2.2 12/23/2016 0804   CHOLHDL 2.4 12/19/2015 0825   VLDL 11 12/19/2015 0825   LDLCALC 45 12/23/2016 0804   LDLCALC 56 01/04/2014 0822     Other studies Reviewed: Additional studies/ records that were reviewed today with results demonstrating: ECG; labs pending.   ASSESSMENT AND PLAN:  CAD: Plan for cath. Cardiac catheterization was discussed with the patient fully. The patient understands that risks include but are not limited to stroke (1 in 1000), death (1 in 46), kidney failure [usually temporary] (1  in 500), bleeding (1 in 200), allergic reaction [possibly serious] (1 in 200).  The patient understands and is willing to proceed.    Chronic systolic heart failure: 1. AFib: Holding Eliquis for cath.  Start aspirin over the weekend. Blocked PACs on ECG.  If bradycardia persists, may need to stop carvedilol.  2. Hyperlipidemia: LDL 50. COntinue statin. 3. LBBB: Chronic. Care with right heart cath.    Current medicines are reviewed at length with the patient today.  The patient concerns regarding his medicines were addressed.  The following changes have been made:  No change  Labs/ tests ordered today include: pre cath labs today.  Orders Placed This Encounter  Procedures  . EKG 12-Lead    Recommend 150 minutes/week of aerobic exercise Low fat, low carb, high fiber diet recommended  Disposition:   FU post cath   Signed, Larae Grooms, MD  11/10/2019 12:53 PM    [facility] [place], [place], [place]  [address] Phone: [phone]; Fax: [phone]

## 2019-11-11 LAB — CBC
Hematocrit: 38.9 % (ref 37.5–51.0)
Hemoglobin: 13 g/dL (ref 13.0–17.7)
MCH: 33.9 pg — ABNORMAL HIGH (ref 26.6–33.0)
MCHC: 33.4 g/dL (ref 31.5–35.7)
MCV: 102 fL — ABNORMAL HIGH (ref 79–97)
Platelets: 142 10*3/uL — ABNORMAL LOW (ref 150–450)
RBC: 3.83 x10E6/uL — ABNORMAL LOW (ref 4.14–5.80)
RDW: 12.1 % (ref 11.6–15.4)
WBC: 6 10*3/uL (ref 3.4–10.8)

## 2019-11-11 LAB — BASIC METABOLIC PANEL
BUN/Creatinine Ratio: 12 (ref 10–24)
BUN: 12 mg/dL (ref 8–27)
CO2: 23 mmol/L (ref 20–29)
Calcium: 9.7 mg/dL (ref 8.6–10.2)
Chloride: 103 mmol/L (ref 96–106)
Creatinine, Ser: 1.02 mg/dL (ref 0.76–1.27)
GFR calc Af Amer: 77 mL/min/{1.73_m2} (ref >59)
GFR calc non Af Amer: 67 mL/min/{1.73_m2} (ref >59)
Glucose: 102 mg/dL — ABNORMAL HIGH (ref 65–99)
Potassium: 4.5 mmol/L (ref 3.5–5.2)
Sodium: 139 mmol/L (ref 134–144)

## 2019-11-13 ENCOUNTER — Ambulatory Visit (HOSPITAL_COMMUNITY)
Admission: RE | Admit: 2019-11-13 | Discharge: 2019-11-13 | Disposition: A | Discharge status: Home / Self Care | Payer: Medicare Other | Attending: Interventional Cardiology | Admitting: Interventional Cardiology

## 2019-11-13 ENCOUNTER — Encounter (HOSPITAL_COMMUNITY)
Admission: RE | Disposition: A | Discharge status: Home / Self Care | Payer: Self-pay | Source: Home / Self Care | Attending: Interventional Cardiology

## 2019-11-13 ENCOUNTER — Other Ambulatory Visit: Payer: Self-pay

## 2019-11-13 DIAGNOSIS — Z01810 Encounter for preprocedural cardiovascular examination: Secondary | ICD-10-CM

## 2019-11-13 DIAGNOSIS — I428 Other cardiomyopathies: Secondary | ICD-10-CM | POA: Insufficient documentation

## 2019-11-13 DIAGNOSIS — E785 Hyperlipidemia, unspecified: Secondary | ICD-10-CM | POA: Insufficient documentation

## 2019-11-13 DIAGNOSIS — I2510 Atherosclerotic heart disease of native coronary artery without angina pectoris: Secondary | ICD-10-CM | POA: Diagnosis not present

## 2019-11-13 DIAGNOSIS — I447 Left bundle-branch block, unspecified: Secondary | ICD-10-CM | POA: Insufficient documentation

## 2019-11-13 DIAGNOSIS — I4891 Unspecified atrial fibrillation: Secondary | ICD-10-CM | POA: Diagnosis not present

## 2019-11-13 DIAGNOSIS — Z79899 Other long term (current) drug therapy: Secondary | ICD-10-CM | POA: Insufficient documentation

## 2019-11-13 DIAGNOSIS — I5022 Chronic systolic (congestive) heart failure: Secondary | ICD-10-CM | POA: Insufficient documentation

## 2019-11-13 DIAGNOSIS — Z7901 Long term (current) use of anticoagulants: Secondary | ICD-10-CM | POA: Diagnosis not present

## 2019-11-13 DIAGNOSIS — Z955 Presence of coronary angioplasty implant and graft: Secondary | ICD-10-CM | POA: Insufficient documentation

## 2019-11-13 DIAGNOSIS — I110 Hypertensive heart disease with heart failure: Secondary | ICD-10-CM | POA: Diagnosis not present

## 2019-11-13 HISTORY — PX: RIGHT/LEFT HEART CATH AND CORONARY ANGIOGRAPHY: CATH118266

## 2019-11-13 LAB — POCT I-STAT EG7
Acid-Base Excess: 1 mmol/L (ref 0.0–2.0)
Bicarbonate: 24.4 mmol/L (ref 20.0–28.0)
Bicarbonate: 22.8 mmol/L (ref 20.0–28.0)
Calcium, Ion: 1.24 mmol/L (ref 1.15–1.40)
Calcium, Ion: 1.29 mmol/L (ref 1.15–1.40)
HCT: 34 % — ABNORMAL LOW (ref 39.0–52.0)
HCT: 34 % — ABNORMAL LOW (ref 39.0–52.0)
Hemoglobin: 11.6 g/dL — ABNORMAL LOW (ref 13.0–17.0)
Hemoglobin: 11.6 g/dL — ABNORMAL LOW (ref 13.0–17.0)
O2 Saturation: 76 %
O2 Saturation: 98 %
Potassium: 3.9 mmol/L (ref 3.5–5.1)
Potassium: 3.9 mmol/L (ref 3.5–5.1)
Sodium: 140 mmol/L (ref 135–145)
Sodium: 139 mmol/L (ref 135–145)
TCO2: 25 mmol/L (ref 22–32)
TCO2: 24 mmol/L (ref 22–32)
pCO2, Ven: 35.1 mmHg — ABNORMAL LOW (ref 44.0–60.0)
pCO2, Ven: 30.6 mmHg — ABNORMAL LOW (ref 44.0–60.0)
pH, Ven: 7.45 — ABNORMAL HIGH (ref 7.250–7.430)
pH, Ven: 7.48 — ABNORMAL HIGH (ref 7.250–7.430)
pO2, Ven: 39 mmHg (ref 32.0–45.0)
pO2, Ven: 91 mmHg — ABNORMAL HIGH (ref 32.0–45.0)

## 2019-11-13 SURGERY — RIGHT/LEFT HEART CATH AND CORONARY ANGIOGRAPHY
Anesthesia: LOCAL

## 2019-11-13 MED ORDER — FENTANYL CITRATE (PF) 100 MCG/2ML IJ SOLN
INTRAMUSCULAR | Status: AC
  Filled 2019-11-13: qty 2

## 2019-11-13 MED ORDER — SODIUM CHLORIDE 0.9% FLUSH: 3.0000 mL | INTRAVENOUS | Status: DC | PRN

## 2019-11-13 MED ORDER — IOHEXOL 350 MG/ML SOLN
INTRAVENOUS | Status: DC | PRN
  Administered 2019-11-13: 30 mL via INTRACARDIAC

## 2019-11-13 MED ORDER — HYDRALAZINE HCL 20 MG/ML IJ SOLN: 10.0000 mg | INTRAMUSCULAR | Status: DC | PRN

## 2019-11-13 MED ORDER — VERAPAMIL HCL 2.5 MG/ML IV SOLN
INTRAVENOUS | Status: AC
  Filled 2019-11-13: qty 2

## 2019-11-13 MED ORDER — LABETALOL HCL 5 MG/ML IV SOLN: 10.0000 mg | INTRAVENOUS | Status: DC | PRN

## 2019-11-13 MED ORDER — ONDANSETRON HCL 4 MG/2ML IJ SOLN: 4.0000 mg | Freq: Four times a day (QID) | INTRAMUSCULAR | Status: DC | PRN

## 2019-11-13 MED ORDER — APIXABAN 2.5 MG PO TABS: 2.5000 mg | ORAL_TABLET | Freq: Two times a day (BID) | ORAL | 0 refills | Status: DC

## 2019-11-13 MED ORDER — ASPIRIN 81 MG PO CHEW: 81.0000 mg | CHEWABLE_TABLET | ORAL | Status: DC

## 2019-11-13 MED ORDER — FENTANYL CITRATE (PF) 100 MCG/2ML IJ SOLN
INTRAMUSCULAR | Status: DC | PRN
  Administered 2019-11-13 (×2): 25 ug via INTRAVENOUS

## 2019-11-13 MED ORDER — HEPARIN SODIUM (PORCINE) 1000 UNIT/ML IJ SOLN
INTRAMUSCULAR | Status: DC | PRN
  Administered 2019-11-13: 3000 [IU] via INTRAVENOUS

## 2019-11-13 MED ORDER — HEPARIN (PORCINE) IN NACL 1000-0.9 UT/500ML-% IV SOLN
INTRAVENOUS | Status: DC | PRN
  Administered 2019-11-13 (×2): 500 mL

## 2019-11-13 MED ORDER — HEPARIN (PORCINE) IN NACL 1000-0.9 UT/500ML-% IV SOLN
INTRAVENOUS | Status: AC
  Filled 2019-11-13: qty 1000

## 2019-11-13 MED ORDER — SODIUM CHLORIDE 0.9 % IV SOLN: 250.0000 mL | INTRAVENOUS | Status: DC | PRN

## 2019-11-13 MED ORDER — MIDAZOLAM HCL 2 MG/2ML IJ SOLN
INTRAMUSCULAR | Status: AC
  Filled 2019-11-13: qty 2

## 2019-11-13 MED ORDER — SODIUM CHLORIDE 0.9% FLUSH: 3.0000 mL | Freq: Two times a day (BID) | INTRAVENOUS | Status: DC

## 2019-11-13 MED ORDER — SODIUM CHLORIDE 0.9 % IV SOLN: INTRAVENOUS | Status: DC

## 2019-11-13 MED ORDER — MIDAZOLAM HCL 2 MG/2ML IJ SOLN
INTRAMUSCULAR | Status: DC | PRN
  Administered 2019-11-13: 1 mg via INTRAVENOUS

## 2019-11-13 MED ORDER — VERAPAMIL HCL 2.5 MG/ML IV SOLN
INTRAVENOUS | Status: DC | PRN
  Administered 2019-11-13: 10 mL via INTRA_ARTERIAL

## 2019-11-13 MED ORDER — ACETAMINOPHEN 325 MG PO TABS: 650.0000 mg | ORAL_TABLET | ORAL | Status: DC | PRN

## 2019-11-13 MED ORDER — LIDOCAINE HCL (PF) 1 % IJ SOLN
INTRAMUSCULAR | Status: AC
  Filled 2019-11-13: qty 30

## 2019-11-13 MED ORDER — HEPARIN SODIUM (PORCINE) 1000 UNIT/ML IJ SOLN
INTRAMUSCULAR | Status: AC
  Filled 2019-11-13: qty 1

## 2019-11-13 MED ORDER — SODIUM CHLORIDE 0.9 % IV SOLN: INTRAVENOUS | Status: AC

## 2019-11-13 MED ORDER — LIDOCAINE HCL (PF) 1 % IJ SOLN
INTRAMUSCULAR | Status: DC | PRN
  Administered 2019-11-13 (×2): 2 mL via INTRADERMAL

## 2019-11-13 SURGICAL SUPPLY — 12 items
CATH 5FR JL3.5 JR4 ANG PIG MP (CATHETERS) ×2 IMPLANT
CATH BALLN WEDGE 5F 110CM (CATHETERS) ×2 IMPLANT
DEVICE RAD COMP TR BAND LRG (VASCULAR PRODUCTS) ×2 IMPLANT
GLIDESHEATH SLEND SS 6F .021 (SHEATH) ×2 IMPLANT
GUIDEWIRE INQWIRE 1.5J.035X260 (WIRE) ×1 IMPLANT
INQWIRE 1.5J .035X260CM (WIRE) ×2
KIT HEART LEFT (KITS) ×2 IMPLANT
PACK CARDIAC CATHETERIZATION (CUSTOM PROCEDURE TRAY) ×2 IMPLANT
SHEATH GLIDE SLENDER 4/5FR (SHEATH) ×2 IMPLANT
SHEATH PROBE COVER 6X72 (BAG) ×2 IMPLANT
TRANSDUCER W/STOPCOCK (MISCELLANEOUS) ×2 IMPLANT
TUBING CIL FLEX 10 FLL-RA (TUBING) ×2 IMPLANT

## 2019-11-13 NOTE — Interval H&P Note (Signed)
Cath Lab Visit (complete for each Cath Lab visit)  Clinical Evaluation Leading to the Procedure:   ACS: No.  Non-ACS:    Anginal Classification: CCS II  Anti-ischemic medical therapy: Minimal Therapy (1 class of medications)  Non-Invasive Test Results: High-risk stress test findings: cardiac mortality >3%/year low EF  Prior CABG: No previous CABG      History and Physical Interval Note:  11/13/2019 7:38 AM  [place].  has presented today for surgery, with the diagnosis of cad - heart failure.  The various methods of treatment have been discussed with the patient and family. After consideration of risks, benefits and other options for treatment, the patient has consented to  Procedure(s): RIGHT/LEFT HEART CATH AND CORONARY ANGIOGRAPHY (N/A) as a surgical intervention.  The patient's history has been reviewed, patient examined, no change in status, stable for surgery.  I have reviewed the patient's chart and labs.  Questions were answered to the patient's satisfaction.     Larae Grooms

## 2019-11-13 NOTE — Discharge Instructions (Signed)
Radial Site Care  This sheet gives you information about how to care for yourself after your procedure. Your health care provider may also give you more specific instructions. If you have problems or questions, contact your health care provider. What can I expect after the procedure? After the procedure, it is common to have:  Bruising and tenderness at the catheter insertion area. Follow these instructions at home: Medicines  Take over-the-counter and prescription medicines only as told by your health care provider. Insertion site care  Follow instructions from your health care provider about how to take care of your insertion site. Make sure you: ? Wash your hands with soap and water before you change your bandage (dressing). If soap and water are not available, use hand sanitizer. ? Change your dressing as told by your health care provider. ? Leave stitches (sutures), skin glue, or adhesive strips in place. These skin closures may need to stay in place for 2 weeks or longer. If adhesive strip edges start to loosen and curl up, you may trim the loose edges. Do not remove adhesive strips completely unless your health care provider tells you to do that.  Check your insertion site every day for signs of infection. Check for: ? Redness, swelling, or pain. ? Fluid or blood. ? Pus or a bad smell. ? Warmth.  Do not take baths, swim, or use a hot tub until your health care provider approves.  You may shower 24-48 hours after the procedure, or as directed by your health care provider. ? Remove the dressing and gently wash the site with plain soap and water. ? Pat the area dry with a clean towel. ? Do not rub the site. That could cause bleeding.  Do not apply powder or lotion to the site. Activity   For 24 hours after the procedure, or as directed by your health care provider: ? Do not flex or bend the affected arm. ? Do not push or pull heavy objects with the affected arm. ? Do not  drive yourself home from the hospital or clinic. You may drive 24 hours after the procedure unless your health care provider tells you not to. ? Do not operate machinery or power tools.  Do not lift anything that is heavier than 10 lb (4.5 kg), or the limit that you are told, until your health care provider says that it is safe.  Ask your health care provider when it is okay to: ? Return to work or school. ? Resume usual physical activities or sports. ? Resume sexual activity. General instructions  If the catheter site starts to bleed, raise your arm and put firm pressure on the site. If the bleeding does not stop, get help right away. This is a medical emergency.  If you went home on the same day as your procedure, a responsible adult should be with you for the first 24 hours after you arrive home.  Keep all follow-up visits as told by your health care provider. This is important. Contact a health care provider if:  You have a fever.  You have redness, swelling, or yellow drainage around your insertion site. Get help right away if:  You have unusual pain at the radial site.  The catheter insertion area swells very fast.  The insertion area is bleeding, and the bleeding does not stop when you hold steady pressure on the area.  Your arm or hand becomes pale, cool, tingly, or numb. These symptoms may represent a serious problem   that is an emergency. Do not wait to see if the symptoms will go away. Get medical help right away. Call your local emergency services (911 in the [place] Do not drive yourself to the hospital. Summary  After the procedure, it is common to have bruising and tenderness at the site.  Follow instructions from your health care provider about how to take care of your radial site wound. Check the wound every day for signs of infection.  Do not lift anything that is heavier than 10 lb (4.5 kg), or the limit that you are told, until your health care provider says  that it is safe. This information is not intended to replace advice given to you by your health care provider. Make sure you discuss any questions you have with your health care provider. Document Revised: 11/10/2017 Document Reviewed: 11/10/2017 Elsevier Patient Education  2020 [employer]  

## 2019-11-13 NOTE — Progress Notes (Signed)
Discharge instructions reviewed with patient and family. Verbalized understanding. 

## 2019-11-14 ENCOUNTER — Telehealth: Payer: Self-pay | Admitting: Interventional Cardiology

## 2019-11-14 MED ORDER — ATORVASTATIN CALCIUM 40 MG PO TABS: 40.0000 mg | ORAL_TABLET | Freq: Every day | ORAL | 3 refills | Status: DC

## 2019-11-14 MED ORDER — CARVEDILOL 3.125 MG PO TABS: ORAL_TABLET | ORAL | 11 refills | Status: DC

## 2019-11-14 NOTE — Telephone Encounter (Signed)
Pt's medication was sent to pt's pharmacy as requested. Confirmation received.  °

## 2019-11-14 NOTE — Telephone Encounter (Signed)
*  STAT* If patient is at the pharmacy, call can be transferred to refill team.   1. Which medications need to be refilled? (please list name of each medication and dose if known)  atorvastatin (LIPITOR) 40 MG tablet carvedilol (COREG) 3.125 MG tablet  2. Which pharmacy/location (including street and city if local pharmacy) is medication to be sent to? [facility] - [place], [place] - [place] [address]  3. Do they need a 30 day or 90 day supply? 90 day

## 2019-11-17 ENCOUNTER — Ambulatory Visit: Payer: Medicare Other

## 2019-11-22 ENCOUNTER — Telehealth: Payer: Self-pay

## 2019-11-22 NOTE — Telephone Encounter (Signed)
   [facility] Pre-operative Risk Assessment    Request for surgical clearance:  1. What type of surgery is being performed? LEFT TOTAL HIP ARTHROPLASTY   2. When is this surgery scheduled? 01/10/2020   3. What type of clearance is required (medical clearance vs. Pharmacy clearance to hold med vs. Both)? BOTH  4. Are there any medications that need to be held prior to surgery and how long? NOT LISTED-PT TAKES ELIQUIS   5. Practice name and name of physician performing surgery?  [facility] ATTN:KELLY  6. What is your office phone number [phone]    7.   What is your office fax number  [phone]  8.   Anesthesia type (None, local, MAC, general) ? CHOICE

## 2019-11-22 NOTE — Telephone Encounter (Signed)
Pharmacy, can you please comment on holding Eliquis for upcoming procedure?  Thank you!

## 2019-11-22 NOTE — Telephone Encounter (Signed)
I will forward clearance note to Dr. Alison Murray for upcoming appt.Will send FYI to surgeon pt has appt with cardiology for clearance. I will remove from the pre op call back pool.

## 2019-11-22 NOTE — Telephone Encounter (Signed)
   [place], MD  Chart reviewed as part of pre-operative protocol coverage. Patient recently seen by Dr. Irish Lack on 11/10/2019 and right/left heart cath was ordered for upcoming total hip replacement. Cath was performed on 11/13/2019 and showed mild non-obstructive CAD and nonischemic cardiomyopathy. Per cath note, "no further cardiac testing needed before hip surgery." However, patient has follow-up appointment with Dr. Irish Lack scheduled for 11/30/2019. Therefore, will have patient keep this appointment to make sure he is still doing well before sending pre-op risk assessment form.   Pre-op covering staff: - Please contact requesting surgeon's office via preferred method (i.e, phone, fax) to inform them of above appointment prior to surgery.   Darreld Mclean, PA-C  11/22/2019, 9:36 AM

## 2019-11-22 NOTE — Telephone Encounter (Signed)
Pt takes Eliquis for afib with CHADS2VASc score of 5 (age x2, CHF, HTN, CAD). Renal function is normal. Recommend holding Eliquis for 3 days prior to THA.

## 2019-11-22 NOTE — Telephone Encounter (Signed)
I don't really need to see him in the office since we just did his cath, especially if surgery is in the near future.  He is cleared for surgery.  Avoid excess IV fluids given lower EF.  No further testing needed.    If surgery is delayed, we can see him closer to the surgery.  JV

## 2019-11-23 NOTE — Telephone Encounter (Signed)
   Primary Cardiologist: Larae Grooms, MD  Chart reviewed as part of pre-operative protocol coverage. Patient was recently seen by Dr. Irish Lack on 11/10/2019 at which time right/left cardiac catheterization was ordered due to upcoming total hip replacement. Cath was performed on 11/13/2019 and showed mild non-obstructive scattered CAD and nonischemic cardiomyopathy with EFof 35-45%. Patient had a follow-up planned for 11/30/2019 but Dr. Irish Lack said this is not needed given cath results. Per Dr. Irish Lack, he is OK for surgery without any further cardiac testing. He recommends avoiding excess IV fluids given mildly decreased LVEF.  Per Pharmacy, recommend holding Eliquis for 3 days prior to surgery. This should restarted as soon as able following procedure.   I will route this recommendation to the requesting party via Epic fax function and remove from pre-op pool.  Please call with questions.  Darreld Mclean, PA-C 11/23/2019, 8:23 AM

## 2019-11-23 NOTE — Telephone Encounter (Signed)
Hey [place]! I sent over the clearance form to the requesting surgeon's office. But I didn't want to cancel/reschedule the patient's appointment on the 11/30/2019 without confirming with you. He does not need that appointment anymore right?  Thank you!  Kashari Chalmers

## 2019-11-24 NOTE — Telephone Encounter (Signed)
Left message for daughter [name] to call back.  If patient is not having any issues and cath site is okay we will cancel appt on 2/11.

## 2019-11-24 NOTE — Telephone Encounter (Signed)
Spoke to [employer]. Patient is doing well. Denies having any issues. Cath site looks good. Will cancel appt on 2/11. They will let us know if his Sx change at all. 6 month recall placed.

## 2019-11-25 ENCOUNTER — Ambulatory Visit: Payer: Medicare Other

## 2019-11-30 ENCOUNTER — Ambulatory Visit: Payer: Medicare Other | Admitting: Interventional Cardiology

## 2019-12-04 ENCOUNTER — Ambulatory Visit: Payer: Medicare Other

## 2019-12-27 NOTE — H&P (Signed)
TOTAL HIP ADMISSION H&P  Patient is admitted for left total hip arthroplasty.  Subjective:  Chief Complaint: left hip pain  HPI: [place] Sink., 86 y.o. male, has a history of pain and functional disability in the left hip(s) due to arthritis and patient has failed non-surgical conservative treatments for greater than 12 weeks to include NSAID's and/or analgesics, flexibility and strengthening excercises, use of assistive devices and activity modification.  Onset of symptoms was gradual starting 3 years ago with gradually worsening course since that time.The patient noted no past surgery on the left hip(s).  Patient currently rates pain in the left hip at 9 out of 10 with activity. Patient has night pain, worsening of pain with activity and weight bearing, pain that interfers with activities of daily living and pain with passive range of motion. Patient has evidence of subchondral cysts, periarticular osteophytes and joint space narrowing by imaging studies. This condition presents safety issues increasing the risk of falls.  There is no current active infection.  Patient Active Problem List   Diagnosis Date Noted  . Preoperative cardiovascular examination   . Shortness of breath   . Bilateral pleural effusion   . Thrombocytopenia ([place]) 06/03/2018  . Atrial flutter ([place]) 06/03/2018  . Acute on chronic systolic CHF (congestive heart failure) ([place]) 06/03/2018  . Bilateral edema of lower extremity   . Essential hypertension 09/19/2015  . LBBB (left bundle branch block) 09/19/2015  . Hypokalemia 08/27/2014  . Hypertension   . ASCVD (arteriosclerotic cardiovascular disease)   . Dyslipidemia   . ED (erectile dysfunction)   . Left bundle branch block   . Postsurgical percutaneous transluminal coronary angioplasty status   . Coronary atherosclerosis of native coronary artery   . Bilateral inguinal hernia 06/15/2012   Past Medical History:  Diagnosis Date  . ASCVD (arteriosclerotic  cardiovascular disease)    single vessel  . Coronary atherosclerosis of native coronary artery   . Dyslipidemia   . ED (erectile dysfunction)   . Hypertension   . Inguinal hernia   . Left bundle branch block    chronic  . Postsurgical percutaneous transluminal coronary angioplasty status     Past Surgical History:  Procedure Laterality Date  . CARDIAC CATHETERIZATION  1998  . CARDIOVASCULAR STRESS TEST  05/25/12  . CHOLECYSTECTOMY  1979  . [place]  . INGUINAL HERNIA REPAIR  06/15/2012   Procedure: HERNIA REPAIR INGUINAL ADULT;  Surgeon: Adin Hector, MD;  Location: [facility];  Service: General;  Laterality: Bilateral;  Bilateral Inguinal Hernia Repair with Ultrapro Mesh  . RIGHT/LEFT HEART CATH AND CORONARY ANGIOGRAPHY N/A 11/13/2019   Procedure: RIGHT/LEFT HEART CATH AND CORONARY ANGIOGRAPHY;  Surgeon: Jettie Booze, MD;  Location: [facility];  Service: Cardiovascular;  Laterality: N/A;       Current Outpatient Medications  Medication Sig Dispense Refill Last Dose  . apixaban (ELIQUIS) 2.5 MG TABS tablet Take 1 tablet (2.5 mg total) by mouth 2 (two) times daily. 60 tablet 0   . atorvastatin (LIPITOR) 40 MG tablet Take 1 tablet (40 mg total) by mouth daily at 6 PM. 90 tablet 3   . b complex vitamins tablet Take 1 tablet by mouth daily.     . carvedilol (COREG) 3.125 MG tablet TAKE 1 TABLET (3.125 MG TOTAL) BY MOUTH 2 (TWO) TIMES DAILY WITH A MEAL. 60 tablet 11   . furosemide (LASIX) 20 MG tablet Take 1 tablet (20 mg total) by mouth daily. [place]  tablet 0   . lisinopril (PRINIVIL,ZESTRIL) 20 MG tablet Take 1 tablet (20 mg total) by mouth daily. 30 tablet 0   . Multiple Vitamin (MULTIVITAMIN WITH MINERALS) TABS tablet Take 1 tablet by mouth every other day. [facility]  Spectravite Multivitamin     . Multiple Vitamins-Minerals (OCUVITE-LUTEIN PO) Take 1 capsule by mouth daily.     Vladimir Faster Glycol-Propyl Glycol (SYSTANE) 0.4-0.3 % SOLN Apply 1 drop to eye 2  (two) times daily as needed (dry eyes).      Marland Kitchen spironolactone (ALDACTONE) 25 MG tablet Take 0.5 tablets (12.5 mg total) by mouth daily. 30 tablet 0   . traMADol (ULTRAM) 50 MG tablet Take 100 mg by mouth daily.      No Known Allergies  Social History   Tobacco Use  . Smoking status: Never Smoker  . Smokeless tobacco: Never Used  Substance Use Topics  . Alcohol use: No    Family History  Problem Relation Age of Onset  . Arthritis Father   . Hypertension Paternal Uncle   . Heart attack Neg Hx   . Stroke Neg Hx      Review of Systems  Constitutional: Negative.   HENT: Positive for hearing loss. Negative for congestion, dental problem, drooling, ear discharge, ear pain, facial swelling, mouth sores, nosebleeds, postnasal drip, rhinorrhea, sinus pressure, sinus pain, sneezing, sore throat, tinnitus, trouble swallowing and voice change.   Eyes: Negative.   Respiratory: Negative for apnea, cough, choking, chest tightness, shortness of breath, wheezing and stridor.   Cardiovascular: Negative.   Gastrointestinal: Negative.   Endocrine: Negative.   Genitourinary: Negative.   Musculoskeletal: Positive for arthralgias, back pain and myalgias. Negative for gait problem, joint swelling, neck pain and neck stiffness.  Skin: Negative.   Allergic/Immunologic: Negative.   Neurological: Negative.   Hematological: Negative.   Psychiatric/Behavioral: Negative.     Objective:  Physical Exam  Constitutional: He is oriented to person, place, and time. He appears well-developed and well-nourished. No distress.  HENT:  Head: Normocephalic and atraumatic.  Right Ear: External ear normal.  Left Ear: External ear normal.  Nose: Nose normal.  Mouth/Throat: Oropharynx is clear and moist.  Eyes: Conjunctivae and EOM are normal.  Cardiovascular: Normal rate, regular rhythm, normal heart sounds and intact distal pulses.  No murmur heard. Respiratory: Effort normal and breath sounds normal. No  respiratory distress. He has no wheezes.  GI: Soft. He exhibits no distension. There is no abdominal tenderness.  Musculoskeletal:     Cervical back: Normal range of motion and neck supple.     Comments: Left Hip Exam: The range of motion: Flexion to 70 degrees, Internal Rotation to 0 degrees, External Rotation to 0 degrees, and abduction to 0 degrees with pain. There is no tenderness over the greater trochanteric bursa.  Neurological: He is alert and oriented to person, place, and time. He has normal strength. No sensory deficit.  Skin: No rash noted. He is not diaphoretic. No erythema.  Psychiatric: He has a normal mood and affect. His behavior is normal.    Ht: 5 ft 9 in  Wt: 135 lbs With clothes  BMI: 19.9  BP: 132/74 sitting L arm Pulse: 64 bpm regular   Imaging Review Plain radiographs demonstrate severe degenerative joint disease of the left hip(s). The bone quality appears to be good for age and reported activity level.   Assessment/Plan:  End stage primary osteoarthritis, left hip(s)  The patient history, physical examination, clinical judgement of the provider and  imaging studies are consistent with end stage degenerative joint disease of the left hip(s) and total hip arthroplasty is deemed medically necessary. The treatment options including medical management, injection therapy, arthroscopy and arthroplasty were discussed at length. The risks and benefits of total hip arthroplasty were presented and reviewed. The risks due to aseptic loosening, infection, stiffness, dislocation/subluxation,  thromboembolic complications and other imponderables were discussed.  The patient acknowledged the explanation, agreed to proceed with the plan and consent was signed. Patient is being admitted for inpatient treatment for surgery, pain control, PT, OT, prophylactic antibiotics, VTE prophylaxis, progressive ambulation and ADL's and discharge planning.The patient is planning to be discharged  home.  Risks and benefits of the surgery were discussed with the patient and Dr.Aluisio at their previous office visit, and the patient has elected to move forward with the aforementioned surgery. Post-operative care plans were discussed with the patient today.    Therapy Plans: outpatient therapy at [facility] Disposition: Home with family Planned DVT prophylaxis: resume Eliquis DME needed: has walker PCP: Dr. Harrington Challenger- clearance received Cardio: Dr. Irish Lack- clearance received AVOID EXCESS IV FLUIDS Other: no anesthesia concerns Instructed patient on meds to stop prior to surgery  Ardeen Jourdain, PA-C

## 2020-01-01 NOTE — Progress Notes (Signed)
DUE TO COVID-19 ONLY ONE VISITOR IS ALLOWED TO COME WITH YOU AND STAY IN THE WAITING ROOM ONLY DURING PRE OP AND PROCEDURE DAY OF SURGERY. THE 1 VISITOR MAY VISIT WITH YOU AFTER SURGERY IN YOUR PRIVATE ROOM DURING VISITING HOURS ONLY!  YOU NEED TO HAVE A COVID 19 TEST ON_______ @_______ , THIS TEST MUST BE DONE BEFORE SURGERY, COME  [place], [place] [address] , [address].  ([place]) ONCE YOUR COVID TEST IS COMPLETED, PLEASE BEGIN THE QUARANTINE INSTRUCTIONS AS OUTLINED IN YOUR HANDOUT.                Parker Odonnell.  01/01/2020   Your procedure is scheduled on:  01/10/20   Report to [facility] Main  Entrance   Report to admitting at   0515am      Call this number if you have problems the morning of surgery [phone]    Remember: Do not eat food or drink liquids :After Midnight. BRUSH YOUR TEETH MORNING OF SURGERY AND RINSE YOUR MOUTH OUT, NO CHEWING GUM CANDY OR MINTS.     Take these medicines the morning of surgery with A SIP OF WATER:  Carvedilol ( Coreg)   SURGERY                            You may not have any metal on your body including hair pins and              piercings  Do not wear jewelry, make-up, lotions, powders or perfumes, deodorant                       Men may shave face and neck.   Do not bring valuables to the hospital. [place].  Contacts, dentures or bridgework may not be worn into surgery.  Leave suitcase in the car. After surgery it may be brought to your room.     Patients discharged the day of surgery will not be allowed to drive home. IF YOU ARE HAVING SURGERY AND GOING HOME THE SAME DAY, YOU MUST HAVE AN ADULT TO DRIVE YOU HOME AND BE WITH YOU FOR 24 HOURS. YOU MAY GO HOME BY TAXI OR UBER OR ORTHERWISE, BUT AN ADULT MUST ACCOMPANY YOU HOME AND STAY WITH YOU FOR 24 HOURS.  Name and phone number of your driver:               Please read over the following fact sheets you  were given: _____________________________________________________________________             NO SOLID FOOD AFTER MIDNIGHT THE NIGHT PRIOR TO SURGERY. NOTHING BY MOUTH EXCEPT CLEAR LIQUIDS UNTIL 0430am. PLEASE FINISH ENSURE DRINK PER SURGEON ORDER  WHICH NEEDS TO BE COMPLETED AT .0430am   CLEAR LIQUID DIET   Foods Allowed                                                                     Foods Excluded  Coffee and tea, regular and decaf  liquids that you cannot  Plain Jell-O any favor except red or purple                                           see through such as: Fruit ices (not with fruit pulp)                                     milk, soups, orange juice  Iced Popsicles                                    All solid food Carbonated beverages, regular and diet                                    Cranberry, grape and apple juices Sports drinks like Gatorade Lightly seasoned clear broth or consume(fat free) Sugar, honey syrup  Sample Menu Breakfast                                Lunch                                     Supper Cranberry juice                    Beef broth                            Chicken broth Jell-O                                     Grape juice                           Apple juice Coffee or tea                        Jell-O                                      Popsicle                                                Coffee or tea                        Coffee or tea  _____________________________________________________________________  [facility] - Preparing for Surgery Before surgery, you can play an important role.  Because skin is not sterile, your skin needs to be as free of germs as possible.  You can reduce the number of germs on your skin by washing with CHG (chlorahexidine gluconate) soap before surgery.  CHG is an antiseptic cleaner which kills  germs and bonds with the skin to continue killing germs even after  washing. Please DO NOT use if you have an allergy to CHG or antibacterial soaps.  If your skin becomes reddened/irritated stop using the CHG and inform your nurse when you arrive at Short Stay. Do not shave (including legs and underarms) for at least 48 hours prior to the first CHG shower.  You may shave your face/neck. Please follow these instructions carefully:  1.  Shower with CHG Soap the night before surgery and the  morning of Surgery.  2.  If you choose to wash your hair, wash your hair first as usual with your  normal  shampoo.  3.  After you shampoo, rinse your hair and body thoroughly to remove the  shampoo.                           4.  Use CHG as you would any other liquid soap.  You can apply chg directly  to the skin and wash                       Gently with a scrungie or clean washcloth.  5.  Apply the CHG Soap to your body ONLY FROM THE NECK DOWN.   Do not use on face/ open                           Wound or open sores. Avoid contact with eyes, ears mouth and genitals (private parts).                       Wash face,  Genitals (private parts) with your normal soap.             6.  Wash thoroughly, paying special attention to the area where your surgery  will be performed.  7.  Thoroughly rinse your body with warm water from the neck down.  8.  DO NOT shower/wash with your normal soap after using and rinsing off  the CHG Soap.                9.  Pat yourself dry with a clean towel.            10.  Wear clean pajamas.            11.  Place clean sheets on your bed the night of your first shower and do not  sleep with pets. Day of Surgery : Do not apply any lotions/deodorants the morning of surgery.  Please wear clean clothes to the hospital/surgery center.  FAILURE TO FOLLOW THESE INSTRUCTIONS MAY RESULT IN THE CANCELLATION OF YOUR SURGERY PATIENT SIGNATURE_________________________________  NURSE  SIGNATURE__________________________________  ________________________________________________________________________   Parker Odonnell  An incentive spirometer is a tool that can help keep your lungs clear and active. This tool measures how well you are filling your lungs with each breath. Taking long deep breaths may help reverse or decrease the chance of developing breathing (pulmonary) problems (especially infection) following:  A long period of time when you are unable to move or be active. BEFORE THE PROCEDURE   If the spirometer includes an indicator to show your best effort, your nurse or respiratory therapist will set it to a desired goal.  If possible, sit up straight or lean slightly forward. Try not to slouch.  Hold the incentive spirometer in an  upright position. INSTRUCTIONS FOR USE  1. Sit on the edge of your bed if possible, or sit up as far as you can in bed or on a chair. 2. Hold the incentive spirometer in an upright position. 3. Breathe out normally. 4. Place the mouthpiece in your mouth and seal your lips tightly around it. 5. Breathe in slowly and as deeply as possible, raising the piston or the ball toward the top of the column. 6. Hold your breath for 3-5 seconds or for as long as possible. Allow the piston or ball to fall to the bottom of the column. 7. Remove the mouthpiece from your mouth and breathe out normally. 8. Rest for a few seconds and repeat Steps 1 through 7 at least 10 times every 1-2 hours when you are awake. Take your time and take a few normal breaths between deep breaths. 9. The spirometer may include an indicator to show your best effort. Use the indicator as a goal to work toward during each repetition. 10. After each set of 10 deep breaths, practice coughing to be sure your lungs are clear. If you have an incision (the cut made at the time of surgery), support your incision when coughing by placing a pillow or rolled up towels firmly  against it. Once you are able to get out of bed, walk around indoors and cough well. You may stop using the incentive spirometer when instructed by your caregiver.  RISKS AND COMPLICATIONS  Take your time so you do not get dizzy or light-headed.  If you are in pain, you may need to take or ask for pain medication before doing incentive spirometry. It is harder to take a deep breath if you are having pain. AFTER USE  Rest and breathe slowly and easily.  It can be helpful to keep track of a log of your progress. Your caregiver can provide you with a simple table to help with this. If you are using the spirometer at home, follow these instructions: [place] IF:   You are having difficultly using the spirometer.  You have trouble using the spirometer as often as instructed.  Your pain medication is not giving enough relief while using the spirometer.  You develop fever of 100.5 F (38.1 C) or higher. SEEK IMMEDIATE MEDICAL CARE IF:   You cough up bloody sputum that had not been present before.  You develop fever of 102 F (38.9 C) or greater.  You develop worsening pain at or near the incision site. MAKE SURE YOU:   Understand these instructions.  Will watch your condition.  Will get help right away if you are not doing well or get worse. Document Released: 02/15/2007 Document Revised: 12/28/2011 Document Reviewed: 04/18/2007 [employer] Patient Information 2014 [employer], [place].   ________________________________________________________________________  WHAT IS A BLOOD TRANSFUSION? Blood Transfusion Information  A transfusion is the replacement of blood or some of its parts. Blood is made up of multiple cells which provide different functions.  Red blood cells carry oxygen and are used for blood loss replacement.  White blood cells fight against infection.  Platelets control bleeding.  Plasma helps clot blood.  Other blood products are available for  specialized needs, such as hemophilia or other clotting disorders. BEFORE THE TRANSFUSION  Who gives blood for transfusions?   Healthy volunteers who are fully evaluated to make sure their blood is safe. This is blood bank blood. Transfusion therapy is the safest it has ever been in the practice of medicine.  Before blood is taken from a donor, a complete history is taken to make sure that person has no history of diseases nor engages in risky social behavior (examples are intravenous drug use or sexual activity with multiple partners). The donor's travel history is screened to minimize risk of transmitting infections, such as malaria. The donated blood is tested for signs of infectious diseases, such as HIV and hepatitis. The blood is then tested to be sure it is compatible with you in order to minimize the chance of a transfusion reaction. If you or a relative donates blood, this is often done in anticipation of surgery and is not appropriate for emergency situations. It takes many days to process the donated blood. RISKS AND COMPLICATIONS Although transfusion therapy is very safe and saves many lives, the main dangers of transfusion include:   Getting an infectious disease.  Developing a transfusion reaction. This is an allergic reaction to something in the blood you were given. Every precaution is taken to prevent this. The decision to have a blood transfusion has been considered carefully by your caregiver before blood is given. Blood is not given unless the benefits outweigh the risks. AFTER THE TRANSFUSION  Right after receiving a blood transfusion, you will usually feel much better and more energetic. This is especially true if your red blood cells have gotten low (anemic). The transfusion raises the level of the red blood cells which carry oxygen, and this usually causes an energy increase.  The nurse administering the transfusion will monitor you carefully for complications. HOME CARE  INSTRUCTIONS  No special instructions are needed after a transfusion. You may find your energy is better. Speak with your caregiver about any limitations on activity for underlying diseases you may have. SEEK MEDICAL CARE IF:   Your condition is not improving after your transfusion.  You develop redness or irritation at the intravenous (IV) site. SEEK IMMEDIATE MEDICAL CARE IF:  Any of the following symptoms occur over the next 12 hours:  Shaking chills.  You have a temperature by mouth above 102 F (38.9 C), not controlled by medicine.  Chest, back, or muscle pain.  People around you feel you are not acting correctly or are confused.  Shortness of breath or difficulty breathing.  Dizziness and fainting.  You get a rash or develop hives.  You have a decrease in urine output.  Your urine turns a dark color or changes to pink, red, or brown. Any of the following symptoms occur over the next 10 days:  You have a temperature by mouth above 102 F (38.9 C), not controlled by medicine.  Shortness of breath.  Weakness after normal activity.  The white part of the eye turns yellow (jaundice).  You have a decrease in the amount of urine or are urinating less often.  Your urine turns a dark color or changes to pink, red, or brown. Document Released: 10/02/2000 Document Revised: 12/28/2011 Document Reviewed: 05/21/2008 [facility] Patient Information 2014 [place], [place].  _______________________________________________________________________

## 2020-01-02 ENCOUNTER — Other Ambulatory Visit: Payer: Self-pay

## 2020-01-02 ENCOUNTER — Encounter (HOSPITAL_COMMUNITY)
Admission: RE | Admit: 2020-01-02 | Discharge: 2020-01-02 | Disposition: A | Discharge status: Home / Self Care | Payer: Medicare Other | Source: Ambulatory Visit | Attending: Orthopedic Surgery | Admitting: Orthopedic Surgery

## 2020-01-02 ENCOUNTER — Encounter (HOSPITAL_COMMUNITY): Payer: Self-pay

## 2020-01-02 DIAGNOSIS — Z79899 Other long term (current) drug therapy: Secondary | ICD-10-CM | POA: Insufficient documentation

## 2020-01-02 DIAGNOSIS — Z01812 Encounter for preprocedural laboratory examination: Secondary | ICD-10-CM | POA: Diagnosis not present

## 2020-01-02 DIAGNOSIS — I2510 Atherosclerotic heart disease of native coronary artery without angina pectoris: Secondary | ICD-10-CM | POA: Diagnosis not present

## 2020-01-02 DIAGNOSIS — M1612 Unilateral primary osteoarthritis, left hip: Secondary | ICD-10-CM | POA: Diagnosis not present

## 2020-01-02 DIAGNOSIS — Z7901 Long term (current) use of anticoagulants: Secondary | ICD-10-CM | POA: Insufficient documentation

## 2020-01-02 DIAGNOSIS — I110 Hypertensive heart disease with heart failure: Secondary | ICD-10-CM | POA: Diagnosis not present

## 2020-01-02 DIAGNOSIS — I447 Left bundle-branch block, unspecified: Secondary | ICD-10-CM | POA: Diagnosis not present

## 2020-01-02 DIAGNOSIS — E785 Hyperlipidemia, unspecified: Secondary | ICD-10-CM | POA: Diagnosis not present

## 2020-01-02 DIAGNOSIS — I509 Heart failure, unspecified: Secondary | ICD-10-CM | POA: Diagnosis not present

## 2020-01-02 HISTORY — DX: Personal history of urinary calculi: Z87.442

## 2020-01-02 HISTORY — DX: Heart failure, unspecified: I50.9

## 2020-01-02 HISTORY — DX: Unspecified osteoarthritis, unspecified site: M19.90

## 2020-01-02 HISTORY — DX: Gastro-esophageal reflux disease without esophagitis: K21.9

## 2020-01-02 NOTE — Patient Instructions (Addendum)
DUE TO COVID-19 ONLY ONE VISITOR IS ALLOWED TO COME WITH YOU AND STAY IN THE WAITING ROOM ONLY DURING PRE OP AND PROCEDURE DAY OF SURGERY. THE 1 VISITOR MAY VISIT WITH YOU AFTER SURGERY IN YOUR PRIVATE ROOM DURING VISITING HOURS ONLY!  YOU NEED TO HAVE A COVID 19 TEST ON__3-20-_____ @_1045______ , THIS TEST MUST BE DONE BEFORE SURGERY, COME  Parker Odonnell , [id number].  ([place]) ONCE YOUR COVID TEST IS COMPLETED, PLEASE BEGIN THE QUARANTINE INSTRUCTIONS AS OUTLINED IN YOUR HANDOUT.                Parker Odonnell.  01/02/2020   Your procedure is scheduled on: 01-10-20    Report to [facility] Main  Entrance   Report to short stay  at   0530 am      Call this number if you have problems the morning of surgery [phone]    Remember: Do not eat food  :After Midnight.  BRUSH YOUR TEETH MORNING OF SURGERY AND RINSE YOUR MOUTH OUT, NO CHEWING GUM CANDY OR MINTS.     Take these medicines the morning of surgery with A SIP OF WATER: carvedilol(coreg)                             You may not have any metal on your body including hair pins and              piercings  Do not wear jewelry,  lotions, powders or perfumes, deodorant                       Men may shave face and neck.   Do not bring valuables to the hospital. [place].  Contacts, dentures or bridgework may not be worn into surgery.  Leave suitcase in the car. After surgery it may be brought to your room.     Patients discharged the day of surgery will not be allowed to drive home. IF YOU ARE HAVING SURGERY AND GOING HOME THE SAME DAY, YOU MUST HAVE AN ADULT TO DRIVE YOU HOME AND BE WITH YOU FOR 24 HOURS. YOU MAY GO HOME BY TAXI OR UBER OR ORTHERWISE, BUT AN ADULT MUST ACCOMPANY YOU HOME AND STAY WITH YOU FOR 24 HOURS.  Name and phone number of your driver:               Please read over the following fact sheets you were  given: _____________________________________________________________________             NO SOLID FOOD AFTER MIDNIGHT THE NIGHT PRIOR TO SURGERY. NOTHING BY MOUTH EXCEPT CLEAR LIQUIDS UNTIL 0415 am. PLEASE FINISH ENSURE DRINK PER SURGEON ORDER  WHICH NEEDS TO BE COMPLETED AT .[id number] am   CLEAR LIQUID DIET   Foods Allowed  Foods Excluded  Coffee and tea, regular and decaf   No creamer                                         liquids that you cannot  Plain Jell-O any favor except red or purple                                           see through such as: Fruit ices (not with fruit pulp)                                                                   milk, soups, orange juice  Iced Popsicles                                                                        All solid food Carbonated beverages, regular and diet                                    Cranberry, grape and apple juices Sports drinks like Gatorade Lightly seasoned clear broth or consume(fat free) Sugar, honey syrup  Sample Menu Breakfast                                Lunch                                     Supper Cranberry juice                    Beef broth                            Chicken broth Jell-O                                     Grape juice                           Apple juice Coffee or tea                        Jell-O                                      Popsicle  Coffee or tea                        Coffee or tea  _____________________________________________________________________  [facility] - Preparing for Surgery Before surgery, you can play an important role.  Because skin is not sterile, your skin needs to be as free of germs as possible.  You can reduce the number of germs on your skin by washing with CHG (chlorahexidine gluconate) soap before surgery.  CHG  is an antiseptic cleaner which kills germs and bonds with the skin to continue killing germs even after washing. Please DO NOT use if you have an allergy to CHG or antibacterial soaps.  If your skin becomes reddened/irritated stop using the CHG and inform your nurse when you arrive at Short Stay. Do not shave (including legs and underarms) for at least 48 hours prior to the first CHG shower.  You may shave your face/neck. Please follow these instructions carefully:  1.  Shower with CHG Soap the night before surgery and the  morning of Surgery.  2.  If you choose to wash your hair, wash your hair first as usual with your  normal  shampoo.  3.  After you shampoo, rinse your hair and body thoroughly to remove the  shampoo.                           4.  Use CHG as you would any other liquid soap.  You can apply chg directly  to the skin and wash                       Gently with a scrungie or clean washcloth.  5.  Apply the CHG Soap to your body ONLY FROM THE NECK DOWN.   Do not use on face/ open                           Wound or open sores. Avoid contact with eyes, ears mouth and genitals (private parts).                       Wash face,  Genitals (private parts) with your normal soap.             6.  Wash thoroughly, paying special attention to the area where your surgery  will be performed.  7.  Thoroughly rinse your body with warm water from the neck down.  8.  DO NOT shower/wash with your normal soap after using and rinsing off  the CHG Soap.                9.  Pat yourself dry with a clean towel.            10.  Wear clean pajamas.            11.  Place clean sheets on your bed the night of your first shower and do not  sleep with pets. Day of Surgery : Do not apply any lotions/deodorants the morning of surgery.  Please wear clean clothes to the hospital/surgery center.  FAILURE TO FOLLOW THESE INSTRUCTIONS MAY RESULT IN THE CANCELLATION OF YOUR SURGERY PATIENT  SIGNATURE_________________________________  NURSE SIGNATURE__________________________________  ________________________________________________________________________   Parker Odonnell  An incentive spirometer is a tool that can help keep your lungs clear and active. This tool measures how well  you are filling your lungs with each breath. Taking long deep breaths may help reverse or decrease the chance of developing breathing (pulmonary) problems (especially infection) following:  A long period of time when you are unable to move or be active. BEFORE THE PROCEDURE   If the spirometer includes an indicator to show your best effort, your nurse or respiratory therapist will set it to a desired goal.  If possible, sit up straight or lean slightly forward. Try not to slouch.  Hold the incentive spirometer in an upright position. INSTRUCTIONS FOR USE  1. Sit on the edge of your bed if possible, or sit up as far as you can in bed or on a chair. 2. Hold the incentive spirometer in an upright position. 3. Breathe out normally. 4. Place the mouthpiece in your mouth and seal your lips tightly around it. 5. Breathe in slowly and as deeply as possible, raising the piston or the ball toward the top of the column. 6. Hold your breath for 3-5 seconds or for as long as possible. Allow the piston or ball to fall to the bottom of the column. 7. Remove the mouthpiece from your mouth and breathe out normally. 8. Rest for a few seconds and repeat Steps 1 through 7 at least 10 times every 1-2 hours when you are awake. Take your time and take a few normal breaths between deep breaths. 9. The spirometer may include an indicator to show your best effort. Use the indicator as a goal to work toward during each repetition. 10. After each set of 10 deep breaths, practice coughing to be sure your lungs are clear. If you have an incision (the cut made at the time of surgery), support your incision when coughing  by placing a pillow or rolled up towels firmly against it. Once you are able to get out of bed, walk around indoors and cough well. You may stop using the incentive spirometer when instructed by your caregiver.  RISKS AND COMPLICATIONS  Take your time so you do not get dizzy or light-headed.  If you are in pain, you may need to take or ask for pain medication before doing incentive spirometry. It is harder to take a deep breath if you are having pain. AFTER USE  Rest and breathe slowly and easily.  It can be helpful to keep track of a log of your progress. Your caregiver can provide you with a simple table to help with this. If you are using the spirometer at home, follow these instructions: [place] IF:   You are having difficultly using the spirometer.  You have trouble using the spirometer as often as instructed.  Your pain medication is not giving enough relief while using the spirometer.  You develop fever of 100.5 F (38.1 C) or higher. SEEK IMMEDIATE MEDICAL CARE IF:   You cough up bloody sputum that had not been present before.  You develop fever of 102 F (38.9 C) or greater.  You develop worsening pain at or near the incision site. MAKE SURE YOU:   Understand these instructions.  Will watch your condition.  Will get help right away if you are not doing well or get worse. Document Released: 02/15/2007 Document Revised: 12/28/2011 Document Reviewed: 04/18/2007 ExitCare Patient Information 2014 ExitCare, [place].   ________________________________________________________________________  WHAT IS A BLOOD TRANSFUSION? Blood Transfusion Information  A transfusion is the replacement of blood or some of its parts. Blood is made up of multiple cells which provide  different functions.  Red blood cells carry oxygen and are used for blood loss replacement.  White blood cells fight against infection.  Platelets control bleeding.  Plasma helps clot  blood.  Other blood products are available for specialized needs, such as hemophilia or other clotting disorders. BEFORE THE TRANSFUSION  Who gives blood for transfusions?   Healthy volunteers who are fully evaluated to make sure their blood is safe. This is blood bank blood. Transfusion therapy is the safest it has ever been in the practice of medicine. Before blood is taken from a donor, a complete history is taken to make sure that person has no history of diseases nor engages in risky social behavior (examples are intravenous drug use or sexual activity with multiple partners). The donor's travel history is screened to minimize risk of transmitting infections, such as malaria. The donated blood is tested for signs of infectious diseases, such as HIV and hepatitis. The blood is then tested to be sure it is compatible with you in order to minimize the chance of a transfusion reaction. If you or a relative donates blood, this is often done in anticipation of surgery and is not appropriate for emergency situations. It takes many days to process the donated blood. RISKS AND COMPLICATIONS Although transfusion therapy is very safe and saves many lives, the main dangers of transfusion include:   Getting an infectious disease.  Developing a transfusion reaction. This is an allergic reaction to something in the blood you were given. Every precaution is taken to prevent this. The decision to have a blood transfusion has been considered carefully by your caregiver before blood is given. Blood is not given unless the benefits outweigh the risks. AFTER THE TRANSFUSION  Right after receiving a blood transfusion, you will usually feel much better and more energetic. This is especially true if your red blood cells have gotten low (anemic). The transfusion raises the level of the red blood cells which carry oxygen, and this usually causes an energy increase.  The nurse administering the transfusion will monitor  you carefully for complications. HOME CARE INSTRUCTIONS  No special instructions are needed after a transfusion. You may find your energy is better. Speak with your caregiver about any limitations on activity for underlying diseases you may have. SEEK MEDICAL CARE IF:   Your condition is not improving after your transfusion.  You develop redness or irritation at the intravenous (IV) site. SEEK IMMEDIATE MEDICAL CARE IF:  Any of the following symptoms occur over the next 12 hours:  Shaking chills.  You have a temperature by mouth above 102 F (38.9 C), not controlled by medicine.  Chest, back, or muscle pain.  People around you feel you are not acting correctly or are confused.  Shortness of breath or difficulty breathing.  Dizziness and fainting.  You get a rash or develop hives.  You have a decrease in urine output.  Your urine turns a dark color or changes to pink, red, or brown. Any of the following symptoms occur over the next 10 days:  You have a temperature by mouth above 102 F (38.9 C), not controlled by medicine.  Shortness of breath.  Weakness after normal activity.  The white part of the eye turns yellow (jaundice).  You have a decrease in the amount of urine or are urinating less often.  Your urine turns a dark color or changes to pink, red, or brown. Document Released: 10/02/2000 Document Revised: 12/28/2011 Document Reviewed: 05/21/2008 ExitCare Patient Information 2014  [employer].  _______________________________________________________________________

## 2020-01-02 NOTE — Progress Notes (Signed)
PCP - Dr. Melinda Crutch clearance 12-05-19 clearance on chart moderate risk Cardiologist - Dr Concepcion Living 11-10-19 epic   ekg 11-10-19 epic Chest x-ray -  Stress Test -  ECHO - 09-13-19 epic Cardiac Cath - 11-13-19 epic  Clearance in report   Sleep Study -  CPAP -   Fasting Blood Sugar -  Checks Blood Sugar _____ times a day  Blood Thinner Instructions:Elequis Aspirin Instructions: Last Dose:Stop 2 days prior  Anesthesia review: CHF  Ef 35-45%, afib  Patient denies shortness of breath, fever, cough and chest pain at PAT appointment   NONE   Patient verbalized understanding of instructions that were given to them at the PAT appointment. Patient was also instructed that they will need to review over the PAT instructions again at home before surgery.

## 2020-01-03 ENCOUNTER — Encounter (HOSPITAL_COMMUNITY)
Admission: RE | Admit: 2020-01-03 | Discharge: 2020-01-03 | Disposition: A | Discharge status: Home / Self Care | Payer: Medicare Other | Source: Ambulatory Visit | Attending: Orthopedic Surgery | Admitting: Orthopedic Surgery

## 2020-01-03 DIAGNOSIS — Z01812 Encounter for preprocedural laboratory examination: Secondary | ICD-10-CM | POA: Diagnosis not present

## 2020-01-03 LAB — PROTIME-INR
INR: 1.2 (ref 0.8–1.2)
Prothrombin Time: 14.6 seconds (ref 11.4–15.2)

## 2020-01-03 LAB — COMPREHENSIVE METABOLIC PANEL
ALT: 28 U/L (ref 0–44)
AST: 24 U/L (ref 15–41)
Albumin: 3.9 g/dL (ref 3.5–5.0)
Alkaline Phosphatase: 90 U/L (ref 38–126)
Anion gap: 6 (ref 5–15)
BUN: 15 mg/dL (ref 8–23)
CO2: 27 mmol/L (ref 22–32)
Calcium: 9.7 mg/dL (ref 8.9–10.3)
Chloride: 103 mmol/L (ref 98–111)
Creatinine, Ser: 0.95 mg/dL (ref 0.61–1.24)
GFR calc Af Amer: >60 mL/min (ref >60)
GFR calc non Af Amer: >60 mL/min (ref >60)
Glucose, Bld: 96 mg/dL (ref 70–99)
Potassium: 4.4 mmol/L (ref 3.5–5.1)
Sodium: 136 mmol/L (ref 135–145)
Total Bilirubin: 1.5 mg/dL — ABNORMAL HIGH (ref 0.3–1.2)
Total Protein: 6.8 g/dL (ref 6.5–8.1)

## 2020-01-03 LAB — CBC
HCT: 38.4 % — ABNORMAL LOW (ref 39.0–52.0)
Hemoglobin: 12.5 g/dL — ABNORMAL LOW (ref 13.0–17.0)
MCH: 34.1 pg — ABNORMAL HIGH (ref 26.0–34.0)
MCHC: 32.6 g/dL (ref 30.0–36.0)
MCV: 104.6 fL — ABNORMAL HIGH (ref 80.0–100.0)
Platelets: 131 10*3/uL — ABNORMAL LOW (ref 150–400)
RBC: 3.67 MIL/uL — ABNORMAL LOW (ref 4.22–5.81)
RDW: 13 % (ref 11.5–15.5)
WBC: 5.1 10*3/uL (ref 4.0–10.5)
nRBC: 0 % (ref 0.0–0.2)

## 2020-01-03 LAB — ABO/RH: ABO/RH(D): O POS

## 2020-01-03 LAB — APTT: aPTT: 29 seconds (ref 24–36)

## 2020-01-03 LAB — SURGICAL PCR SCREEN
MRSA, PCR: NEGATIVE
Staphylococcus aureus: POSITIVE — AB

## 2020-01-03 NOTE — Progress Notes (Signed)
PCR results 01/03/20 faxed to Dr. Wynelle Link via epic.

## 2020-01-04 NOTE — Progress Notes (Signed)
Anesthesia Chart Review   Case: [id number] Date/Time: 01/10/20 0700   Procedure: TOTAL HIP ARTHROPLASTY ANTERIOR APPROACH (Left Hip) - 121min   Anesthesia type: Choice   Pre-op diagnosis: Left hip osteoarthritis   Location: [facility] ROOM 10 / [facility]   Surgeons: Gaynelle Arabian, MD      DISCUSSION:86 y.o. never smoker with h/o HTN, GERD, LBBB, nonobstructive CAD, CHF, left hip OA scheduled for above procedure 01/10/20 with Dr. Gaynelle Arabian.   Per Sande Rives, PA-C with cardiology, "Patient recently seen by Dr. Irish Lack on 11/10/2019 and right/left heart cath was ordered for upcoming total hip replacement. Cath was performed on 11/13/2019 and showed mild non-obstructive CAD and nonischemic cardiomyopathy. Per cath note, "no further cardiac testing needed before hip surgery." However, patient has follow-up appointment with Dr. Irish Lack scheduled for 11/30/2019. Therefore, will have patient keep this appointment to make sure he is still doing well before sending pre-op risk assessment form."  Per Dr. Irish Lack no need to keep appointment on 11/30/19, he is cleared for surgery.    Anticipate pt can proceed with planned procedure barring acute status change.   VS: BP 124/60   Pulse 64   Temp 36.6 C (Oral)   Resp 18   Ht 5\' 8"  (1.727 m)   Wt 61.9 kg   SpO2 100%   BMI 20.75 kg/m   PROVIDERS: Lawerance Cruel, MD is PCP   Larae Grooms, MD is Cardiologist  LABS: Labs reviewed: Acceptable for surgery. (all labs ordered are listed, but only abnormal results are displayed)  Labs Reviewed  SURGICAL PCR SCREEN - Abnormal; Notable for the following components:      Result Value   Staphylococcus aureus POSITIVE (*)    All other components within normal limits  CBC - Abnormal; Notable for the following components:   RBC 3.67 (*)    Hemoglobin 12.5 (*)    HCT 38.4 (*)    MCV 104.6 (*)    MCH 34.1 (*)    Platelets 131 (*)    All other components within normal limits  COMPREHENSIVE METABOLIC  PANEL - Abnormal; Notable for the following components:   Total Bilirubin 1.5 (*)    All other components within normal limits  APTT  PROTIME-INR  TYPE AND SCREEN  ABO/RH     IMAGES:   EKG: 11/10/19 Rate 54 bpm   CV: Cardiac Cath 11/13/2019  Ost Cx to Prox Cx lesion is 25% stenosed.  1st Mrg lesion is 25% stenosed.  Mid LAD-1 lesion is 40% stenosed.  Previously placed Mid LAD-2 stent (unknown type) is widely patent.  There is mild left ventricular systolic dysfunction.  The left ventricular ejection fraction is 35-45% by visual estimate.  LV end diastolic pressure is normal. Normal right heart pressures.  There is no aortic valve stenosis.  Ao sat 98%, PA sat 76%, mean PA 22 mm Hg; mean PCWP 11 mm Hg; CO 6.0 L/min; CI 3.4   Mild, nonobstructive, scattered CAD.  Nonischemic cardiomyopathy.   No need for revascularization.  Continue with medical therapy of heart failure.  No further cardiac testing needed before hip surgery.  Avoid excessive IV fluid administration given mildly decreased LVEF.    Echo 09/13/2019 IMPRESSIONS    1. Left ventricular ejection fraction, by visual estimation, is 35 to  40%. The left ventricle has moderately decreased function. There is mildly  increased left ventricular hypertrophy.  2. Abnormal septal motion consistent with left bundle branch block.  3. Left ventricular diastolic parameters are  consistent with Grade II  diastolic dysfunction (pseudonormalization).  4. Global right ventricle has normal systolic function.The right  ventricular size is mildly enlarged. No increase in right ventricular wall  thickness.  5. Left atrial size was moderately dilated.  6. Right atrial size was mildly dilated.  7. The mitral valve is normal in structure. Trace mitral valve  regurgitation.  8. The tricuspid valve is normal in structure. Tricuspid valve  regurgitation mild-moderate.  9. The aortic valve is tricuspid. Aortic  valve regurgitation is not  visualized. Mild aortic valve sclerosis without stenosis.  10. The pulmonic valve was grossly normal. Pulmonic valve regurgitation is  not visualized.  11. Mildly elevated pulmonary artery systolic pressure.  12. The inferior vena cava is normal in size with greater than 50%  respiratory variability, suggesting right atrial pressure of 3 mmHg. Past Medical History:  Diagnosis Date  . Arthritis   . ASCVD (arteriosclerotic cardiovascular disease)    single vessel  . CHF (congestive heart failure) ([place])   . Coronary atherosclerosis of native coronary artery   . Dyslipidemia   . ED (erectile dysfunction)   . GERD (gastroesophageal reflux disease)    hx of  . History of kidney stones   . Hypertension   . Inguinal hernia   . Left bundle branch block    chronic  . Postsurgical percutaneous transluminal coronary angioplasty status     Past Surgical History:  Procedure Laterality Date  . CARDIAC CATHETERIZATION  1998  . CARDIOVASCULAR STRESS TEST  05/25/12  . CHOLECYSTECTOMY  1979  . [place]  . INGUINAL HERNIA REPAIR  06/15/2012   Procedure: HERNIA REPAIR INGUINAL ADULT;  Surgeon: Adin Hector, MD;  Location: [facility];  Service: General;  Laterality: Bilateral;  Bilateral Inguinal Hernia Repair with Ultrapro Mesh  . RIGHT/LEFT HEART CATH AND CORONARY ANGIOGRAPHY N/A 11/13/2019   Procedure: RIGHT/LEFT HEART CATH AND CORONARY ANGIOGRAPHY;  Surgeon: Jettie Booze, MD;  Location: [facility];  Service: Cardiovascular;  Laterality: N/A;    MEDICATIONS: . apixaban (ELIQUIS) 2.5 MG TABS tablet  . atorvastatin (LIPITOR) 40 MG tablet  . b complex vitamins tablet  . carvedilol (COREG) 3.125 MG tablet  . furosemide (LASIX) 20 MG tablet  . lisinopril (PRINIVIL,ZESTRIL) 20 MG tablet  . Multiple Vitamin (MULTIVITAMIN WITH MINERALS) TABS tablet  . Multiple Vitamins-Minerals (OCUVITE-LUTEIN PO)  . Polyethyl Glycol-Propyl Glycol  (SYSTANE) 0.4-0.3 % SOLN  . spironolactone (ALDACTONE) 25 MG tablet  . traMADol (ULTRAM) 50 MG tablet   No current facility-administered medications for this encounter.    Maia Plan [facility] Pre-Surgical Testing [phone] 01/04/20  3:06 PM

## 2020-01-06 ENCOUNTER — Inpatient Hospital Stay (HOSPITAL_COMMUNITY)
Admission: RE | Admit: 2020-01-06 | Discharge: 2020-01-06 | Disposition: A | Discharge status: Home / Self Care | Payer: Medicare Other | Source: Ambulatory Visit

## 2020-01-06 NOTE — Progress Notes (Signed)
Pt not tested for covid today due to pt testing + for covid on 11/24/19. Based on the guidelines the pt is in the 90 day window to not retest. The pt is still expected to quarantine until their procedure. Therefore, the pt can still have the scheduled procedure. The family sts they will bring proof of the results from [place] on the day of the procedure.  These are the guidelines as follows:  Guidance: Patient previously tested + COVID; now past 90 day window seeking elective surgery (asymptomatic)  Retest patient If negative, proceed with surgery If positive, postpone surgery for 10 days from positive test Patient to quarantine for the (10 days) Do not retest again prior to surgery (even if scheduled a couple of weeks out) Use standard precautions for surgery

## 2020-01-09 NOTE — Anesthesia Preprocedure Evaluation (Addendum)
Anesthesia Evaluation  Patient identified by MRN, date of birth, ID band Patient awake    Reviewed: Allergy & Precautions, NPO status   Airway Mallampati: II  TM Distance: >3 FB     Dental   Pulmonary    breath sounds clear to auscultation       Cardiovascular hypertension, + CAD and +CHF  + dysrhythmias  Rhythm:Regular Rate:Normal     Neuro/Psych    GI/Hepatic Neg liver ROS, GERD  ,  Endo/Other    Renal/GU negative Renal ROS     Musculoskeletal   Abdominal   Peds  Hematology   Anesthesia Other Findings   Reproductive/Obstetrics                            Anesthesia Physical Anesthesia Plan  ASA: III  Anesthesia Plan: General   Post-op Pain Management:    Induction: Intravenous  PONV Risk Score and Plan: 2 and Ondansetron, Dexamethasone and Midazolam  Airway Management Planned: Oral ETT  Additional Equipment:   Intra-op Plan:   Post-operative Plan: Possible Post-op intubation/ventilation  Informed Consent: I have reviewed the patients History and Physical, chart, labs and discussed the procedure including the risks, benefits and alternatives for the proposed anesthesia with the patient or authorized representative who has indicated his/her understanding and acceptance.     Dental advisory given  Plan Discussed with: Anesthesiologist, CRNA and Surgeon  Anesthesia Plan Comments:        Anesthesia Quick Evaluation

## 2020-01-10 ENCOUNTER — Other Ambulatory Visit: Payer: Self-pay

## 2020-01-10 ENCOUNTER — Encounter (HOSPITAL_COMMUNITY)
Admission: RE | Disposition: A | Discharge status: Home Health | Payer: Self-pay | Source: Home / Self Care | Attending: Orthopedic Surgery

## 2020-01-10 ENCOUNTER — Inpatient Hospital Stay (HOSPITAL_COMMUNITY)
Admission: RE | Admit: 2020-01-10 | Discharge: 2020-01-13 | DRG: 470 | Disposition: A | Discharge status: Home Health | Payer: Medicare Other | Attending: Orthopedic Surgery | Admitting: Orthopedic Surgery

## 2020-01-10 ENCOUNTER — Inpatient Hospital Stay (HOSPITAL_COMMUNITY): Payer: Medicare Other

## 2020-01-10 ENCOUNTER — Inpatient Hospital Stay (HOSPITAL_COMMUNITY): Payer: Medicare Other | Admitting: Physician Assistant

## 2020-01-10 ENCOUNTER — Inpatient Hospital Stay (HOSPITAL_COMMUNITY): Payer: Medicare Other | Admitting: Certified Registered Nurse Anesthetist

## 2020-01-10 ENCOUNTER — Encounter (HOSPITAL_COMMUNITY): Payer: Self-pay | Admitting: Orthopedic Surgery

## 2020-01-10 DIAGNOSIS — I110 Hypertensive heart disease with heart failure: Secondary | ICD-10-CM | POA: Diagnosis present

## 2020-01-10 DIAGNOSIS — M1612 Unilateral primary osteoarthritis, left hip: Secondary | ICD-10-CM | POA: Diagnosis present

## 2020-01-10 DIAGNOSIS — Z9049 Acquired absence of other specified parts of digestive tract: Secondary | ICD-10-CM

## 2020-01-10 DIAGNOSIS — Z79891 Long term (current) use of opiate analgesic: Secondary | ICD-10-CM

## 2020-01-10 DIAGNOSIS — Z7901 Long term (current) use of anticoagulants: Secondary | ICD-10-CM

## 2020-01-10 DIAGNOSIS — Z8261 Family history of arthritis: Secondary | ICD-10-CM

## 2020-01-10 DIAGNOSIS — I4892 Unspecified atrial flutter: Secondary | ICD-10-CM | POA: Diagnosis present

## 2020-01-10 DIAGNOSIS — Z8249 Family history of ischemic heart disease and other diseases of the circulatory system: Secondary | ICD-10-CM

## 2020-01-10 DIAGNOSIS — Z79899 Other long term (current) drug therapy: Secondary | ICD-10-CM | POA: Diagnosis not present

## 2020-01-10 DIAGNOSIS — I447 Left bundle-branch block, unspecified: Secondary | ICD-10-CM | POA: Diagnosis present

## 2020-01-10 DIAGNOSIS — E785 Hyperlipidemia, unspecified: Secondary | ICD-10-CM | POA: Diagnosis present

## 2020-01-10 DIAGNOSIS — I5022 Chronic systolic (congestive) heart failure: Secondary | ICD-10-CM | POA: Diagnosis present

## 2020-01-10 DIAGNOSIS — Z955 Presence of coronary angioplasty implant and graft: Secondary | ICD-10-CM | POA: Diagnosis not present

## 2020-01-10 DIAGNOSIS — Z96649 Presence of unspecified artificial hip joint: Secondary | ICD-10-CM

## 2020-01-10 DIAGNOSIS — D62 Acute posthemorrhagic anemia: Secondary | ICD-10-CM | POA: Diagnosis not present

## 2020-01-10 DIAGNOSIS — Z87442 Personal history of urinary calculi: Secondary | ICD-10-CM

## 2020-01-10 DIAGNOSIS — M169 Osteoarthritis of hip, unspecified: Secondary | ICD-10-CM | POA: Diagnosis present

## 2020-01-10 DIAGNOSIS — I2510 Atherosclerotic heart disease of native coronary artery without angina pectoris: Secondary | ICD-10-CM | POA: Diagnosis present

## 2020-01-10 DIAGNOSIS — Z419 Encounter for procedure for purposes other than remedying health state, unspecified: Secondary | ICD-10-CM

## 2020-01-10 HISTORY — PX: TOTAL HIP ARTHROPLASTY: SHX124

## 2020-01-10 SURGERY — ARTHROPLASTY, HIP, TOTAL, ANTERIOR APPROACH
Anesthesia: General | Site: Hip | Laterality: Left

## 2020-01-10 MED ORDER — DEXAMETHASONE SODIUM PHOSPHATE 10 MG/ML IJ SOLN
10.0000 mg | Freq: Once | INTRAMUSCULAR | Status: AC
  Administered 2020-01-11: 11:00:00 10 mg via INTRAVENOUS
  Filled 2020-01-10: qty 1

## 2020-01-10 MED ORDER — BUPIVACAINE HCL 0.25 % IJ SOLN
INTRAMUSCULAR | Status: DC | PRN
  Administered 2020-01-10: 30 mL

## 2020-01-10 MED ORDER — DOCUSATE SODIUM 100 MG PO CAPS
100.0000 mg | ORAL_CAPSULE | Freq: Two times a day (BID) | ORAL | Status: DC
  Administered 2020-01-10 – 2020-01-13 (×6): 100 mg via ORAL
  Filled 2020-01-10 (×6): qty 1

## 2020-01-10 MED ORDER — 0.9 % SODIUM CHLORIDE (POUR BTL) OPTIME
TOPICAL | Status: DC | PRN
  Administered 2020-01-10: 1000 mL

## 2020-01-10 MED ORDER — PROPOFOL 1000 MG/100ML IV EMUL
INTRAVENOUS | Status: AC
  Filled 2020-01-10: qty 100

## 2020-01-10 MED ORDER — ALBUMIN HUMAN 5 % IV SOLN
INTRAVENOUS | Status: AC
  Filled 2020-01-10: qty 250

## 2020-01-10 MED ORDER — ACETAMINOPHEN 325 MG PO TABS
325.0000 mg | ORAL_TABLET | Freq: Four times a day (QID) | ORAL | Status: DC | PRN
  Administered 2020-01-10: 650 mg via ORAL
  Filled 2020-01-10: qty 2

## 2020-01-10 MED ORDER — PROPOFOL 10 MG/ML IV BOLUS
INTRAVENOUS | Status: DC | PRN
  Administered 2020-01-10: 120 mg via INTRAVENOUS

## 2020-01-10 MED ORDER — TRAMADOL HCL 50 MG PO TABS
50.0000 mg | ORAL_TABLET | Freq: Four times a day (QID) | ORAL | Status: DC | PRN
  Administered 2020-01-11 – 2020-01-12 (×5): 100 mg via ORAL
  Filled 2020-01-10 (×4): qty 2
  Filled 2020-01-10: qty 1
  Filled 2020-01-10: qty 2

## 2020-01-10 MED ORDER — POVIDONE-IODINE 10 % EX SWAB: 2.0000 "application " | Freq: Once | CUTANEOUS | Status: DC

## 2020-01-10 MED ORDER — PROPOFOL 10 MG/ML IV BOLUS
INTRAVENOUS | Status: AC
  Filled 2020-01-10: qty 20

## 2020-01-10 MED ORDER — FENTANYL CITRATE (PF) 100 MCG/2ML IJ SOLN
INTRAMUSCULAR | Status: AC
  Filled 2020-01-10: qty 2

## 2020-01-10 MED ORDER — BISACODYL 10 MG RE SUPP: 10.0000 mg | Freq: Every day | RECTAL | Status: DC | PRN

## 2020-01-10 MED ORDER — LIDOCAINE 2% (20 MG/ML) 5 ML SYRINGE
INTRAMUSCULAR | Status: DC | PRN
  Administered 2020-01-10: 60 mg via INTRAVENOUS

## 2020-01-10 MED ORDER — FUROSEMIDE 20 MG PO TABS
20.0000 mg | ORAL_TABLET | Freq: Every day | ORAL | Status: DC
  Administered 2020-01-12 – 2020-01-13 (×2): 20 mg via ORAL
  Filled 2020-01-10 (×3): qty 1

## 2020-01-10 MED ORDER — SODIUM CHLORIDE 0.9 % IV SOLN: INTRAVENOUS | Status: DC

## 2020-01-10 MED ORDER — WATER FOR IRRIGATION, STERILE IR SOLN
Status: DC | PRN
  Administered 2020-01-10: 2000 mL

## 2020-01-10 MED ORDER — ONDANSETRON HCL 4 MG PO TABS: 4.0000 mg | ORAL_TABLET | Freq: Four times a day (QID) | ORAL | Status: DC | PRN

## 2020-01-10 MED ORDER — CEFAZOLIN SODIUM-DEXTROSE 2-4 GM/100ML-% IV SOLN
2.0000 g | INTRAVENOUS | Status: AC
  Administered 2020-01-10: 2 g via INTRAVENOUS
  Filled 2020-01-10: qty 100

## 2020-01-10 MED ORDER — ONDANSETRON HCL 4 MG/2ML IJ SOLN: 4.0000 mg | Freq: Four times a day (QID) | INTRAMUSCULAR | Status: DC | PRN

## 2020-01-10 MED ORDER — SPIRONOLACTONE 12.5 MG HALF TABLET
12.5000 mg | ORAL_TABLET | Freq: Every day | ORAL | Status: DC
  Administered 2020-01-12 – 2020-01-13 (×2): 12.5 mg via ORAL
  Filled 2020-01-10 (×3): qty 1

## 2020-01-10 MED ORDER — PHENYLEPHRINE HCL-NACL 10-0.9 MG/250ML-% IV SOLN
INTRAVENOUS | Status: DC | PRN
  Administered 2020-01-10: 50 ug/min via INTRAVENOUS

## 2020-01-10 MED ORDER — DEXAMETHASONE SODIUM PHOSPHATE 10 MG/ML IJ SOLN
8.0000 mg | Freq: Once | INTRAMUSCULAR | Status: AC
  Administered 2020-01-10: 8 mg via INTRAVENOUS

## 2020-01-10 MED ORDER — METHOCARBAMOL 500 MG PO TABS
500.0000 mg | ORAL_TABLET | Freq: Four times a day (QID) | ORAL | Status: DC | PRN
  Administered 2020-01-10 – 2020-01-12 (×3): 500 mg via ORAL
  Filled 2020-01-10 (×4): qty 1

## 2020-01-10 MED ORDER — ATORVASTATIN CALCIUM 40 MG PO TABS
40.0000 mg | ORAL_TABLET | Freq: Every day | ORAL | Status: DC
  Administered 2020-01-10 – 2020-01-12 (×3): 40 mg via ORAL
  Filled 2020-01-10 (×3): qty 1

## 2020-01-10 MED ORDER — ROCURONIUM BROMIDE 50 MG/5ML IV SOSY
PREFILLED_SYRINGE | INTRAVENOUS | Status: DC | PRN
  Administered 2020-01-10: 60 mg via INTRAVENOUS

## 2020-01-10 MED ORDER — POLYETHYLENE GLYCOL 3350 17 G PO PACK
17.0000 g | PACK | Freq: Every day | ORAL | Status: DC | PRN
  Filled 2020-01-10: qty 1

## 2020-01-10 MED ORDER — PHENOL 1.4 % MT LIQD: 1.0000 | OROMUCOSAL | Status: DC | PRN

## 2020-01-10 MED ORDER — ACETAMINOPHEN 10 MG/ML IV SOLN
1000.0000 mg | Freq: Four times a day (QID) | INTRAVENOUS | Status: DC
  Administered 2020-01-10: 1000 mg via INTRAVENOUS
  Filled 2020-01-10: qty 100

## 2020-01-10 MED ORDER — MENTHOL 3 MG MT LOZG: 1.0000 | LOZENGE | OROMUCOSAL | Status: DC | PRN

## 2020-01-10 MED ORDER — PHENYLEPHRINE 40 MCG/ML (10ML) SYRINGE FOR IV PUSH (FOR BLOOD PRESSURE SUPPORT)
PREFILLED_SYRINGE | INTRAVENOUS | Status: DC | PRN
  Administered 2020-01-10: 120 ug via INTRAVENOUS

## 2020-01-10 MED ORDER — ALBUMIN HUMAN 5 % IV SOLN: INTRAVENOUS | Status: DC | PRN

## 2020-01-10 MED ORDER — CARVEDILOL 3.125 MG PO TABS
3.1250 mg | ORAL_TABLET | Freq: Two times a day (BID) | ORAL | Status: DC
  Administered 2020-01-10 – 2020-01-13 (×6): 3.125 mg via ORAL
  Filled 2020-01-10 (×6): qty 1

## 2020-01-10 MED ORDER — DEXAMETHASONE SODIUM PHOSPHATE 10 MG/ML IJ SOLN
INTRAMUSCULAR | Status: AC
  Filled 2020-01-10: qty 1

## 2020-01-10 MED ORDER — FENTANYL CITRATE (PF) 100 MCG/2ML IJ SOLN
25.0000 ug | INTRAMUSCULAR | Status: DC | PRN
  Administered 2020-01-10: 10:00:00 25 ug via INTRAVENOUS
  Administered 2020-01-10 (×2): 37.5 ug via INTRAVENOUS
  Administered 2020-01-10 (×2): 25 ug via INTRAVENOUS

## 2020-01-10 MED ORDER — CHLORHEXIDINE GLUCONATE 4 % EX LIQD: 60.0000 mL | Freq: Once | CUTANEOUS | Status: DC

## 2020-01-10 MED ORDER — BUPIVACAINE HCL (PF) 0.25 % IJ SOLN
INTRAMUSCULAR | Status: AC
  Filled 2020-01-10: qty 30

## 2020-01-10 MED ORDER — METHOCARBAMOL 500 MG IVPB - SIMPLE MED
INTRAVENOUS | Status: AC
  Filled 2020-01-10: qty 50

## 2020-01-10 MED ORDER — HYDROCODONE-ACETAMINOPHEN 5-325 MG PO TABS
1.0000 | ORAL_TABLET | ORAL | Status: DC | PRN
  Administered 2020-01-10 (×2): 1 via ORAL
  Administered 2020-01-11 – 2020-01-13 (×6): 2 via ORAL
  Filled 2020-01-10 (×5): qty 2
  Filled 2020-01-10: qty 1
  Filled 2020-01-10: qty 2
  Filled 2020-01-10: qty 1

## 2020-01-10 MED ORDER — EPHEDRINE SULFATE-NACL 50-0.9 MG/10ML-% IV SOSY
PREFILLED_SYRINGE | INTRAVENOUS | Status: DC | PRN
  Administered 2020-01-10: 5 mg via INTRAVENOUS
  Administered 2020-01-10: 10 mg via INTRAVENOUS

## 2020-01-10 MED ORDER — MAGNESIUM CITRATE PO SOLN: 1.0000 | Freq: Once | ORAL | Status: DC | PRN

## 2020-01-10 MED ORDER — SUGAMMADEX SODIUM 200 MG/2ML IV SOLN
INTRAVENOUS | Status: DC | PRN
  Administered 2020-01-10: 200 mg via INTRAVENOUS

## 2020-01-10 MED ORDER — MORPHINE SULFATE (PF) 2 MG/ML IV SOLN: 0.5000 mg | INTRAVENOUS | Status: DC | PRN

## 2020-01-10 MED ORDER — FENTANYL CITRATE (PF) 100 MCG/2ML IJ SOLN
INTRAMUSCULAR | Status: DC | PRN
  Administered 2020-01-10 (×6): 25 ug via INTRAVENOUS

## 2020-01-10 MED ORDER — LIDOCAINE 2% (20 MG/ML) 5 ML SYRINGE
INTRAMUSCULAR | Status: AC
  Filled 2020-01-10: qty 5

## 2020-01-10 MED ORDER — APIXABAN 2.5 MG PO TABS
2.5000 mg | ORAL_TABLET | Freq: Two times a day (BID) | ORAL | Status: DC
  Administered 2020-01-11 – 2020-01-13 (×5): 2.5 mg via ORAL
  Filled 2020-01-10 (×5): qty 1

## 2020-01-10 MED ORDER — ROCURONIUM BROMIDE 10 MG/ML (PF) SYRINGE
PREFILLED_SYRINGE | INTRAVENOUS | Status: AC
  Filled 2020-01-10: qty 10

## 2020-01-10 MED ORDER — TRANEXAMIC ACID-NACL 1000-0.7 MG/100ML-% IV SOLN
1000.0000 mg | INTRAVENOUS | Status: AC
  Administered 2020-01-10: 1000 mg via INTRAVENOUS
  Filled 2020-01-10: qty 100

## 2020-01-10 MED ORDER — CEFAZOLIN SODIUM-DEXTROSE 2-4 GM/100ML-% IV SOLN
2.0000 g | Freq: Four times a day (QID) | INTRAVENOUS | Status: AC
  Administered 2020-01-10 (×2): 2 g via INTRAVENOUS
  Filled 2020-01-10 (×2): qty 100

## 2020-01-10 MED ORDER — METHOCARBAMOL 500 MG IVPB - SIMPLE MED
500.0000 mg | Freq: Four times a day (QID) | INTRAVENOUS | Status: DC | PRN
  Administered 2020-01-10: 09:00:00 500 mg via INTRAVENOUS
  Filled 2020-01-10: qty 50

## 2020-01-10 MED ORDER — METOCLOPRAMIDE HCL 5 MG/ML IJ SOLN: 5.0000 mg | Freq: Three times a day (TID) | INTRAMUSCULAR | Status: DC | PRN

## 2020-01-10 MED ORDER — METOCLOPRAMIDE HCL 5 MG PO TABS: 5.0000 mg | ORAL_TABLET | Freq: Three times a day (TID) | ORAL | Status: DC | PRN

## 2020-01-10 MED ORDER — ONDANSETRON HCL 4 MG/2ML IJ SOLN
INTRAMUSCULAR | Status: DC | PRN
  Administered 2020-01-10: 4 mg via INTRAVENOUS

## 2020-01-10 MED ORDER — LACTATED RINGERS IV SOLN: INTRAVENOUS | Status: DC

## 2020-01-10 SURGICAL SUPPLY — 47 items
BAG DECANTER FOR FLEXI CONT (MISCELLANEOUS) IMPLANT
BAG ZIPLOCK 12X15 (MISCELLANEOUS) IMPLANT
BLADE SAG 18X100X1.27 (BLADE) ×2 IMPLANT
COVER PERINEAL POST (MISCELLANEOUS) ×2 IMPLANT
COVER SURGICAL LIGHT HANDLE (MISCELLANEOUS) ×2 IMPLANT
COVER WAND RF STERILE (DRAPES) IMPLANT
CUP ACETBLR 54 OD PINNACLE (Hips) ×1 IMPLANT
DECANTER SPIKE VIAL GLASS SM (MISCELLANEOUS) ×2 IMPLANT
DRAPE STERI IOBAN 125X83 (DRAPES) ×2 IMPLANT
DRAPE U-SHAPE 47X51 STRL (DRAPES) ×4 IMPLANT
DRSG ADAPTIC 3X8 NADH LF (GAUZE/BANDAGES/DRESSINGS) ×2 IMPLANT
DRSG AQUACEL AG ADV 3.5X10 (GAUZE/BANDAGES/DRESSINGS) ×2 IMPLANT
DURAPREP 26ML APPLICATOR (WOUND CARE) ×2 IMPLANT
ELECT REM PT RETURN 15FT ADLT (MISCELLANEOUS) ×2 IMPLANT
EVACUATOR 1/8 PVC DRAIN (DRAIN) ×1 IMPLANT
GLOVE BIO SURGEON STRL SZ 6 (GLOVE) IMPLANT
GLOVE BIO SURGEON STRL SZ7 (GLOVE) IMPLANT
GLOVE BIO SURGEON STRL SZ8 (GLOVE) ×2 IMPLANT
GLOVE BIOGEL PI IND STRL 6.5 (GLOVE) IMPLANT
GLOVE BIOGEL PI IND STRL 7.0 (GLOVE) IMPLANT
GLOVE BIOGEL PI IND STRL 8 (GLOVE) ×1 IMPLANT
GLOVE BIOGEL PI INDICATOR 6.5 (GLOVE)
GLOVE BIOGEL PI INDICATOR 7.0 (GLOVE)
GLOVE BIOGEL PI INDICATOR 8 (GLOVE) ×1
GOWN STRL REUS W/TWL LRG LVL3 (GOWN DISPOSABLE) ×2 IMPLANT
GOWN STRL REUS W/TWL XL LVL3 (GOWN DISPOSABLE) IMPLANT
HEAD M SROM 36MM PLUS 1.5 (Hips) IMPLANT
HOLDER FOLEY CATH W/STRAP (MISCELLANEOUS) ×1 IMPLANT
KIT TURNOVER KIT A (KITS) IMPLANT
LINER MARATHON NEUT +4X54X36 (Hips) ×1 IMPLANT
MANIFOLD NEPTUNE II (INSTRUMENTS) ×2 IMPLANT
PACK ANTERIOR HIP CUSTOM (KITS) ×2 IMPLANT
PENCIL SMOKE EVACUATOR COATED (MISCELLANEOUS) ×2 IMPLANT
SCREW 6.5MMX25MM (Screw) ×1 IMPLANT
SCREW PINN CAN 6.5X20 (Screw) ×1 IMPLANT
SROM M HEAD 36MM PLUS 1.5 (Hips) ×2 IMPLANT
STEM FEMORAL SZ 6MM STD ACTIS (Stem) ×1 IMPLANT
STRIP CLOSURE SKIN 1/2X4 (GAUZE/BANDAGES/DRESSINGS) ×2 IMPLANT
SUT ETHIBOND NAB CT1 #1 30IN (SUTURE) ×2 IMPLANT
SUT MNCRL AB 4-0 PS2 18 (SUTURE) ×2 IMPLANT
SUT STRATAFIX 0 PDS 27 VIOLET (SUTURE) ×2
SUT VIC AB 2-0 CT1 27 (SUTURE) ×4
SUT VIC AB 2-0 CT1 TAPERPNT 27 (SUTURE) ×2 IMPLANT
SUTURE STRATFX 0 PDS 27 VIOLET (SUTURE) ×1 IMPLANT
SYR 50ML LL SCALE MARK (SYRINGE) IMPLANT
TRAY FOLEY MTR SLVR 16FR STAT (SET/KITS/TRAYS/PACK) ×1 IMPLANT
YANKAUER SUCT BULB TIP 10FT TU (MISCELLANEOUS) ×2 IMPLANT

## 2020-01-10 NOTE — Discharge Instructions (Addendum)
Parker Aluisio, MD Total Joint Specialist [facility] [address] [place], [place] [address] [phone]  ANTERIOR APPROACH TOTAL HIP REPLACEMENT POSTOPERATIVE DIRECTIONS     Hip Rehabilitation, Guidelines Following Surgery  The results of a hip operation are greatly improved after range of motion and muscle strengthening exercises. Follow all safety measures which are given to protect your hip. If any of these exercises cause increased pain or swelling in your joint, decrease the amount until you are comfortable again. Then slowly increase the exercises. Call your caregiver if you have problems or questions.   HOME CARE INSTRUCTIONS  . Remove items at home which could result in a fall. This includes throw rugs or furniture in walking pathways.   ICE to the affected hip as frequently as 20-30 minutes an hour and then as needed for pain and swelling. Continue to use ice on the hip for pain and swelling from surgery. You may notice swelling that will progress down to the foot and ankle. This is normal after surgery. Elevate the leg when you are not up walking on it.    Continue to use the breathing machine which will help keep your temperature down.  It is common for your temperature to cycle up and down following surgery, especially at night when you are not up moving around and exerting yourself.  The breathing machine keeps your lungs expanded and your temperature down.  DIET You may resume your previous home diet once your are discharged from the hospital.  DRESSING / WOUND CARE / SHOWERING . You have an adhesive waterproof bandage over the incision. Leave this in place until your first follow-up appointment. Once you remove this you will not need to place another bandage.  . You may begin showering 3 days following surgery, but do not submerge the incision under water.  ACTIVITY . For the first 3-5 days, it is important to rest and keep the operative  leg elevated. You should, as a general rule, rest for 50 minutes and walk/stretch for 10 minutes per hour. After 5 days, you may slowly increase activity as tolerated.  . Perform the exercises you were provided twice a day for about 15-20 minutes each session. Begin these 2 days following surgery. . Walk with your walker as instructed. Use the walker until you are comfortable transitioning to a cane. Walk with the cane in the opposite hand of the operative leg. You may discontinue the cane once you are comfortable and walking steadily. . Avoid periods of inactivity such as sitting longer than an hour when not asleep. This helps prevent blood clots.  . Do not drive a car for 6 weeks or until released by your surgeon.  . Do not drive while taking narcotics.  TED HOSE STOCKINGS Wear the elastic stockings on both legs for three weeks following surgery during the day. You may remove them at night while sleeping.  WEIGHT BEARING Weight bearing as tolerated with assist device (walker, cane, etc) as directed, use it as long as suggested by your surgeon or therapist, typically at least 4-6 weeks.  POSTOPERATIVE CONSTIPATION PROTOCOL Constipation - defined medically as fewer than three stools per week and severe constipation as less than one stool per week.  One of the most common issues patients have following surgery is constipation.  Even if you have a regular bowel pattern at home, your normal regimen is likely to be disrupted due to multiple reasons following surgery.  Combination of anesthesia, postoperative narcotics,   change in appetite and fluid intake all can affect your bowels.  In order to avoid complications following surgery, here are some recommendations in order to help you during your recovery period.  . Colace (docusate) - Pick up an over-the-counter form of Colace or another stool softener and take twice a day as long as you are requiring postoperative pain medications.  Take with a full  glass of water daily.  If you experience loose stools or diarrhea, hold the colace until you stool forms back up.  If your symptoms do not get better within 1 week or if they get worse, check with your doctor. . Dulcolax (bisacodyl) - Pick up over-the-counter and take as directed by the product packaging as needed to assist with the movement of your bowels.  Take with a full glass of water.  Use this product as needed if not relieved by Colace only.  . MiraLax (polyethylene glycol) - Pick up over-the-counter to have on hand.  MiraLax is a solution that will increase the amount of water in your bowels to assist with bowel movements.  Take as directed and can mix with a glass of water, juice, soda, coffee, or tea.  Take if you go more than two days without a movement.Do not use MiraLax more than once per day. Call your doctor if you are still constipated or irregular after using this medication for 7 days in a row.  If you continue to have problems with postoperative constipation, please contact the office for further assistance and recommendations.  If you experience "the worst abdominal pain ever" or develop nausea or vomiting, please contact the office immediatly for further recommendations for treatment.  ITCHING  If you experience itching with your medications, try taking only a single pain pill, or even half a pain pill at a time.  You can also use Benadryl over the counter for itching or also to help with sleep.   MEDICATIONS See your medication summary on the "After Visit Summary" that the nursing staff will review with you prior to discharge.  You may have some home medications which will be placed on hold until you complete the course of blood thinner medication.  It is important for you to complete the blood thinner medication as prescribed by your surgeon.  Continue your approved medications as instructed at time of discharge.  PRECAUTIONS If you experience chest pain or shortness of breath -  call 911 immediately for transfer to the hospital emergency department.  If you develop a fever greater that 101 F, purulent drainage from wound, increased redness or drainage from wound, foul odor from the wound/dressing, or calf pain - CONTACT YOUR SURGEON.                                                   FOLLOW-UP APPOINTMENTS Make sure you keep all of your appointments after your operation with your surgeon and caregivers. You should call the office at the above phone number and make an appointment for approximately two weeks after the date of your surgery or on the date instructed by your surgeon outlined in the "After Visit Summary".  RANGE OF MOTION AND STRENGTHENING EXERCISES  These exercises are designed to help you keep full movement of your hip joint. Follow your caregiver's or physical therapist's instructions. Perform all exercises about fifteen times, three   times per day or as directed. Exercise both hips, even if you have had only one joint replacement. These exercises can be done on a training (exercise) mat, on the floor, on a table or on a bed. Use whatever works the best and is most comfortable for you. Use music or television while you are exercising so that the exercises are a pleasant break in your day. This will make your life better with the exercises acting as a break in routine you can look forward to.  . Lying on your back, slowly slide your foot toward your buttocks, raising your knee up off the floor. Then slowly slide your foot back down until your leg is straight again.  . Lying on your back spread your legs as far apart as you can without causing discomfort.  . Lying on your side, raise your upper leg and foot straight up from the floor as far as is comfortable. Slowly lower the leg and repeat.  . Lying on your back, tighten up the muscle in the front of your thigh (quadriceps muscles). You can do this by keeping your leg straight and trying to raise your heel off the  floor. This helps strengthen the largest muscle supporting your knee.  . Lying on your back, tighten up the muscles of your buttocks both with the legs straight and with the knee bent at a comfortable angle while keeping your heel on the floor.   IF YOU ARE TRANSFERRED TO A SKILLED REHAB FACILITY If the patient is transferred to a skilled rehab facility following release from the hospital, a list of the current medications will be sent to the facility for the patient to continue.  When discharged from the skilled rehab facility, please have the facility set up the patient's [place] prior to being released. Also, the skilled facility will be responsible for providing the patient with their medications at time of release from the facility to include their pain medication, the muscle relaxants, and their blood thinner medication. If the patient is still at the rehab facility at time of the two week follow up appointment, the skilled rehab facility will also need to assist the patient in arranging follow up appointment in our office and any transportation needs.  MAKE SURE YOU:  . Understand these instructions.  . Get help right away if you are not doing well or get worse.    Pick up stool softner and laxative for home use following surgery while on pain medications. Do not submerge incision under water. Please use good hand washing techniques while changing dressing each day. May shower starting three days after surgery. Please use a clean towel to pat the incision dry following showers. Continue to use ice for pain and swelling after surgery. Do not use any lotions or creams on the incision until instructed by your surgeon.  Information on my medicine - ELIQUIS (apixaban)  This medication education was reviewed with me or my healthcare representative as part of my discharge preparation.  The pharmacist that spoke with me during my hospital stay was:    Why was Eliquis  prescribed for you? Eliquis was prescribed for you to reduce the risk of blood clots forming after orthopedic surgery.    What do You need to know about Eliquis? Take your Eliquis TWICE DAILY - one tablet in the morning and one tablet in the evening with or without food.  It would be best to take the dose about the same  time each day.  If you have difficulty swallowing the tablet whole please discuss with your pharmacist how to take the medication safely.  Take Eliquis exactly as prescribed by your doctor and DO NOT stop taking Eliquis without talking to the doctor who prescribed the medication.  Stopping without other medication to take the place of Eliquis may increase your risk of developing a clot.  After discharge, you should have regular check-up appointments with your healthcare provider that is prescribing your Eliquis.  What do you do if you miss a dose? If a dose of ELIQUIS is not taken at the scheduled time, take it as soon as possible on the same day and twice-daily administration should be resumed.  The dose should not be doubled to make up for a missed dose.  Do not take more than one tablet of ELIQUIS at the same time.  Important Safety Information A possible side effect of Eliquis is bleeding. You should call your healthcare provider right away if you experience any of the following: ? Bleeding from an injury or your nose that does not stop. ? Unusual colored urine (red or dark brown) or unusual colored stools (red or black). ? Unusual bruising for unknown reasons. ? A serious fall or if you hit your head (even if there is no bleeding).  Some medicines may interact with Eliquis and might increase your risk of bleeding or clotting while on Eliquis. To help avoid this, consult your healthcare provider or pharmacist prior to using any new prescription or non-prescription medications, including herbals, vitamins, non-steroidal anti-inflammatory drugs (NSAIDs) and  supplements.  This website has more information on Eliquis (apixaban): http://www.eliquis.com/eliquis/home

## 2020-01-10 NOTE — Transfer of Care (Signed)
Immediate Anesthesia Transfer of Care Note  Patient: Parker Odonnell.  Procedure(s) Performed: TOTAL HIP ARTHROPLASTY ANTERIOR APPROACH (Left Hip)  Patient Location: PACU  Anesthesia Type:General  Level of Consciousness: awake, alert  and oriented  Airway & Oxygen Therapy: Patient Spontanous Breathing and Patient connected to face mask oxygen  Post-op Assessment: Report given to RN and Post -op Vital signs reviewed and stable  Post vital signs: Reviewed and stable  Last Vitals:  Vitals Value Taken Time  BP 144/64 01/10/20 0907  Temp    Pulse 71 01/10/20 0909  Resp 19 01/10/20 0909  SpO2 100 % 01/10/20 0909  Vitals shown include unvalidated device data.  Last Pain:  Vitals:   01/10/20 0605  TempSrc: Oral  PainSc:          Complications: No apparent anesthesia complications

## 2020-01-10 NOTE — Anesthesia Postprocedure Evaluation (Signed)
Anesthesia Post Note  Patient: Parker Odonnell.  Procedure(s) Performed: TOTAL HIP ARTHROPLASTY ANTERIOR APPROACH (Left Hip)     Patient location during evaluation: PACU Anesthesia Type: General Level of consciousness: awake Pain management: pain level controlled Vital Signs Assessment: post-procedure vital signs reviewed and stable Cardiovascular status: stable Postop Assessment: no apparent nausea or vomiting Anesthetic complications: no    Last Vitals:  Vitals:   01/10/20 1831 01/10/20 2225  BP:  138/67  Pulse: 88 85  Resp:  16  Temp:  36.8 C  SpO2:  100%    Last Pain:  Vitals:   01/10/20 2225  TempSrc: Oral  PainSc:                  Parker Odonnell

## 2020-01-10 NOTE — Progress Notes (Signed)
Physical Therapy Evaluation Patient Details Name: Parker Odonnell. MRN: [id number] DOB: December 31, 1931 Today's Date: 01/10/2020   Parker Sink. is a 86 y.o. male POD 0 s/p Lt THA. Patient reports modified independence with RW for mobility at baseline. Patient is now limited by functional impairments (see PT problem list below) and requires min-mod assist for transfers and gait with RW. Patient was limited by pain today and was able to take several short steps to transfer to recliner from EOB with RW and mod assist. Patient instructed in exercise to facilitate circulation. Patient will benefit from continued skilled PT interventions to address impairments and progress towards PLOF. Acute PT will follow to progress mobility and stair training in preparation for safe discharge home.     01/10/20 1400  PT Visit Information  Last PT Received On 01/10/20  Assistance Needed +1  History of Present Illness Patient is 86 y.o. male s/p Lt THA anterior approach on 01/10/20 with PMH significant for HTN, GERD, dyslipidemia, CHF, ASCVD, coronary atherosclerosis, OA.  Precautions  Precautions Fall  Restrictions  Weight Bearing Restrictions No  Home Living  Family/patient expects to be discharged to: Assisted living  Living Arrangements Alone  Available Help at Discharge Family;Available 24 hours/day  Type of Home Apartment  Home Access Level entry  Home Layout One level  Bathroom Shower/Tub Walk-in shower  Bathroom Toilet Handicapped height  Bathroom Accessibility Yes  Home Equipment Walker - 2 wheels;Cane - single point;Shower seat;Grab bars - tub/shower;Grab bars - toilet  Additional Comments pt has assistance from staff for meals and his daughter assists him with medication management.  Prior Function  Level of Independence Needs assistance;Independent with assistive device(s)  Gait / Transfers Assistance Needed pt has been using RW for mobility in apartment.   ADL's / Homemaking Assistance  Needed staff assists with meals and daughter helps with meds. pt is independent with showering and bathing.  Communication  Communication No difficulties  Pain Assessment  Pain Assessment Faces  Faces Pain Scale 4  Pain Location Lt hip  Pain Descriptors / Indicators Aching;Guarding  Pain Intervention(s) Limited activity within patient's tolerance;Monitored during session;Repositioned;Ice applied  Cognition  Arousal/Alertness Awake/alert  Behavior During Therapy WFL for tasks assessed/performed  Overall Cognitive Status Within Functional Limits for tasks assessed  Upper Extremity Assessment  Upper Extremity Assessment Generalized weakness  Lower Extremity Assessment  Lower Extremity Assessment Generalized weakness  Cervical / Trunk Assessment  Cervical / Trunk Assessment Kyphotic  Bed Mobility  Overal bed mobility Needs Assistance  Bed Mobility Supine to Sit  Supine to sit Min assist;HOB elevated  General bed mobility comments pt requires extra time requesting to "go slow" throughout mobility. cues required for use of bed rail to pivot and assist to support Lt LE while lowering off EOB.  Transfers  Overall transfer level Needs assistance  Equipment used Rolling walker (2 wheeled)  Transfers Sit to/from [facility]  Sit to Stand From elevated surface;Mod assist  Stand pivot transfers Mod assist  General transfer comment cues for hand placement/technique for power up. assist required to initiate and complete power up to rise. pt avoiding WB on Lt LE due to pain. Pt able to reduce weight bearing in Lt LE with bil UE support on RW and despite cues maintained TDWB during small side steps to complete stand step transfer to recliner.   Ambulation/Gait  Ambulation/Gait assistance Mod assist  Gait Distance (Feet) 4 Feet  Assistive device Rolling walker (2 wheeled)  Gait Pattern/deviations Step-to  pattern;Decreased stride length;Decreased stance time - left;Decreased step  length - left;Decreased weight shift to left;Trunk flexed  General Gait Details small side steps for stand step transfer bed>chair.  Gait velocity slow  Exercises  Exercises Total Joint  Total Joint Exercises  Ankle Circles/Pumps AROM;10 reps;Both;Seated  PT - End of Session  Equipment Utilized During Treatment Gait belt  Activity Tolerance Patient tolerated treatment well;Patient limited by pain  Patient left in chair;with call bell/phone within reach;with chair alarm set  Nurse Communication Mobility status  PT Assessment  PT Recommendation/Assessment Patient needs continued PT services  PT Visit Diagnosis Muscle weakness (generalized) (M62.81);Difficulty in walking, not elsewhere classified (R26.2)  PT Problem List Decreased strength;Decreased range of motion;Decreased activity tolerance;Decreased balance;Decreased mobility;Decreased knowledge of use of DME;Pain  PT Plan  PT Frequency (ACUTE ONLY) 7X/week  PT Treatment/Interventions (ACUTE ONLY) DME instruction;Gait training;Stair training;Functional mobility training;Therapeutic activities;Therapeutic exercise;Balance training;Patient/family education  AM-PAC PT "6 Clicks" Mobility Outcome Measure (Version 2)  Help needed turning from your back to your side while in a flat bed without using bedrails? 3  Help needed moving from lying on your back to sitting on the side of a flat bed without using bedrails? 3  Help needed moving to and from a bed to a chair (including a wheelchair)? 2  Help needed standing up from a chair using your arms (e.g., wheelchair or bedside chair)? 2  Help needed to walk in hospital room? 2  Help needed climbing 3-5 steps with a railing?  1  6 Click Score 13  Consider Recommendation of Discharge To: CIR/SNF/LTACH  PT Recommendation  Follow Up Recommendations Home health PT;Follow surgeon's recommendation for DC plan and follow-up therapies  PT equipment None recommended by PT  Individuals Consulted   Consulted and Agree with Results and Recommendations Patient  Acute Rehab PT Goals  Patient Stated Goal to get back to ALF and work with therapy  PT Goal Formulation With patient  Time For Goal Achievement 01/17/20  Potential to Achieve Goals Good  PT Time Calculation  PT Start Time (ACUTE ONLY) 1441  PT Stop Time (ACUTE ONLY) 1458  PT Time Calculation (min) (ACUTE ONLY) 17 min  PT General Charges  $$ ACUTE PT VISIT 1 Visit  PT Evaluation  $PT Eval Low Complexity 1 Low  Written Expression  Dominant Hand Right   Verner Mould, DPT Physical Therapist with [facility] [phone]  01/10/2020 6:43 PM

## 2020-01-10 NOTE — Interval H&P Note (Signed)
History and Physical Interval Note:  01/10/2020 6:50 AM  McCurtain Sink.  has presented today for surgery, with the diagnosis of Left hip osteoarthritis.  The various methods of treatment have been discussed with the patient and family. After consideration of risks, benefits and other options for treatment, the patient has consented to  Procedure(s) with comments: Wright (Left) - 116min as a surgical intervention.  The patient's history has been reviewed, patient examined, no change in status, stable for surgery.  I have reviewed the patient's chart and labs.  Questions were answered to the patient's satisfaction.     Pilar Plate Raylan Troiani

## 2020-01-10 NOTE — Anesthesia Procedure Notes (Signed)
Procedure Name: Intubation Date/Time: 01/10/2020 7:20 AM Performed by: Maxwell Caul, CRNA Pre-anesthesia Checklist: Patient identified, Emergency Drugs available, Suction available and Patient being monitored Patient Re-evaluated:Patient Re-evaluated prior to induction Oxygen Delivery Method: Circle system utilized Preoxygenation: Pre-oxygenation with 100% oxygen Induction Type: IV induction Ventilation: Mask ventilation without difficulty Laryngoscope Size: Mac and 4 Grade View: Grade I Tube type: Oral Tube size: 7.5 mm Number of attempts: 1 Airway Equipment and Method: Stylet Placement Confirmation: ETT inserted through vocal cords under direct vision,  positive ETCO2 and breath sounds checked- equal and bilateral Secured at: 21 cm Tube secured with: Tape Dental Injury: Teeth and Oropharynx as per pre-operative assessment

## 2020-01-10 NOTE — Op Note (Signed)
OPERATIVE REPORT- TOTAL HIP ARTHROPLASTY   PREOPERATIVE DIAGNOSIS: Osteoarthritis of the Left hip.   POSTOPERATIVE DIAGNOSIS: Osteoarthritis of the Left  hip.   PROCEDURE: Left total hip arthroplasty, anterior approach.   SURGEON: Gaynelle Arabian, MD   ASSISTANT: Theresa Duty, PA-C  ANESTHESIA:  General  ESTIMATED BLOOD LOSS:-850 mL    DRAINS: Hemovac x1.   COMPLICATIONS: None   CONDITION: PACU - hemodynamically stable.   BRIEF CLINICAL NOTE: Parker Odonnell. is a 86 y.o. male who has advanced end-  stage arthritis of their Left  hip with progressively worsening pain and  dysfunction.The patient has failed nonoperative management and presents for  total hip arthroplasty.   PROCEDURE IN DETAIL: After successful administration of spinal  anesthetic, the traction boots for the [facility] bed were placed on both  feet and the patient was placed onto the [facility] bed, boots placed into the leg  holders. The Left hip was then isolated from the perineum with plastic  drapes and prepped and draped in the usual sterile fashion. ASIS and  greater trochanter were marked and a oblique incision was made, starting  at about 1 cm lateral and 2 cm distal to the ASIS and coursing towards  the anterior cortex of the femur. The skin was cut with a 10 blade  through subcutaneous tissue to the level of the fascia overlying the  tensor fascia lata muscle. The fascia was then incised in line with the  incision at the junction of the anterior third and posterior 2/3rd. The  muscle was teased off the fascia and then the interval between the TFL  and the rectus was developed. The Hohmann retractor was then placed at  the top of the femoral neck over the capsule. The vessels overlying the  capsule were cauterized and the fat on top of the capsule was removed.  A Hohmann retractor was then placed anterior underneath the rectus  femoris to give exposure to the entire anterior capsule. A T-shaped   capsulotomy was performed. The edges were tagged and the femoral head  was identified.       Osteophytes are removed off the superior acetabulum.  The femoral neck was then cut in situ with an oscillating saw. Traction  was then applied to the left lower extremity utilizing the [facility]  traction. The femoral head was then removed. Retractors were placed  around the acetabulum and then circumferential removal of the labrum was  performed. Osteophytes were also removed. Reaming starts at 47 mm to  medialize and  Increased in 2 mm increments to 53 mm. We reamed in  approximately 40 degrees of abduction, 20 degrees anteversion. A 54 mm  pinnacle acetabular shell was then impacted in anatomic position under  fluoroscopic guidance with excellent purchase. We placed  2 additional dome screws. A 36 mm neutral + 4 marathon liner was then  placed into the acetabular shell.       The femoral lift was then placed along the lateral aspect of the femur  just distal to the vastus ridge. The leg was  externally rotated and capsule  was stripped off the inferior aspect of the femoral neck down to the  level of the lesser trochanter, this was done with electrocautery. The femur was lifted after this was performed. The  leg was then placed in an extended and adducted position essentially delivering the femur. We also removed the capsule superiorly and the piriformis from the piriformis fossa to gain  excellent exposure of the  proximal femur. Rongeur was used to remove some cancellous bone to get  into the lateral portion of the proximal femur for placement of the  initial starter reamer. The starter broaches was placed  the starter broach  and was shown to go down the center of the canal. Broaching  with the Actis system was then performed starting at size 0  coursing  Up to size 6. A size 6 had excellent torsional and rotational  and axial stability. The trial standard offset neck was then placed  with a 36 +  1.5 trial head. The hip was then reduced. We confirmed that  the stem was in the canal both on AP and lateral x-rays. It also has excellent sizing. The hip was reduced with outstanding stability through full extension and full external rotation.. AP pelvis was taken and the leg lengths were measured and found to be equal. Hip was then dislocated again and the femoral head and neck removed. The  femoral broach was removed. Size 6 Actis stem with a standard offset  neck was then impacted into the femur following native anteversion. Has  excellent purchase in the canal. Excellent torsional and rotational and  axial stability. It is confirmed to be in the canal on AP and lateral  fluoroscopic views. The 36 + 1.5 metal head was placed and the hip  reduced with outstanding stability. Again AP pelvis was taken and it  confirmed that the leg lengths were equal. The wound was then copiously  irrigated with saline solution and the capsule reattached and repaired  with Ethibond suture. 30 ml of .25% Bupivicaine was  injected into the capsule and into the edge of the tensor fascia lata as well as subcutaneous tissue. The fascia overlying the tensor fascia lata was then closed with a running #1 V-Loc. Subcu was closed with interrupted 2-0 Vicryl and subcuticular running 4-0 Monocryl. Incision was cleaned  and dried. Steri-Strips and a bulky sterile dressing applied. Hemovac  drain was hooked to suction and then the patient was awakened and transported to  recovery in stable condition.        Please note that a surgical assistant was a medical necessity for this procedure to perform it in a safe and expeditious manner. Assistant was necessary to provide appropriate retraction of vital neurovascular structures and to prevent femoral fracture and allow for anatomic placement of the prosthesis.  Gaynelle Arabian, M.D.

## 2020-01-11 ENCOUNTER — Encounter: Payer: Self-pay | Admitting: *Deleted

## 2020-01-11 LAB — BASIC METABOLIC PANEL
Anion gap: 3 — ABNORMAL LOW (ref 5–15)
BUN: 17 mg/dL (ref 8–23)
CO2: 27 mmol/L (ref 22–32)
Calcium: 8.9 mg/dL (ref 8.9–10.3)
Chloride: 104 mmol/L (ref 98–111)
Creatinine, Ser: 0.81 mg/dL (ref 0.61–1.24)
GFR calc Af Amer: >60 mL/min (ref >60)
GFR calc non Af Amer: >60 mL/min (ref >60)
Glucose, Bld: 138 mg/dL — ABNORMAL HIGH (ref 70–99)
Potassium: 4 mmol/L (ref 3.5–5.1)
Sodium: 134 mmol/L — ABNORMAL LOW (ref 135–145)

## 2020-01-11 LAB — CBC
HCT: 25 % — ABNORMAL LOW (ref 39.0–52.0)
Hemoglobin: 8.3 g/dL — ABNORMAL LOW (ref 13.0–17.0)
MCH: 34.2 pg — ABNORMAL HIGH (ref 26.0–34.0)
MCHC: 33.2 g/dL (ref 30.0–36.0)
MCV: 102.9 fL — ABNORMAL HIGH (ref 80.0–100.0)
Platelets: 95 10*3/uL — ABNORMAL LOW (ref 150–400)
RBC: 2.43 MIL/uL — ABNORMAL LOW (ref 4.22–5.81)
RDW: 13 % (ref 11.5–15.5)
WBC: 9.2 10*3/uL (ref 4.0–10.5)
nRBC: 0 % (ref 0.0–0.2)

## 2020-01-11 LAB — PREPARE RBC (CROSSMATCH)

## 2020-01-11 MED ORDER — SODIUM CHLORIDE 0.9% IV SOLUTION: Freq: Once | INTRAVENOUS | Status: DC

## 2020-01-11 NOTE — Progress Notes (Signed)
Informed by nurse, Wendie Chess, that patient was orthostatic with BP dropping to 86/42 mmHg with standing. Cannot order fluid bolus due to patient's cardiac history. Home medications lasix and aldactone held. Hemoglobin 8.3 from 12.5 preoperatively. Will order 2 units PRBCs with post-transfusion H&H.  Theresa Duty, PA-C Orthopedic Surgery [facility] [place]

## 2020-01-11 NOTE — Progress Notes (Signed)
   Subjective: 1 Day Post-Op Procedure(s) (LRB): TOTAL HIP ARTHROPLASTY ANTERIOR APPROACH (Left) Patient reports pain as mild.   Patient seen in rounds with Dr. Wynelle Link. Patient is well, and has had no acute complaints or problems other than discomfort in the left hip. No acute events overnight. Hemoglobin of 8.3 today, down from 12.5 pre-operatively. Foley catheter removed, positive flatus. Denies CP, SHOB, N/V.  We will continue therapy today.   Objective: Vital signs in last 24 hours: Temp:  [97.4 F (36.3 C)-98.2 F (36.8 C)] 97.8 F (36.6 C) (03/25 0611) Pulse Rate:  [63-88] 84 (03/25 0611) Resp:  [11-22] 14 (03/25 0611) BP: (93-144)/(48-80) 111/63 (03/25 0611) SpO2:  [99 %-100 %] 99 % (03/25 0611) Weight:  [61.9 kg] 61.9 kg (03/24 1831)  Intake/Output from previous day:  Intake/Output Summary (Last 24 hours) at 01/11/2020 0747 Last data filed at 01/11/2020 0611 Gross per 24 hour  Intake 3237.58 ml  Output 2245 ml  Net 992.58 ml     Intake/Output this shift: No intake/output data recorded.  Labs: Recent Labs    01/11/20 0257  HGB 8.3*   Recent Labs    01/11/20 0257  WBC 9.2  RBC 2.43*  HCT 25.0*  PLT 95*   Recent Labs    01/11/20 0257  NA 134*  K 4.0  CL 104  CO2 27  BUN 17  CREATININE 0.81  GLUCOSE 138*  CALCIUM 8.9   No results for input(s): LABPT, INR in the last 72 hours.  Exam: General - Patient is Alert and Oriented Extremity - Neurologically intact Sensation intact distally Intact pulses distally Dorsiflexion/Plantar flexion intact Dressing - dressing C/D/I Motor Function - intact, moving foot and toes well on exam.   Past Medical History:  Diagnosis Date  . Arthritis   . ASCVD (arteriosclerotic cardiovascular disease)    single vessel  . CHF (congestive heart failure) ([place])   . Coronary atherosclerosis of native coronary artery   . Dyslipidemia   . ED (erectile dysfunction)   . GERD (gastroesophageal reflux disease)    hx of    . History of kidney stones   . Hypertension   . Inguinal hernia   . Left bundle branch block    chronic  . Postsurgical percutaneous transluminal coronary angioplasty status     Assessment/Plan: 1 Day Post-Op Procedure(s) (LRB): TOTAL HIP ARTHROPLASTY ANTERIOR APPROACH (Left) Principal Problem:   OA (osteoarthritis) of hip Active Problems:   Primary osteoarthritis of left hip  Estimated body mass index is 20.75 kg/m as calculated from the following:   Height as of this encounter: 5\' 8"  (1.727 m).   Weight as of this encounter: 61.9 kg. Advance diet Up with therapy  DVT Prophylaxis - Eliquis Weight bearing as tolerated. D/C O2 and pulse ox and try on room air. Hemovac pulled without difficulty, will begin therapy.  Plan is to go Home after hospital stay. Hemoglobin of 8.3 today, down from 12.5 pre-operatively. Plan to stay overnight to continue monitoring and working with therapy towards safe discharge home tomorrow. If he becomes symptomatic due to post-operative ABLA, we will consider transfusion. We will avoid excess fluids due to history of CHF.   Griffith Citron, PA-C Orthopedic Surgery [phone] 01/11/2020, 7:47 AM

## 2020-01-11 NOTE — Progress Notes (Signed)
Physical Therapy Treatment Patient Details Name: Parker Odonnell. MRN: [id number] DOB: 1931-11-28 Today's Date: 01/11/2020    History of Present Illness Patient is 86 y.o. male s/p Lt THA anterior approach on 01/10/20 with PMH significant for HTN, GERD, dyslipidemia, CHF, ASCVD, coronary atherosclerosis, OA.    PT Comments    Pt very cooperative but limited this am by dizziness with BP standing 86/42 - RN aware.   Follow Up Recommendations  Home health PT;Follow surgeon's recommendation for DC plan and follow-up therapies     Equipment Recommendations  None recommended by PT    Recommendations for Other Services       Precautions / Restrictions Precautions Precautions: Fall Restrictions Weight Bearing Restrictions: No    Mobility  Bed Mobility Overal bed mobility: Needs Assistance Bed Mobility: Supine to Sit     Supine to sit: Min assist;+2 for physical assistance     General bed mobility comments: Increased time with cues for sequence and use of R LE and UEs to self assist.    Transfers Overall transfer level: Needs assistance Equipment used: Rolling walker (2 wheeled) Transfers: Sit to/from [facility] Sit to Stand: From elevated surface;Mod assist;+2 safety/equipment Stand pivot transfers: Mod assist;+2 safety/equipment       General transfer comment: cues for hand placement/technique for power up. assist required to initiate and complete power up to rise.    Ambulation/Gait Ambulation/Gait assistance: Min assist;Mod assist;+2 physical assistance;+2 safety/equipment Gait Distance (Feet): 3 Feet Assistive device: Rolling walker (2 wheeled) Gait Pattern/deviations: Step-to pattern;Decreased stride length;Decreased stance time - left;Decreased step length - left;Decreased weight shift to left;Trunk flexed Gait velocity: slow   General Gait Details: cues for sequence, posture and position from RW; Distance ltd by dizziness - BP  86/42   Stairs             Wheelchair Mobility    Modified Rankin (Stroke Patients Only)       Balance Overall balance assessment: Needs assistance Sitting-balance support: Feet supported Sitting balance-Leahy Scale: Fair     Standing balance support: Bilateral upper extremity supported Standing balance-Leahy Scale: Poor                              Cognition Arousal/Alertness: Awake/alert Behavior During Therapy: WFL for tasks assessed/performed Overall Cognitive Status: Within Functional Limits for tasks assessed                                        Exercises Total Joint Exercises Ankle Circles/Pumps: AROM;10 reps;Both;Seated Quad Sets: AROM;Both;10 reps;Supine Heel Slides: AAROM;Left;15 reps;Supine Hip ABduction/ADduction: AAROM;Left;15 reps;Supine    General Comments        Pertinent Vitals/Pain Pain Assessment: Faces Faces Pain Scale: Hurts little more Pain Location: Lt hip Pain Descriptors / Indicators: Aching;Guarding Pain Intervention(s): Limited activity within patient's tolerance;Monitored during session;Premedicated before session;Ice applied    Home Living                      Prior Function            PT Goals (current goals can now be found in the care plan section) Acute Rehab PT Goals Patient Stated Goal: to get back to ALF and work with therapy PT Goal Formulation: With patient Time For Goal Achievement: 01/17/20 Potential to Achieve Goals: Good Progress towards  PT goals: Progressing toward goals    Frequency    7X/week      PT Plan Current plan remains appropriate    Co-evaluation              AM-PAC PT "6 Clicks" Mobility   Outcome Measure  Help needed turning from your back to your side while in a flat bed without using bedrails?: A Little Help needed moving from lying on your back to sitting on the side of a flat bed without using bedrails?: A Little Help needed moving  to and from a bed to a chair (including a wheelchair)?: A Lot Help needed standing up from a chair using your arms (e.g., wheelchair or bedside chair)?: A Lot Help needed to walk in hospital room?: A Lot Help needed climbing 3-5 steps with a railing? : Total 6 Click Score: 13    End of Session Equipment Utilized During Treatment: Gait belt Activity Tolerance: Patient tolerated treatment well;Patient limited by pain Patient left: in chair;with call bell/phone within reach;with chair alarm set Nurse Communication: Mobility status PT Visit Diagnosis: Muscle weakness (generalized) (M62.81);Difficulty in walking, not elsewhere classified (R26.2)     Time: [id number] PT Time Calculation (min) (ACUTE ONLY): 26 min  Charges:  $Gait Training: 8-22 mins $Therapeutic Exercise: 8-22 mins                     [place] Pager [phone] Office [phone]    Allyn Bertoni 01/11/2020, 12:45 PM

## 2020-01-11 NOTE — Plan of Care (Signed)

## 2020-01-11 NOTE — Progress Notes (Signed)
Physical Therapy Treatment Patient Details Name: Parker Odonnell. MRN: [id number] DOB: Feb 12, 1932 Today's Date: 01/11/2020    History of Present Illness Patient is 86 y.o. male s/p Lt THA anterior approach on 01/10/20 with PMH significant for HTN, GERD, dyslipidemia, CHF, ASCVD, coronary atherosclerosis, OA.    PT Comments    Pt very cooperative and with no c/o dizziness this pm.  Pt able to ambulate short distance into hall but ltd by fatigue.  Pt to receive blood transfusion this pm.   Follow Up Recommendations  Home health PT;Follow surgeon's recommendation for DC plan and follow-up therapies     Equipment Recommendations  None recommended by PT    Recommendations for Other Services       Precautions / Restrictions Precautions Precautions: Fall Restrictions Weight Bearing Restrictions: No    Mobility  Bed Mobility Overal bed mobility: Needs Assistance Bed Mobility: Sit to Supine     Supine to sit: Min assist;+2 for physical assistance Sit to supine: Min assist;Mod assist   General bed mobility comments: Increased time with cues for sequence and use of R LE and UEs to self assist.    Transfers Overall transfer level: Needs assistance Equipment used: Rolling walker (2 wheeled) Transfers: Sit to/from Stand Sit to Stand: Min assist;Mod assist Stand pivot transfers: Mod assist;+2 safety/equipment       General transfer comment: cues for hand placement/technique for power up. assist required to initiate and complete power up to rise.    Ambulation/Gait Ambulation/Gait assistance: Min assist Gait Distance (Feet): 24 Feet Assistive device: Rolling walker (2 wheeled) Gait Pattern/deviations: Step-to pattern;Decreased stride length;Decreased stance time - left;Decreased step length - left;Decreased weight shift to left;Trunk flexed Gait velocity: slow   General Gait Details: cues for sequence, posture and position from RW; No c/o dizziness   Stairs              Wheelchair Mobility    Modified Rankin (Stroke Patients Only)       Balance Overall balance assessment: Needs assistance Sitting-balance support: Feet supported Sitting balance-Leahy Scale: Fair     Standing balance support: Bilateral upper extremity supported Standing balance-Leahy Scale: Poor                              Cognition Arousal/Alertness: Awake/alert Behavior During Therapy: WFL for tasks assessed/performed Overall Cognitive Status: Within Functional Limits for tasks assessed                                        Exercises Total Joint Exercises Ankle Circles/Pumps: AROM;10 reps;Both;Seated Quad Sets: AROM;Both;10 reps;Supine Heel Slides: AAROM;Left;15 reps;Supine Hip ABduction/ADduction: AAROM;Left;15 reps;Supine    General Comments        Pertinent Vitals/Pain Pain Assessment: 0-10 Pain Score: 4  Faces Pain Scale: Hurts little more Pain Location: Lt hip Pain Descriptors / Indicators: Aching;Guarding Pain Intervention(s): Limited activity within patient's tolerance;Monitored during session;Premedicated before session    Home Living                      Prior Function            PT Goals (current goals can now be found in the care plan section) Acute Rehab PT Goals Patient Stated Goal: to get back to ALF and work with therapy PT Goal Formulation: With patient Time For  Goal Achievement: 01/17/20 Potential to Achieve Goals: Good Progress towards PT goals: Progressing toward goals    Frequency    7X/week      PT Plan Current plan remains appropriate    Co-evaluation              AM-PAC PT "6 Clicks" Mobility   Outcome Measure  Help needed turning from your back to your side while in a flat bed without using bedrails?: A Little Help needed moving from lying on your back to sitting on the side of a flat bed without using bedrails?: A Little Help needed moving to and from a bed to a chair  (including a wheelchair)?: A Lot Help needed standing up from a chair using your arms (e.g., wheelchair or bedside chair)?: A Lot Help needed to walk in hospital room?: A Lot Help needed climbing 3-5 steps with a railing? : Total 6 Click Score: 13    End of Session Equipment Utilized During Treatment: Gait belt Activity Tolerance: Patient tolerated treatment well Patient left: in bed;with call bell/phone within reach;with bed alarm set Nurse Communication: Mobility status PT Visit Diagnosis: Muscle weakness (generalized) (M62.81);Difficulty in walking, not elsewhere classified (R26.2)     Time: [id number] PT Time Calculation (min) (ACUTE ONLY): 17 min  Charges:  $Gait Training: 8-22 mins $Therapeutic Exercise: 8-22 mins                     [place] Pager [phone] Office [phone]    Brittannie Tawney 01/11/2020, 3:35 PM

## 2020-01-11 NOTE — Progress Notes (Signed)
actual start time of blood 1502

## 2020-01-12 LAB — BPAM RBC
Blood Product Expiration Date: 202104272359
Blood Product Expiration Date: 202104272359
ISSUE DATE / TIME: 202103251440
ISSUE DATE / TIME: 202103251818
Unit Type and Rh: 5100
Unit Type and Rh: 5100

## 2020-01-12 LAB — CBC
HCT: 31.6 % — ABNORMAL LOW (ref 39.0–52.0)
HCT: 34.9 % — ABNORMAL LOW (ref 39.0–52.0)
Hemoglobin: 10.7 g/dL — ABNORMAL LOW (ref 13.0–17.0)
Hemoglobin: 11.5 g/dL — ABNORMAL LOW (ref 13.0–17.0)
MCH: 33 pg (ref 26.0–34.0)
MCH: 32.2 pg (ref 26.0–34.0)
MCHC: 33.9 g/dL (ref 30.0–36.0)
MCHC: 33 g/dL (ref 30.0–36.0)
MCV: 97.5 fL (ref 80.0–100.0)
MCV: 97.8 fL (ref 80.0–100.0)
Platelets: 82 10*3/uL — ABNORMAL LOW (ref 150–400)
Platelets: 103 10*3/uL — ABNORMAL LOW (ref 150–400)
RBC: 3.24 MIL/uL — ABNORMAL LOW (ref 4.22–5.81)
RBC: 3.57 MIL/uL — ABNORMAL LOW (ref 4.22–5.81)
RDW: 15 % (ref 11.5–15.5)
RDW: 15.2 % (ref 11.5–15.5)
WBC: 8.9 10*3/uL (ref 4.0–10.5)
WBC: 9.6 10*3/uL (ref 4.0–10.5)
nRBC: 0 % (ref 0.0–0.2)
nRBC: 0 % (ref 0.0–0.2)

## 2020-01-12 LAB — BASIC METABOLIC PANEL
Anion gap: 5 (ref 5–15)
BUN: 18 mg/dL (ref 8–23)
CO2: 23 mmol/L (ref 22–32)
Calcium: 9.5 mg/dL (ref 8.9–10.3)
Chloride: 108 mmol/L (ref 98–111)
Creatinine, Ser: 0.91 mg/dL (ref 0.61–1.24)
GFR calc Af Amer: >60 mL/min (ref >60)
GFR calc non Af Amer: >60 mL/min (ref >60)
Glucose, Bld: 150 mg/dL — ABNORMAL HIGH (ref 70–99)
Potassium: 5.1 mmol/L (ref 3.5–5.1)
Sodium: 136 mmol/L (ref 135–145)

## 2020-01-12 LAB — TYPE AND SCREEN
ABO/RH(D): O POS
Antibody Screen: NEGATIVE
Unit division: 0
Unit division: 0

## 2020-01-12 MED ORDER — METHOCARBAMOL 500 MG PO TABS: 500.0000 mg | ORAL_TABLET | Freq: Four times a day (QID) | ORAL | 0 refills | Status: DC | PRN

## 2020-01-12 MED ORDER — TRAMADOL HCL 50 MG PO TABS: 50.0000 mg | ORAL_TABLET | Freq: Four times a day (QID) | ORAL | 0 refills | Status: DC | PRN

## 2020-01-12 MED ORDER — HYDROCODONE-ACETAMINOPHEN 5-325 MG PO TABS: 1.0000 | ORAL_TABLET | Freq: Four times a day (QID) | ORAL | 0 refills | Status: DC | PRN

## 2020-01-12 NOTE — Progress Notes (Signed)
Physical Therapy Treatment Patient Details Name: Parker Odonnell. MRN: [id number] DOB: Nov 02, 1931 Today's Date: 01/12/2020    History of Present Illness Patient is 86 y.o. male s/p Lt THA anterior approach on 01/10/20 with PMH significant for HTN, GERD, dyslipidemia, CHF, ASCVD, coronary atherosclerosis, OA.    PT Comments    Pt very cooperative but progressing slowly with mobility 2* pain/fatigue and poor ambulatory stability.     Follow Up Recommendations  Home health PT;Follow surgeon's recommendation for DC plan and follow-up therapies     Equipment Recommendations  None recommended by PT    Recommendations for Other Services       Precautions / Restrictions Precautions Precautions: Fall Restrictions Weight Bearing Restrictions: No    Mobility  Bed Mobility Overal bed mobility: Needs Assistance Bed Mobility: Supine to Sit     Supine to sit: Min assist     General bed mobility comments: Increased time with cues for sequence and use of R LE and UEs to self assist.    Transfers Overall transfer level: Needs assistance Equipment used: Rolling walker (2 wheeled) Transfers: Sit to/from Stand Sit to Stand: Min assist;Mod assist         General transfer comment: cues for hand placement/technique for power up. assist required to initiate and complete power up to rise.    Ambulation/Gait Ambulation/Gait assistance: Min assist Gait Distance (Feet): 38 Feet Assistive device: Rolling walker (2 wheeled) Gait Pattern/deviations: Step-to pattern;Decreased stride length;Decreased stance time - left;Decreased step length - left;Decreased weight shift to left;Trunk flexed;Antalgic Gait velocity: slow   General Gait Details: cues for sequence, posture and position from RW; No c/o dizziness   Stairs             Wheelchair Mobility    Modified Rankin (Stroke Patients Only)       Balance Overall balance assessment: Needs assistance Sitting-balance support:  Feet supported Sitting balance-Leahy Scale: Fair     Standing balance support: Bilateral upper extremity supported Standing balance-Leahy Scale: Poor                              Cognition Arousal/Alertness: Awake/alert Behavior During Therapy: WFL for tasks assessed/performed Overall Cognitive Status: Within Functional Limits for tasks assessed                                        Exercises Total Joint Exercises Ankle Circles/Pumps: AROM;Both;Seated;15 reps Quad Sets: AROM;Both;10 reps;Supine Heel Slides: AAROM;Left;Supine;20 reps Hip ABduction/ADduction: AAROM;Left;15 reps;Supine    General Comments        Pertinent Vitals/Pain Pain Assessment: 0-10 Pain Score: 5  Pain Location: Lt hip Pain Descriptors / Indicators: Aching;Guarding Pain Intervention(s): Limited activity within patient's tolerance;Monitored during session;Premedicated before session;Ice applied    Home Living                      Prior Function            PT Goals (current goals can now be found in the care plan section) Acute Rehab PT Goals Patient Stated Goal: to get back to ALF and work with therapy PT Goal Formulation: With patient Time For Goal Achievement: 01/17/20 Potential to Achieve Goals: Good Progress towards PT goals: Progressing toward goals    Frequency    7X/week      PT Plan Current  plan remains appropriate    Co-evaluation              AM-PAC PT "6 Clicks" Mobility   Outcome Measure  Help needed turning from your back to your side while in a flat bed without using bedrails?: A Little Help needed moving from lying on your back to sitting on the side of a flat bed without using bedrails?: A Little Help needed moving to and from a bed to a chair (including a wheelchair)?: A Lot Help needed standing up from a chair using your arms (e.g., wheelchair or bedside chair)?: A Lot Help needed to walk in hospital room?: A Lot Help  needed climbing 3-5 steps with a railing? : Total 6 Click Score: 13    End of Session Equipment Utilized During Treatment: Gait belt Activity Tolerance: Patient tolerated treatment well;Patient limited by fatigue Patient left: in chair;with call bell/phone within reach;with chair alarm set Nurse Communication: Mobility status PT Visit Diagnosis: Muscle weakness (generalized) (M62.81);Difficulty in walking, not elsewhere classified (R26.2)     Time: [id number] PT Time Calculation (min) (ACUTE ONLY): 25 min  Charges:  $Gait Training: 8-22 mins $Therapeutic Exercise: 8-22 mins                     Debe Coder PT Acute Rehabilitation Services Pager [phone] Office [phone]    Parker Odonnell 01/12/2020, 1:07 PM

## 2020-01-12 NOTE — Progress Notes (Signed)
Platelets improved at 103 and hemoglobin improved at 11.5. If does well with second session of physical therapy, he can discharge to home with HHPT. Otherwise will stay until tomorrow.   Theresa Duty, PA-C Orthopedic Surgery [facility] [place]

## 2020-01-12 NOTE — Progress Notes (Signed)
   Subjective: 2 Days Post-Op Procedure(s) (LRB): TOTAL HIP ARTHROPLASTY ANTERIOR APPROACH (Left) Patient reports pain as mild.   Plan is to go Home after hospital stay. Feels much better today with significant decrease in pain and feeling stronger overall post-transfusion  Objective: Vital signs in last 24 hours: Temp:  [97.9 F (36.6 C)-98.8 F (37.1 C)] 98.7 F (37.1 C) (03/26 0612) Pulse Rate:  [66-95] 91 (03/26 0612) Resp:  [16-18] 18 (03/26 0612) BP: (86-134)/(42-76) 126/70 (03/26 0612) SpO2:  [96 %-99 %] 97 % (03/26 0612)  Intake/Output from previous day:  Intake/Output Summary (Last 24 hours) at 01/12/2020 0702 Last data filed at 01/12/2020 0600 Gross per 24 hour  Intake 1852.46 ml  Output 500 ml  Net 1352.46 ml    Intake/Output this shift: No intake/output data recorded.  Labs: Recent Labs    01/11/20 0257 01/12/20 0117  HGB 8.3* 10.7*   Recent Labs    01/11/20 0257 01/12/20 0117  WBC 9.2 8.9  RBC 2.43* 3.24*  HCT 25.0* 31.6*  PLT 95* 82*   Recent Labs    01/11/20 0257 01/12/20 0117  NA 134* 136  K 4.0 5.1  CL 104 108  CO2 27 23  BUN 17 18  CREATININE 0.81 0.91  GLUCOSE 138* 150*  CALCIUM 8.9 9.5   No results for input(s): LABPT, INR in the last 72 hours.  EXAM General - Patient is Alert, Appropriate and Oriented Extremity - Neurologically intact Neurovascular intact No cellulitis present Compartment soft Dressing/Incision - clean, dry Motor Function - intact, moving foot and toes well on exam.   Past Medical History:  Diagnosis Date  . Arthritis   . ASCVD (arteriosclerotic cardiovascular disease)    single vessel  . CHF (congestive heart failure) ([place])   . Coronary atherosclerosis of native coronary artery   . Dyslipidemia   . ED (erectile dysfunction)   . GERD (gastroesophageal reflux disease)    hx of  . History of kidney stones   . Hypertension   . Inguinal hernia   . Left bundle branch block    chronic  . Postsurgical  percutaneous transluminal coronary angioplasty status     Assessment/Plan: 2 Days Post-Op Procedure(s) (LRB): TOTAL HIP ARTHROPLASTY ANTERIOR APPROACH (Left) Principal Problem:   OA (osteoarthritis) of hip Active Problems:   Primary osteoarthritis of left hip   Up with therapy D/C IV fluids  Should advance with therapy today as he looks and feels a lot better. Hemoglobin post-transfusion up to 10.7 and BP normalized Platelets are 82 down from 130 pre-op I want to repeat CBC this afternoon to see if hemoglobin and platelet count have stabilized If they have stabilized and if he is safe and has met PT goals then possible discharge this afternoon  DVT Prophylaxis - Eliquis Weight Bearing As Tolerated left Leg  Parker Odonnell 01/12/2020, 7:02 AM

## 2020-01-12 NOTE — Progress Notes (Signed)
Physical Therapy Treatment Patient Details Name: Parker Odonnell. MRN: [id number] DOB: 1931-12-28 Today's Date: 01/12/2020    History of Present Illness Patient is 86 y.o. male s/p Lt THA anterior approach on 01/10/20 with PMH significant for HTN, GERD, dyslipidemia, CHF, ASCVD, coronary atherosclerosis, OA.    PT Comments    Pt with marked improvement in activity tolerance this pm but continues to require significant assist to transition to standing and demonstrates mod instability with ambulation.   Follow Up Recommendations  Home health PT;Follow surgeon's recommendation for DC plan and follow-up therapies     Equipment Recommendations  None recommended by PT    Recommendations for Other Services       Precautions / Restrictions Precautions Precautions: Fall Restrictions Weight Bearing Restrictions: No    Mobility  Bed Mobility Overal bed mobility: Needs Assistance Bed Mobility: Sit to Supine       Sit to supine: Min assist;Mod assist   General bed mobility comments: cues for sequence with assist for bil LEs  Transfers Overall transfer level: Needs assistance Equipment used: Rolling walker (2 wheeled) Transfers: Sit to/from Stand Sit to Stand: Min assist;Mod assist         General transfer comment: cues for hand placement/technique for power up. assist required to initiate and complete power up to rise.    Ambulation/Gait Ambulation/Gait assistance: Min assist Gait Distance (Feet): 120 Feet Assistive device: Rolling walker (2 wheeled) Gait Pattern/deviations: Step-to pattern;Decreased stride length;Decreased stance time - left;Decreased step length - left;Decreased weight shift to left;Trunk flexed;Antalgic Gait velocity: decreased   General Gait Details: cues for sequence, posture, position from RW, safety awareness, and stride length; physical assist for balance, support and RW management   Stairs             Wheelchair Mobility    Modified  Rankin (Stroke Patients Only)       Balance Overall balance assessment: Needs assistance Sitting-balance support: Feet supported Sitting balance-Leahy Scale: Fair     Standing balance support: Bilateral upper extremity supported Standing balance-Leahy Scale: Poor                              Cognition Arousal/Alertness: Awake/alert Behavior During Therapy: WFL for tasks assessed/performed Overall Cognitive Status: Within Functional Limits for tasks assessed                                        Exercises      General Comments        Pertinent Vitals/Pain Pain Assessment: 0-10 Pain Score: 4  Pain Location: Lt hip Pain Descriptors / Indicators: Aching;Sore Pain Intervention(s): Limited activity within patient's tolerance;Monitored during session;Premedicated before session    Home Living                      Prior Function            PT Goals (current goals can now be found in the care plan section) Acute Rehab PT Goals Patient Stated Goal: to get back to ALF and work with therapy PT Goal Formulation: With patient Time For Goal Achievement: 01/17/20 Potential to Achieve Goals: Good Progress towards PT goals: Progressing toward goals    Frequency    7X/week      PT Plan Current plan remains appropriate    Co-evaluation  AM-PAC PT "6 Clicks" Mobility   Outcome Measure  Help needed turning from your back to your side while in a flat bed without using bedrails?: A Little Help needed moving from lying on your back to sitting on the side of a flat bed without using bedrails?: A Little Help needed moving to and from a bed to a chair (including a wheelchair)?: A Lot Help needed standing up from a chair using your arms (e.g., wheelchair or bedside chair)?: A Lot Help needed to walk in hospital room?: A Little Help needed climbing 3-5 steps with a railing? : A Lot 6 Click Score: 15    End of Session  Equipment Utilized During Treatment: Gait belt Activity Tolerance: Patient tolerated treatment well;Patient limited by fatigue Patient left: in bed;with call bell/phone within reach;with bed alarm set Nurse Communication: Mobility status PT Visit Diagnosis: Muscle weakness (generalized) (M62.81);Difficulty in walking, not elsewhere classified (R26.2)     Time: 1438-1500 PT Time Calculation (min) (ACUTE ONLY): 22 min  Charges:  $Gait Training: 8-22 mins                     [place] Pager [phone] Office [phone]    Raman Featherston 01/12/2020, 3:09 PM

## 2020-01-12 NOTE — Plan of Care (Signed)
  Problem: Education: Goal: Knowledge of General Education information will improve Description Including pain rating scale, medication(s)/side effects and non-pharmacologic comfort measures Outcome: Progressing   Problem: Health Behavior/Discharge Planning: Goal: Ability to manage health-related needs will improve Outcome: Progressing   Problem: Clinical Measurements: Goal: Ability to maintain clinical measurements within normal limits will improve Outcome: Progressing   Problem: Clinical Measurements: Goal: Will remain free from infection Outcome: Progressing   Problem: Clinical Measurements: Goal: Diagnostic test results will improve Outcome: Progressing   Problem: Activity: Goal: Risk for activity intolerance will decrease Outcome: Progressing   

## 2020-01-12 NOTE — TOC Initial Note (Signed)
Transition of Care (TOC) - Initial/Assessment Note    Patient Details  Name: Parker Odonnell. MRN: [id number] Date of Birth: 03/16/1932  Transition of Care [facility]) CM/SW Contact:    Leeroy Cha, RN Phone Number: 01/12/2020, 10:43 AM  Clinical Narrative:                 dcd to home with home health and dme  Expected Discharge Plan: [place] Barriers to Discharge: No Barriers Identified   Patient Goals and CMS Choice     Choice offered to / list presented to : Patient  Expected Discharge Plan and Services Expected Discharge Plan: [place]   Discharge Planning Services: CM Consult Post Acute Care Choice: Durable Medical Equipment, Home Health Living arrangements for the past 2 months: Single Family Home Expected Discharge Date: 01/12/20               DME Arranged: Gilford Rile rolling DME Agency: [employer] Date DME Agency Contacted: 01/12/20 Time DME Agency Contacted: 0900   HH Arranged: PT HH Agency: [facility] ([facility] [employer]) Date [place]: 01/12/20 Time [place]: 0900    Prior Living Arrangements/Services Living arrangements for the past 2 months: [place] with:: Spouse Patient language and need for interpreter reviewed:: No Do you feel safe going back to the place where you live?: Yes      Need for Family Participation in Patient Care: Yes (Comment) Care giver support system in place?: Yes (comment)   Criminal Activity/Legal Involvement Pertinent to Current Situation/Hospitalization: No - Comment as needed  Activities of Daily Living Home Assistive Devices/Equipment: Eyeglasses, Dentures (specify type), Cane (specify quad or straight) ADL Screening (condition at time of admission) Patient's cognitive ability adequate to safely complete daily activities?: Yes Is the patient deaf or have difficulty hearing?: No Does the patient have difficulty seeing, even when wearing  glasses/contacts?: No Does the patient have difficulty concentrating, remembering, or making decisions?: No Patient able to express need for assistance with ADLs?: Yes Does the patient have difficulty dressing or bathing?: No Independently performs ADLs?: Yes (appropriate for developmental age) Does the patient have difficulty walking or climbing stairs?: Yes Weakness of Legs: Both Weakness of Arms/Hands: None  Permission Sought/Granted   Permission granted to share information with : Yes, Verbal Permission Granted              Emotional Assessment Appearance:: Appears stated age     Orientation: : Oriented to Self, Oriented to Place, Oriented to  Time, Oriented to Situation Alcohol / Substance Use: Not Applicable Psych Involvement: No (comment)  Admission diagnosis:  Primary osteoarthritis of left hip [M16.12] Patient Active Problem List   Diagnosis Date Noted  . OA (osteoarthritis) of hip 01/10/2020  . Primary osteoarthritis of left hip 01/10/2020  . Preoperative cardiovascular examination   . Shortness of breath   . Bilateral pleural effusion   . Thrombocytopenia ([place]) 06/03/2018  . Atrial flutter ([place]) 06/03/2018  . Acute on chronic systolic CHF (congestive heart failure) ([place]) 06/03/2018  . Bilateral edema of lower extremity   . Essential hypertension 09/19/2015  . LBBB (left bundle branch block) 09/19/2015  . Hypokalemia 08/27/2014  . Hypertension   . ASCVD (arteriosclerotic cardiovascular disease)   . Dyslipidemia   . ED (erectile dysfunction)   . Left bundle branch block   . Postsurgical percutaneous transluminal coronary angioplasty status   . Coronary atherosclerosis of native coronary artery   .  Bilateral inguinal hernia 06/15/2012   PCP:  Lawerance Cruel, MD Pharmacy:   [id number] - [place], [place] [address] Phone: [phone] Fax: [phone]  [facility] - [place], [place] -  [address] [place] [address] Phone: [phone] Fax: [phone]     Social Determinants of Health (SDOH) Interventions    Readmission Risk Interventions No flowsheet data found.

## 2020-01-13 LAB — CBC
HCT: 32.8 % — ABNORMAL LOW (ref 39.0–52.0)
Hemoglobin: 11 g/dL — ABNORMAL LOW (ref 13.0–17.0)
MCH: 33.2 pg (ref 26.0–34.0)
MCHC: 33.5 g/dL (ref 30.0–36.0)
MCV: 99.1 fL (ref 80.0–100.0)
Platelets: 88 10*3/uL — ABNORMAL LOW (ref 150–400)
RBC: 3.31 MIL/uL — ABNORMAL LOW (ref 4.22–5.81)
RDW: 14.8 % (ref 11.5–15.5)
WBC: 6.3 10*3/uL (ref 4.0–10.5)
nRBC: 0 % (ref 0.0–0.2)

## 2020-01-13 NOTE — Plan of Care (Signed)
All discharge teaching is done. Questions were answered.

## 2020-01-13 NOTE — Plan of Care (Signed)
  Problem: Education: Goal: Knowledge of General Education information will improve Description: Including pain rating scale, medication(s)/side effects and non-pharmacologic comfort measures Outcome: Progressing   Problem: Health Behavior/Discharge Planning: Goal: Ability to manage health-related needs will improve Outcome: Progressing   Problem: Clinical Measurements: Goal: Ability to maintain clinical measurements within normal limits will improve Outcome: Progressing   Problem: Clinical Measurements: Goal: Will remain free from infection Outcome: Progressing   Problem: Clinical Measurements: Goal: Diagnostic test results will improve Outcome: Progressing   Problem: Clinical Measurements: Goal: Cardiovascular complication will be avoided Outcome: Progressing   

## 2020-01-13 NOTE — Progress Notes (Signed)
Physical Therapy Treatment Patient Details Name: Parker Odonnell. MRN: [id number] DOB: 1931-11-30 Today's Date: 01/13/2020    History of Present Illness Patient is 86 y.o. male s/p Lt THA anterior approach on 01/10/20 with PMH significant for HTN, GERD, dyslipidemia, CHF, ASCVD, coronary atherosclerosis, OA.    PT Comments    Pt continues to require increased time and cueing but is progressing steadily with mobility.  Pt's daughter present and assisting during session.  Car transfers reviewed with pt and dtr.   Follow Up Recommendations  Home health PT;Follow surgeon's recommendation for DC plan and follow-up therapies     Equipment Recommendations  None recommended by PT    Recommendations for Other Services       Precautions / Restrictions Precautions Precautions: Fall Restrictions Weight Bearing Restrictions: No    Mobility  Bed Mobility Overal bed mobility: Needs Assistance Bed Mobility: Supine to Sit     Supine to sit: Min assist     General bed mobility comments: cues for sequence with assist for L LE  Transfers Overall transfer level: Needs assistance Equipment used: Rolling walker (2 wheeled) Transfers: Sit to/from Stand Sit to Stand: Min assist         General transfer comment: cues for hand placement/technique for power up. assist required to initiate and complete power up to rise.    Ambulation/Gait Ambulation/Gait assistance: Min assist;Min guard Gait Distance (Feet): 140 Feet Assistive device: Rolling walker (2 wheeled) Gait Pattern/deviations: Decreased stride length;Decreased step length - left;Decreased weight shift to left;Trunk flexed;Antalgic;Step-to pattern;Step-through pattern;Decreased step length - right;Decreased stance time - left Gait velocity: decreased   General Gait Details: cues for sequence, posture, position from RW, safety awareness, and stride length   Stairs             Wheelchair Mobility    Modified Rankin  (Stroke Patients Only)       Balance Overall balance assessment: Needs assistance Sitting-balance support: Feet supported Sitting balance-Leahy Scale: Good     Standing balance support: Bilateral upper extremity supported Standing balance-Leahy Scale: Poor                              Cognition Arousal/Alertness: Awake/alert Behavior During Therapy: WFL for tasks assessed/performed Overall Cognitive Status: Within Functional Limits for tasks assessed                                        Exercises Total Joint Exercises Ankle Circles/Pumps: AROM;Both;Seated;15 reps Quad Sets: AROM;Both;10 reps;Supine Heel Slides: AAROM;Left;Supine;20 reps Hip ABduction/ADduction: AAROM;Left;15 reps;Supine    General Comments        Pertinent Vitals/Pain Pain Assessment: Faces Faces Pain Scale: Hurts little more Pain Location: Lt hip Pain Descriptors / Indicators: Aching;Sore Pain Intervention(s): Limited activity within patient's tolerance;Monitored during session;Premedicated before session;Ice applied    Home Living                      Prior Function            PT Goals (current goals can now be found in the care plan section) Acute Rehab PT Goals Patient Stated Goal: to get back to ALF and work with therapy PT Goal Formulation: With patient Time For Goal Achievement: 01/17/20 Potential to Achieve Goals: Good Progress towards PT goals: Progressing toward goals    Frequency  7X/week      PT Plan Current plan remains appropriate    Co-evaluation              AM-PAC PT "6 Clicks" Mobility   Outcome Measure  Help needed turning from your back to your side while in a flat bed without using bedrails?: A Little Help needed moving from lying on your back to sitting on the side of a flat bed without using bedrails?: A Little Help needed moving to and from a bed to a chair (including a wheelchair)?: A Lot Help needed standing  up from a chair using your arms (e.g., wheelchair or bedside chair)?: A Little Help needed to walk in hospital room?: A Little Help needed climbing 3-5 steps with a railing? : A Lot 6 Click Score: 16    End of Session Equipment Utilized During Treatment: Gait belt Activity Tolerance: Patient tolerated treatment well Patient left: in chair;with call bell/phone within reach;with chair alarm set;with family/visitor present Nurse Communication: Mobility status PT Visit Diagnosis: Muscle weakness (generalized) (M62.81);Difficulty in walking, not elsewhere classified (R26.2)     Time: [id number] PT Time Calculation (min) (ACUTE ONLY): 42 min  Charges:  $Gait Training: 8-22 mins $Therapeutic Exercise: 8-22 mins $Therapeutic Activity: 8-22 mins                     Debe Coder PT Acute Rehabilitation Services Pager [phone] Office [phone]    Archer Moist 01/13/2020, 12:54 PM

## 2020-01-13 NOTE — Progress Notes (Signed)
   Subjective: 3 Days Post-Op Procedure(s) (LRB): TOTAL HIP ARTHROPLASTY ANTERIOR APPROACH (Left) Patient reports pain as mild.   Plan is to go Home after hospital stay. No specific complaints this morning.  However, he does not feel appropriate to discharge home.  He has some concerns about mobilization.  Objective: Vital signs in last 24 hours: Temp:  [97.6 F (36.4 C)-98.9 F (37.2 C)] 97.7 F (36.5 C) (03/27 0751) Pulse Rate:  [66-87] 84 (03/27 0751) Resp:  [14-18] 18 (03/27 0751) BP: (114-142)/(55-87) 142/68 (03/27 0751) SpO2:  [95 %-100 %] 100 % (03/27 0751) FiO2 (%):  [16 %] 16 % (03/26 1811)  Intake/Output from previous day:  Intake/Output Summary (Last 24 hours) at 01/13/2020 0848 Last data filed at 01/13/2020 0559 Gross per 24 hour  Intake 840 ml  Output 480 ml  Net 360 ml    Intake/Output this shift: No intake/output data recorded.  Labs: Recent Labs    01/11/20 0257 01/12/20 0117 01/12/20 1325 01/13/20 0330  HGB 8.3* 10.7* 11.5* 11.0*   Recent Labs    01/12/20 1325 01/13/20 0330  WBC 9.6 6.3  RBC 3.57* 3.31*  HCT 34.9* 32.8*  PLT 103* 88*   Recent Labs    01/11/20 0257 01/12/20 0117  NA 134* 136  K 4.0 5.1  CL 104 108  CO2 27 23  BUN 17 18  CREATININE 0.81 0.91  GLUCOSE 138* 150*  CALCIUM 8.9 9.5   No results for input(s): LABPT, INR in the last 72 hours.  EXAM General - Patient is Alert, Appropriate and Oriented Extremity - Neurologically intact Neurovascular intact No cellulitis present Compartment soft Dressing/Incision - clean, dry Motor Function - intact, moving foot and toes well on exam.   Past Medical History:  Diagnosis Date  . Arthritis   . ASCVD (arteriosclerotic cardiovascular disease)    single vessel  . CHF (congestive heart failure) ([place])   . Coronary atherosclerosis of native coronary artery   . Dyslipidemia   . ED (erectile dysfunction)   . GERD (gastroesophageal reflux disease)    hx of  . History of  kidney stones   . Hypertension   . Inguinal hernia   . Left bundle branch block    chronic  . Postsurgical percutaneous transluminal coronary angioplasty status     Assessment/Plan: 3 Days Post-Op Procedure(s) (LRB): TOTAL HIP ARTHROPLASTY ANTERIOR APPROACH (Left) Principal Problem:   OA (osteoarthritis) of hip Active Problems:   Primary osteoarthritis of left hip   Up with therapy D/C IV fluids  Repeat CBC this morning is appropriate with hemoglobin of 11, and platelets at 88,000.  He continues to have some concerns about discharge home today.  He feels as though another day with our therapist would assuage that concern.  We will plan for discharge home tomorrow or this evening. DVT Prophylaxis - Eliquis Weight Bearing As Tolerated left Leg  Nicholes Stairs 01/13/2020, 8:48 AM

## 2020-01-30 NOTE — Discharge Summary (Signed)
Physician Discharge Summary   Patient ID: McCulloch Sink. MRN: [id number] DOB/AGE: [birth year] 86 y.o.  Admit date: 01/10/2020 Discharge date: 01/13/2020  Primary Diagnosis: Osteoarthritis, left hip   Admission Diagnoses:  Past Medical History:  Diagnosis Date  . Arthritis   . ASCVD (arteriosclerotic cardiovascular disease)    single vessel  . CHF (congestive heart failure) ([place])   . Coronary atherosclerosis of native coronary artery   . Dyslipidemia   . ED (erectile dysfunction)   . GERD (gastroesophageal reflux disease)    hx of  . History of kidney stones   . Hypertension   . Inguinal hernia   . Left bundle branch block    chronic  . Postsurgical percutaneous transluminal coronary angioplasty status    Discharge Diagnoses:   Principal Problem:   OA (osteoarthritis) of hip Active Problems:   Primary osteoarthritis of left hip  Estimated body mass index is 20.75 kg/m as calculated from the following:   Height as of this encounter: 5\' 8"  (1.727 m).   Weight as of this encounter: 61.9 kg.  Procedure:  Procedure(s) (LRB): TOTAL HIP ARTHROPLASTY ANTERIOR APPROACH (Left)   Consults: None  HPI: Parker Odonnell. is a 86 y.o. male who has advanced end-  stage arthritis of their Left  hip with progressively worsening pain and  dysfunction.The patient has failed nonoperative management and presents today for total hip arthroplasty.  Laboratory Data: Admission on 01/10/2020, Discharged on 01/13/2020  Component Date Value Ref Range Status  . WBC 01/11/2020 9.2  4.0 - 10.5 K/uL Final  . RBC 01/11/2020 2.43* 4.22 - 5.81 MIL/uL Final  . Hemoglobin 01/11/2020 8.3* 13.0 - 17.0 g/dL Final  . HCT 01/11/2020 25.0* 39.0 - 52.0 % Final  . MCV 01/11/2020 102.9* 80.0 - 100.0 fL Final  . MCH 01/11/2020 34.2* 26.0 - 34.0 pg Final  . MCHC 01/11/2020 33.2  30.0 - 36.0 g/dL Final  . RDW 01/11/2020 13.0  11.5 - 15.5 % Final  . Platelets 01/11/2020 95* 150 - 400 K/uL Final   Comment:  REPEATED TO VERIFY PLATELET COUNT CONFIRMED BY SMEAR Immature Platelet Fraction may be clinically indicated, consider ordering this additional test [id number]   . nRBC 01/11/2020 0.0  0.0 - 0.2 % Final   Performed at [facility] [address] [place], [place] [address]  . Sodium 01/11/2020 134* 135 - 145 mmol/L Final  . Potassium 01/11/2020 4.0  3.5 - 5.1 mmol/L Final  . Chloride 01/11/2020 104  98 - 111 mmol/L Final  . CO2 01/11/2020 27  22 - 32 mmol/L Final  . Glucose, Bld 01/11/2020 138* 70 - 99 mg/dL Final   Glucose reference range applies only to samples taken after fasting for at least 8 hours.  . BUN 01/11/2020 17  8 - 23 mg/dL Final  . Creatinine, Ser 01/11/2020 0.81  0.61 - 1.24 mg/dL Final  . Calcium 01/11/2020 8.9  8.9 - 10.3 mg/dL Final  . GFR calc non Af Amer 01/11/2020 >60  >60 mL/min Final  . GFR calc Af Amer 01/11/2020 >60  >60 mL/min Final  . Anion gap 01/11/2020 3* 5 - 15 Final   Performed at [facility], [place] [address] [place],  [address]  . Order Confirmation 01/11/2020    Final                   Value:ORDER PROCESSED BY BLOOD BANK Performed at [facility], [place]  [place], [place] [address]   . WBC 01/12/2020 8.9  4.0 - 10.5 K/uL Final  . RBC 01/12/2020 3.24* 4.22 - 5.81 MIL/uL Final  . Hemoglobin 01/12/2020 10.7* 13.0 - 17.0 g/dL Final   Comment: REPEATED TO VERIFY POST TRANSFUSION SPECIMEN DELTA CHECK NOTED   . HCT 01/12/2020 31.6* 39.0 - 52.0 % Final  . MCV 01/12/2020 97.5  80.0 - 100.0 fL Final  . MCH 01/12/2020 33.0  26.0 - 34.0 pg Final  . MCHC 01/12/2020 33.9  30.0 - 36.0 g/dL Final  . RDW 01/12/2020 15.0  11.5 - 15.5 % Final  . Platelets 01/12/2020 82* 150 - 400 K/uL Final   Comment: REPEATED TO VERIFY Immature Platelet Fraction may be clinically indicated, consider ordering this additional test [id number] CONSISTENT WITH PREVIOUS RESULT   . nRBC 01/12/2020 0.0  0.0 -  0.2 % Final   Performed at [facility] [address] [place], [place] [address]  . Sodium 01/12/2020 136  135 - 145 mmol/L Final  . Potassium 01/12/2020 5.1  3.5 - 5.1 mmol/L Final   DELTA CHECK NOTED  . Chloride 01/12/2020 108  98 - 111 mmol/L Final  . CO2 01/12/2020 23  22 - 32 mmol/L Final  . Glucose, Bld 01/12/2020 150* 70 - 99 mg/dL Final   Glucose reference range applies only to samples taken after fasting for at least 8 hours.  . BUN 01/12/2020 18  8 - 23 mg/dL Final  . Creatinine, Ser 01/12/2020 0.91  0.61 - 1.24 mg/dL Final  . Calcium 01/12/2020 9.5  8.9 - 10.3 mg/dL Final  . GFR calc non Af Amer 01/12/2020 >60  >60 mL/min Final  . GFR calc Af Amer 01/12/2020 >60  >60 mL/min Final  . Anion gap 01/12/2020 5  5 - 15 Final   Performed at [facility], [place] [address] [place], [place] [address]  . WBC 01/12/2020 9.6  4.0 - 10.5 K/uL Final  . RBC 01/12/2020 3.57* 4.22 - 5.81 MIL/uL Final  . Hemoglobin 01/12/2020 11.5* 13.0 - 17.0 g/dL Final  . HCT 01/12/2020 34.9* 39.0 - 52.0 % Final  . MCV 01/12/2020 97.8  80.0 - 100.0 fL Final  . MCH 01/12/2020 32.2  26.0 - 34.0 pg Final  . MCHC 01/12/2020 33.0  30.0 - 36.0 g/dL Final  . RDW 01/12/2020 15.2  11.5 - 15.5 % Final  . Platelets 01/12/2020 103* 150 - 400 K/uL Final   Comment: REPEATED TO VERIFY PLATELET COUNT CONFIRMED BY SMEAR SPECIMEN CHECKED FOR CLOTS Immature Platelet Fraction may be clinically indicated, consider ordering this additional test [id number]   . nRBC 01/12/2020 0.0  0.0 - 0.2 % Final   Performed at [facility] [address], [place], [place] [address]  . WBC 01/13/2020 6.3  4.0 - 10.5 K/uL Final  . RBC 01/13/2020 3.31* 4.22 - 5.81 MIL/uL Final  . Hemoglobin 01/13/2020 11.0* 13.0 - 17.0 g/dL Final  . HCT 01/13/2020 32.8* 39.0 - 52.0 % Final  . MCV 01/13/2020 99.1  80.0 - 100.0 fL Final  . MCH 01/13/2020 33.2  26.0 - 34.0 pg Final  . MCHC 01/13/2020  33.5  30.0 - 36.0 g/dL Final  . RDW 01/13/2020 14.8  11.5 - 15.5 % Final  . Platelets 01/13/2020 88* 150 - 400 K/uL Final   Comment: Immature Platelet Fraction may be clinically indicated, consider ordering this additional test [id number]   . nRBC 01/13/2020 0.0  0.0 - 0.2 % Final  Performed at [facility], [place] [address] [place], [place] [address]  Hospital Outpatient Visit on 01/03/2020  Component Date Value Ref Range Status  . aPTT 01/03/2020 29  24 - 36 seconds Final   Performed at [facility], [place] [address], [place], [place] [address]  . WBC 01/03/2020 5.1  4.0 - 10.5 K/uL Final  . RBC 01/03/2020 3.67* 4.22 - 5.81 MIL/uL Final  . Hemoglobin 01/03/2020 12.5* 13.0 - 17.0 g/dL Final  . HCT 01/03/2020 38.4* 39.0 - 52.0 % Final  . MCV 01/03/2020 104.6* 80.0 - 100.0 fL Final  . MCH 01/03/2020 34.1* 26.0 - 34.0 pg Final  . MCHC 01/03/2020 32.6  30.0 - 36.0 g/dL Final  . RDW 01/03/2020 13.0  11.5 - 15.5 % Final  . Platelets 01/03/2020 131* 150 - 400 K/uL Final   Comment: Immature Platelet Fraction may be clinically indicated, consider ordering this additional test [id number]   . nRBC 01/03/2020 0.0  0.0 - 0.2 % Final   Performed at [facility] [address] [place], [place] [address]  . Sodium 01/03/2020 136  135 - 145 mmol/L Final  . Potassium 01/03/2020 4.4  3.5 - 5.1 mmol/L Final  . Chloride 01/03/2020 103  98 - 111 mmol/L Final  . CO2 01/03/2020 27  22 - 32 mmol/L Final  . Glucose, Bld 01/03/2020 96  70 - 99 mg/dL Final   Glucose reference range applies only to samples taken after fasting for at least 8 hours.  . BUN 01/03/2020 15  8 - 23 mg/dL Final  . Creatinine, Ser 01/03/2020 0.95  0.61 - 1.24 mg/dL Final  . Calcium 01/03/2020 9.7  8.9 - 10.3 mg/dL Final  . Total Protein 01/03/2020 6.8  6.5 - 8.1 g/dL Final  . Albumin 01/03/2020 3.9  3.5 - 5.0 g/dL Final  . AST 01/03/2020 24  15 - 41 U/L Final  . ALT  01/03/2020 28  0 - 44 U/L Final  . Alkaline Phosphatase 01/03/2020 90  38 - 126 U/L Final  . Total Bilirubin 01/03/2020 1.5* 0.3 - 1.2 mg/dL Final  . GFR calc non Af Amer 01/03/2020 >60  >60 mL/min Final  . GFR calc Af Amer 01/03/2020 >60  >60 mL/min Final  . Anion gap 01/03/2020 6  5 - 15 Final   Performed at [facility], [place] [address], [place],  [address]  . Prothrombin Time 01/03/2020 14.6  11.4 - 15.2 seconds Final  . INR 01/03/2020 1.2  0.8 - 1.2 Final   Comment: (NOTE) INR goal varies based on device and disease states. Performed at [facility], [place] [address] [place],  [address]   . ABO/RH(D) 01/03/2020 O POS   Final  . Antibody Screen 01/03/2020 NEG   Final  . Sample Expiration 01/03/2020 01/13/2020,2359   Final  . Extend sample reason 01/03/2020 NO TRANSFUSIONS OR PREGNANCY IN THE PAST 3 MONTHS   Final  . Unit Number 01/03/2020 [id number]   Final  . Blood Component Type 01/03/2020 RBC LR PHER1   Final  . Unit division 01/03/2020 00   Final  . Status of Unit 01/03/2020 ISSUED,FINAL   Final  . Transfusion Status 01/03/2020 OK TO TRANSFUSE   Final  . Crossmatch Result 01/03/2020    Final                   Value:Compatible Performed at [facility], [address] [place], [place]  [id number]   . Unit Number 01/03/2020 [id number]   Final  . Blood Component Type 01/03/2020 RED CELLS,LR   Final  . Unit division 01/03/2020 00   Final  . Status of Unit 01/03/2020 ISSUED,FINAL   Final  . Transfusion Status 01/03/2020 OK TO TRANSFUSE   Final  . Crossmatch Result 01/03/2020 Compatible   Final  . MRSA, PCR 01/03/2020 NEGATIVE  NEGATIVE Final  . Staphylococcus aureus 01/03/2020 POSITIVE* NEGATIVE Final   Comment: (NOTE) The Xpert SA Assay (FDA approved for NASAL specimens in patients 47 years of age and older), is one component of a comprehensive surveillance program. It is not intended to diagnose  infection nor to guide or monitor treatment. Performed at [facility], [place] [address] [place], [place] [address]   . ABO/RH(D) 01/03/2020    Final                   Value:O POS Performed at [facility], [place] [address], [place], [place] [address]   . ISSUE DATE / TIME 01/03/2020 [id number]   Final  . Blood Product Unit Number 01/03/2020 [id number]   Final  . PRODUCT CODE 01/03/2020 [id number]   Final  . Unit Type and Rh 01/03/2020 5100   Final  . Blood Product Expiration Date 01/03/2020 [id number]   Final  . ISSUE DATE / TIME 01/03/2020 [id number]   Final  . Blood Product Unit Number 01/03/2020 [id number]   Final  . PRODUCT CODE 01/03/2020 [id number]   Final  . Unit Type and Rh 01/03/2020 5100   Final  . Blood Product Expiration Date 01/03/2020 [id number]   Final     X-Rays:DG Pelvis Portable  Result Date: 01/10/2020 CLINICAL DATA:  Postop left hip arthroplasty. EXAM: PORTABLE PELVIS 1-2 VIEWS COMPARISON:  None. FINDINGS: Left hip arthroplasty in expected alignment. Femoral stem is midline. No periprosthetic lucency or fracture. Recent postsurgical change includes air and edema in the soft tissues and drain in place. Advanced right hip osteoarthritis. Bones are diffusely under mineralized. IMPRESSION: Left hip arthroplasty in expected alignment. No immediate postoperative complication. Electronically Signed   By: Keith Rake M.D.   On: 01/10/2020 10:32   DG C-Arm 1-60 Min-No Report  Result Date: 01/10/2020 Fluoroscopy was utilized by the requesting physician.  No radiographic interpretation.   DG HIP OPERATIVE UNILAT W OR W/O PELVIS LEFT  Result Date: 01/10/2020 CLINICAL DATA:  Left hip replacement. EXAM: OPERATIVE LEFT HIP (WITH PELVIS IF PERFORMED) TECHNIQUE: Fluoroscopic spot image(s) were submitted for interpretation post-operatively. COMPARISON:  None. FINDINGS: Two fluoroscopic spot views of the pelvis and left hip performed  in the operating room. Left hip arthroplasty in place in expected alignment. Total fluoroscopy time 8 seconds. IMPRESSION: Intraoperative fluoroscopic spot views after left hip arthroplasty. Electronically Signed   By: Keith Rake M.D.   On: 01/10/2020 10:31    EKG: Orders placed or performed in visit on 11/10/19  . EKG 12-Lead     Hospital Course: Caesar Monge. is a 86 y.o. who was admitted to [facility]. They were brought to the operating room on 01/10/2020 and underwent Procedure(s): [place].  Patient tolerated the procedure well and was later transferred to the recovery room and then to the orthopaedic floor for postoperative care. They were given PO and IV analgesics for pain control following their surgery. They were given 24 hours of postoperative antibiotics of  Anti-infectives (From admission, onward)   Start  Dose/Rate Route Frequency Ordered Stop   01/10/20 1400  ceFAZolin (ANCEF) IVPB 2g/100 mL premix     2 g 200 mL/hr over 30 Minutes Intravenous Every 6 hours 01/10/20 1052 01/10/20 2107   01/10/20 0600  ceFAZolin (ANCEF) IVPB 2g/100 mL premix     2 g 200 mL/hr over 30 Minutes Intravenous On call to O.R. 01/10/20 [id number] 01/10/20 0732     and started on DVT prophylaxis in the form of Eliquis.   PT and OT were ordered for total joint protocol. Discharge planning consulted to help with postop disposition and equipment needs. Patient had a decent night on the evening of surgery. They started to get up OOB with therapy on POD #1. Hemovac drain was pulled without difficulty on day one. Hemoglobin was noted to be 8.3 on day one from 12.5 preoperatively. 2 units of PRBCs were ordered. Continued to work with therapy into POD #2. Pt was seen during rounds on day two and was ready to go home pending progress with therapy. Dressing was changed and the incision was clean, dry and intact with no drainage. Hemoglobin was improved at 11.5. He was  discharged to home later that day in stable condition.  Diet: Cardiac diet Activity: WBAT Follow-up: in 2 weeks Disposition: Home with HHPT Discharged Condition: stable   Discharge Instructions    Call MD / Call 911   Complete by: As directed    If you experience chest pain or shortness of breath, CALL 911 and be transported to the hospital emergency room.  If you develope a fever above 101 F, pus (white drainage) or increased drainage or redness at the wound, or calf pain, call your surgeon's office.   Call MD / Call 911   Complete by: As directed    If you experience chest pain or shortness of breath, CALL 911 and be transported to the hospital emergency room.  If you develope a fever above 101 F, pus (white drainage) or increased drainage or redness at the wound, or calf pain, call your surgeon's office.   Change dressing   Complete by: As directed    You have an adhesive waterproof bandage over the incision. Leave this in place until your first follow-up appointment. Once you remove this you will not need to place another bandage.   Constipation Prevention   Complete by: As directed    Drink plenty of fluids.  Prune juice may be helpful.  You may use a stool softener, such as Colace (over the counter) 100 mg twice a day.  Use MiraLax (over the counter) for constipation as needed.   Constipation Prevention   Complete by: As directed    Drink plenty of fluids.  Prune juice may be helpful.  You may use a stool softener, such as Colace (over the counter) 100 mg twice a day.  Use MiraLax (over the counter) for constipation as needed.   Diet - low sodium heart healthy   Complete by: As directed    Diet - low sodium heart healthy   Complete by: As directed    Do not sit on low chairs, stoools or toilet seats, as it may be difficult to get up from low surfaces   Complete by: As directed    Driving restrictions   Complete by: As directed    No driving for two weeks   Increase activity  slowly as tolerated   Complete by: As directed    TED hose   Complete by: As  directed    Use stockings (TED hose) for three weeks on both leg(s).  You may remove them at night for sleeping.   Weight bearing as tolerated   Complete by: As directed      Allergies as of 01/13/2020   No Known Allergies     Medication List    TAKE these medications   apixaban 2.5 MG Tabs tablet Commonly known as: ELIQUIS Take 1 tablet (2.5 mg total) by mouth 2 (two) times daily.   atorvastatin 40 MG tablet Commonly known as: LIPITOR Take 1 tablet (40 mg total) by mouth daily at 6 PM.   b complex vitamins tablet Take 1 tablet by mouth daily.   carvedilol 3.125 MG tablet Commonly known as: COREG TAKE 1 TABLET (3.125 MG TOTAL) BY MOUTH 2 (TWO) TIMES DAILY WITH A MEAL. What changed:   how much to take  how to take this  when to take this  additional instructions   furosemide 20 MG tablet Commonly known as: LASIX Take 1 tablet (20 mg total) by mouth daily.   HYDROcodone-acetaminophen 5-325 MG tablet Commonly known as: NORCO/VICODIN Take 1-2 tablets by mouth every 6 (six) hours as needed for severe pain.   lisinopril 20 MG tablet Commonly known as: ZESTRIL Take 1 tablet (20 mg total) by mouth daily.   methocarbamol 500 MG tablet Commonly known as: ROBAXIN Take 1 tablet (500 mg total) by mouth every 6 (six) hours as needed for muscle spasms.   multivitamin with minerals Tabs tablet Take 1 tablet by mouth every other day. [facility]  Spectravite Multivitamin   OCUVITE-LUTEIN PO Take 1 capsule by mouth daily.   spironolactone 25 MG tablet Commonly known as: ALDACTONE Take 0.5 tablets (12.5 mg total) by mouth daily.   Systane 0.4-0.3 % Soln Generic drug: Polyethyl Glycol-Propyl Glycol Apply 1 drop to eye 2 (two) times daily as needed (dry eyes).   traMADol 50 MG tablet Commonly known as: ULTRAM Take 1-2 tablets (50-100 mg total) by mouth every 6 (six) hours as needed for moderate  pain. What changed:   how much to take  when to take this  reasons to take this            Discharge Care Instructions  (From admission, onward)         Start     Ordered   01/12/20 0000  Weight bearing as tolerated     01/12/20 0735   01/12/20 0000  Change dressing    Comments: You have an adhesive waterproof bandage over the incision. Leave this in place until your first follow-up appointment. Once you remove this you will not need to place another bandage.   01/12/20 0735         Follow-up Information    Gaynelle Arabian, MD. Schedule an appointment as soon as possible for a visit on 01/25/2020.   Specialty: Orthopedic Surgery Contact information: [address] [place]  [id number] [phone]        [facility] Follow up.   Specialty: [place] Why: they will call you Contact information: [address] [place] [address] [phone]           Signed: Theresa Duty, PA-C Orthopedic Surgery 01/30/2020, 10:28 AM

## 2020-08-10 ENCOUNTER — Emergency Department (HOSPITAL_BASED_OUTPATIENT_CLINIC_OR_DEPARTMENT_OTHER): Payer: Medicare Other

## 2020-08-10 ENCOUNTER — Encounter (HOSPITAL_BASED_OUTPATIENT_CLINIC_OR_DEPARTMENT_OTHER): Payer: Self-pay | Admitting: *Deleted

## 2020-08-10 ENCOUNTER — Emergency Department (HOSPITAL_BASED_OUTPATIENT_CLINIC_OR_DEPARTMENT_OTHER)
Admission: EM | Admit: 2020-08-10 | Discharge: 2020-08-11 | Disposition: A | Discharge status: Home / Self Care | Payer: Medicare Other | Attending: Emergency Medicine | Admitting: Emergency Medicine

## 2020-08-10 ENCOUNTER — Other Ambulatory Visit: Payer: Self-pay

## 2020-08-10 DIAGNOSIS — I2510 Atherosclerotic heart disease of native coronary artery without angina pectoris: Secondary | ICD-10-CM | POA: Insufficient documentation

## 2020-08-10 DIAGNOSIS — Z7901 Long term (current) use of anticoagulants: Secondary | ICD-10-CM | POA: Diagnosis not present

## 2020-08-10 DIAGNOSIS — Z96642 Presence of left artificial hip joint: Secondary | ICD-10-CM | POA: Diagnosis not present

## 2020-08-10 DIAGNOSIS — Z79899 Other long term (current) drug therapy: Secondary | ICD-10-CM | POA: Diagnosis not present

## 2020-08-10 DIAGNOSIS — S299XXA Unspecified injury of thorax, initial encounter: Secondary | ICD-10-CM | POA: Insufficient documentation

## 2020-08-10 DIAGNOSIS — X58XXXA Exposure to other specified factors, initial encounter: Secondary | ICD-10-CM | POA: Insufficient documentation

## 2020-08-10 DIAGNOSIS — Y92530 Ambulatory surgery center as the place of occurrence of the external cause: Secondary | ICD-10-CM | POA: Diagnosis not present

## 2020-08-10 DIAGNOSIS — Z955 Presence of coronary angioplasty implant and graft: Secondary | ICD-10-CM | POA: Insufficient documentation

## 2020-08-10 DIAGNOSIS — Y9389 Activity, other specified: Secondary | ICD-10-CM | POA: Diagnosis not present

## 2020-08-10 DIAGNOSIS — R053 Chronic cough: Secondary | ICD-10-CM | POA: Insufficient documentation

## 2020-08-10 DIAGNOSIS — I509 Heart failure, unspecified: Secondary | ICD-10-CM | POA: Insufficient documentation

## 2020-08-10 DIAGNOSIS — Z5189 Encounter for other specified aftercare: Secondary | ICD-10-CM | POA: Diagnosis not present

## 2020-08-10 DIAGNOSIS — I110 Hypertensive heart disease with heart failure: Secondary | ICD-10-CM | POA: Diagnosis not present

## 2020-08-10 HISTORY — DX: Malignant (primary) neoplasm, unspecified: C80.1

## 2020-08-10 MED ORDER — CEPHALEXIN 250 MG PO CAPS
1000.0000 mg | ORAL_CAPSULE | Freq: Once | ORAL | Status: AC
  Administered 2020-08-10: 1000 mg via ORAL
  Filled 2020-08-10: qty 4

## 2020-08-10 NOTE — ED Provider Notes (Signed)
[facility] Provider Note: Georgena Spurling, MD, FACEP  CSN: [id number] MRN: [id number] ARRIVAL: 08/10/20 at 2207 ROOM: [place]  Wound Check   HISTORY OF PRESENT ILLNESS  08/10/20 11:11 PM Parker Odonnell. is a 86 y.o. 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  The area never completely healed, even though margins were clear of cancer, and his daughter took him to his dermatologist 4 days ago.  The dermatologist did extended the excision.  He has had persistent bleeding since the procedure and has had return to the dermatologist several times for dressing changes.  Today he developed pain at the site which she had not been having.  He rates the pain as an 8 out of 10, worse with movement or palpation.  He has not been having purulent drainage from the wound.  He has also developed pain in his right lower, lateral ribs which she attributes to being moved on and off of the surgical table at the plastic surgeons office.  He also has a cough which is fairly chronic and his daughter is concerned about this.  Of note he is on an ACE inhibitor.  He has not had a fever.  Past Medical History:  Diagnosis Date  . Arthritis   . ASCVD (arteriosclerotic cardiovascular disease)    single vessel  . Cancer ([place])   . CHF (congestive heart failure) ([place])   . Coronary atherosclerosis of native coronary artery   . Dyslipidemia   . ED (erectile dysfunction)   . GERD (gastroesophageal reflux disease)    hx of  . History of kidney stones   . Hypertension   . Inguinal hernia   . Left bundle branch block    chronic  . Postsurgical percutaneous transluminal coronary angioplasty status     Past Surgical History:  Procedure Laterality Date  . CARDIAC CATHETERIZATION  1998  . CARDIOVASCULAR STRESS TEST  05/25/12  . CHOLECYSTECTOMY  1979  . [place]  . INGUINAL HERNIA REPAIR  06/15/2012     Procedure: HERNIA REPAIR INGUINAL ADULT;  Surgeon: Adin Hector, MD;  Location: [facility];  Service: General;  Laterality: Bilateral;  Bilateral Inguinal Hernia Repair with Ultrapro Mesh  . MOHS SURGERY    . RIGHT/LEFT HEART CATH AND CORONARY ANGIOGRAPHY N/A 11/13/2019   Procedure: RIGHT/LEFT HEART CATH AND CORONARY ANGIOGRAPHY;  Surgeon: Jettie Booze, MD;  Location: [facility];  Service: Cardiovascular;  Laterality: N/A;  . TOTAL HIP ARTHROPLASTY Left 01/10/2020   Procedure: TOTAL HIP ARTHROPLASTY ANTERIOR APPROACH;  Surgeon: Gaynelle Arabian, MD;  Location: [facility];  Service: Orthopedics;  Laterality: Left;  157min    Family History  Problem Relation Age of Onset  . Arthritis Father   . Hypertension Paternal Uncle   . Heart attack Neg Hx   . Stroke Neg Hx     Social History   Tobacco Use  . Smoking status: Never Smoker  . Smokeless tobacco: Never Used  Vaping Use  . Vaping Use: Never used  Substance Use Topics  . Alcohol use: No  . Drug use: No    Prior to Admission medications   Medication Sig Start Date End Date Taking? Authorizing Provider  apixaban (ELIQUIS) 2.5 MG TABS tablet Take 1 tablet (2.5 mg total) by mouth 2 (two) times daily. 11/14/19   Jettie Booze, MD  atorvastatin (LIPITOR) 40 MG tablet  Take 1 tablet (40 mg total) by mouth daily at 6 PM. 11/14/19   Jettie Booze, MD  b complex vitamins tablet Take 1 tablet by mouth daily.    [provider]  carvedilol (COREG) 3.125 MG tablet TAKE 1 TABLET (3.125 MG TOTAL) BY MOUTH 2 (TWO) TIMES DAILY WITH A MEAL. Patient taking differently: Take 3.125 mg by mouth 2 (two) times daily with a meal.  11/14/19   Jettie Booze, MD  cephALEXin (KEFLEX) 500 MG capsule Take 1 capsule (500 mg total) by mouth 4 (four) times daily. 08/11/20   Baker Kogler, [place], MD  furosemide (LASIX) 20 MG tablet Take 1 tablet (20 mg total) by mouth daily. 06/09/18   Caren Griffins, MD  HYDROcodone-acetaminophen  (NORCO) 5-325 MG tablet Take 0.5-1 tablets by mouth every 6 (six) hours as needed (for rib pain). 08/11/20   Kalee Broxton, [place], MD  HYDROcodone-acetaminophen (NORCO/VICODIN) 5-325 MG tablet Take 1-2 tablets by mouth every 6 (six) hours as needed for severe pain. 01/12/20   Maurice March, PA-C  lisinopril (PRINIVIL,ZESTRIL) 20 MG tablet Take 1 tablet (20 mg total) by mouth daily. 06/09/18   Caren Griffins, MD  methocarbamol (ROBAXIN) 500 MG tablet Take 1 tablet (500 mg total) by mouth every 6 (six) hours as needed for muscle spasms. 01/12/20   Maurice March, PA-C  Multiple Vitamin (MULTIVITAMIN WITH MINERALS) TABS tablet Take 1 tablet by mouth every other day. [facility]  Spectravite Multivitamin    [provider]  Multiple Vitamins-Minerals (OCUVITE-LUTEIN PO) Take 1 capsule by mouth daily.    [provider]  Polyethyl Glycol-Propyl Glycol (SYSTANE) 0.4-0.3 % SOLN Apply 1 drop to eye 2 (two) times daily as needed (dry eyes).     [provider]  spironolactone (ALDACTONE) 25 MG tablet Take 0.5 tablets (12.5 mg total) by mouth daily. 06/09/18   Caren Griffins, MD  traMADol (ULTRAM) 50 MG tablet Take 1-2 tablets (50-100 mg total) by mouth every 6 (six) hours as needed for moderate pain. 01/12/20   Maurice March, PA-C    Allergies Patient has no known allergies.   REVIEW OF SYSTEMS  Negative except as noted here or in the History of Present Illness.   PHYSICAL EXAMINATION  Initial Vital Signs Blood pressure (!) 113/92, pulse 77, temperature 98.5 F (36.9 C), temperature source Oral, resp. rate 18, height 5\' 9"  (1.753 m), weight 64 kg, SpO2 100 %.  Examination General: Well-developed, well-nourished male in no acute distress; appearance consistent with age of record HENT: normocephalic; atraumatic Eyes: pupils equal, round and reactive to light; extraocular muscles intact Neck: supple Heart: regular rate and rhythm Lungs: clear to auscultation  bilaterally Chest: Right inferior, lateral rib tenderness Abdomen: soft; nondistended; nontender; bowel sounds present Extremities: No deformity; full range of motion; pulses normal Neurologic: Awake, alert and oriented; motor function intact in all extremities and symmetric; no facial droop Skin: Warm and dry; deep excision wound of right upper back with surrounding erythema and tenderness but no visible drainage, some granular hemostatic agent present in wound:    Psychiatric: Normal mood and affect   RESULTS  Summary of this visit's results, reviewed and interpreted by myself:   EKG Interpretation  Date/Time:    Ventricular Rate:    PR Interval:    QRS Duration:   QT Interval:    QTC Calculation:   R Axis:     Text Interpretation:        Laboratory Studies: No results found  for this or any previous visit (from the past 24 hour(s)). Imaging Studies: DG Ribs Unilateral W/Chest Right  Result Date: 08/11/2020 CLINICAL DATA:  Pain EXAM: RIGHT RIBS AND CHEST - 3+ VIEW COMPARISON:  None. FINDINGS: No fracture or other bone lesions are seen involving the ribs. There is no evidence of pneumothorax or pleural effusion. Small bilateral pleural effusions are seen. There is slight hyperinflation of the upper lung zones. Heart size and mediastinal contours are within normal limits. IMPRESSION: No displaced rib fractures. Small bilateral pleural effusions Electronically Signed   By: Prudencio Pair M.D.   On: 08/11/2020 00:03    ED COURSE and MDM  Nursing notes, initial and subsequent vitals signs, including pulse oximetry, reviewed and interpreted by myself.  Vitals:   08/10/20 2228  BP: (!) 113/92  Pulse: 77  Resp: 18  Temp: 98.5 F (36.9 C)  TempSrc: Oral  SpO2: 100%  Weight: 64 kg  Height: 5\' 9"  (1.753 m)   Medications  HYDROcodone-acetaminophen (NORCO/VICODIN) 5-325 MG per tablet 0.5 tablet (has no administration in time range)  cephALEXin (KEFLEX) capsule 1,000 mg (1,000  mg Oral Given 08/10/20 2355)    11:39 PM Wound dressed by nursing staff in the same manner used for grade 2 sacral decubitus ulcers.  Hopefully this will allow healing with out so frequent dressing changes.  We will also start him on Keflex for possible early wound infection given new-onset pain.   12:14 AM Although the patient's plain films are negative I have a high clinical suspicion of an occult rib fracture.  We will start him on a low-dose narcotic.  His dermatologist was planning to refer him to plastic surgery for possible skin graft.  Patient and daughter advised to discuss lisinopril with his PCP as it may be causing his chronic cough.   PROCEDURES  Procedures   ED DIAGNOSES     ICD-10-CM   1. Visit for wound check  Z51.89   2. Traumatic injury of rib  S29.9XXA        Orvie Caradine, Jenny Reichmann, MD 08/11/20 [phone]

## 2020-08-10 NOTE — ED Triage Notes (Addendum)
Pt's family reports pt had squamous cell carcinoma removed from his back near right shoulder blade in Feb of 2021. The area has not healed yet and this week the doctor removed more. Family reports the area continues to bleed and tonight he is having increased pain which is new.

## 2020-08-11 MED ORDER — CEPHALEXIN 500 MG PO CAPS: 500.0000 mg | ORAL_CAPSULE | Freq: Four times a day (QID) | ORAL | 0 refills | Status: DC

## 2020-08-11 MED ORDER — HYDROCODONE-ACETAMINOPHEN 5-325 MG PO TABS
0.5000 | ORAL_TABLET | Freq: Once | ORAL | Status: AC
  Administered 2020-08-11: 0.5 via ORAL
  Filled 2020-08-11: qty 1

## 2020-08-11 MED ORDER — HYDROCODONE-ACETAMINOPHEN 5-325 MG PO TABS: 0.5000 | ORAL_TABLET | Freq: Four times a day (QID) | ORAL | 0 refills | Status: DC | PRN

## 2020-08-11 NOTE — ED Notes (Signed)
Patient explained that they had an IS at home. Educated patient in the proper use of an IS and patient and daughter understood. Patient tolerated well.

## 2020-10-30 ENCOUNTER — Other Ambulatory Visit: Payer: Self-pay | Admitting: Interventional Cardiology

## 2020-12-04 ENCOUNTER — Other Ambulatory Visit: Payer: Self-pay | Admitting: Interventional Cardiology

## 2020-12-19 ENCOUNTER — Other Ambulatory Visit: Payer: Self-pay | Admitting: Interventional Cardiology

## 2020-12-19 MED ORDER — CARVEDILOL 3.125 MG PO TABS: ORAL_TABLET | ORAL | 0 refills | Status: DC

## 2020-12-19 MED ORDER — ATORVASTATIN CALCIUM 40 MG PO TABS: ORAL_TABLET | ORAL | 0 refills | Status: DC

## 2020-12-19 NOTE — Telephone Encounter (Signed)
*  STAT* If patient is at the pharmacy, call can be transferred to refill team.   1. Which medications need to be refilled? (please list name of each medication and dose if known) apixaban (ELIQUIS) 2.5 MG TABS tablet; atorvastatin (LIPITOR) 40 MG tablet; carvedilol (COREG) 3.125 MG tablet  2. Which pharmacy/location (including street and city if local pharmacy) is medication to be sent to? [facility] - [place], [place] - [place] [address]  3. Do they need a 30 day or 90 day supply? [place]

## 2020-12-19 NOTE — Telephone Encounter (Addendum)
Eliquis 2.5mg  refill request received. Patient is 87 years old, weight-64kg, Crea-0.91 on 01/12/2020, Diagnosis-Afib, and last seen by Dr. Irish Lack on 11/10/2019 & pending appt on 01/13/2021. Dose is inappropriate based on dosing criteria.    Eliquis dose was decreased as well per Dr. Hassell Done note on 06/10/2018; message to Dr. Irish Lack

## 2020-12-23 MED ORDER — APIXABAN 2.5 MG PO TABS: 2.5000 mg | ORAL_TABLET | Freq: Two times a day (BID) | ORAL | 0 refills | Status: DC

## 2020-12-23 NOTE — Telephone Encounter (Signed)
Spoke with Dr. Irish Lack in the office today and he stated to continue with Eliquis 2.5mg  BID dose at this time; he will evaluate pt on 01/13/21 at the OV appt. Pt has only requested a 30 day supply, therefore, sent as requested to [facility].

## 2021-01-13 ENCOUNTER — Ambulatory Visit: Payer: Medicare Other | Admitting: Interventional Cardiology

## 2021-01-15 ENCOUNTER — Ambulatory Visit (INDEPENDENT_AMBULATORY_CARE_PROVIDER_SITE_OTHER): Payer: Medicare Other | Admitting: Interventional Cardiology

## 2021-01-15 ENCOUNTER — Other Ambulatory Visit: Payer: Self-pay

## 2021-01-15 ENCOUNTER — Encounter: Payer: Self-pay | Admitting: Interventional Cardiology

## 2021-01-15 VITALS — BP 116/62 | HR 67 | Ht 68.0 in | Wt 145.0 lb

## 2021-01-15 DIAGNOSIS — I5022 Chronic systolic (congestive) heart failure: Secondary | ICD-10-CM

## 2021-01-15 DIAGNOSIS — I4821 Permanent atrial fibrillation: Secondary | ICD-10-CM

## 2021-01-15 DIAGNOSIS — I447 Left bundle-branch block, unspecified: Secondary | ICD-10-CM | POA: Diagnosis not present

## 2021-01-15 DIAGNOSIS — I2510 Atherosclerotic heart disease of native coronary artery without angina pectoris: Secondary | ICD-10-CM

## 2021-01-15 NOTE — Progress Notes (Signed)
Cardiology Office Note   Date:  01/15/2021   ID:  Parker Viney., DOB Mar 07, 1932, MRN [id number]  PCP:  Lawerance Cruel, MD    No chief complaint on file.  CAD  Wt Readings from Last 3 Encounters:  01/15/21 145 lb (65.8 kg)  08/10/20 141 lb (64 kg)  01/10/20 136 lb 7.4 oz (61.9 kg)       History of Present Illness: Parker Shein. is a 87 y.o. male  who had an LAD stent in 1997.  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 He now lives in an apartment after his wife passed away a few years ago.   Hernia surgery in 2013 went well. No cardiac issues at that time. No ischemia by stress test at that time.  He was noted to have an irregular heart beat in early 2019 with his PMD. He was referred here to evaluate.  In March 2019, echo:  Left ventricle: The cavity size was normal. Wall thickness was increased in a pattern of mild LVH. There was mild concentric hypertrophy. Systolic function was moderately reduced. The estimated ejection fraction was in the range of 35% to 40%. Moderate diffuse hypokinesis with regional variations. There appears to be disproportionate hypokinesis of the apex and the inferolateral wall. ; in the distribution of multiple vessels. No evidence of thrombus. - Ventricular septum: Septal motion showed abnormal function, dyssynergy, and paradox. - Mitral valve: There was mild regurgitation. - Left atrium: The atrium was moderately to severely dilated. - Right ventricle: The cavity size was mildly dilated. - Right atrium: The atrium was moderately to severely dilated. - Atrial septum: No defect or patent foramen ovale was identified. - Tricuspid valve: There was mild-moderate regurgitation directed centrally. - Pulmonary arteries: PA peak pressure: 42 mm Hg (S).  He was hospitalized in 2019 for CHF: "Acute systolic Heart failure exacerbation Last EF 35-40%, cardiology  consulted and followed patient. He was initially maintained on IV Lasix, fluid status improved and was converted to po. He was also started on Spironolactone, and his HCTZ was discontinued.Repeat ECHO. Lower EF 25--30 %. He was net negative 7 L, dyspnea has resolved and patient returned to baseline."  Eliquis dose was decreased as well. EF was 25-30%.  At the visit in 8/19 we discussed the following: "Known LAD stent in 1997. Ejection fraction was normal in 2004. Now, given decreased ejection fraction and heart failure, would need to consider left and right heart cath. This was discussed with the patient and his daughter. They were also considering hip replacement for the patient. Prior to clearing him for hip replacement, I think he would need a heart cath. They will consider this and get back to [place] with a decision.."  CAth in Jan 2021 showed:  "Ost Cx to Prox Cx lesion is 25% stenosed.  1st Mrg lesion is 25% stenosed.  Mid LAD-1 lesion is 40% stenosed.  Previously placed Mid LAD-2 stent (unknown type) is widely patent.  There is mild left ventricular systolic dysfunction.  The left ventricular ejection fraction is 35-45% by visual estimate.  LV end diastolic pressure is normal. Normal right heart pressures.  There is no aortic valve stenosis.  Ao sat 98%, PA sat 76%, mean PA 22 mm Hg; mean PCWP 11 mm Hg; CO 6.0 L/min; CI 3.4   Mild, nonobstructive, scattered CAD.  Nonischemic cardiomyopathy. "  Denies : Chest pain. Dizziness. Leg  edema. Nitroglycerin use. Orthopnea. Palpitations. Paroxysmal nocturnal dyspnea. Shortness of breath. Syncope.   Hip pain has resolved.  Walking with cane and balance is good. Exercises at a gym.  Past Medical History:  Diagnosis Date  . Arthritis   . ASCVD (arteriosclerotic cardiovascular disease)    single vessel  . Cancer ([facility])   . CHF (congestive heart failure) ([facility])   . Coronary atherosclerosis of native coronary artery   .  Dyslipidemia   . ED (erectile dysfunction)   . GERD (gastroesophageal reflux disease)    hx of  . History of kidney stones   . Hypertension   . Inguinal hernia   . Left bundle branch block    chronic  . Postsurgical percutaneous transluminal coronary angioplasty status     Past Surgical History:  Procedure Laterality Date  . CARDIAC CATHETERIZATION  1998  . CARDIOVASCULAR STRESS TEST  05/25/12  . CHOLECYSTECTOMY  1979  . [place]  . INGUINAL HERNIA REPAIR  06/15/2012   Procedure: HERNIA REPAIR INGUINAL ADULT;  Surgeon: Adin Hector, MD;  Location: [facility];  Service: General;  Laterality: Bilateral;  Bilateral Inguinal Hernia Repair with Ultrapro Mesh  . MOHS SURGERY    . RIGHT/LEFT HEART CATH AND CORONARY ANGIOGRAPHY N/A 11/13/2019   Procedure: RIGHT/LEFT HEART CATH AND CORONARY ANGIOGRAPHY;  Surgeon: Jettie Booze, MD;  Location: [facility];  Service: Cardiovascular;  Laterality: N/A;  . TOTAL HIP ARTHROPLASTY Left 01/10/2020   Procedure: TOTAL HIP ARTHROPLASTY ANTERIOR APPROACH;  Surgeon: Gaynelle Arabian, MD;  [facility];  Service: Orthopedics;  Laterality: Left;  112min     Current Outpatient Medications  Medication Sig Dispense Refill  . apixaban (ELIQUIS) 2.5 MG TABS tablet Take 1 tablet (2.5 mg total) by mouth 2 (two) times daily. 60 tablet 0  . atorvastatin (LIPITOR) 40 MG tablet TAKE ONE TABLET BY MOUTH EVERYDAY AT BEDTIME 30 tablet 0  . b complex vitamins tablet Take 1 tablet by mouth daily.    . carvedilol (COREG) 3.125 MG tablet TAKE ONE TABLET BY MOUTH EVERY MORNING and TAKE ONE TABLET BY MOUTH EVERYDAY AT BEDTIME 60 tablet 0  . cephALEXin (KEFLEX) 500 MG capsule Take 1 capsule (500 mg total) by mouth 4 (four) times daily. 28 capsule 0  . furosemide (LASIX) 20 MG tablet Take 1 tablet (20 mg total) by mouth daily. 30 tablet 0  . HYDROcodone-acetaminophen (NORCO) 5-325 MG tablet Take 0.5-1 tablets by mouth every 6 (six) hours as  needed (for rib pain). 12 tablet 0  . HYDROcodone-acetaminophen (NORCO/VICODIN) 5-325 MG tablet Take 1-2 tablets by mouth every 6 (six) hours as needed for severe pain. 42 tablet 0  . lisinopril (PRINIVIL,ZESTRIL) 20 MG tablet Take 1 tablet (20 mg total) by mouth daily. 30 tablet 0  . methocarbamol (ROBAXIN) 500 MG tablet Take 1 tablet (500 mg total) by mouth every 6 (six) hours as needed for muscle spasms. 40 tablet 0  . Multiple Vitamin (MULTIVITAMIN WITH MINERALS) TABS tablet Take 1 tablet by mouth every other day. [facility]  Spectravite Multivitamin    . Multiple Vitamins-Minerals (OCUVITE-LUTEIN PO) Take 1 capsule by mouth daily.    Vladimir Faster Glycol-Propyl Glycol 0.4-0.3 % SOLN Apply 1 drop to eye 2 (two) times daily as needed (dry eyes).     Marland Kitchen spironolactone (ALDACTONE) 25 MG tablet Take 0.5 tablets (12.5 mg total) by mouth daily. 30 tablet 0  . traMADol (ULTRAM) 50 MG tablet Take 1-2 tablets (50-100 mg  total) by mouth every 6 (six) hours as needed for moderate pain. 42 tablet 0   No current facility-administered medications for this visit.    Allergies:   Patient has no known allergies.    Social History:  The patient  reports that he has never smoked. He has never used smokeless tobacco. He reports that he does not drink alcohol and does not use drugs.   Family History:  The patient's family history includes Arthritis in his father; Hypertension in his paternal uncle.    ROS:  Please see the history of present illness.   Otherwise, review of systems are positive for using cane for walking.   All other systems are reviewed and negative.    PHYSICAL EXAM: VS:  BP 116/62   Pulse 67   Ht 5\' 8"  (1.727 m)   Wt 145 lb (65.8 kg)   BMI 22.05 kg/m  , BMI Body mass index is 22.05 kg/m. GEN: Well nourished, well developed, in no acute distress  HEENT: normal  Neck: no JVD, carotid bruits, or masses Cardiac: RRR; no murmurs, rubs, or gallops,no edema  Respiratory:  clear to auscultation  bilaterally, normal work of breathing GI: soft, nontender, nondistended, + BS MS: no deformity or atrophy  Skin: warm and dry, no rash Neuro:  Strength and sensation are intact Psych: euthymic mood, full affect   EKG:   The ekg ordered today demonstrates NSR, prolonged PR, LBBB   Recent Labs: No results found for requested labs within last 8760 hours.   Lipid Panel    Component Value Date/Time   CHOL 104 12/23/2016 0804   CHOL 110 01/04/2014 0822   TRIG 62 12/23/2016 0804   TRIG 72 01/04/2014 0822   HDL 47 12/23/2016 0804   HDL 40 01/04/2014 0822   CHOLHDL 2.2 12/23/2016 0804   CHOLHDL 2.4 12/19/2015 0825   VLDL 11 12/19/2015 0825   LDLCALC 45 12/23/2016 0804   LDLCALC 56 01/04/2014 0822     Other studies Reviewed: Additional studies/ records that were reviewed today with results demonstrating: LDL 45.   ASSESSMENT AND PLAN:  1.   CAD: Aggressive secondary prevention.  He continues to try to exercise safely.  Avoid falls. 2.   Hyperlipidemia: The current medical regimen is effective;  continue present plan and medications.  Continue lipid-lowering therapy. 3.   CHronic systolic heart failure: He appears euvolemic. AFib: Eliquis for stroke prevention.  Currently in sinus rhythm. LBBB- chronic .   Current medicines are reviewed at length with the patient today.  The patient concerns regarding his medicines were addressed.  The following changes have been made:  No change  Labs/ tests ordered today include:  No orders of the defined types were placed in this encounter.   Recommend 150 minutes/week of aerobic exercise Low fat, low carb, high fiber diet recommended  Disposition:   FU in 1 year   Signed, Larae Grooms, MD  01/15/2021 3:24 PM    [facility] [address], [place], [place]  [address] Phone: [phone]; Fax: [phone]

## 2021-01-15 NOTE — Patient Instructions (Signed)

## 2021-01-31 ENCOUNTER — Other Ambulatory Visit: Payer: Self-pay | Admitting: Interventional Cardiology

## 2021-01-31 NOTE — Telephone Encounter (Signed)
Age 87, weight 65.8kg, SCr 1.03 on 09/24/20 at [facility], qualifies for 5mg  BID dosing but currently on 2.5mg  BID dosing. See below - RN discussed with MD last month who preferred for pt to continue on 2.5mg  BID dosing.  Last visit 01/15/21, afib indication   December 23, 2020  [place], [place] B, RN     10:57 AM Note Spoke with Dr. Irish Lack in the office today and he stated to continue with Eliquis 2.5mg  BID dose at this time; he will evaluate pt on 01/13/21 at the OV appt. Pt has only requested a 30 day supply, therefore, sent as requested to [facility].     December 19, 2020  [place], [place] B, RN     10:32 AM Note Eliquis 2.5mg  refill request received. Patient is 87 years old, weight-64kg, Crea-0.91 on 01/12/2020, Diagnosis-Afib, and last seen by Dr. Irish Lack on 11/10/2019 & pending appt on 01/13/2021. Dose is inappropriate based on dosing criteria.    Eliquis dose was decreased as well per Dr. Hassell Done note on 06/10/2018; message to Dr. Irish Lack

## 2021-06-01 ENCOUNTER — Emergency Department (HOSPITAL_COMMUNITY): Payer: Medicare Other

## 2021-06-01 ENCOUNTER — Other Ambulatory Visit: Payer: Self-pay

## 2021-06-01 ENCOUNTER — Emergency Department (HOSPITAL_COMMUNITY)
Admission: EM | Admit: 2021-06-01 | Discharge: 2021-06-02 | Disposition: A | Discharge status: Home / Self Care | Payer: Medicare Other | Attending: Emergency Medicine | Admitting: Emergency Medicine

## 2021-06-01 DIAGNOSIS — R519 Headache, unspecified: Secondary | ICD-10-CM | POA: Insufficient documentation

## 2021-06-01 DIAGNOSIS — I2510 Atherosclerotic heart disease of native coronary artery without angina pectoris: Secondary | ICD-10-CM | POA: Insufficient documentation

## 2021-06-01 DIAGNOSIS — Z955 Presence of coronary angioplasty implant and graft: Secondary | ICD-10-CM | POA: Diagnosis not present

## 2021-06-01 DIAGNOSIS — Z7901 Long term (current) use of anticoagulants: Secondary | ICD-10-CM | POA: Diagnosis not present

## 2021-06-01 DIAGNOSIS — I110 Hypertensive heart disease with heart failure: Secondary | ICD-10-CM | POA: Diagnosis not present

## 2021-06-01 DIAGNOSIS — Z79899 Other long term (current) drug therapy: Secondary | ICD-10-CM | POA: Diagnosis not present

## 2021-06-01 DIAGNOSIS — Y9289 Other specified places as the place of occurrence of the external cause: Secondary | ICD-10-CM | POA: Insufficient documentation

## 2021-06-01 DIAGNOSIS — M79604 Pain in right leg: Secondary | ICD-10-CM

## 2021-06-01 DIAGNOSIS — Z859 Personal history of malignant neoplasm, unspecified: Secondary | ICD-10-CM | POA: Insufficient documentation

## 2021-06-01 DIAGNOSIS — I4892 Unspecified atrial flutter: Secondary | ICD-10-CM | POA: Insufficient documentation

## 2021-06-01 DIAGNOSIS — Z96642 Presence of left artificial hip joint: Secondary | ICD-10-CM | POA: Diagnosis not present

## 2021-06-01 DIAGNOSIS — W010XXA Fall on same level from slipping, tripping and stumbling without subsequent striking against object, initial encounter: Secondary | ICD-10-CM | POA: Diagnosis not present

## 2021-06-01 DIAGNOSIS — I5023 Acute on chronic systolic (congestive) heart failure: Secondary | ICD-10-CM | POA: Insufficient documentation

## 2021-06-01 DIAGNOSIS — M25561 Pain in right knee: Secondary | ICD-10-CM | POA: Diagnosis not present

## 2021-06-01 MED ORDER — TRAMADOL HCL 50 MG PO TABS
25.0000 mg | ORAL_TABLET | Freq: Once | ORAL | Status: AC
  Administered 2021-06-01: 25 mg via ORAL
  Filled 2021-06-01: qty 1

## 2021-06-01 MED ORDER — TRAMADOL HCL 50 MG PO TABS: 50.0000 mg | ORAL_TABLET | Freq: Four times a day (QID) | ORAL | 0 refills | Status: DC | PRN

## 2021-06-01 MED ORDER — VALACYCLOVIR HCL 1 G PO TABS: 1000.0000 mg | ORAL_TABLET | Freq: Three times a day (TID) | ORAL | 0 refills | Status: DC

## 2021-06-01 MED ORDER — ACETAMINOPHEN 325 MG PO TABS
650.0000 mg | ORAL_TABLET | Freq: Once | ORAL | Status: AC
  Administered 2021-06-01: 650 mg via ORAL
  Filled 2021-06-01: qty 2

## 2021-06-01 NOTE — ED Provider Notes (Signed)
[facility] DEPT Provider Note   CSN: [id number] Arrival date & time: 06/01/21  2053     History No chief complaint on file.   Parker Odonnell. is a 87 y.o. male.  Patient presents was independent living facility complaining of right knee and right lower extremity pain.  He states he was in his room and his leg gave out due to pain and he slid down to the ground.  Denies hard fall or injury.  Denies pain elsewhere.  Denies headache or back pain or neck pain.  Denies loss of consciousness.  No reports of fevers or cough or vomiting or diarrhea.      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   Cancer (HCC)    CHF (congestive heart failure) ([place])    Coronary atherosclerosis of native coronary artery    Dyslipidemia    ED (erectile dysfunction)    GERD (gastroesophageal reflux disease)    hx of   History of kidney stones    Hypertension    Inguinal hernia    Left bundle branch block    chronic   Postsurgical percutaneous transluminal coronary angioplasty status     Patient Active Problem List   Diagnosis Date Noted   OA (osteoarthritis) of hip 01/10/2020   Primary osteoarthritis of left hip 01/10/2020   Preoperative cardiovascular examination    Shortness of breath    Bilateral pleural effusion    Thrombocytopenia ([place]) 06/03/2018   Atrial flutter ([facility]) 06/03/2018   Acute on chronic systolic CHF (congestive heart failure) ([place]) 06/03/2018   Bilateral edema of lower extremity    Essential hypertension 09/19/2015   LBBB (left bundle branch block) 09/19/2015   Hypokalemia 08/27/2014   Hypertension    ASCVD (arteriosclerotic cardiovascular disease)    Dyslipidemia    ED (erectile dysfunction)    Left bundle branch block    Postsurgical percutaneous transluminal coronary angioplasty status    Coronary atherosclerosis of native coronary artery    Bilateral  inguinal hernia 06/15/2012    Past Surgical History:  Procedure Laterality Date   CARDIAC CATHETERIZATION  1998   CARDIOVASCULAR STRESS TEST  05/25/12   CHOLECYSTECTOMY  1979   CORONARY STENT PLACEMENT  1997   INGUINAL HERNIA REPAIR  06/15/2012   Procedure: HERNIA REPAIR INGUINAL ADULT;  Surgeon: Adin Hector, MD;  Location: [facility];  Service: General;  Laterality: Bilateral;  Bilateral Inguinal Hernia Repair with Ultrapro Mesh   MOHS SURGERY     RIGHT/LEFT HEART CATH AND CORONARY ANGIOGRAPHY N/A 11/13/2019   Procedure: RIGHT/LEFT HEART CATH AND CORONARY ANGIOGRAPHY;  Surgeon: Jettie Booze, MD;  Location: [facility];  Service: Cardiovascular;  Laterality: N/A;   TOTAL HIP ARTHROPLASTY Left 01/10/2020   Procedure: TOTAL HIP ARTHROPLASTY ANTERIOR APPROACH;  Surgeon: Gaynelle Arabian, MD;  Location: [facility];  Service: Orthopedics;  Laterality: Left;  149mn       Family History  Problem Relation Age of Onset   Arthritis Father    Hypertension Paternal Uncle    Heart attack Neg Hx    Stroke Neg Hx     Social History   Tobacco Use   Smoking status: Never   Smokeless tobacco: Never  Vaping Use   Vaping Use: Never used  Substance Use Topics   Alcohol use: No   Drug use: No    Home Medications Prior to Admission medications  Medication Sig Start Date End Date Taking? Authorizing Provider  traMADol (ULTRAM) 50 MG tablet Take 1 tablet (50 mg total) by mouth every 6 (six) hours as needed for up to 10 doses. 06/01/21  Yes Luna Fuse, MD  valACYclovir (VALTREX) 1000 MG tablet Take 1 tablet (1,000 mg total) by mouth 3 (three) times daily. 06/01/21  Yes Luna Fuse, MD  atorvastatin (LIPITOR) 40 MG tablet TAKE ONE TABLET BY MOUTH EVERYDAY AT BEDTIME 01/31/21   Jettie Booze, MD  b complex vitamins tablet Take 1 tablet by mouth daily.    [provider]  carvedilol (COREG) 3.125 MG tablet TAKE ONE TABLET BY MOUTH EVERY MORNING and TAKE ONE TABLET BY MOUTH  EVERYDAY AT BEDTIME 01/31/21   Jettie Booze, MD  cephALEXin (KEFLEX) 500 MG capsule Take 1 capsule (500 mg total) by mouth 4 (four) times daily. 08/11/20   Molpus, Crimson, MD  ELIQUIS 2.5 MG TABS tablet TAKE ONE TABLET BY MOUTH EVERY MORNING and TAKE ONE TABLET BY MOUTH EVERYDAY AT BEDTIME 01/31/21   Jettie Booze, MD  furosemide (LASIX) 20 MG tablet Take 1 tablet (20 mg total) by mouth daily. 06/09/18   Caren Griffins, MD  HYDROcodone-acetaminophen (NORCO) 5-325 MG tablet Take 0.5-1 tablets by mouth every 6 (six) hours as needed (for rib pain). 08/11/20   Molpus, Suhan, MD  HYDROcodone-acetaminophen (NORCO/VICODIN) 5-325 MG tablet Take 1-2 tablets by mouth every 6 (six) hours as needed for severe pain. 01/12/20   Irving Copas, PA-C  lisinopril (PRINIVIL,ZESTRIL) 20 MG tablet Take 1 tablet (20 mg total) by mouth daily. 06/09/18   Caren Griffins, MD  methocarbamol (ROBAXIN) 500 MG tablet Take 1 tablet (500 mg total) by mouth every 6 (six) hours as needed for muscle spasms. 01/12/20   Irving Copas, PA-C  Multiple Vitamin (MULTIVITAMIN WITH MINERALS) TABS tablet Take 1 tablet by mouth every other day. [facility]  Spectravite Multivitamin    [provider]  Multiple Vitamins-Minerals (OCUVITE-LUTEIN PO) Take 1 capsule by mouth daily.    [provider]  Polyethyl Glycol-Propyl Glycol 0.4-0.3 % SOLN Apply 1 drop to eye 2 (two) times daily as needed (dry eyes).     [provider]  spironolactone (ALDACTONE) 25 MG tablet Take 0.5 tablets (12.5 mg total) by mouth daily. 06/09/18   Caren Griffins, MD    Allergies    Patient has no known allergies.  Review of Systems   Review of Systems  Constitutional:  Negative for fever.  HENT:  Negative for ear pain and sore throat.   Eyes:  Negative for pain.  Respiratory:  Negative for cough.   Cardiovascular:  Negative for chest pain.  Gastrointestinal:  Negative for abdominal pain.  Genitourinary:  Negative for  flank pain.  Musculoskeletal:  Negative for back pain.  Skin:  Negative for color change and rash.  Neurological:  Negative for syncope.  All other systems reviewed and are negative.  Physical Exam Updated Vital Signs BP (!) 159/72   Pulse 60   Temp 98 F (36.7 C) (Oral)   Resp 20   Ht '5\' 8"'$  (1.727 m)   Wt 66.2 kg   SpO2 98%   BMI 22.20 kg/m   Physical Exam Constitutional:      Appearance: He is well-developed.  HENT:     Head: Normocephalic.     Nose: Nose normal.  Eyes:     Extraocular Movements: Extraocular movements intact.  Cardiovascular:  Rate and Rhythm: Normal rate.  Pulmonary:     Effort: Pulmonary effort is normal.  Musculoskeletal:     Comments: Mild swelling noted at the right knee.  No other gross deformity noted.  No cellulitis or abnormal warmth noted.  Moderate discomfort with range of motion of the right knee.  No anterior posterior laxity no varus or valgus laxity noted of the knee.  Otherwise neurovascularly intact bilateral upper and lower extremities.   Skin:    Coloration: Skin is not jaundiced.     Comments: Vesicular rash seen on the anterior shin of the right lower extremity mid tibial region.  Neurological:     Mental Status: He is alert. Mental status is at baseline.    ED Results / Procedures / Treatments   Labs (all labs ordered are listed, but only abnormal results are displayed) Labs Reviewed - No data to display  EKG None  Radiology CT HEAD WO CONTRAST (5MM)  Result Date: 06/01/2021 CLINICAL DATA:  Recent fall with head injury, currently using Eliquis EXAM: CT HEAD WITHOUT CONTRAST TECHNIQUE: Contiguous axial images were obtained from the base of the skull through the vertex without intravenous contrast. COMPARISON:  None. FINDINGS: Brain: No evidence of acute infarction, hemorrhage, hydrocephalus, extra-axial collection or mass lesion/mass effect. Chronic atrophic and ischemic changes are noted. Vascular: No hyperdense vessel or  unexpected calcification. Skull: Normal. Negative for fracture or focal lesion. Sinuses/Orbits: No acute finding. Other: None. IMPRESSION: Chronic atrophic and ischemic changes without acute abnormality. Electronically Signed   By: Inez Catalina M.D.   On: 06/01/2021 22:44   DG Knee Complete 4 Views Right  Result Date: 06/01/2021 CLINICAL DATA:  Recent fall with right knee pain, initial encounter EXAM: RIGHT KNEE - COMPLETE 4+ VIEW COMPARISON:  None. FINDINGS: Tricompartmental degenerative changes are noted. No acute fracture or dislocation is noted. No joint effusion is seen. IMPRESSION: Degenerative change without acute bony abnormality. Electronically Signed   By: Inez Catalina M.D.   On: 06/01/2021 23:00   DG Hip Unilat With Pelvis 2-3 Views Right  Result Date: 06/01/2021 CLINICAL DATA:  Recent fall with right leg pain, initial encounter EXAM: DG HIP (WITH OR WITHOUT PELVIS) 3V RIGHT COMPARISON:  None. FINDINGS: Pelvic ring is intact. Left hip replacement is noted. Significant degenerative changes of the right hip joint are seen. No acute fracture or dislocation is noted. No soft tissue abnormality is seen. IMPRESSION: Degenerative change of the right hip joint. No acute bony abnormality noted. Electronically Signed   By: Inez Catalina M.D.   On: 06/01/2021 23:01    Procedures Procedures   Medications Ordered in ED Medications  acetaminophen (TYLENOL) tablet 650 mg (650 mg Oral Given 06/01/21 2215)  traMADol (ULTRAM) tablet 25 mg (25 mg Oral Given 06/01/21 2215)    ED Course  I have reviewed the triage vital signs and the nursing notes.  Pertinent labs & imaging results that were available during my care of the patient were reviewed by me and considered in my medical decision making (see chart for details).    MDM Rules/Calculators/A&P                           X-rays show extensive degenerative joint disease of the knees and hip but no acute fracture or acute pathology noted.  CT  imaging of the brain otherwise unremarkable as well. Patient given pain medication here for his knee pain.  Given negative work-up I suspect  arthritic changes as cause of his pain.  Rash of the right lower extremity appears consistent with possible shingles.  Patient denies having knowledge of the rash prior to arrival here.  Denies any pain at the site of the rash.   Final Clinical Impression(s) / ED Diagnoses Final diagnoses:  Acute pain of right lower extremity    Rx / DC Orders ED Discharge Orders          Ordered    valACYclovir (VALTREX) 1000 MG tablet  3 times daily        06/01/21 2331    traMADol (ULTRAM) 50 MG tablet  Every 6 hours PRN        06/01/21 2331             Luna Fuse, MD 06/01/21 [id number]

## 2021-06-01 NOTE — Discharge Instructions (Addendum)
Call your primary care doctor or specialist as discussed in the next 2-3 days.   Return immediately back to the ER if:  Your symptoms worsen within the next 12-24 hours. You develop new symptoms such as new fevers, persistent vomiting, new pain, shortness of breath, or new weakness or numbness, or if you have any other concerns.  

## 2021-06-01 NOTE — ED Triage Notes (Signed)
Pt to ED via EMS from independent living [facility]. Pt was Having right knee pain, gave out and had a mechanical ground level fall. No LOC. Pt did not hit head. Pt does c/o of right knee pain. Pt on blood thinners, takes Elequis. No obvious injuries noted by EMS. Last VS: 138/78, PULSE 74, R 18,  97% . No medications given by EMS.

## 2021-06-09 ENCOUNTER — Telehealth: Payer: Self-pay | Admitting: Interventional Cardiology

## 2021-06-09 NOTE — Telephone Encounter (Signed)
Spoke with Parker Odonnell the pts daughter and she reports that the pt has been diagnosed with shingles and has rashes on both of his lower extremities but since taking his anti-viral meds which has been completed yesterday.. he has been having extreme weakness and unable to walk and just does not feel well.   His PCP has recommended that she take him back to the ED but she just wanted to check and see if Dr. Irish Lack could do anything to facilitate his visit to the ED... I advise her that he is out of the office and if he could put a note in it may not make a change to their visit but it may be best to take him now  since it has been advised. She agreed.   I also asked her to let us know how he does and when he gets better from his shingles  we can make him an appt to come back and be seen at our office for cardiac follow up.

## 2021-06-09 NOTE — Telephone Encounter (Signed)
Pt went to [facility] ED on Sunday 06/01/21 for weakness, pt was diagnosed with Shingles. Daughter Parker Odonnell is calling to see if she should take pt back to the ED for the weakness because pt have not gotten any better since leaving [facility] ED. Please contact Parker Odonnell  (Emergency Contact) at [phone]

## 2021-06-10 ENCOUNTER — Encounter (HOSPITAL_COMMUNITY): Payer: Self-pay

## 2021-06-10 ENCOUNTER — Other Ambulatory Visit: Payer: Self-pay

## 2021-06-10 ENCOUNTER — Inpatient Hospital Stay (HOSPITAL_COMMUNITY)
Admission: EM | Admit: 2021-06-10 | Discharge: 2021-06-25 | DRG: 075 | Disposition: A | Discharge status: Rehabilitation Facility | Payer: Medicare Other | Attending: Internal Medicine | Admitting: Internal Medicine

## 2021-06-10 DIAGNOSIS — Z9181 History of falling: Secondary | ICD-10-CM

## 2021-06-10 DIAGNOSIS — Z66 Do not resuscitate: Secondary | ICD-10-CM | POA: Diagnosis present

## 2021-06-10 DIAGNOSIS — I447 Left bundle-branch block, unspecified: Secondary | ICD-10-CM | POA: Diagnosis present

## 2021-06-10 DIAGNOSIS — Z9049 Acquired absence of other specified parts of digestive tract: Secondary | ICD-10-CM

## 2021-06-10 DIAGNOSIS — N401 Enlarged prostate with lower urinary tract symptoms: Secondary | ICD-10-CM | POA: Diagnosis not present

## 2021-06-10 DIAGNOSIS — Z85828 Personal history of other malignant neoplasm of skin: Secondary | ICD-10-CM

## 2021-06-10 DIAGNOSIS — M8448XA Pathological fracture, other site, initial encounter for fracture: Secondary | ICD-10-CM | POA: Diagnosis present

## 2021-06-10 DIAGNOSIS — E871 Hypo-osmolality and hyponatremia: Secondary | ICD-10-CM

## 2021-06-10 DIAGNOSIS — Z8261 Family history of arthritis: Secondary | ICD-10-CM

## 2021-06-10 DIAGNOSIS — R911 Solitary pulmonary nodule: Secondary | ICD-10-CM | POA: Diagnosis present

## 2021-06-10 DIAGNOSIS — R0602 Shortness of breath: Secondary | ICD-10-CM

## 2021-06-10 DIAGNOSIS — I4892 Unspecified atrial flutter: Secondary | ICD-10-CM | POA: Diagnosis present

## 2021-06-10 DIAGNOSIS — Z8249 Family history of ischemic heart disease and other diseases of the circulatory system: Secondary | ICD-10-CM

## 2021-06-10 DIAGNOSIS — H3581 Retinal edema: Secondary | ICD-10-CM | POA: Diagnosis present

## 2021-06-10 DIAGNOSIS — I10 Essential (primary) hypertension: Secondary | ICD-10-CM | POA: Diagnosis present

## 2021-06-10 DIAGNOSIS — M8588 Other specified disorders of bone density and structure, other site: Secondary | ICD-10-CM | POA: Diagnosis present

## 2021-06-10 DIAGNOSIS — M4856XA Collapsed vertebra, not elsewhere classified, lumbar region, initial encounter for fracture: Secondary | ICD-10-CM | POA: Diagnosis present

## 2021-06-10 DIAGNOSIS — I4819 Other persistent atrial fibrillation: Secondary | ICD-10-CM | POA: Diagnosis present

## 2021-06-10 DIAGNOSIS — H548 Legal blindness, as defined in USA: Secondary | ICD-10-CM | POA: Diagnosis present

## 2021-06-10 DIAGNOSIS — E222 Syndrome of inappropriate secretion of antidiuretic hormone: Secondary | ICD-10-CM | POA: Diagnosis present

## 2021-06-10 DIAGNOSIS — G8220 Paraplegia, unspecified: Secondary | ICD-10-CM | POA: Diagnosis present

## 2021-06-10 DIAGNOSIS — I2510 Atherosclerotic heart disease of native coronary artery without angina pectoris: Secondary | ICD-10-CM | POA: Diagnosis present

## 2021-06-10 DIAGNOSIS — N1339 Other hydronephrosis: Secondary | ICD-10-CM | POA: Diagnosis not present

## 2021-06-10 DIAGNOSIS — Z79899 Other long term (current) drug therapy: Secondary | ICD-10-CM

## 2021-06-10 DIAGNOSIS — K219 Gastro-esophageal reflux disease without esophagitis: Secondary | ICD-10-CM | POA: Diagnosis present

## 2021-06-10 DIAGNOSIS — Z87442 Personal history of urinary calculi: Secondary | ICD-10-CM

## 2021-06-10 DIAGNOSIS — H3530 Unspecified macular degeneration: Secondary | ICD-10-CM | POA: Diagnosis present

## 2021-06-10 DIAGNOSIS — N179 Acute kidney failure, unspecified: Secondary | ICD-10-CM | POA: Diagnosis not present

## 2021-06-10 DIAGNOSIS — B021 Zoster meningitis: Secondary | ICD-10-CM | POA: Diagnosis not present

## 2021-06-10 DIAGNOSIS — Z7901 Long term (current) use of anticoagulants: Secondary | ICD-10-CM

## 2021-06-10 DIAGNOSIS — I776 Arteritis, unspecified: Secondary | ICD-10-CM

## 2021-06-10 DIAGNOSIS — Z96642 Presence of left artificial hip joint: Secondary | ICD-10-CM | POA: Diagnosis present

## 2021-06-10 DIAGNOSIS — E860 Dehydration: Secondary | ICD-10-CM | POA: Diagnosis present

## 2021-06-10 DIAGNOSIS — R531 Weakness: Secondary | ICD-10-CM

## 2021-06-10 DIAGNOSIS — I110 Hypertensive heart disease with heart failure: Secondary | ICD-10-CM | POA: Diagnosis present

## 2021-06-10 DIAGNOSIS — Z20822 Contact with and (suspected) exposure to covid-19: Secondary | ICD-10-CM | POA: Diagnosis present

## 2021-06-10 DIAGNOSIS — I428 Other cardiomyopathies: Secondary | ICD-10-CM | POA: Diagnosis present

## 2021-06-10 DIAGNOSIS — R21 Rash and other nonspecific skin eruption: Secondary | ICD-10-CM | POA: Diagnosis present

## 2021-06-10 DIAGNOSIS — Z955 Presence of coronary angioplasty implant and graft: Secondary | ICD-10-CM

## 2021-06-10 DIAGNOSIS — I5032 Chronic diastolic (congestive) heart failure: Secondary | ICD-10-CM

## 2021-06-10 DIAGNOSIS — E785 Hyperlipidemia, unspecified: Secondary | ICD-10-CM | POA: Diagnosis present

## 2021-06-10 DIAGNOSIS — I5042 Chronic combined systolic (congestive) and diastolic (congestive) heart failure: Secondary | ICD-10-CM | POA: Diagnosis present

## 2021-06-10 DIAGNOSIS — R29898 Other symptoms and signs involving the musculoskeletal system: Secondary | ICD-10-CM

## 2021-06-10 HISTORY — DX: Do not resuscitate: Z66

## 2021-06-10 HISTORY — DX: Zoster without complications: B02.9

## 2021-06-10 LAB — COMPREHENSIVE METABOLIC PANEL
ALT: 29 U/L (ref 0–44)
AST: 26 U/L (ref 15–41)
Albumin: 3.8 g/dL (ref 3.5–5.0)
Alkaline Phosphatase: 111 U/L (ref 38–126)
Anion gap: 7 (ref 5–15)
BUN: 14 mg/dL (ref 8–23)
CO2: 23 mmol/L (ref 22–32)
Calcium: 9.6 mg/dL (ref 8.9–10.3)
Chloride: 95 mmol/L — ABNORMAL LOW (ref 98–111)
Creatinine, Ser: 0.97 mg/dL (ref 0.61–1.24)
GFR, Estimated: >60 mL/min (ref >60)
Glucose, Bld: 109 mg/dL — ABNORMAL HIGH (ref 70–99)
Potassium: 4.2 mmol/L (ref 3.5–5.1)
Sodium: 125 mmol/L — ABNORMAL LOW (ref 135–145)
Total Bilirubin: 2.3 mg/dL — ABNORMAL HIGH (ref 0.3–1.2)
Total Protein: 7.1 g/dL (ref 6.5–8.1)

## 2021-06-10 LAB — CBC WITH DIFFERENTIAL/PLATELET
Abs Immature Granulocytes: 0.04 10*3/uL (ref 0.00–0.07)
Basophils Absolute: 0 10*3/uL (ref 0.0–0.1)
Basophils Relative: 0 %
Eosinophils Absolute: 0.1 10*3/uL (ref 0.0–0.5)
Eosinophils Relative: 3 %
HCT: 39.6 % (ref 39.0–52.0)
Hemoglobin: 13.7 g/dL (ref 13.0–17.0)
Immature Granulocytes: 1 %
Lymphocytes Relative: 16 %
Lymphs Abs: 0.9 10*3/uL (ref 0.7–4.0)
MCH: 33.9 pg (ref 26.0–34.0)
MCHC: 34.6 g/dL (ref 30.0–36.0)
MCV: 98 fL (ref 80.0–100.0)
Monocytes Absolute: 0.6 10*3/uL (ref 0.1–1.0)
Monocytes Relative: 10 %
Neutro Abs: 4 10*3/uL (ref 1.7–7.7)
Neutrophils Relative %: 70 %
Platelets: 170 10*3/uL (ref 150–400)
RBC: 4.04 MIL/uL — ABNORMAL LOW (ref 4.22–5.81)
RDW: 12.7 % (ref 11.5–15.5)
WBC: 5.7 10*3/uL (ref 4.0–10.5)
nRBC: 0 % (ref 0.0–0.2)

## 2021-06-10 LAB — URINALYSIS, ROUTINE W REFLEX MICROSCOPIC
Bilirubin Urine: NEGATIVE
Glucose, UA: NEGATIVE mg/dL
Hgb urine dipstick: NEGATIVE
Ketones, ur: NEGATIVE mg/dL
Nitrite: NEGATIVE
Protein, ur: NEGATIVE mg/dL
Specific Gravity, Urine: 1.008 (ref 1.005–1.030)
pH: 7 (ref 5.0–8.0)

## 2021-06-10 LAB — BRAIN NATRIURETIC PEPTIDE: B Natriuretic Peptide: 161.8 pg/mL — ABNORMAL HIGH (ref 0.0–100.0)

## 2021-06-10 LAB — TROPONIN I (HIGH SENSITIVITY)
Troponin I (High Sensitivity): 16 ng/L (ref <18)
Troponin I (High Sensitivity): 14 ng/L (ref <18)

## 2021-06-10 LAB — C-REACTIVE PROTEIN: CRP: 0.6 mg/dL (ref <1.0)

## 2021-06-10 LAB — SEDIMENTATION RATE: Sed Rate: 15 mm/hr (ref 0–16)

## 2021-06-10 MED ORDER — LOSARTAN POTASSIUM 50 MG PO TABS
50.0000 mg | ORAL_TABLET | Freq: Every day | ORAL | Status: DC
  Administered 2021-06-11 – 2021-06-18 (×8): 50 mg via ORAL
  Filled 2021-06-10 (×8): qty 1

## 2021-06-10 MED ORDER — LACTATED RINGERS IV SOLN: INTRAVENOUS | Status: DC

## 2021-06-10 MED ORDER — ATORVASTATIN CALCIUM 40 MG PO TABS
40.0000 mg | ORAL_TABLET | Freq: Every day | ORAL | Status: DC
  Administered 2021-06-10 – 2021-06-24 (×15): 40 mg via ORAL
  Filled 2021-06-10 (×12): qty 1
  Filled 2021-06-10: qty 4
  Filled 2021-06-10 (×2): qty 1

## 2021-06-10 MED ORDER — ONDANSETRON HCL 4 MG PO TABS: 4.0000 mg | ORAL_TABLET | Freq: Four times a day (QID) | ORAL | Status: DC | PRN

## 2021-06-10 MED ORDER — POLYETHYL GLYCOL-PROPYL GLYCOL 0.4-0.3 % OP SOLN: 1.0000 [drp] | Freq: Two times a day (BID) | OPHTHALMIC | Status: DC | PRN

## 2021-06-10 MED ORDER — LISINOPRIL 20 MG PO TABS: 20.0000 mg | ORAL_TABLET | Freq: Every day | ORAL | Status: DC

## 2021-06-10 MED ORDER — APIXABAN 2.5 MG PO TABS
2.5000 mg | ORAL_TABLET | Freq: Two times a day (BID) | ORAL | Status: DC
  Administered 2021-06-10 – 2021-06-11 (×2): 2.5 mg via ORAL
  Filled 2021-06-10 (×2): qty 1

## 2021-06-10 MED ORDER — DOCUSATE SODIUM 100 MG PO CAPS
100.0000 mg | ORAL_CAPSULE | Freq: Two times a day (BID) | ORAL | Status: DC
  Administered 2021-06-10 – 2021-06-25 (×25): 100 mg via ORAL
  Filled 2021-06-10 (×28): qty 1

## 2021-06-10 MED ORDER — HYDROCODONE-ACETAMINOPHEN 5-325 MG PO TABS
1.0000 | ORAL_TABLET | ORAL | Status: DC | PRN
  Filled 2021-06-10: qty 1

## 2021-06-10 MED ORDER — MUPIROCIN CALCIUM 2 % EX CREA
TOPICAL_CREAM | Freq: Two times a day (BID) | CUTANEOUS | Status: DC
  Filled 2021-06-10 (×2): qty 15

## 2021-06-10 MED ORDER — ACETAMINOPHEN 650 MG RE SUPP: 650.0000 mg | Freq: Four times a day (QID) | RECTAL | Status: DC | PRN

## 2021-06-10 MED ORDER — MORPHINE SULFATE (PF) 2 MG/ML IV SOLN: 2.0000 mg | INTRAVENOUS | Status: DC | PRN

## 2021-06-10 MED ORDER — POLYETHYLENE GLYCOL 3350 17 G PO PACK: 17.0000 g | PACK | Freq: Every day | ORAL | Status: DC | PRN

## 2021-06-10 MED ORDER — CARVEDILOL 3.125 MG PO TABS
3.1250 mg | ORAL_TABLET | Freq: Two times a day (BID) | ORAL | Status: DC
  Administered 2021-06-10 – 2021-06-20 (×19): 3.125 mg via ORAL
  Filled 2021-06-10 (×20): qty 1

## 2021-06-10 MED ORDER — BISACODYL 5 MG PO TBEC: 5.0000 mg | DELAYED_RELEASE_TABLET | Freq: Every day | ORAL | Status: DC | PRN

## 2021-06-10 MED ORDER — ACETAMINOPHEN 325 MG PO TABS
650.0000 mg | ORAL_TABLET | Freq: Four times a day (QID) | ORAL | Status: DC | PRN
  Administered 2021-06-19 – 2021-06-21 (×2): 650 mg via ORAL
  Filled 2021-06-10 (×2): qty 2

## 2021-06-10 MED ORDER — ONDANSETRON HCL 4 MG/2ML IJ SOLN: 4.0000 mg | Freq: Four times a day (QID) | INTRAMUSCULAR | Status: DC | PRN

## 2021-06-10 MED ORDER — POLYVINYL ALCOHOL 1.4 % OP SOLN: 1.0000 [drp] | Freq: Two times a day (BID) | OPHTHALMIC | Status: DC | PRN

## 2021-06-10 MED ORDER — TRAMADOL HCL 50 MG PO TABS
50.0000 mg | ORAL_TABLET | Freq: Four times a day (QID) | ORAL | Status: DC | PRN
  Administered 2021-06-22 – 2021-06-25 (×5): 50 mg via ORAL
  Filled 2021-06-10 (×5): qty 1

## 2021-06-10 MED ORDER — HYDRALAZINE HCL 20 MG/ML IJ SOLN: 5.0000 mg | INTRAMUSCULAR | Status: DC | PRN

## 2021-06-10 MED ORDER — SPIRONOLACTONE 12.5 MG HALF TABLET: 12.5000 mg | ORAL_TABLET | Freq: Every day | ORAL | Status: DC

## 2021-06-10 NOTE — ED Triage Notes (Signed)
Pt BIB [facility] from independent living facility [facility] weakness, unable to stand without assistance. Recent hx shingles 9 days ago, has finished antiviral treatment.

## 2021-06-10 NOTE — Telephone Encounter (Signed)
   Pt's daughter is calling back to f/u. She said if someone can call her back as soon as possible regarding pt. Any Dr. Hassell Done nurse

## 2021-06-10 NOTE — ED Notes (Signed)
Called floor to give report, charge nurse stated they need until 11 pm to get report.

## 2021-06-10 NOTE — Telephone Encounter (Signed)
Spoke with the patient's daughter who reports that they called EMS yesterday and they told her that they could not guarantee that her father would be admitted to the hospital. She states that they were told that he may have to sit in the ED for up to 24 hours before being admitted. She states that she is worried about her father sitting in the ER for that long since he has shingles and she does not want him exposed. She would like to know if Dr. Irish Lack could directly admit her to the hospital. I advised her that providers cannot directly admit someone that they have not personally seen. Also advised since the patient was not having any active cardiac issues that Dr. Irish Lack would not be able to assist. Advised to reach out to the patient's PCP and if she feels that her father's condition is that severe that it is best to go ahead and take him to and ED.

## 2021-06-10 NOTE — H&P (Addendum)
History and Physical    Parker Odonnell. JBF:[id number] DOB: 30-Dec-1931 DOA: 06/10/2021  PCP: Daisy Floro, MD Consultants:  Eldridge Dace - cardiology; Odis Luster - plastic surgery; Aluisio - orthopedics Patient coming from: [facility]; NOK: Daughter, Rudean Hitt, [phone]  Chief Complaint: Rash, weakness  HPI: Parker Odonnell. is a 87 y.o. male with medical history significant of CAD; CHF; HLD; afib on Eliquis; and HTN presenting with rash, weakness.  On 8/14, he was seen at [facility] for leg weakness, acute onset. He lives in ILF and he functions without difficulty with a cane.  His R leg was hurting on 8/12, knee and up the leg.  It sounded similar to prior to L hip replacement prior.  2 days later, they called his daughter and his legs went weak and he collapsed to the ground (no injury).  His daughter saw the rash on his leg.  Imaging unremarkable (no blood or urine testing done) and he was discharged, started on Valcyclovir for shingles (uncertain diagnosis).  He took his shingles meds, has had 24/7 care since because he has not been able to care for himself.  She went to see him on 8/21 and the legs are starting to blister/scab over but he still cannot stand.  He has not had therapy.  When he goes to move, his B legs hurt.  He does not have pain when sitting/lying.  He does now have a few spots on his L leg and at the base of his spine.  No recent medication changes.     ED Course: Here recently with rash and pain on leg, given Valcyclovir.  Rash spread more, ?vasculitis.  Not able to walk due to pain, weakness.  Also new hyponatremia.  ESR pending, CRP 0.6.  Picture in chart.  Review of Systems: As per HPI; otherwise review of systems reviewed and negative.   Ambulatory Status:  Ambulates with a cane  COVID Vaccine Status:  Complete plus booster  Past Medical History:  Diagnosis Date   Arthritis    ASCVD (arteriosclerotic cardiovascular disease)    single  vessel   Cancer (HCC)    CHF (congestive heart failure) (HCC)    Coronary atherosclerosis of native coronary artery    DNR (do not resuscitate) 06/10/2021   Dyslipidemia    ED (erectile dysfunction)    GERD (gastroesophageal reflux disease)    hx of   History of kidney stones    Hypertension    Inguinal hernia    Left bundle branch block    chronic   Postsurgical percutaneous transluminal coronary angioplasty status    Shingles     Past Surgical History:  Procedure Laterality Date   CARDIAC CATHETERIZATION  1998   CARDIOVASCULAR STRESS TEST  05/25/12   CHOLECYSTECTOMY  1979   CORONARY STENT PLACEMENT  1997   INGUINAL HERNIA REPAIR  06/15/2012   Procedure: HERNIA REPAIR INGUINAL ADULT;  Surgeon: Ernestene Mention, MD;  Location: [facility] OR;  Service: General;  Laterality: Bilateral;  Bilateral Inguinal Hernia Repair with Ultrapro Mesh   MOHS SURGERY     RIGHT/LEFT HEART CATH AND CORONARY ANGIOGRAPHY N/A 11/13/2019   Procedure: RIGHT/LEFT HEART CATH AND CORONARY ANGIOGRAPHY;  Surgeon: Corky Crafts, MD;  Location: [facility] INVASIVE CV LAB;  Service: Cardiovascular;  Laterality: N/A;   TOTAL HIP ARTHROPLASTY Left 01/10/2020   Procedure: TOTAL HIP ARTHROPLASTY ANTERIOR APPROACH;  Surgeon: Ollen Gross, MD;  Location: [facility] ORS;  Service: Orthopedics;  Laterality: Left;  148min    Social History   Socioeconomic History   Marital status: Widowed    Spouse name: Not on file   Number of children: Not on file   Years of education: Not on file   Highest education level: Not on file  Occupational History   Occupation: retired  Tobacco Use   Smoking status: Never   Smokeless tobacco: Never  Vaping Use   Vaping Use: Never used  Substance and Sexual Activity   Alcohol use: No   Drug use: No   Sexual activity: Not Currently  Other Topics Concern   Not on file  Social History Narrative   Not on file   Social Determinants of Health   Financial Resource Strain: Not on file  Food  Insecurity: Not on file  Transportation Needs: Not on file  Physical Activity: Not on file  Stress: Not on file  Social Connections: Not on file  Intimate Partner Violence: Not on file    No Known Allergies  Family History  Problem Relation Age of Onset   Arthritis Father    Hypertension Paternal Uncle    Heart attack Neg Hx    Stroke Neg Hx     Prior to Admission medications   Medication Sig Start Date End Date Taking? Authorizing Provider  atorvastatin (LIPITOR) 40 MG tablet TAKE ONE TABLET BY MOUTH EVERYDAY AT BEDTIME 01/31/21   Jettie Booze, MD  b complex vitamins tablet Take 1 tablet by mouth daily.    [provider]  carvedilol (COREG) 3.125 MG tablet TAKE ONE TABLET BY MOUTH EVERY MORNING and TAKE ONE TABLET BY MOUTH EVERYDAY AT BEDTIME 01/31/21   Jettie Booze, MD  cephALEXin (KEFLEX) 500 MG capsule Take 1 capsule (500 mg total) by mouth 4 (four) times daily. 08/11/20   Molpus, [place]ham, MD  ELIQUIS 2.5 MG TABS tablet TAKE ONE TABLET BY MOUTH EVERY MORNING and TAKE ONE TABLET BY MOUTH EVERYDAY AT BEDTIME 01/31/21   Jettie Booze, MD  furosemide (LASIX) 20 MG tablet Take 1 tablet (20 mg total) by mouth daily. 06/09/18   Caren Griffins, MD  HYDROcodone-acetaminophen (NORCO) 5-325 MG tablet Take 0.5-1 tablets by mouth every 6 (six) hours as needed (for rib pain). 08/11/20   Molpus, Mutasim, MD  HYDROcodone-acetaminophen (NORCO/VICODIN) 5-325 MG tablet Take 1-2 tablets by mouth every 6 (six) hours as needed for severe pain. 01/12/20   Irving Copas, PA-C  lisinopril (PRINIVIL,ZESTRIL) 20 MG tablet Take 1 tablet (20 mg total) by mouth daily. 06/09/18   Caren Griffins, MD  methocarbamol (ROBAXIN) 500 MG tablet Take 1 tablet (500 mg total) by mouth every 6 (six) hours as needed for muscle spasms. 01/12/20   Irving Copas, PA-C  Multiple Vitamin (MULTIVITAMIN WITH MINERALS) TABS tablet Take 1 tablet by mouth every other day. [facility]  Spectravite Multivitamin     [provider]  Multiple Vitamins-Minerals (OCUVITE-LUTEIN PO) Take 1 capsule by mouth daily.    [provider]  Polyethyl Glycol-Propyl Glycol 0.4-0.3 % SOLN Apply 1 drop to eye 2 (two) times daily as needed (dry eyes).     [provider]  spironolactone (ALDACTONE) 25 MG tablet Take 0.5 tablets (12.5 mg total) by mouth daily. 06/09/18   Caren Griffins, MD  traMADol (ULTRAM) 50 MG tablet Take 1 tablet (50 mg total) by mouth every 6 (six) hours as needed for up to 10 doses. 06/01/21   Luna Fuse, MD  valACYclovir (VALTREX) 1000 MG  tablet Take 1 tablet (1,000 mg total) by mouth 3 (three) times daily. 06/01/21   Luna Fuse, MD    Physical Exam: Vitals:   06/10/21 1228 06/10/21 1548 06/10/21 1800 06/10/21 1845  BP: 132/77 (!) 147/78 (!) 142/80 (!) 162/67  Pulse: 67 71 73 70  Resp: $Remo'20 18 16 20  'LrrgS$ Temp: 98.3 F (36.8 C) (!) 97.4 F (36.3 C)    SpO2: 99% 99% 100% 100%     General:  Appears calm and comfortable and is in NAD, very uncomfortable with light touch or minimal movement Eyes:  PERRL, EOMI, normal lids, iris ENT:  grossly normal hearing, lips & tongue, mmm Neck:  no LAD, masses or thyromegaly Cardiovascular:  RRR, no m/r/g. Tr LE edema.  Respiratory:   CTA bilaterally with no wheezes/rales/rhonchi.  Normal respiratory effort. Abdomen:  soft, NT, ND Back:   isolated scattered purpuric lesions along LS spine Skin:  Purpuric rash as noted below - primarily on RLE with some lesions on lateral L lower leg; not vesicular in nature          Musculoskeletal:  B LE very TTP with apparent neuropathic pain out of proportion to exam; nodule on R great toe as above Lower extremity:  1+ R foot edema.  Limited foot exam with no ulcerations.  2+ distal pulses.Nodule on R great toe as noted. Psychiatric:  blunted mood and affect, speech fluent and appropriate, AOx3 Neurologic:  CN 2-12 grossly intact, moves all extremities in coordinated  fashion    Radiological Exams on Admission: Independently reviewed - see discussion in A/P where applicable  No results found.  EKG: Independently reviewed.  Afib with rate 64; LBBB, NSCSLT   Labs on Admission: I have personally reviewed the available labs and imaging studies at the time of the admission.  Pertinent labs:   Na++ 125 Bili 2.3 BNP 161.8 HS troponin 16 CRP 0.6 ESR 15 Normal CBC Blood cultures pending   Assessment/Plan Principal Problem:   Rash Active Problems:   Dyslipidemia   Essential hypertension   Atrial flutter (HCC)   Weakness   Chronic combined systolic (congestive) and diastolic (congestive) heart failure (HCC)   DNR (do not resuscitate)   LE rash, weakness -Patient with 1+ week h/o purpuric rash -He was seen in the ER on 8/15 and given valacyclovir but rash has not improved and so not appear to be c/w shingles (wrong distribution, appearance not currently consistent) -He does have significant apparent neuropathic LE pain that appears to be out pf proportion from exam -Also with LE weakness -Pictures shared with dermatology friend with patient/daughter permission; rash appears to be vasculitis vs. Calchiphylaxis vs. Infectious vs. embolic  -Vasculitis - negative CRP, ESR so less likely but still possible  -Calciphylaxis - unlikely in non-renal patient but can occur  -Infectious - not shingles.  Does not appear to have superinfection currently.  Blood cultures pending.  Will give topical mupirocin to prevent superinfection.  -Embolic - no recent vascular procedure noted on history and on Eliquis  -Unlikely medication related since patient has not had recent medication changes -Surgery requested to biopsy, needs dermatopathology evaluation -Will observe for now with PT/OT consults -?need for CIR given rapid onset of weakness -Could consider neuro evaluation but there aren't a large number of neurologic conditions that would present with rash so  will hold off for now  Hyponatremia -Possibly related to volume deficiency -Will hydrate and follow with repeat BMP -Hold Lasix, Aldactone  Afib -Rate controlled with  Coreg -Continue Eliquis  HTN -Continue Cozaar, Coreg  HLD -Continue Lipitor  Chronic combined CHF -08/2019 echo with EF 35-40% and grade 2 diastolic dysfunction -Appears to be compensated at this time  DNR -I have discussed code status with the patient and his daughter and  they are in agreement that the patient would not desire resuscitation and would prefer to die a natural death should that situation arise. -He will need a gold out of facility DNR form at the time of discharge      Note: This patient has been tested and is negative for the Odonnell coronavirus COVID-19. The patient has been fully vaccinated against COVID-19.   Level of care: Med-Surg DVT prophylaxis: Eliquis Code Status:  DNR - confirmed with patient/family Family Communication: Daughter was present throughout evaluation. Disposition Plan:  The patient is from: ILF  Anticipated d/c is to: be determined  Anticipated d/c date will depend on clinical response to treatment, but possibly as early as tomorrow if he has excellent response to treatment  Patient is currently: acutely ill Consults called: Surgery; PT/OT/Nutrition  Admission status:  It is my clinical opinion that referral for OBSERVATION is reasonable and necessary in this patient based on the above information provided. The aforementioned taken together are felt to place the patient at high risk for further clinical deterioration. However it is anticipated that the patient may be medically stable for discharge from the hospital within 24 to 48 hours.    Karmen Bongo MD [facility]   How to contact the [place] Day Attending or Consulting provider [place] or covering provider during after hours [place], for this patient?  Check the care team in [facility] and look for a)  attending/consulting [facility] provider listed and b) the [facility] team listed Log into [email or web address] and use [place] universal password to access. If you do not have the password, please contact the hospital operator. Locate the [facility] provider you are looking for under [facility] and page to a number that you can be directly reached. If you still have difficulty reaching the provider, please page the [facility] (Director on Call) for the Hospitalists listed on amion for assistance.   06/10/2021, 7:11 PM

## 2021-06-10 NOTE — ED Notes (Signed)
Soup ordered

## 2021-06-10 NOTE — ED Notes (Signed)
Report given to [place] at [phone]

## 2021-06-10 NOTE — ED Provider Notes (Signed)
[facility] EMERGENCY DEPARTMENT Provider Note   CSN: [id number] Arrival date & time: 06/10/21  1219     History Chief Complaint  Patient presents with   Weakness    Parker Odonnell. is a 87 y.o. male.  He is brought in by ambulance from his independent living facility.  He was seen about a week ago for pain in his right leg.  It was thought that he had shingles at that time and was treated with valacyclovir.  He has had progressive weakness and pain in his lower extremities and inability to transfer since then.  There is pain with ambulation and with palpation of the rash.  He denies any chest pain or shortness of breath.  No nausea or vomiting.  No fevers or chills.  He feels he has been eating and drinking okay.  Usually ambulates with a cane but can independently transfer  The history is provided by the patient.  Weakness Severity:  Moderate Onset quality:  Gradual Duration:  1 week Timing:  Constant Progression:  Worsening Chronicity:  New Relieved by:  Nothing Worsened by:  Activity and standing Ineffective treatments:  Rest Associated symptoms: difficulty walking   Associated symptoms: no abdominal pain, no chest pain, no cough, no diarrhea, no dysuria, no fever, no headaches, no shortness of breath, no syncope and no vomiting   Risk factors: congestive heart failure       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   Cancer (HCC)    CHF (congestive heart failure) (HCC)    Coronary atherosclerosis of native coronary artery    Dyslipidemia    ED (erectile dysfunction)    GERD (gastroesophageal reflux disease)    hx of   History of kidney stones    Hypertension    Inguinal hernia    Left bundle branch block    chronic   Postsurgical percutaneous transluminal coronary angioplasty status    Shingles     Patient Active Problem List   Diagnosis Date Noted   OA  (osteoarthritis) of hip 01/10/2020   Primary osteoarthritis of left hip 01/10/2020   Preoperative cardiovascular examination    Shortness of breath    Bilateral pleural effusion    Thrombocytopenia ([place]) 06/03/2018   Atrial flutter ([place]) 06/03/2018   Acute on chronic systolic CHF (congestive heart failure) ([place]) 06/03/2018   Bilateral edema of lower extremity    Essential hypertension 09/19/2015   LBBB (left bundle branch block) 09/19/2015   Hypokalemia 08/27/2014   Hypertension    ASCVD (arteriosclerotic cardiovascular disease)    Dyslipidemia    ED (erectile dysfunction)    Left bundle branch block    Postsurgical percutaneous transluminal coronary angioplasty status    Coronary atherosclerosis of native coronary artery    Bilateral inguinal hernia 06/15/2012    Past Surgical History:  Procedure Laterality Date   CARDIAC CATHETERIZATION  1998   CARDIOVASCULAR STRESS TEST  05/25/12   CHOLECYSTECTOMY  1979   CORONARY STENT PLACEMENT  1997   INGUINAL HERNIA REPAIR  06/15/2012   Procedure: HERNIA REPAIR INGUINAL ADULT;  Surgeon: Adin Hector, MD;  Location: [place];  Service: General;  Laterality: Bilateral;  Bilateral Inguinal Hernia Repair with Ultrapro Mesh   MOHS SURGERY     RIGHT/LEFT HEART CATH AND CORONARY ANGIOGRAPHY N/A 11/13/2019   Procedure: RIGHT/LEFT HEART CATH AND CORONARY ANGIOGRAPHY;  Surgeon: Jettie Booze, MD;  Location: [facility];  Service: Cardiovascular;  Laterality: N/A;   TOTAL HIP ARTHROPLASTY Left 01/10/2020   Procedure: TOTAL HIP ARTHROPLASTY ANTERIOR APPROACH;  Surgeon: Gaynelle Arabian, MD;  Location: [facility];  Service: Orthopedics;  Laterality: Left;  157mn       Family History  Problem Relation Age of Onset   Arthritis Father    Hypertension Paternal Uncle    Heart attack Neg Hx    Stroke Neg Hx     Social History   Tobacco Use   Smoking status: Never   Smokeless tobacco: Never  Vaping Use   Vaping Use: Never used  Substance  Use Topics   Alcohol use: No   Drug use: No    Home Medications Prior to Admission medications   Medication Sig Start Date End Date Taking? Authorizing Provider  atorvastatin (LIPITOR) 40 MG tablet TAKE ONE TABLET BY MOUTH EVERYDAY AT BEDTIME 01/31/21   VJettie Booze MD  b complex vitamins tablet Take 1 tablet by mouth daily.    [provider]  carvedilol (COREG) 3.125 MG tablet TAKE ONE TABLET BY MOUTH EVERY MORNING and TAKE ONE TABLET BY MOUTH EVERYDAY AT BEDTIME 01/31/21   VJettie Booze MD  cephALEXin (KEFLEX) 500 MG capsule Take 1 capsule (500 mg total) by mouth 4 (four) times daily. 08/11/20   Molpus, Caydn, MD  ELIQUIS 2.5 MG TABS tablet TAKE ONE TABLET BY MOUTH EVERY MORNING and TAKE ONE TABLET BY MOUTH EVERYDAY AT BEDTIME 01/31/21   VJettie Booze MD  furosemide (LASIX) 20 MG tablet Take 1 tablet (20 mg total) by mouth daily. 06/09/18   GCaren Griffins MD  HYDROcodone-acetaminophen (NORCO) 5-325 MG tablet Take 0.5-1 tablets by mouth every 6 (six) hours as needed (for rib pain). 08/11/20   Molpus, Cleo, MD  HYDROcodone-acetaminophen (NORCO/VICODIN) 5-325 MG tablet Take 1-2 tablets by mouth every 6 (six) hours as needed for severe pain. 01/12/20   DIrving Copas PA-C  lisinopril (PRINIVIL,ZESTRIL) 20 MG tablet Take 1 tablet (20 mg total) by mouth daily. 06/09/18   GCaren Griffins MD  methocarbamol (ROBAXIN) 500 MG tablet Take 1 tablet (500 mg total) by mouth every 6 (six) hours as needed for muscle spasms. 01/12/20   DIrving Copas PA-C  Multiple Vitamin (MULTIVITAMIN WITH MINERALS) TABS tablet Take 1 tablet by mouth every other day. [facility]  Spectravite Multivitamin    [provider]  Multiple Vitamins-Minerals (OCUVITE-LUTEIN PO) Take 1 capsule by mouth daily.    [provider]  Polyethyl Glycol-Propyl Glycol 0.4-0.3 % SOLN Apply 1 drop to eye 2 (two) times daily as needed (dry eyes).     [provider]  spironolactone  (ALDACTONE) 25 MG tablet Take 0.5 tablets (12.5 mg total) by mouth daily. 06/09/18   GCaren Griffins MD  traMADol (ULTRAM) 50 MG tablet Take 1 tablet (50 mg total) by mouth every 6 (six) hours as needed for up to 10 doses. 06/01/21   HLuna Fuse MD  valACYclovir (VALTREX) 1000 MG tablet Take 1 tablet (1,000 mg total) by mouth 3 (three) times daily. 06/01/21   HLuna Fuse MD    Allergies    Patient has no known allergies.  Review of Systems   Review of Systems  Constitutional:  Negative for fever.  HENT:  Negative for sore throat.   Eyes:  Positive for visual disturbance (macular degeneration).  Respiratory:  Negative for cough and shortness of breath.   Cardiovascular:  Negative  for chest pain and syncope.  Gastrointestinal:  Negative for abdominal pain, diarrhea and vomiting.  Genitourinary:  Negative for dysuria.  Musculoskeletal:  Positive for gait problem.  Skin:  Positive for rash.  Neurological:  Positive for weakness. Negative for headaches.   Physical Exam Updated Vital Signs BP 132/77 (BP Location: Right Arm)   Pulse 67   Temp 98.3 F (36.8 C)   Resp 20   SpO2 99%   Physical Exam Vitals and nursing note reviewed.  Constitutional:      Appearance: He is well-developed.  HENT:     Head: Normocephalic and atraumatic.  Eyes:     Conjunctiva/sclera: Conjunctivae normal.  Cardiovascular:     Rate and Rhythm: Normal rate and regular rhythm.     Heart sounds: No murmur heard. Pulmonary:     Effort: Pulmonary effort is normal. No respiratory distress.     Breath sounds: Normal breath sounds.  Abdominal:     Palpations: Abdomen is soft.     Tenderness: There is no abdominal tenderness.  Musculoskeletal:        General: Tenderness present. Normal range of motion.     Cervical back: Neck supple.  Skin:    General: Skin is warm and dry.     Findings: Rash present.     Comments: There are painful palpable purpura on the patient's lower extremities.   Neurological:     General: No focal deficit present.     Mental Status: He is alert.     ED Results / Procedures / Treatments   Labs (all labs ordered are listed, but only abnormal results are displayed) Labs Reviewed  CBC WITH DIFFERENTIAL/PLATELET - Abnormal; Notable for the following components:      Result Value   RBC 4.04 (*)    All other components within normal limits  COMPREHENSIVE METABOLIC PANEL - Abnormal; Notable for the following components:   Sodium 125 (*)    Chloride 95 (*)    Glucose, Bld 109 (*)    Total Bilirubin 2.3 (*)    All other components within normal limits  BRAIN NATRIURETIC PEPTIDE - Abnormal; Notable for the following components:   B Natriuretic Peptide 161.8 (*)    All other components within normal limits  CULTURE, BLOOD (ROUTINE X 2)  CULTURE, BLOOD (ROUTINE X 2)  SEDIMENTATION RATE  C-REACTIVE PROTEIN  URINALYSIS, ROUTINE W REFLEX MICROSCOPIC  BASIC METABOLIC PANEL  CBC  TROPONIN I (HIGH SENSITIVITY)  TROPONIN I (HIGH SENSITIVITY)    EKG EKG Interpretation  Date/Time:  Tuesday June 10 2021 13:17:47 EDT Ventricular Rate:  64 PR Interval:    QRS Duration: 150 QT Interval:  468 QTC Calculation: 482 R Axis:   230 Text Interpretation: Atrial fibrillation Right superior axis deviation Left bundle branch block Abnormal ECG No significant change since prior 8/19 Confirmed by Aletta Edouard [id number]) on 06/10/2021 1:19:30 PM  Radiology No results found.  Procedures Procedures   Medications Ordered in ED Medications  traMADol (ULTRAM) tablet 50 mg (has no administration in time range)  atorvastatin (LIPITOR) tablet 40 mg (has no administration in time range)  carvedilol (COREG) tablet 3.125 mg (3.125 mg Oral Given 06/10/21 1847)  spironolactone (ALDACTONE) tablet 12.5 mg (has no administration in time range)  apixaban (ELIQUIS) tablet 2.5 mg (has no administration in time range)  lactated ringers infusion ( Intravenous New Bag/Given  06/10/21 1847)  acetaminophen (TYLENOL) tablet 650 mg (has no administration in time range)    Or  acetaminophen (TYLENOL)  suppository 650 mg (has no administration in time range)  HYDROcodone-acetaminophen (NORCO/VICODIN) 5-325 MG per tablet 1-2 tablet (has no administration in time range)  morphine 2 MG/ML injection 2 mg (has no administration in time range)  docusate sodium (COLACE) capsule 100 mg (has no administration in time range)  polyethylene glycol (MIRALAX / GLYCOLAX) packet 17 g (has no administration in time range)  bisacodyl (DULCOLAX) EC tablet 5 mg (has no administration in time range)  ondansetron (ZOFRAN) tablet 4 mg (has no administration in time range)    Or  ondansetron (ZOFRAN) injection 4 mg (has no administration in time range)  hydrALAZINE (APRESOLINE) injection 5 mg (has no administration in time range)  polyvinyl alcohol (LIQUIFILM TEARS) 1.4 % ophthalmic solution 1 drop (has no administration in time range)  losartan (COZAAR) tablet 50 mg (has no administration in time range)    ED Course  I have reviewed the triage vital signs and the nursing notes.  Pertinent labs & imaging results that were available during my care of the patient were reviewed by me and considered in my medical decision making (see chart for details).  Clinical Course as of 06/10/21 1853  Tue Jun 10, 2021  1500 Patient's labs showing a low sodium of 125.  He appears euvolemic.  BMP but has been higher in the past.  Normal white count normal CRP.  Discussed with [facility] hospitalist Dr. Lorin Mercy who will evaluate the patient for possible admission. [MB]    Clinical Course User Index [MB] Hayden Rasmussen, MD   MDM Rules/Calculators/A&P                          This patient complains of rash on legs right greater than left, bilateral leg pain with movement, generalized weakness and inability to transfer; this involves an extensive number of treatment Options and is a complaint that carries with  it a high risk of complications and Morbidity. The differential includes shingles, vasculitis, metabolic derangement, infection  I ordered, reviewed and interpreted labs, which included CBC with normal white count normal hemoglobin, chemistries with low sodium low chloride normal LFTs except for isolated T bili, sed rate and CRP not elevated, troponins flat, BNP mildly elevated, blood culture sent I reviewed the imaging done last week was negative for head knee and right hip Additional history obtained from patient's daughter Previous records obtained and reviewed in epic including prior ED visit I consulted Dr. Lorin Mercy [facility] hospitalist and discussed lab and imaging findings  Critical Interventions: None  After the interventions stated above, I reevaluated the patient and found patient still to be symptomatic and unable to return to independent living.  He will need admission to the hospital for further work-up and management of his symptoms.  Patient in agreement with plan.   Final Clinical Impression(s) / ED Diagnoses Final diagnoses:  Weakness  Vasculitis ([place])  Hyponatremia    Rx / DC Orders ED Discharge Orders     None        Hayden Rasmussen, MD 06/10/21 [phone]

## 2021-06-11 ENCOUNTER — Observation Stay (HOSPITAL_COMMUNITY): Payer: Medicare Other

## 2021-06-11 DIAGNOSIS — G8220 Paraplegia, unspecified: Secondary | ICD-10-CM

## 2021-06-11 DIAGNOSIS — Z7901 Long term (current) use of anticoagulants: Secondary | ICD-10-CM | POA: Diagnosis not present

## 2021-06-11 DIAGNOSIS — H3581 Retinal edema: Secondary | ICD-10-CM | POA: Diagnosis present

## 2021-06-11 DIAGNOSIS — M7989 Other specified soft tissue disorders: Secondary | ICD-10-CM | POA: Diagnosis not present

## 2021-06-11 DIAGNOSIS — R21 Rash and other nonspecific skin eruption: Secondary | ICD-10-CM | POA: Diagnosis present

## 2021-06-11 DIAGNOSIS — I4892 Unspecified atrial flutter: Secondary | ICD-10-CM | POA: Diagnosis present

## 2021-06-11 DIAGNOSIS — I5042 Chronic combined systolic (congestive) and diastolic (congestive) heart failure: Secondary | ICD-10-CM | POA: Diagnosis present

## 2021-06-11 DIAGNOSIS — H548 Legal blindness, as defined in USA: Secondary | ICD-10-CM | POA: Diagnosis present

## 2021-06-11 DIAGNOSIS — H814 Vertigo of central origin: Secondary | ICD-10-CM | POA: Diagnosis not present

## 2021-06-11 DIAGNOSIS — R4189 Other symptoms and signs involving cognitive functions and awareness: Secondary | ICD-10-CM | POA: Diagnosis not present

## 2021-06-11 DIAGNOSIS — I4819 Other persistent atrial fibrillation: Secondary | ICD-10-CM | POA: Diagnosis present

## 2021-06-11 DIAGNOSIS — B0111 Varicella encephalitis and encephalomyelitis: Secondary | ICD-10-CM | POA: Diagnosis not present

## 2021-06-11 DIAGNOSIS — G8310 Monoplegia of lower limb affecting unspecified side: Secondary | ICD-10-CM | POA: Diagnosis not present

## 2021-06-11 DIAGNOSIS — R911 Solitary pulmonary nodule: Secondary | ICD-10-CM | POA: Diagnosis present

## 2021-06-11 DIAGNOSIS — Z66 Do not resuscitate: Secondary | ICD-10-CM | POA: Diagnosis present

## 2021-06-11 DIAGNOSIS — N1339 Other hydronephrosis: Secondary | ICD-10-CM | POA: Diagnosis not present

## 2021-06-11 DIAGNOSIS — A879 Viral meningitis, unspecified: Secondary | ICD-10-CM | POA: Diagnosis not present

## 2021-06-11 DIAGNOSIS — I776 Arteritis, unspecified: Secondary | ICD-10-CM | POA: Diagnosis not present

## 2021-06-11 DIAGNOSIS — I5032 Chronic diastolic (congestive) heart failure: Secondary | ICD-10-CM | POA: Diagnosis not present

## 2021-06-11 DIAGNOSIS — M792 Neuralgia and neuritis, unspecified: Secondary | ICD-10-CM | POA: Diagnosis not present

## 2021-06-11 DIAGNOSIS — R5381 Other malaise: Secondary | ICD-10-CM | POA: Diagnosis not present

## 2021-06-11 DIAGNOSIS — I428 Other cardiomyopathies: Secondary | ICD-10-CM | POA: Diagnosis present

## 2021-06-11 DIAGNOSIS — M8448XA Pathological fracture, other site, initial encounter for fracture: Secondary | ICD-10-CM | POA: Diagnosis present

## 2021-06-11 DIAGNOSIS — E875 Hyperkalemia: Secondary | ICD-10-CM | POA: Diagnosis not present

## 2021-06-11 DIAGNOSIS — B019 Varicella without complication: Secondary | ICD-10-CM | POA: Diagnosis not present

## 2021-06-11 DIAGNOSIS — R29898 Other symptoms and signs involving the musculoskeletal system: Secondary | ICD-10-CM | POA: Diagnosis not present

## 2021-06-11 DIAGNOSIS — E871 Hypo-osmolality and hyponatremia: Secondary | ICD-10-CM | POA: Diagnosis not present

## 2021-06-11 DIAGNOSIS — Z20822 Contact with and (suspected) exposure to covid-19: Secondary | ICD-10-CM | POA: Diagnosis present

## 2021-06-11 DIAGNOSIS — R531 Weakness: Secondary | ICD-10-CM

## 2021-06-11 DIAGNOSIS — B021 Zoster meningitis: Secondary | ICD-10-CM | POA: Diagnosis present

## 2021-06-11 DIAGNOSIS — R609 Edema, unspecified: Secondary | ICD-10-CM | POA: Diagnosis not present

## 2021-06-11 DIAGNOSIS — M4856XA Collapsed vertebra, not elsewhere classified, lumbar region, initial encounter for fracture: Secondary | ICD-10-CM | POA: Diagnosis present

## 2021-06-11 DIAGNOSIS — I2510 Atherosclerotic heart disease of native coronary artery without angina pectoris: Secondary | ICD-10-CM | POA: Diagnosis present

## 2021-06-11 DIAGNOSIS — Z96642 Presence of left artificial hip joint: Secondary | ICD-10-CM | POA: Diagnosis present

## 2021-06-11 DIAGNOSIS — N179 Acute kidney failure, unspecified: Secondary | ICD-10-CM | POA: Diagnosis not present

## 2021-06-11 DIAGNOSIS — E785 Hyperlipidemia, unspecified: Secondary | ICD-10-CM | POA: Diagnosis present

## 2021-06-11 DIAGNOSIS — N401 Enlarged prostate with lower urinary tract symptoms: Secondary | ICD-10-CM | POA: Diagnosis not present

## 2021-06-11 DIAGNOSIS — E222 Syndrome of inappropriate secretion of antidiuretic hormone: Secondary | ICD-10-CM | POA: Diagnosis present

## 2021-06-11 DIAGNOSIS — K5901 Slow transit constipation: Secondary | ICD-10-CM | POA: Diagnosis not present

## 2021-06-11 DIAGNOSIS — E860 Dehydration: Secondary | ICD-10-CM | POA: Diagnosis present

## 2021-06-11 DIAGNOSIS — I447 Left bundle-branch block, unspecified: Secondary | ICD-10-CM | POA: Diagnosis present

## 2021-06-11 DIAGNOSIS — B029 Zoster without complications: Secondary | ICD-10-CM | POA: Diagnosis not present

## 2021-06-11 DIAGNOSIS — I110 Hypertensive heart disease with heart failure: Secondary | ICD-10-CM | POA: Diagnosis present

## 2021-06-11 DIAGNOSIS — I10 Essential (primary) hypertension: Secondary | ICD-10-CM | POA: Diagnosis not present

## 2021-06-11 DIAGNOSIS — E440 Moderate protein-calorie malnutrition: Secondary | ICD-10-CM | POA: Diagnosis not present

## 2021-06-11 DIAGNOSIS — R42 Dizziness and giddiness: Secondary | ICD-10-CM | POA: Diagnosis not present

## 2021-06-11 LAB — BASIC METABOLIC PANEL WITH GFR
Anion gap: 7 (ref 5–15)
BUN: 13 mg/dL (ref 8–23)
CO2: 21 mmol/L — ABNORMAL LOW (ref 22–32)
Calcium: 9.2 mg/dL (ref 8.9–10.3)
Chloride: 97 mmol/L — ABNORMAL LOW (ref 98–111)
Creatinine, Ser: 0.86 mg/dL (ref 0.61–1.24)
GFR, Estimated: >60 mL/min (ref >60)
Glucose, Bld: 116 mg/dL — ABNORMAL HIGH (ref 70–99)
Potassium: 3.8 mmol/L (ref 3.5–5.1)
Sodium: 125 mmol/L — ABNORMAL LOW (ref 135–145)

## 2021-06-11 LAB — HEPATITIS PANEL, ACUTE
HCV Ab: NONREACTIVE
Hep A IgM: NONREACTIVE
Hep B C IgM: NONREACTIVE
Hepatitis B Surface Ag: NONREACTIVE

## 2021-06-11 LAB — CBC
HCT: 36.9 % — ABNORMAL LOW (ref 39.0–52.0)
Hemoglobin: 13 g/dL (ref 13.0–17.0)
MCH: 34.1 pg — ABNORMAL HIGH (ref 26.0–34.0)
MCHC: 35.2 g/dL (ref 30.0–36.0)
MCV: 96.9 fL (ref 80.0–100.0)
Platelets: 168 K/uL (ref 150–400)
RBC: 3.81 MIL/uL — ABNORMAL LOW (ref 4.22–5.81)
RDW: 12.9 % (ref 11.5–15.5)
WBC: 5.9 K/uL (ref 4.0–10.5)
nRBC: 0 % (ref 0.0–0.2)

## 2021-06-11 LAB — HIV ANTIBODY (ROUTINE TESTING W REFLEX): HIV Screen 4th Generation wRfx: NONREACTIVE

## 2021-06-11 LAB — SARS CORONAVIRUS 2 (TAT 6-24 HRS): SARS Coronavirus 2: NEGATIVE

## 2021-06-11 LAB — OSMOLALITY: Osmolality: 271 mosm/kg — ABNORMAL LOW (ref 275–295)

## 2021-06-11 LAB — URIC ACID: Uric Acid, Serum: 4.1 mg/dL (ref 3.7–8.6)

## 2021-06-11 LAB — CK: Total CK: 62 U/L (ref 49–397)

## 2021-06-11 LAB — SODIUM, URINE, RANDOM: Sodium, Ur: 44 mmol/L

## 2021-06-11 LAB — TSH: TSH: 1.875 u[IU]/mL (ref 0.350–4.500)

## 2021-06-11 MED ORDER — ENSURE ENLIVE PO LIQD
237.0000 mL | Freq: Two times a day (BID) | ORAL | Status: DC
  Administered 2021-06-11 – 2021-06-23 (×25): 237 mL via ORAL
  Filled 2021-06-11 (×2): qty 237

## 2021-06-11 MED ORDER — ADULT MULTIVITAMIN W/MINERALS CH
1.0000 | ORAL_TABLET | Freq: Every day | ORAL | Status: DC
  Administered 2021-06-11 – 2021-06-25 (×15): 1 via ORAL
  Filled 2021-06-11 (×15): qty 1

## 2021-06-11 MED ORDER — PREGABALIN 25 MG PO CAPS
50.0000 mg | ORAL_CAPSULE | Freq: Two times a day (BID) | ORAL | Status: DC
  Administered 2021-06-11 – 2021-06-25 (×29): 50 mg via ORAL
  Filled 2021-06-11 (×29): qty 2

## 2021-06-11 MED ORDER — SODIUM CHLORIDE 0.9 % IV SOLN: INTRAVENOUS | Status: AC

## 2021-06-11 MED ORDER — LIDOCAINE HCL 2 % IJ SOLN
10.0000 mL | INTRAMUSCULAR | Status: AC
  Administered 2021-06-11: 200 mg via INTRADERMAL
  Filled 2021-06-11: qty 10

## 2021-06-11 NOTE — Consult Note (Addendum)
8 neurology Consultation Reason for Consult: leg weakness Referring Physician: Dr Oren Binet  CC: rash, lower extremity weakness  History is obtained from: patient, chart review  HPI: Parker Odonnell. is a 87 y.o. 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 fib on Eliquis, left bundle branch block who is living in assisted living facility and presented with worsening lower extremity weakness.  Patient states about 2 to 3 weeks ago he started noticing pain in his right knee followed by right lower extremity weakness.  He was seen at [facility] long ED on 06/01/2021 because his leg gave out and he slid to the ground, no head injury.  During the evaluation in the ED he was also noted to have a rash on his right lower extremity.  CT head without contrast did not show any acute abnormality.  X-ray of hip showed degenerative change of the right hip, left hip replacement.  No acute bony abnormalities were noted.  He was diagnosed with possible.  He was diagnosed with possible shingles and started on valacyclovir.  However, patient states his weakness has continued to worsen and now has bilateral right more than left lower extremity weakness.  The rash on the right lower extremity is still present.  Patient denies any headache, vision changes, paresthesias, speech disturbance, new medications, neck pain, back pain, injury to leg.   Patient also has a lesion/dried scab on left side of nose.  Patient states it has been there for over 3 to 4 months.  States he saw his dermatologist for that who told him it was nothing to worry about.  Surgery was consulted to request biopsy of the rash.  Neurology was consulted for further evaluation of weakness.  ROS: All other systems reviewed and negative except as noted in the HPI.    Past Medical History:  Diagnosis Date    Arthritis    ASCVD (arteriosclerotic cardiovascular disease)    single vessel   Cancer (HCC)    CHF (congestive heart failure) (HCC)    Coronary atherosclerosis of native coronary artery    DNR (do not resuscitate) 06/10/2021   Dyslipidemia    ED (erectile dysfunction)    GERD (gastroesophageal reflux disease)    hx of   History of kidney stones    Hypertension    Inguinal hernia    Left bundle branch block    chronic   Postsurgical percutaneous transluminal coronary angioplasty status    Shingles    Family History  Problem Relation Age of Onset   Arthritis Father    Hypertension Paternal Uncle    Heart attack Neg Hx    Stroke Neg Hx     Social History:  reports that he has never smoked. He has never used smokeless tobacco. He reports that he does not drink alcohol and does not use drugs.  Medications Prior to Admission  Medication Sig Dispense Refill Last Dose   atorvastatin (LIPITOR) 40 MG tablet TAKE ONE TABLET BY MOUTH EVERYDAY AT BEDTIME 90 tablet 3 06/09/2021   b complex vitamins tablet Take 1 tablet by mouth daily.   06/10/2021   carvedilol (COREG) 3.125 MG tablet TAKE ONE TABLET BY MOUTH EVERY MORNING and TAKE ONE TABLET BY MOUTH EVERYDAY AT BEDTIME 180 tablet 3 06/10/2021 at 0900   ELIQUIS 2.5 MG TABS tablet TAKE ONE TABLET BY MOUTH EVERY MORNING and TAKE ONE TABLET  BY MOUTH EVERYDAY AT BEDTIME 60 tablet 5 06/10/2021 at 0900   furosemide (LASIX) 20 MG tablet Take 1 tablet (20 mg total) by mouth daily. 30 tablet 0 06/10/2021   losartan (COZAAR) 50 MG tablet Take 50 mg by mouth daily.   06/10/2021   Multiple Vitamin (MULTIVITAMIN WITH MINERALS) TABS tablet Take 1 tablet by mouth every other day. [facility]  Spectravite Multivitamin   06/10/2021   Multiple Vitamins-Minerals (OCUVITE-LUTEIN PO) Take 1 capsule by mouth daily.   06/10/2021   Polyethyl Glycol-Propyl Glycol 0.4-0.3 % SOLN Apply 1 drop to eye 2 (two) times daily as needed (dry eyes).    Past Week   spironolactone (ALDACTONE)  25 MG tablet Take 0.5 tablets (12.5 mg total) by mouth daily. 30 tablet 0 06/10/2021   lisinopril (PRINIVIL,ZESTRIL) 20 MG tablet Take 1 tablet (20 mg total) by mouth daily. (Patient not taking: No sig reported) 30 tablet 0 Not Taking   methocarbamol (ROBAXIN) 500 MG tablet Take 1 tablet (500 mg total) by mouth every 6 (six) hours as needed for muscle spasms. (Patient not taking: No sig reported) 40 tablet 0 Completed Course   traMADol (ULTRAM) 50 MG tablet Take 1 tablet (50 mg total) by mouth every 6 (six) hours as needed for up to 10 doses. (Patient not taking: No sig reported) 10 tablet 0 Not Taking      Exam: Current vital signs: BP 128/68 (BP Location: Left Arm)   Pulse 71   Temp 98.7 F (37.1 C) (Oral)   Resp 17   SpO2 98%  Vital signs in last 24 hours: Temp:  [97.4 F (36.3 C)-98.7 F (37.1 C)] 98.7 F (37.1 C) (08/24 1237) Pulse Rate:  [64-73] 71 (08/24 1237) Resp:  [16-20] 17 (08/24 1237) BP: (128-162)/(67-85) 128/68 (08/24 1237) SpO2:  [96 %-100 %] 98 % (08/24 1237)   Physical Exam  Constitutional: Appears well-developed and well-nourished.  Psych: Affect appropriate to situation Eyes: No scleral injection HENT: No OP obstrucion Head: Normocephalic.  Cardiovascular: Normal rate and regular rhythm.  Respiratory: Effort normal, non-labored breathing GI: Soft.  No distension. There is no tenderness.  Skin: Right lower extremity purpuric rash, dried scab on left side of nose Neuro: AOx3, cranial nerves are all grossly intact, 5/5 in bilateral upper extremities, 4 -/5 in left lower extremity, 3/5 in right lower extremity, 2+ biceps, 1+ knee jerk bilaterally, allodynia in bilateral feet and right shin, no myalgias.  I have reviewed labs in epic and the results pertinent to this consultation are: CBC:  Recent Labs  Lab 06/10/21 1310 06/11/21 0143  WBC 5.7 5.9  NEUTROABS 4.0  --   HGB 13.7 13.0  HCT 39.6 36.9*  MCV 98.0 96.9  PLT 170 [id number]    Basic Metabolic Panel:   Lab Results  Component Value Date   NA 125 (L) 06/11/2021   K 3.8 06/11/2021   CO2 21 (L) 06/11/2021   GLUCOSE 116 (H) 06/11/2021   BUN 13 06/11/2021   CREATININE 0.86 06/11/2021   CALCIUM 9.2 06/11/2021   GFRNONAA >60 06/11/2021   GFRAA >60 01/12/2020   Lipid Panel:  Lab Results  Component Value Date   LDLCALC 45 12/23/2016   HgbA1c: No results found for: HGBA1C Urine Drug Screen: No results found for: LABOPIA, COCAINSCRNUR, LABBENZ, AMPHETMU, THCU, LABBARB  Alcohol Level No results found for: ETH  I have reviewed the images obtained:  CT head without contrast 06/02/2019: No acute abnormality.  ASSESSMENT/PLAN: 87 year old male with history of squamous cell  carcinoma of back status-post resection, atrial fibrillation on Eliquis who has had worsening right more than left lower extremity weakness over 3 weeks as well as right lower extremity rash.  Bilateral lower extremity paresis Right lower extremity rash Hyponatremia -Differentials include infectious versus vasculitic neuropathy versus paraneoplastic disease versus due to medication (Eliquis) versus postherpetic neuralgia (although low suspicion as the distribution does not appear typical for shingles)  Recommendations: -We will order blood work for vasculitis including HIV, hepatitis panel, Lyme disease, ANCA profile, anti-DNA, ANA, anti-smooth antibody, anti-smooth muscle antibody, C1 esterase inhibitor, C3 complement, C4 complement, cardiolipin antibodies, complement complement C1q, cryoglobulins, lupus anticoagulant, rheumatoid factor, Sjogren's syndrome A, Sjogren's B, urine and serum electrophoresis -Would also like to perform lumbar puncture but need to hold Eliquis for 24 hours.  Attempted to call patient's daughter but went to voicemail.  For now we will hold off Eliquis overnight and again attempt to call patient daughter tomorrow prior to lumbar puncture . -I did notify patient's nurse as well as Dr. Sloan Leiter that  patient will be at an increased risk of thrombus/emboli because of holding his Eliquis and to notify the team if he has any acute neurological deficit, trouble breathing, abdominal pain, etc. -We will hold off on spine and brain imaging as patient does not have any evidence of stroke or myelopathy on exam -Can consider pregabalin for allodynia -If work-up negative and patient continues to worsen, can consider IV steroids -Management of rest of comorbidities per primary team  Thank you for allowing [place] to participate in the care of this patient. If you have any further questions, please contact  me or neurohospitalist.   I have spent a total of   125 minutes with the patient reviewing hospital notes,  test results, labs and examining the patient as well as establishing an assessment and plan that was discussed personally with the patient.  > 50% of time was spent in direct patient care.   Zeb Comfort Epilepsy [facility] neurohospitalist

## 2021-06-11 NOTE — Consult Note (Signed)
[facility] Surgery Consult  Note  Parker Odonnell. 1932-09-07  [id number].    Requesting MD: Oren Binet Chief Complaint: rash and lower extremity weakness Reason for Consult: skin biopsy  HPI:  Presents regarding 87 year old male who comes from an assisted living facility with multiple medical problems, including CAD, CHF, atrial fibrillation on Eliquis, hypertension and hyperlipidemia.  He was seen on 06/01/2021 at [facility] for leg weakness of acute onset.  His right leg was hurting.  He was started on valacyclovir for shingles.  He is previously fairly independent but requires 24/7 care he started developing blisters/scabs on his right lower extremity he could not stand and was brought to the emergency department.  Work-up by Dr. Karmen Bongo shows he is afebrile vital signs are stable.  Significant hyponatremia with a sodium of 125, BNP 161, CRP of 0.7, the remainder of the CMP and CBC are normal. Urinalysis shows 11-20 WBC/hpf.  Chest x-ray shows no active disease.  CT of the head on 06/01/2021 showed chronic atrophic and ischemic changes without acute abnormality.  Knee films 8/14, show degenerative changes with no acute abnormalities.  Hip films 8/14, show degenerative change of the right hip joint with no acute bony abnormalities.  With a history of lower extremity rash and weakness we have been asked to obtain skin biopsies.   ROS: Review of Systems  Constitutional: Negative.   HENT: Negative.    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    Cardiovascular: Negative.   Gastrointestinal: Negative.   Genitourinary: Negative.   Musculoskeletal:        Complains of the onset of weakness right and left sides, not sure how long he gives different versions of this story  Skin:  Positive for rash.       Senile keratosis , he has something on his nose also.  He reports the dermatologist said to  leave it be.  Neurological:  Positive for focal weakness and weakness.  Endo/Heme/Allergies:  Bruises/bleeds easily.  Psychiatric/Behavioral:  Positive for memory loss.    Family History  Problem Relation Age of Onset   Arthritis Father    Hypertension Paternal Uncle    Heart attack Neg Hx    Stroke Neg Hx     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   Cancer (HCC)    CHF (congestive heart failure) ([place])    Coronary atherosclerosis of native coronary artery    DNR (do not resuscitate) 06/10/2021   Dyslipidemia    ED (erectile dysfunction)    GERD (gastroesophageal reflux disease)    hx of   History of kidney stones    Hypertension    Inguinal hernia    Left bundle branch block    chronic   Postsurgical percutaneous transluminal coronary angioplasty status    Shingles     Past Surgical History:  Procedure Laterality Date   [place] TEST  05/25/12   [place]   INGUINAL HERNIA REPAIR  06/15/2012   Procedure: HERNIA REPAIR INGUINAL ADULT;  Surgeon: Adin Hector, MD;  Location: [facility];  Service: General;  Laterality: Bilateral;  Bilateral Inguinal Hernia Repair with Ultrapro Mesh   MOHS SURGERY     RIGHT/LEFT HEART CATH AND CORONARY  ANGIOGRAPHY N/A 11/13/2019   Procedure: RIGHT/LEFT HEART CATH AND CORONARY ANGIOGRAPHY;  Surgeon: Jettie Booze, MD;  Location: [facility];  Service: Cardiovascular;  Laterality: N/A;   TOTAL HIP ARTHROPLASTY Left 01/10/2020   Procedure: TOTAL HIP ARTHROPLASTY ANTERIOR APPROACH;  Surgeon: Gaynelle Arabian, MD;  Location: [facility];  Service: Orthopedics;  Laterality: Left;  161mn    Social History:  reports that he has never smoked. He has never used smokeless tobacco. He reports that he does not drink alcohol and does not use drugs.  Allergies: No Known  Allergies  Medications Prior to Admission  Medication Sig Dispense Refill   atorvastatin (LIPITOR) 40 MG tablet TAKE ONE TABLET BY MOUTH EVERYDAY AT BEDTIME 90 tablet 3   b complex vitamins tablet Take 1 tablet by mouth daily.     carvedilol (COREG) 3.125 MG tablet TAKE ONE TABLET BY MOUTH EVERY MORNING and TAKE ONE TABLET BY MOUTH EVERYDAY AT BEDTIME 180 tablet 3   ELIQUIS 2.5 MG TABS tablet TAKE ONE TABLET BY MOUTH EVERY MORNING and TAKE ONE TABLET BY MOUTH EVERYDAY AT BEDTIME 60 tablet 5   furosemide (LASIX) 20 MG tablet Take 1 tablet (20 mg total) by mouth daily. 30 tablet 0   losartan (COZAAR) 50 MG tablet Take 50 mg by mouth daily.     Multiple Vitamin (MULTIVITAMIN WITH MINERALS) TABS tablet Take 1 tablet by mouth every other day. [facility]  Spectravite Multivitamin     Multiple Vitamins-Minerals (OCUVITE-LUTEIN PO) Take 1 capsule by mouth daily.     Polyethyl Glycol-Propyl Glycol 0.4-0.3 % SOLN Apply 1 drop to eye 2 (two) times daily as needed (dry eyes).      spironolactone (ALDACTONE) 25 MG tablet Take 0.5 tablets (12.5 mg total) by mouth daily. 30 tablet 0   lisinopril (PRINIVIL,ZESTRIL) 20 MG tablet Take 1 tablet (20 mg total) by mouth daily. (Patient not taking: No sig reported) 30 tablet 0   methocarbamol (ROBAXIN) 500 MG tablet Take 1 tablet (500 mg total) by mouth every 6 (six) hours as needed for muscle spasms. (Patient not taking: No sig reported) 40 tablet 0   traMADol (ULTRAM) 50 MG tablet Take 1 tablet (50 mg total) by mouth every 6 (six) hours as needed for up to 10 doses. (Patient not taking: No sig reported) 10 tablet 0    Blood pressure (!) 159/81, pulse 72, temperature 97.8 F (36.6 C), temperature source Oral, resp. rate 20, SpO2 98 %. Physical Exam:  General: pleasant, elderly,  well developed white male who is laying in bed in NAD.  Sleeping soundly when I came into the room x 2 HEENT: head is normocephalic, atraumatic.  Sclera are noninjected.  Pupils are equal.  Ears  and nose without any masses or lesions.  Mouth is pink and moist Heart: regular, rate, and rhythm.  Normal s1,s2. No obvious murmurs, gallops, or rubs noted.  Palpable radial and pedal pulses bilaterally Lungs: CTAB, no wheezes, rhonchi, or rales noted.  Respiratory effort nonlabored Abd: soft, slightly tender to initial palpation, ND, +BS, no masses, hernias, or organomegaly.  Well healed Right sided scar from prior appendectomy MS: all 4 extremities are symmetrical with no cyanosis, clubbing, or edema..   Skin: warm and dry with no masses. Rash with crusting mostly on the right.  I can't tell if its from anticoagulant or some other source.  Multiple sites of ecchymosis.   Neuro: Cranial nerves 2-12 grossly intact, sensation is normal throughout.  He can move toes,  and raise his lets on both sides without issue lying down. Psych: A&Ox3 with an appropriate affect. Some memory issues on certain things, but he is very sharpe and engaged.        Results for orders placed or performed during the hospital encounter of 06/10/21 (from the past 48 hour(s))  Culture, blood (routine x 2)     Status: None (Preliminary result)   Collection Time: 06/10/21 12:55 PM   Specimen: BLOOD  Result Value Ref Range   Specimen Description BLOOD BLOOD RIGHT FOREARM    Special Requests      BOTTLES DRAWN AEROBIC AND ANAEROBIC Blood Culture adequate volume   Culture      NO GROWTH < 12 HOURS Performed at [facility] [address], [place], [place] [address]    Report Status PENDING   CBC with Differential     Status: Abnormal   Collection Time: 06/10/21  1:10 PM  Result Value Ref Range   WBC 5.7 4.0 - 10.5 K/uL   RBC 4.04 (L) 4.22 - 5.81 MIL/uL   Hemoglobin 13.7 13.0 - 17.0 g/dL   HCT 39.6 39.0 - 52.0 %   MCV 98.0 80.0 - 100.0 fL   MCH 33.9 26.0 - 34.0 pg   MCHC 34.6 30.0 - 36.0 g/dL   RDW 12.7 11.5 - 15.5 %   Platelets 170 150 - 400 K/uL   nRBC 0.0 0.0 - 0.2 %   Neutrophils Relative % 70 %    Neutro Abs 4.0 1.7 - 7.7 K/uL   Lymphocytes Relative 16 %   Lymphs Abs 0.9 0.7 - 4.0 K/uL   Monocytes Relative 10 %   Monocytes Absolute 0.6 0.1 - 1.0 K/uL   Eosinophils Relative 3 %   Eosinophils Absolute 0.1 0.0 - 0.5 K/uL   Basophils Relative 0 %   Basophils Absolute 0.0 0.0 - 0.1 K/uL   Immature Granulocytes 1 %   Abs Immature Granulocytes 0.04 0.00 - 0.07 K/uL    Comment: Performed at [facility] [address] [place], [place] [address]  Comprehensive metabolic panel     Status: Abnormal   Collection Time: 06/10/21  1:10 PM  Result Value Ref Range   Sodium 125 (L) 135 - 145 mmol/L   Potassium 4.2 3.5 - 5.1 mmol/L   Chloride 95 (L) 98 - 111 mmol/L   CO2 23 22 - 32 mmol/L   Glucose, Bld 109 (H) 70 - 99 mg/dL    Comment: Glucose reference range applies only to samples taken after fasting for at least 8 hours.   BUN 14 8 - 23 mg/dL   Creatinine, Ser 0.97 0.61 - 1.24 mg/dL   Calcium 9.6 8.9 - 10.3 mg/dL   Total Protein 7.1 6.5 - 8.1 g/dL   Albumin 3.8 3.5 - 5.0 g/dL   AST 26 15 - 41 U/L   ALT 29 0 - 44 U/L   Alkaline Phosphatase 111 38 - 126 U/L   Total Bilirubin 2.3 (H) 0.3 - 1.2 mg/dL   GFR, Estimated >60 >60 mL/min    Comment: (NOTE) Calculated using the CKD-EPI Creatinine Equation (2021)    Anion gap 7 5 - 15    Comment: Performed at [facility] [address], [place], [place] [address]  Sedimentation rate     Status: None   Collection Time: 06/10/21  1:10 PM  Result Value Ref Range   Sed Rate 15 0 - 16 mm/hr  Comment: Performed at [facility], [place] [address] [place], [place] [address]  C-reactive protein     Status: None   Collection Time: 06/10/21  1:10 PM  Result Value Ref Range   CRP 0.6 <1.0 mg/dL    Comment: Performed at [facility] [address], [place], [place] [address]  Culture, blood (routine x 2)     Status: None (Preliminary result)   Collection Time: 06/10/21  1:10 PM   Specimen: BLOOD  Result Value Ref Range    Specimen Description BLOOD RIGHT ANTECUBITAL    Special Requests      BOTTLES DRAWN AEROBIC AND ANAEROBIC Blood Culture adequate volume   Culture      NO GROWTH < 24 HOURS Performed at [facility], [place] [address], [place], [place] [address]    Report Status PENDING   Troponin I (High Sensitivity)     Status: None   Collection Time: 06/10/21  1:10 PM  Result Value Ref Range   Troponin I (High Sensitivity) 16 <18 ng/L    Comment: (NOTE) Elevated high sensitivity troponin I (hsTnI) values and significant  changes across serial measurements may suggest ACS but many other  chronic and acute conditions are known to elevate hsTnI results.  Refer to the "Links" section for chest pain algorithms and additional  guidance. Performed at [facility], [place] [address], [place], [place] [address]   Brain natriuretic peptide     Status: Abnormal   Collection Time: 06/10/21  1:10 PM  Result Value Ref Range   B Natriuretic Peptide 161.8 (H) 0.0 - 100.0 pg/mL    Comment: Performed at [facility] [address], [place], [place] [address]  Osmolality     Status: Abnormal   Collection Time: 06/10/21  1:10 PM  Result Value Ref Range   Osmolality 271 (L) 275 - 295 mOsm/kg    Comment: Performed at [facility], [place] [address] [place], [place] [address]  TSH     Status: None   Collection Time: 06/10/21  1:10 PM  Result Value Ref Range   TSH 1.875 0.350 - 4.500 uIU/mL    Comment: Performed by a 3rd Generation assay with a functional sensitivity of <=0.01 uIU/mL. Performed at [facility], [place] [address] [place], [place] [address]   Troponin I (High Sensitivity)     Status: None   Collection Time: 06/10/21  3:21 PM  Result Value Ref Range   Troponin I (High Sensitivity) 14 <18 ng/L    Comment: (NOTE) Elevated high sensitivity troponin I (hsTnI) values and significant  changes across serial measurements may suggest ACS but many other  chronic and acute conditions  are known to elevate hsTnI results.  Refer to the "Links" section for chest pain algorithms and additional  guidance. Performed at [facility], [place] [address], [place], [place] [address]   SARS CORONAVIRUS 2 (TAT 6-24 HRS) Nasopharyngeal Nasopharyngeal Swab     Status: None   Collection Time: 06/10/21  7:11 PM   Specimen: Nasopharyngeal Swab  Result Value Ref Range   SARS Coronavirus 2 NEGATIVE NEGATIVE    Comment: (NOTE) SARS-CoV-2 target nucleic acids are NOT DETECTED.  The SARS-CoV-2 RNA is generally detectable in upper and lower respiratory specimens during the acute phase of infection. Negative results do not preclude SARS-CoV-2 infection, do not rule out co-infections with other pathogens, and should not be used as the sole basis for  treatment or other patient management decisions. Negative results must be combined with clinical observations, patient history, and epidemiological information. The expected result is Negative.  Fact Sheet for Patients: SugarRoll.be  Fact Sheet for Healthcare Providers: https://www.woods-mathews.com/  This test is not yet approved or cleared by the [place] FDA and  has been authorized for detection and/or diagnosis of SARS-CoV-2 by FDA under an Emergency Use Authorization (EUA). This EUA will remain  in effect (meaning this test can be used) for the duration of the COVID-19 declaration under Se ction 564(b)(1) of the Act, 21 U.S.C. section 360bbb-3(b)(1), unless the authorization is terminated or revoked sooner.  Performed at [facility], [place] [address], [place], [place] [address]   Urinalysis, Routine w reflex microscopic     Status: Abnormal   Collection Time: 06/10/21  9:00 PM  Result Value Ref Range   Color, Urine YELLOW YELLOW   APPearance HAZY (A) CLEAR   Specific Gravity, Urine 1.008 1.005 - 1.030   pH 7.0 5.0 - 8.0   Glucose, UA NEGATIVE NEGATIVE mg/dL   Hgb urine  dipstick NEGATIVE NEGATIVE   Bilirubin Urine NEGATIVE NEGATIVE   Ketones, ur NEGATIVE NEGATIVE mg/dL   Protein, ur NEGATIVE NEGATIVE mg/dL   Nitrite NEGATIVE NEGATIVE   Leukocytes,Ua MODERATE (A) NEGATIVE   RBC / HPF 6-10 0 - 5 RBC/hpf   WBC, UA 11-20 0 - 5 WBC/hpf   Bacteria, UA RARE (A) NONE SEEN   Squamous Epithelial / LPF 0-5 0 - 5    Comment: Performed at [facility] [address], [place], [place] [id number]  Basic metabolic panel     Status: Abnormal   Collection Time: 06/11/21  1:43 AM  Result Value Ref Range   Sodium 125 (L) 135 - 145 mmol/L   Potassium 3.8 3.5 - 5.1 mmol/L   Chloride 97 (L) 98 - 111 mmol/L   CO2 21 (L) 22 - 32 mmol/L   Glucose, Bld 116 (H) 70 - 99 mg/dL    Comment: Glucose reference range applies only to samples taken after fasting for at least 8 hours.   BUN 13 8 - 23 mg/dL   Creatinine, Ser 0.86 0.61 - 1.24 mg/dL   Calcium 9.2 8.9 - 10.3 mg/dL   GFR, Estimated >60 >60 mL/min    Comment: (NOTE) Calculated using the CKD-EPI Creatinine Equation (2021)    Anion gap 7 5 - 15    Comment: Performed at [facility] [address], [place], [place] [address]  CBC     Status: Abnormal   Collection Time: 06/11/21  1:43 AM  Result Value Ref Range   WBC 5.9 4.0 - 10.5 K/uL   RBC 3.81 (L) 4.22 - 5.81 MIL/uL   Hemoglobin 13.0 13.0 - 17.0 g/dL   HCT 36.9 (L) 39.0 - 52.0 %   MCV 96.9 80.0 - 100.0 fL   MCH 34.1 (H) 26.0 - 34.0 pg   MCHC 35.2 30.0 - 36.0 g/dL   RDW 12.9 11.5 - 15.5 %   Platelets 168 150 - 400 K/uL   nRBC 0.0 0.0 - 0.2 %    Comment: Performed at [facility], [place] [address], [place], [place] [address]  Uric acid     Status: None   Collection Time: 06/11/21  1:43 AM  Result Value Ref Range   Uric Acid, Serum 4.1 3.7 - 8.6 mg/dL    Comment: Performed at [facility]  [address] [place], [place] [address]   DG Chest Port 1 View  Result Date: 06/11/2021 CLINICAL DATA:  Bilateral lower extremity weakness EXAM:  PORTABLE CHEST 1 VIEW COMPARISON:  Chest x-ray dated October 24th 2021 FINDINGS: Normal cardiac contours. Tortuosity of the thoracic aorta. Elevation of the left hemidiaphragm. Unchanged left basilar reticular opacities, likely scarring. No new parenchymal process. Blunting of the left costophrenic angle, possibly due to pleural thickening versus trace pleural effusion, unchanged from prior. No large pleural effusion or pneumothorax. IMPRESSION: No active disease. Electronically Signed   By: Yetta Glassman M.D.   On: 06/11/2021 08:44      Assessment/Plan  Rash and bilateral lower extremity weakness Hx shingles/treated with valacyclovir CAD Atrial fibrillation on Eliquis (last dose 06/11/2021) Hx CHF Hx hyperlipidemia Hypertension DNR  Plan: Skin biopsy right lower extremity. - completed see note below.  Procedure Note  Procedure:  Skin biopsy RLE above the knee.  2 - 4 mm punch biopsies taken from a lesion just above the knee on the right leg  Note:  A site was picked  that is just above the knee on the right side.  The site was an ecchymotic crusted site like those seen above.  It was cleaned with betadine swabs.  We used 5 ml of 2% plain lidocaine for anesthesia, intradermally.  After adequate anesthesia a 4 mm skin punch was used.  The skin was biopsied at the margin of the ecchymotic crusted area.  Both were then place in a sterile plain container, with about 5 ml of sterile saline in each.  Direct pressure and Gelfoam gauze was used to control the bleeding.  A moderated pressure dressing using Gelfoam, sterile gauze, and ACE was used after the bleeding was controlled. I took the specimens to the Histology and Microbiology labs myself.  Specimens were in sterile container with sterile saline and I told the tech at each site what it was in.      Earnstine Regal [facility] Surgery 06/11/2021, 11:06 AM Please see Amion for pager number during day hours 7:00am-4:30pm

## 2021-06-11 NOTE — Progress Notes (Signed)
Inpatient Rehab Admissions Coordinator:   Per therapy recommendations, patient was screened for CIR candidacy by Clemens Catholic, MS, CCC-SLP. At this time, Pt.  Remains observation status and Pt. does not appear to demonstrate medical necessity to justify in hospital rehabilitation/CIR at this time. I will not pursue a rehab consult for this Pt.   Recommend other rehab venues be pursued.  Please contact me with any questions.  Clemens Catholic, [place], [place] Admissions Coordinator  [phone] ([place]) [phone] (office)

## 2021-06-11 NOTE — Evaluation (Signed)
Physical Therapy Evaluation Patient Details Name: Parker Odonnell. MRN: [id number] DOB: July 20, 1932 Today's Date: 06/11/2021   History of Present Illness  87 y.o. male  presented 06/10/21 with rash, weakness. 8/14 at [facility] for same; ?shingles, started acyclovir and discharged. Rash did not improve.  PMH significant of CAD; CHF; HLD; afib on Eliquis; and HTN  Clinical Impression   Pt admitted secondary to problem above with deficits below. PTA patient was independent with cane and living alone in an ILF. Pt developed bil LE weakness ~8/14 and sustained a fall (per son).  Pt currently requires min assist for all mobility and recommend +2 for ambulation (for safety and pt confidence--pt fearful of falling).  Anticipate patient will benefit from PT to address problems listed below.Will continue to follow acutely to maximize functional mobility independence and safety.       Follow Up Recommendations CIR    Equipment Recommendations  Rolling walker with 5" wheels    Recommendations for Other Services Rehab consult;OT consult     Precautions / Restrictions Precautions Precautions: Fall Precaution Comments: per son, 8/14 after legs began feeling weak Restrictions Weight Bearing Restrictions: No      Mobility  Bed Mobility Overal bed mobility: Needs Assistance Bed Mobility: Supine to Sit     Supine to sit: Min guard     General bed mobility comments: incr time and cues to get out to EOB with feet on the floor; near need for assist    Transfers Overall transfer level: Needs assistance Equipment used: Rolling walker (2 wheeled) Transfers: Sit to/from Stand Sit to Stand: Min assist         General transfer comment: boost during initial 1/3 of movement; steadying assist upon standing  Ambulation/Gait Ambulation/Gait assistance: Min assist Gait Distance (Feet): 3 Feet Assistive device: Rolling walker (2 wheeled) Gait Pattern/deviations: Step-to pattern     General Gait  Details: bed to chair only; good foot clearance bil; short step length; reports legs feeling weak  Stairs            Wheelchair Mobility    Modified Rankin (Stroke Patients Only)       Balance Overall balance assessment: Needs assistance Sitting-balance support: No upper extremity supported;Feet supported Sitting balance-Leahy Scale: Fair     Standing balance support: Bilateral upper extremity supported Standing balance-Leahy Scale: Poor Standing balance comment: reliant on RW/UE support                             Pertinent Vitals/Pain Pain Assessment: Faces Faces Pain Scale: Hurts little more Pain Location: rt foot Pain Descriptors / Indicators: Discomfort Pain Intervention(s): Limited activity within patient's tolerance;Monitored during session    Home Living Family/patient expects to be discharged to:: Unsure                 Additional Comments: Patient lives in [place] with no other levels of care. Recommending post-acute rehab    Prior Function Level of Independence: Independent with assistive device(s)         Comments: prior to recent onset of leg weakness, modified independent with mobility using cane     Hand Dominance   Dominant Hand: Right    Extremity/Trunk Assessment   Upper Extremity Assessment Upper Extremity Assessment: Defer to OT evaluation (rt biceps/triceps weaker than left)    Lower Extremity Assessment Lower Extremity Assessment: RLE deficits/detail;LLE deficits/detail RLE Deficits / Details: hip/knee extensors 3+; ankle DF 5/5, PF 4/5  RLE Sensation: history of peripheral neuropathy (hypersensitive Rt foot) LLE Deficits / Details: hip/knee extensors 4/5, ankle DF 5/5, PF 4/5 LLE Sensation: history of peripheral neuropathy    Cervical / Trunk Assessment Cervical / Trunk Assessment: Kyphotic  Communication   Communication: No difficulties  Cognition Arousal/Alertness: Awake/alert Behavior During Therapy: WFL  for tasks assessed/performed Overall Cognitive Status: Within Functional Limits for tasks assessed                                 General Comments: pt a&ox4      General Comments General comments (skin integrity, edema, etc.): Son, Lennette Bihari, present during evaluation. Both pt/son expressed frustration at not knowing the cause of his weakness.    Exercises     Assessment/Plan    PT Assessment Patient needs continued PT services  PT Problem List Decreased strength;Decreased balance;Decreased mobility;Decreased knowledge of use of DME;Impaired sensation;Pain;Decreased skin integrity       PT Treatment Interventions DME instruction;Gait training;Functional mobility training;Therapeutic activities;Therapeutic exercise;Balance training;Patient/family education    PT Goals (Current goals can be found in the Care Plan section)  Acute Rehab PT Goals Patient Stated Goal: find out why he is weak PT Goal Formulation: With patient/family Time For Goal Achievement: 06/25/21 Potential to Achieve Goals: Good    Frequency Min 3X/week   Barriers to discharge Decreased caregiver support lives alone    Co-evaluation               AM-PAC PT "6 Clicks" Mobility  Outcome Measure Help needed turning from your back to your side while in a flat bed without using bedrails?: None Help needed moving from lying on your back to sitting on the side of a flat bed without using bedrails?: A Little Help needed moving to and from a bed to a chair (including a wheelchair)?: A Little Help needed standing up from a chair using your arms (e.g., wheelchair or bedside chair)?: A Little Help needed to walk in hospital room?: Total Help needed climbing 3-5 steps with a railing? : Total 6 Click Score: 15    End of Session Equipment Utilized During Treatment: Gait belt Activity Tolerance: Patient tolerated treatment well Patient left: in chair;with call bell/phone within reach;with chair alarm  set;with family/visitor present Nurse Communication: Mobility status PT Visit Diagnosis: Other abnormalities of gait and mobility (R26.89);Muscle weakness (generalized) (M62.81);History of falling (Z91.81)    Time: [id number] PT Time Calculation (min) (ACUTE ONLY): 32 min   Charges:   PT Evaluation $PT Eval Moderate Complexity: 1 Mod PT Treatments $Therapeutic Activity: 8-22 mins         Arby Barrette, PT Pager [phone]   Rexanne Mano 06/11/2021, 9:35 AM

## 2021-06-11 NOTE — Progress Notes (Signed)
Initial Nutrition Assessment  DOCUMENTATION CODES:   Not applicable  INTERVENTION:   -Ensure Enlive po BID, each supplement provides 350 kcal and 20 grams of protein  -MVI with minerals daily  NUTRITION DIAGNOSIS:   Increased nutrient needs related to acute illness as evidenced by estimated needs.  GOAL:   Patient will meet greater than or equal to 90% of their needs  MONITOR:   PO intake, Supplement acceptance, Labs, Weight trends, Skin, I & O's  REASON FOR ASSESSMENT:   Consult Assessment of nutrition requirement/status  ASSESSMENT:   Parker Odonnell. is a 87 y.o. male with medical history significant of CAD; CHF; HLD; afib on Eliquis; and HTN presenting with rash, weakness.  On 8/14, he was seen at [facility] for leg weakness, acute onset.  Pt admitted with LE rash, weakness.   Pt very lethargic at time of visit. He did not respond to voice or touch.   No meal documentation available to assess at this time.   Reviewed wt hx; wt has been stable over the past 5 months.    Medications reviewed and include colace.   Labs reviewed: Na: 125.    NUTRITION - FOCUSED PHYSICAL EXAM:  Flowsheet Row Most Recent Value  Orbital Region No depletion  Upper Arm Region No depletion  Thoracic and Lumbar Region No depletion  Buccal Region No depletion  Temple Region No depletion  Clavicle Bone Region No depletion  Clavicle and Acromion Bone Region No depletion  Scapular Bone Region No depletion  Dorsal Hand No depletion  Patellar Region No depletion  Anterior Thigh Region No depletion  Posterior Calf Region No depletion  Edema (RD Assessment) None  Hair Reviewed  Eyes Reviewed  Mouth Reviewed  Skin Reviewed  Nails Reviewed       Diet Order:   Diet Order             Diet regular Room service appropriate? Yes; Fluid consistency: Thin  Diet effective now                   EDUCATION NEEDS:   No education needs have been identified at this time  Skin:  Skin  Assessment: Reviewed RN Assessment  Last BM:  06/11/21  Height:   Ht Readings from Last 1 Encounters:  06/01/21 '5\' 8"'$  (1.727 m)    Weight:   Wt Readings from Last 1 Encounters:  06/01/21 66.2 kg    Ideal Body Weight:  70 kg  BMI:  There is no height or weight on file to calculate BMI.  Estimated Nutritional Needs:   Kcal:  [id number]  Protein:  85-100 grams  Fluid:  > 1.7 L    Loistine Chance, RD, LDN, [place] Registered Dietitian II Certified Diabetes Care and Education Specialist Please refer to [facility] for RD and/or RD on-call/weekend/after hours pager

## 2021-06-11 NOTE — Plan of Care (Signed)
  Problem: Health Behavior/Discharge Planning: Goal: Ability to manage health-related needs will improve Outcome: Progressing   Problem: Education: Goal: Knowledge of General Education information will improve Description: Including pain rating scale, medication(s)/side effects and non-pharmacologic comfort measures Outcome: Progressing   

## 2021-06-11 NOTE — Progress Notes (Signed)
CSW left voicemail for patient's daughter, [name].  Gilmore Laroche, MSW, [facility]

## 2021-06-11 NOTE — Evaluation (Signed)
Occupational Therapy Evaluation Patient Details Name: Parker Odonnell. MRN: [id number] DOB: 1932/05/14 Today's Date: 06/11/2021    History of Present Illness 87 y.o. male  presented 06/10/21 with rash, weakness. 8/14 at [facility] for same; ?shingles, started acyclovir and discharged. Rash did not improve.  PMH significant of CAD; CHF; HLD; afib on Eliquis; and HTN.   Clinical Impression   Patient admitted for the diagnosis above.  PTA he was living alone in an ILF setting.  He did have PRN assist with meds, community mobility, meals and showers.  Deficits are listed below.  Currently he is needing up to [place] for basic transfers, and up to Mod A for lower body ADL sitting.  OT is indicated in the acute setting to maximize his functional status.  SNF is recommended, as he will need 24 hour assist, and that is not available at his ILF.      Follow Up Recommendations  SNF    Equipment Recommendations  Wheelchair (measurements OT);Wheelchair cushion (measurements OT)    Recommendations for Other Services       Precautions / Restrictions Precautions Precautions: Fall Restrictions Weight Bearing Restrictions: No      Mobility Bed Mobility Overal bed mobility: Needs Assistance Bed Mobility: Supine to Sit;Sit to Supine     Supine to sit: Min guard Sit to supine: Min assist     Patient Response: Cooperative  Transfers   Equipment used: Rolling walker (2 wheeled) Transfers: Sit to/from Stand Sit to Stand: Min assist              Balance Overall balance assessment: Needs assistance Sitting-balance support: No upper extremity supported;Feet supported Sitting balance-Leahy Scale: Fair     Standing balance support: Bilateral upper extremity supported Standing balance-Leahy Scale: Poor                             ADL either performed or assessed with clinical judgement   ADL Overall ADL's : Needs assistance/impaired Eating/Feeding: Set up;Sitting   Grooming:  Wash/dry hands;Wash/dry face;Set up;Sitting   Upper Body Bathing: Set up;Sitting   Lower Body Bathing: Moderate assistance;Sitting/lateral leans   Upper Body Dressing : Minimal assistance;Sitting   Lower Body Dressing: Moderate assistance;Sitting/lateral leans   Toilet Transfer: Minimal assistance;BSC;Stand-pivot           Functional mobility during ADLs: Minimal assistance;Rolling walker       Vision Baseline Vision/History: 2 Legally blind;4 Cataracts Ability to See in Adequate Light: 1 Impaired Patient Visual Report: No change from baseline       Perception     Praxis      Pertinent Vitals/Pain Pain Assessment: Faces Faces Pain Scale: Hurts a little bit Pain Location: rt foot Pain Descriptors / Indicators: Tender Pain Intervention(s): Monitored during session     Hand Dominance Right   Extremity/Trunk Assessment Upper Extremity Assessment Upper Extremity Assessment: Generalized weakness   Lower Extremity Assessment Lower Extremity Assessment: Defer to PT evaluation   Cervical / Trunk Assessment Cervical / Trunk Assessment: Kyphotic   Communication Communication Communication: No difficulties   Cognition Arousal/Alertness: Awake/alert Behavior During Therapy: WFL for tasks assessed/performed Overall Cognitive Status: Within Functional Limits for tasks assessed                                     General Comments  fearful of falling and very unsure of  his abilities.    Exercises     Shoulder Instructions      Home Living Family/patient expects to be discharged to:: Private residence Living Arrangements: Alone Available Help at Discharge: Family;Available PRN/intermittently;Other (Comment) (facility staff assist with showers and meds.) Type of Home: Apartment Home Access: Level entry;Elevator     Home Layout: One level     Bathroom Shower/Tub: Occupational psychologist: Handicapped height Bathroom Accessibility:  Yes How Accessible: Accessible via wheelchair Home Equipment: [place] - 2 wheels;Grab bars - tub/shower;Grab bars - toilet;Shower seat          Prior Functioning/Environment Level of Independence: Needs assistance  Gait / Transfers Assistance Needed: Patient walks to dinning room with RW, uses elevator from 2 nd floor to 1st floor. ADL's / Homemaking Assistance Needed: Patient independent with sink baths, facility prepars meals, assist with meds, showers  twice a week with supervision/assist as needed.            OT Problem List: Decreased strength;Decreased range of motion;Decreased activity tolerance;Impaired balance (sitting and/or standing);Decreased knowledge of use of DME or AE;Pain      OT Treatment/Interventions: Self-care/ADL training;Therapeutic exercise;DME and/or AE instruction;Balance training;Patient/family education;Therapeutic activities    OT Goals(Current goals can be found in the care plan section) Acute Rehab OT Goals Patient Stated Goal: Hoping to find out why he's not walking OT Goal Formulation: With patient Time For Goal Achievement: 06/25/21 Potential to Achieve Goals: Fair ADL Goals Pt Will Perform Grooming: sitting;standing;with min guard assist Pt Will Perform Lower Body Bathing: with min assist;sit to/from stand Pt Will Perform Lower Body Dressing: with min assist;sit to/from stand Pt Will Transfer to Toilet: with min assist;ambulating;regular height toilet  OT Frequency: Min 2X/week   Barriers to D/C: Decreased caregiver support          Co-evaluation              AM-PAC OT "6 Clicks" Daily Activity     Outcome Measure Help from another person eating meals?: None Help from another person taking care of personal grooming?: None Help from another person toileting, which includes using toliet, bedpan, or urinal?: A Lot Help from another person bathing (including washing, rinsing, drying)?: A Lot Help from another person to put on and  taking off regular upper body clothing?: A Little Help from another person to put on and taking off regular lower body clothing?: A Lot 6 Click Score: 17   End of Session Equipment Utilized During Treatment: Gait belt;Rolling walker  Activity Tolerance: Other (comment) (limited by fear of falling) Patient left: in bed;with call bell/phone within reach;with bed alarm set  OT Visit Diagnosis: Unsteadiness on feet (R26.81);Other abnormalities of gait and mobility (R26.89);Muscle weakness (generalized) (M62.81);Pain Pain - Right/Left: Right Pain - part of body: Ankle and joints of foot                Time: 1411-1431 OT Time Calculation (min): 20 min Charges:  OT General Charges $OT Visit: 1 Visit OT Evaluation $OT Eval Moderate Complexity: 1 Mod  06/11/2021  RP, OTR/L  Acute Rehabilitation Services  Office:  [place] 06/11/2021, 3:37 PM

## 2021-06-11 NOTE — Consult Note (Signed)
[place] for Infectious Disease    Date of Admission:  06/10/2021     Total days of antibiotics 0               Reason for Consult: Rash  Referring Provider: [place] Primary Care Provider: Lawerance Cruel, MD   ASSESSMENT:  Parker Odonnell is an 87 y/o Caucasian male admitted with progressive lower extremity weakness and rash refractory to valacyclovir for suspected varicella zoster. This does not appear to be infectious in origin and would favor vasculitis. Neurology evaluating for vasculitis and paraneoplastic disease. Skin biopsy obtained by General Surgery. Recommend holding antibiotics at this point pending further pathology and neurology evaluation. Continue supportive care per primary team as needed.   PLAN:  Hold antibiotics as this does not appear infectious. Await biopsy and additional neurology testing results. Continue supportive care as needed per primary team.   ID will sign off. Please re-consult as needed.    Principal Problem:   Rash Active Problems:   Dyslipidemia   Essential hypertension   Atrial flutter (HCC)   Weakness   Chronic combined systolic (congestive) and diastolic (congestive) heart failure (HCC)   DNR (do not resuscitate)    apixaban  2.5 mg Oral BID   atorvastatin  40 mg Oral QHS   carvedilol  3.125 mg Oral BID WC   docusate sodium  100 mg Oral BID   lidocaine  10 mL Intradermal Once   losartan  50 mg Oral Daily   mupirocin cream   Topical BID     HPI: Parker Odonnell. is a 87 y.o. male with previous medical history significant for CAD, hypertension, atrial fibrillation on anticoagulation, and shingles admitted with progressive weakness and rash of the lower right extremity.  Parker Odonnell was intially seen in the ED on 06/01/21 arriving from his living facility with right knee and right lower extremity pain with his leg giving out due to pain. X-ray was negative for fracture and showed extensive degenerative joint disease. Rash  was believed to be consistent with shingles and was prescribed valacyclovir.   Parker Odonnell returned on 06/10/21 with progressive weakness and pain in his lower extremities. No new imaging obtained. CRP 0.6 and ESR 15. Exam remained consistent with purpuric rash. Hospitalist discussed in brief with dermatology collegue with differentials including vasculitis, calciphylaxis, infectious or possibly embolic. General Surgery request for biopsy. Blood cultures have been without growth in <12 hours. Afebrile since admission and has not received antibiotics.    Review of Systems: Review of Systems  Constitutional:  Negative for chills, fever and weight loss.  Respiratory:  Negative for cough, shortness of breath and wheezing.   Cardiovascular:  Negative for chest pain and leg swelling.  Gastrointestinal:  Negative for abdominal pain, constipation, diarrhea, nausea and vomiting.  Skin:  Positive for rash.    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   Cancer (HCC)    CHF (congestive heart failure) (HCC)    Coronary atherosclerosis of native coronary artery    DNR (do not resuscitate) 06/10/2021   Dyslipidemia    ED (erectile dysfunction)    GERD (gastroesophageal reflux disease)    hx of   History of kidney stones    Hypertension    Inguinal hernia    Left bundle branch block    chronic   Postsurgical percutaneous transluminal coronary angioplasty status  Shingles     Social History   Tobacco Use   Smoking status: Never   Smokeless tobacco: Never  Vaping Use   Vaping Use: Never used  Substance Use Topics   Alcohol use: No   Drug use: No    Family History  Problem Relation Age of Onset   Arthritis Father    Hypertension Paternal Uncle    Heart attack Neg Hx    Stroke Neg Hx     No Known Allergies  OBJECTIVE: Blood pressure (!) 159/81, pulse 72, temperature 97.8 F (36.6 C), temperature source  Oral, resp. rate 20, SpO2 98 %.  Physical Exam Constitutional:      General: He is not in acute distress.    Appearance: He is well-developed.     Comments: Lying in bed with head of bed elevated; pleasant.   Cardiovascular:     Rate and Rhythm: Normal rate and regular rhythm.     Heart sounds: Normal heart sounds.  Pulmonary:     Effort: Pulmonary effort is normal.     Breath sounds: Normal breath sounds.  Skin:    General: Skin is warm and dry.     Comments: Scattered purpuric rash along the right lower extremity with various sized areas. There is mild warmth compared to the contralateral side. Bilateral distal lower extremities are very sensitive to touch.   Neurological:     Mental Status: He is alert and oriented to person, place, and time.  Psychiatric:        Behavior: Behavior normal.        Thought Content: Thought content normal.        Judgment: Judgment normal.    Lab Results Lab Results  Component Value Date   WBC 5.9 06/11/2021   HGB 13.0 06/11/2021   HCT 36.9 (L) 06/11/2021   MCV 96.9 06/11/2021   PLT 168 06/11/2021    Lab Results  Component Value Date   CREATININE 0.86 06/11/2021   BUN 13 06/11/2021   NA 125 (L) 06/11/2021   K 3.8 06/11/2021   CL 97 (L) 06/11/2021   CO2 21 (L) 06/11/2021    Lab Results  Component Value Date   ALT 29 06/10/2021   AST 26 06/10/2021   ALKPHOS 111 06/10/2021   BILITOT 2.3 (H) 06/10/2021     Microbiology: Recent Results (from the past 240 hour(s))  Culture, blood (routine x 2)     Status: None (Preliminary result)   Collection Time: 06/10/21 12:55 PM   Specimen: BLOOD  Result Value Ref Range Status   Specimen Description BLOOD BLOOD RIGHT FOREARM  Final   Special Requests   Final    BOTTLES DRAWN AEROBIC AND ANAEROBIC Blood Culture adequate volume   Culture   Final    NO GROWTH < 12 HOURS Performed at [facility], [place] [address], [place], [place] [address]    Report Status PENDING  Incomplete  Culture,  blood (routine x 2)     Status: None (Preliminary result)   Collection Time: 06/10/21  1:10 PM   Specimen: BLOOD  Result Value Ref Range Status   Specimen Description BLOOD RIGHT ANTECUBITAL  Final   Special Requests   Final    BOTTLES DRAWN AEROBIC AND ANAEROBIC Blood Culture adequate volume   Culture   Final    NO GROWTH < 24 HOURS Performed at [facility], [place] [address], [place], [place] [address]    Report Status PENDING  Incomplete  SARS CORONAVIRUS 2 (TAT 6-24 HRS) Nasopharyngeal Nasopharyngeal Swab     Status: None   Collection Time: 06/10/21  7:11 PM   Specimen: Nasopharyngeal Swab  Result Value Ref Range Status   SARS Coronavirus 2 NEGATIVE NEGATIVE Final    Comment: (NOTE) SARS-CoV-2 target nucleic acids are NOT DETECTED.  The SARS-CoV-2 RNA is generally detectable in upper and lower respiratory specimens during the acute phase of infection. Negative results do not preclude SARS-CoV-2 infection, do not rule out co-infections with other pathogens, and should not be used as the sole basis for treatment or other patient management decisions. Negative results must be combined with clinical observations, patient history, and epidemiological information. The expected result is Negative.  Fact Sheet for Patients: SugarRoll.be  Fact Sheet for Healthcare Providers: https://www.woods-mathews.com/  This test is not yet approved or cleared by the [place] FDA and  has been authorized for detection and/or diagnosis of SARS-CoV-2 by FDA under an Emergency Use Authorization (EUA). This EUA will remain  in effect (meaning this test can be used) for the duration of the COVID-19 declaration under Se ction 564(b)(1) of the Act, 21 U.S.C. section 360bbb-3(b)(1), unless the authorization is terminated or revoked sooner.  Performed at [facility], [place] [address], [place], [place] [address]      Terri Piedra, [place] for Infectious Disease [facility]  06/11/2021  12:07 PM

## 2021-06-11 NOTE — Progress Notes (Signed)
PROGRESS NOTE        PATIENT DETAILS Name: [place]. Age: 87 y.o. Sex: male Date of Birth: 17-Jan-1932 Admit Date: 06/10/2021 Admitting Physician Karmen Bongo, MD [id number], Dwyane Luo, MD  Brief Narrative: Patient is a 87 y.o. male persistent atrial fibrillation on Eliquis, HFrEF, HTN who presented to the hospital with right leg weakness and a purpuric right leg rash.  Significant events: 8/14>> presented to ED with right leg weakness/pain-fall-found to have rash-thought to have shingles-started on Valtrex and discharged  8/23>> admit to [facility] for right leg weakness/purpuric right leg rash eval/management  Significant studies: 8/14>> CT head: No acute intracranial findings. 8/14>>X-ray right hip: Degenerative changes in the right hip joint. 8/14>> x-ray right knee: Degenerative changes without any acute bony abnormality. 8/24>> CXR: No pneumonia.  Antimicrobial therapy: None  Microbiology data: 8/23>> COVID PCR: Negative 8/23>> blood culture: No growth  Procedures : None  Consults: Neuro, ID  DVT Prophylaxis : apixaban (ELIQUIS) tablet 2.5 mg Start: 06/10/21 2200 apixaban (ELIQUIS) tablet 2.5 mg    Subjective: Not the best historian-but denies any chest pain or shortness of breath.  Claims he just is not able to lift his right lower extremity-due to weakness.  He denies that this is from pain (has significant OA and hip and knee.   Assessment/Plan: Right leg weakness/purpuric right lower extremity rash: Unclear whether this is related or a separate issue.  No fever-no clinical signs or symptoms consistent with meningitis or encephalitis.  He appears to have some hyperesthesia-tenderness in his Achilles tendon as well.  Broad differentials at this point as noted in H&P-rash is not very consistent with shingles.  In any event-no active blistering seen-he has already completed a course of Valtrex.  Discussed with neurology-Dr Bhagat-we  will evaluate and order MRI imaging based upon her exam.  Discussed with ID-we will evaluate and provide recommendations.  General surgery will plan on biopsy-have requested that we send specimen for cultures and pathology.  In the meantime-continue PT/OT and other supportive care.  Hyponatremia: No improvement overnight with hydration-volume status appears relatively stable-no evidence of dehydration.  Continue gentle hydration and reassess in a.m.  Persistent atrial fibrillation: Continue Coreg-on Eliquis-discussed with neurology MD-if deemed to require LP-Coreg will need to be held.  Await further input.  HFrEF: Volume status is stable-continue to hold diuretics for now.  History of CAD-s/p remote PCI: No anginal symptoms.  HLD: Continue statin  HTN: BP stable-continue losartan/Coreg-follow and adjust.  Diet: Diet Order             Diet regular Room service appropriate? Yes; Fluid consistency: Thin  Diet effective now                    Code Status:  DNR  Family Communication: Son at bedside  Disposition Plan: Status is: Observation  The patient will require care spanning > 2 midnights and should be moved to inpatient because: Inpatient level of care appropriate due to severity of illness  Dispo: The patient is from: Home              Anticipated d/c is to: SNF              Patient currently is not medically stable to d/c.   Difficult to place patient No     Barriers to Discharge: Right lower  extremity weakness-rash of unknown etiology-work-up in progress-also appears to have severe hyponatremia not responsive to IV fluids-work-up in progress.  Not stable for discharge-as disposition not clear-still with electrolyte abnormalities-work-up in progress for his neurological issues.  Antimicrobial agents: Anti-infectives (From admission, onward)    None        Time spent: 35 minutes-Greater than 50% of this time was spent in counseling, explanation of diagnosis,  planning of further management, and coordination of care.  MEDICATIONS: Scheduled Meds:  apixaban  2.5 mg Oral BID   atorvastatin  40 mg Oral QHS   carvedilol  3.125 mg Oral BID WC   docusate sodium  100 mg Oral BID   lidocaine  10 mL Intradermal Once   losartan  50 mg Oral Daily   mupirocin cream   Topical BID   Continuous Infusions:  lactated ringers 75 mL/hr at 06/10/21 1847   PRN Meds:.acetaminophen **OR** acetaminophen, bisacodyl, hydrALAZINE, HYDROcodone-acetaminophen, morphine injection, ondansetron **OR** ondansetron (ZOFRAN) IV, polyethylene glycol, polyvinyl alcohol, traMADol   PHYSICAL EXAM: Vital signs: Vitals:   06/10/21 2207 06/10/21 2300 06/11/21 0130 06/11/21 0722  BP:  140/73 (!) 152/79 (!) 159/81  Pulse:  64 72 72  Resp:  '16 19 20  '$ Temp: 97.7 F (36.5 C)  97.7 F (36.5 C) 97.8 F (36.6 C)  TempSrc: Oral  Oral Oral  SpO2:  99% 96% 98%   There were no vitals filed for this visit. There is no height or weight on file to calculate BMI.   Gen Exam:Alert awake-not in any distress HEENT:atraumatic, normocephalic Chest: B/L clear to auscultation anteriorly CVS:S1S2 regular Abdomen:soft non tender, non distended Extremities:no edema Neurology: Mostly with right leg weakness-approximately 3+/5 Skin: Purpuric rash from second toe-all the way to the thigh on the right lower extremity.  I have personally reviewed following labs and imaging studies  LABORATORY DATA: CBC: Recent Labs  Lab 06/10/21 1310 06/11/21 0143  WBC 5.7 5.9  NEUTROABS 4.0  --   HGB 13.7 13.0  HCT 39.6 36.9*  MCV 98.0 96.9  PLT 170 [id number]    Basic Metabolic Panel: Recent Labs  Lab 06/10/21 1310 06/11/21 0143  NA 125* 125*  K 4.2 3.8  CL 95* 97*  CO2 23 21*  GLUCOSE 109* 116*  BUN 14 13  CREATININE 0.97 0.86  CALCIUM 9.6 9.2    GFR: Estimated Creatinine Clearance: 57.7 mL/min (by C-G formula based on SCr of 0.86 mg/dL).  Liver Function Tests: Recent Labs  Lab  06/10/21 1310  AST 26  ALT 29  ALKPHOS 111  BILITOT 2.3*  PROT 7.1  ALBUMIN 3.8   No results for input(s): LIPASE, AMYLASE in the last 168 hours. No results for input(s): AMMONIA in the last 168 hours.  Coagulation Profile: No results for input(s): INR, PROTIME in the last 168 hours.  Cardiac Enzymes: No results for input(s): CKTOTAL, CKMB, CKMBINDEX, TROPONINI in the last 168 hours.  BNP (last 3 results) No results for input(s): PROBNP in the last 8760 hours.  Lipid Profile: No results for input(s): CHOL, HDL, LDLCALC, TRIG, CHOLHDL, LDLDIRECT in the last 72 hours.  Thyroid Function Tests: Recent Labs    06/10/21 1310  TSH 1.875    Anemia Panel: No results for input(s): VITAMINB12, FOLATE, FERRITIN, TIBC, IRON, RETICCTPCT in the last 72 hours.  Urine analysis:    Component Value Date/Time   COLORURINE YELLOW 06/10/2021 2100   APPEARANCEUR HAZY (A) 06/10/2021 2100   LABSPEC 1.008 06/10/2021 2100   PHURINE 7.0 06/10/2021  2100   GLUCOSEU NEGATIVE 06/10/2021 2100   HGBUR NEGATIVE 06/10/2021 2100   [place] NEGATIVE 06/10/2021 2100   KETONESUR NEGATIVE 06/10/2021 2100   PROTEINUR NEGATIVE 06/10/2021 2100   UROBILINOGEN 0.2 06/13/2012 1027   NITRITE NEGATIVE 06/10/2021 2100   LEUKOCYTESUR MODERATE (A) 06/10/2021 2100    Sepsis Labs: Lactic Acid, Venous No results found for: LATICACIDVEN  MICROBIOLOGY: Recent Results (from the past 240 hour(s))  Culture, blood (routine x 2)     Status: None (Preliminary result)   Collection Time: 06/10/21 12:55 PM   Specimen: BLOOD  Result Value Ref Range Status   Specimen Description BLOOD BLOOD RIGHT FOREARM  Final   Special Requests   Final    BOTTLES DRAWN AEROBIC AND ANAEROBIC Blood Culture adequate volume   Culture   Final    NO GROWTH < 12 HOURS Performed at [facility], [place] [address], [place], [place] [address]    Report Status PENDING  Incomplete  Culture, blood (routine x 2)     Status: None  (Preliminary result)   Collection Time: 06/10/21  1:10 PM   Specimen: BLOOD  Result Value Ref Range Status   Specimen Description BLOOD RIGHT ANTECUBITAL  Final   Special Requests   Final    BOTTLES DRAWN AEROBIC AND ANAEROBIC Blood Culture adequate volume   Culture   Final    NO GROWTH < 24 HOURS Performed at [facility], [place] [address] [place], [place] [address]    Report Status PENDING  Incomplete  SARS CORONAVIRUS 2 (TAT 6-24 HRS) Nasopharyngeal Nasopharyngeal Swab     Status: None   Collection Time: 06/10/21  7:11 PM   Specimen: Nasopharyngeal Swab  Result Value Ref Range Status   SARS Coronavirus 2 NEGATIVE NEGATIVE Final    Comment: (NOTE) SARS-CoV-2 target nucleic acids are NOT DETECTED.  The SARS-CoV-2 RNA is generally detectable in upper and lower respiratory specimens during the acute phase of infection. Negative results do not preclude SARS-CoV-2 infection, do not rule out co-infections with other pathogens, and should not be used as the sole basis for treatment or other patient management decisions. Negative results must be combined with clinical observations, patient history, and epidemiological information. The expected result is Negative.  Fact Sheet for Patients: SugarRoll.be  Fact Sheet for Healthcare Providers: https://www.woods-mathews.com/  This test is not yet approved or cleared by the [place] FDA and  has been authorized for detection and/or diagnosis of SARS-CoV-2 by FDA under an Emergency Use Authorization (EUA). This EUA will remain  in effect (meaning this test can be used) for the duration of the COVID-19 declaration under Se ction 564(b)(1) of the Act, 21 U.S.C. section 360bbb-3(b)(1), unless the authorization is terminated or revoked sooner.  Performed at [facility], [place] [address], [place], [place] [address]     RADIOLOGY STUDIES/RESULTS: DG Chest Port 1 View  Result Date:  06/11/2021 CLINICAL DATA:  Bilateral lower extremity weakness EXAM: PORTABLE CHEST 1 VIEW COMPARISON:  Chest x-ray dated October 24th 2021 FINDINGS: Normal cardiac contours. Tortuosity of the thoracic aorta. Elevation of the left hemidiaphragm. Unchanged left basilar reticular opacities, likely scarring. No new parenchymal process. Blunting of the left costophrenic angle, possibly due to pleural thickening versus trace pleural effusion, unchanged from prior. No large pleural effusion or pneumothorax. IMPRESSION: No active disease. Electronically Signed   By: Yetta Glassman M.D.   On: 06/11/2021 08:44     LOS: 0 days   Oren Binet, MD  [facility]    To contact the attending provider between 7A-7P or the covering provider during after hours 7P-7A, please log into the web site [email or web address] and access using universal [place] password for that web site. If you do not have the password, please call the hospital operator.  06/11/2021, 12:00 PM

## 2021-06-12 ENCOUNTER — Other Ambulatory Visit: Payer: Self-pay | Admitting: Family

## 2021-06-12 ENCOUNTER — Inpatient Hospital Stay (HOSPITAL_COMMUNITY): Payer: Medicare Other

## 2021-06-12 DIAGNOSIS — G8310 Monoplegia of lower limb affecting unspecified side: Secondary | ICD-10-CM

## 2021-06-12 DIAGNOSIS — Z66 Do not resuscitate: Secondary | ICD-10-CM

## 2021-06-12 LAB — BASIC METABOLIC PANEL
Anion gap: 5 (ref 5–15)
BUN: 13 mg/dL (ref 8–23)
CO2: 21 mmol/L — ABNORMAL LOW (ref 22–32)
Calcium: 9 mg/dL (ref 8.9–10.3)
Chloride: 104 mmol/L (ref 98–111)
Creatinine, Ser: 0.82 mg/dL (ref 0.61–1.24)
GFR, Estimated: >60 mL/min (ref >60)
Glucose, Bld: 102 mg/dL — ABNORMAL HIGH (ref 70–99)
Potassium: 3.9 mmol/L (ref 3.5–5.1)
Sodium: 130 mmol/L — ABNORMAL LOW (ref 135–145)

## 2021-06-12 LAB — CBC
HCT: 33.3 % — ABNORMAL LOW (ref 39.0–52.0)
Hemoglobin: 11.5 g/dL — ABNORMAL LOW (ref 13.0–17.0)
MCH: 33.9 pg (ref 26.0–34.0)
MCHC: 34.5 g/dL (ref 30.0–36.0)
MCV: 98.2 fL (ref 80.0–100.0)
Platelets: 134 10*3/uL — ABNORMAL LOW (ref 150–400)
RBC: 3.39 MIL/uL — ABNORMAL LOW (ref 4.22–5.81)
RDW: 13 % (ref 11.5–15.5)
WBC: 5.3 10*3/uL (ref 4.0–10.5)
nRBC: 0 % (ref 0.0–0.2)

## 2021-06-12 LAB — C4 COMPLEMENT: Complement C4, Body Fluid: 21 mg/dL (ref 12–38)

## 2021-06-12 LAB — CBC WITH DIFFERENTIAL/PLATELET
Abs Immature Granulocytes: 0.03 10*3/uL (ref 0.00–0.07)
Basophils Absolute: 0 10*3/uL (ref 0.0–0.1)
Basophils Relative: 0 %
Eosinophils Absolute: 0.2 10*3/uL (ref 0.0–0.5)
Eosinophils Relative: 3 %
HCT: 36.4 % — ABNORMAL LOW (ref 39.0–52.0)
Hemoglobin: 12.4 g/dL — ABNORMAL LOW (ref 13.0–17.0)
Immature Granulocytes: 1 %
Lymphocytes Relative: 17 %
Lymphs Abs: 0.9 10*3/uL (ref 0.7–4.0)
MCH: 33.6 pg (ref 26.0–34.0)
MCHC: 34.1 g/dL (ref 30.0–36.0)
MCV: 98.6 fL (ref 80.0–100.0)
Monocytes Absolute: 0.6 10*3/uL (ref 0.1–1.0)
Monocytes Relative: 10 %
Neutro Abs: 3.6 10*3/uL (ref 1.7–7.7)
Neutrophils Relative %: 69 %
Platelets: 150 10*3/uL (ref 150–400)
RBC: 3.69 MIL/uL — ABNORMAL LOW (ref 4.22–5.81)
RDW: 13.2 % (ref 11.5–15.5)
WBC: 5.3 10*3/uL (ref 4.0–10.5)
nRBC: 0 % (ref 0.0–0.2)

## 2021-06-12 LAB — COMPREHENSIVE METABOLIC PANEL
ALT: 27 U/L (ref 0–44)
AST: 26 U/L (ref 15–41)
Albumin: 3.2 g/dL — ABNORMAL LOW (ref 3.5–5.0)
Alkaline Phosphatase: 111 U/L (ref 38–126)
Anion gap: 4 — ABNORMAL LOW (ref 5–15)
BUN: 12 mg/dL (ref 8–23)
CO2: 23 mmol/L (ref 22–32)
Calcium: 9.1 mg/dL (ref 8.9–10.3)
Chloride: 103 mmol/L (ref 98–111)
Creatinine, Ser: 0.92 mg/dL (ref 0.61–1.24)
GFR, Estimated: >60 mL/min (ref >60)
Glucose, Bld: 99 mg/dL (ref 70–99)
Potassium: 4 mmol/L (ref 3.5–5.1)
Sodium: 130 mmol/L — ABNORMAL LOW (ref 135–145)
Total Bilirubin: 1.4 mg/dL — ABNORMAL HIGH (ref 0.3–1.2)
Total Protein: 6.3 g/dL — ABNORMAL LOW (ref 6.5–8.1)

## 2021-06-12 LAB — CSF CELL COUNT WITH DIFFERENTIAL
Lymphs, CSF: 89 % — ABNORMAL HIGH (ref 40–80)
Monocyte-Macrophage-Spinal Fluid: 11 % — ABNORMAL LOW (ref 15–45)
RBC Count, CSF: 25 /mm3 — ABNORMAL HIGH
Segmented Neutrophils-CSF: 0 % (ref 0–6)
Tube #: 1
WBC, CSF: 69 /mm3 (ref 0–5)

## 2021-06-12 LAB — C3 COMPLEMENT: C3 Complement: 107 mg/dL (ref 82–167)

## 2021-06-12 LAB — ANCA PROFILE
Anti-MPO Antibodies: <0.2 units (ref 0.0–0.9)
Anti-PR3 Antibodies: <0.2 units (ref 0.0–0.9)
Atypical P-ANCA titer: <1:20 {titer}
C-ANCA: <1:20 {titer}
P-ANCA: <1:20 {titer}

## 2021-06-12 LAB — LYME DISEASE SEROLOGY W/REFLEX: Lyme Total Antibody EIA: NEGATIVE

## 2021-06-12 LAB — HIV ANTIBODY (ROUTINE TESTING W REFLEX): HIV Screen 4th Generation wRfx: NONREACTIVE

## 2021-06-12 LAB — RHEUMATOID FACTOR: Rheumatoid fact SerPl-aCnc: <10 IU/mL (ref <14.0)

## 2021-06-12 LAB — PROTEIN AND GLUCOSE, CSF
Glucose, CSF: 55 mg/dL (ref 40–70)
Total  Protein, CSF: 75 mg/dL — ABNORMAL HIGH (ref 15–45)

## 2021-06-12 LAB — C1 ESTERASE INHIBITOR: C1INH SerPl-mCnc: 25 mg/dL (ref 21–39)

## 2021-06-12 LAB — ANA W/REFLEX IF POSITIVE: Anti Nuclear Antibody (ANA): NEGATIVE

## 2021-06-12 LAB — CRYPTOCOCCAL ANTIGEN, CSF: Crypto Ag: NEGATIVE

## 2021-06-12 LAB — SJOGRENS SYNDROME-B EXTRACTABLE NUCLEAR ANTIBODY: SSB (La) (ENA) Antibody, IgG: <0.2 AI (ref 0.0–0.9)

## 2021-06-12 LAB — ANTI-SMITH ANTIBODY: ENA SM Ab Ser-aCnc: <0.2 AI (ref 0.0–0.9)

## 2021-06-12 LAB — SJOGRENS SYNDROME-A EXTRACTABLE NUCLEAR ANTIBODY: SSA (Ro) (ENA) Antibody, IgG: <0.2 AI (ref 0.0–0.9)

## 2021-06-12 LAB — ANTI-SMOOTH MUSCLE ANTIBODY, IGG: F-Actin IgG: 19 Units (ref 0–19)

## 2021-06-12 LAB — ANTI-DNA ANTIBODY, DOUBLE-STRANDED: ds DNA Ab: <1 IU/mL (ref 0–9)

## 2021-06-12 MED ORDER — GADOBUTROL 1 MMOL/ML IV SOLN
6.6000 mL | Freq: Once | INTRAVENOUS | Status: AC | PRN
  Administered 2021-06-12: 6.6 mL via INTRAVENOUS

## 2021-06-12 MED ORDER — LORAZEPAM 1 MG PO TABS
1.0000 mg | ORAL_TABLET | Freq: Once | ORAL | Status: AC
  Administered 2021-06-12: 1 mg via ORAL
  Filled 2021-06-12: qty 1

## 2021-06-12 MED ORDER — APIXABAN 2.5 MG PO TABS
2.5000 mg | ORAL_TABLET | Freq: Two times a day (BID) | ORAL | Status: DC
  Administered 2021-06-12 – 2021-06-13 (×3): 2.5 mg via ORAL
  Filled 2021-06-12 (×3): qty 1

## 2021-06-12 MED ORDER — DEXTROSE 5 % IV SOLN
10.0000 mg/kg | Freq: Two times a day (BID) | INTRAVENOUS | Status: DC
  Administered 2021-06-12 – 2021-06-16 (×8): 660 mg via INTRAVENOUS
  Filled 2021-06-12 (×10): qty 13.2

## 2021-06-12 MED ORDER — LIDOCAINE HCL (PF) 1 % IJ SOLN
5.0000 mL | Freq: Once | INTRAMUSCULAR | Status: AC
  Administered 2021-06-12: 5 mL
  Filled 2021-06-12 (×2): qty 5

## 2021-06-12 NOTE — Progress Notes (Signed)
harmacy Antibiotic Note  [place] Odon[place]. is a 87 y.o. male admitted on 06/10/2021 with + 1 week history of LE purpuric rash, weakness.   Pharmacy has been consulted for Acyclovir dosing for herpes encephalitis.  CrCl 49.7 ml/min , in this 87 y.o male, weight 66.2 kg.   Plan: Acyclovir '10mg'$ /kg I V q12 hour = 660 mg IV q12hr.  Monitor clinical progress, renal function and cultures.   Follow up for other nephrotoxic medications      Temp (24hrs), Avg:97.8 F (36.6 C), Min:97.6 F (36.4 C), Max:97.9 F (36.6 C)  Recent Labs  Lab 06/10/21 1310 06/11/21 0143 06/12/21 0149 06/12/21 1455  WBC 5.7 5.9 5.3 5.3  CREATININE 0.97 0.86 0.82 0.92    Estimated Creatinine Clearance: 54 mL/min (by C-G formula based on SCr of 0.92 mg/dL).    No Known Allergies  Thank you for allowing pharmacy to be a part of this patient's care. Nicole Cella, RPh Clinical Pharmacist [phone] 06/12/2021 5:07 PM Please check AMION for all [place] phone numbers After 10:00 PM, call [place] [phone]

## 2021-06-12 NOTE — Procedures (Signed)
LUMBAR PUNCTURE (SPINAL TAP) PROCEDURE NOTE  Indication: paraparesis, suspected shingles rash in RLE   Proceduralists: Dr Zeb Comfort, Forrest Moron, NP  Time of procedure: 06/12/2021 1330   Risks of the procedure were dicussed with the patient including post-LP headache, bleeding, infection, weakness/numbness of legs(radiculopathy), death.    Consent obtained from: patient    Procedure Note The patient was prepped and draped, and using sterile technique a 20 gauge quinke spinal needle was inserted in the L4-5 space.   Approximately 12 cc of CSF were obtained and sent for analysis.  Patient tolerated the procedure well and blood loss was minimal.   Chaitanya Amedee Barbra Sarks

## 2021-06-12 NOTE — Progress Notes (Signed)
Inpatient Rehab Admissions Coordinator:   Note pt now inpatient status. I am recommending a CIR consult and will place one per our protocol.   Shann Medal, PT, DPT Admissions Coordinator [phone] 06/12/21  9:40 AM

## 2021-06-12 NOTE — Progress Notes (Signed)
PROGRESS NOTE        PATIENT DETAILS Name: [place] Odon[place]. Age: 87 y.o. Sex: male Date of Birth: 1932/01/17 Admit Date: 06/10/2021 Admitting Physician Evalee Mutton Kristeen Mans, MD [id number], Dwyane Luo, MD  Brief Narrative: Patient is a 87 y.o. male persistent atrial fibrillation on Eliquis, HFrEF, HTN who presented to the hospital with right leg weakness and a purpuric right leg rash.  Significant events: 8/14>> presented to ED with right leg weakness/pain-fall-found to have rash-thought to have shingles-started on Valtrex and discharged  8/23>> admit to [facility] for right leg weakness/purpuric right leg rash eval/management  Significant studies: 8/14>> CT head: No acute intracranial findings. 8/14>>X-ray right hip: Degenerative changes in the right hip joint. 8/14>> x-ray right knee: Degenerative changes without any acute bony abnormality. 8/24>> CXR: No pneumonia.  Antimicrobial therapy: None  Microbiology data: 8/23>> COVID PCR: Negative 8/23>> blood culture: No growth  Procedures : 8/24>> punch biopsy performed by general surgery  Consults: Neuro, ID, general surgery  DVT Prophylaxis :   Eliquis on hold  Subjective: Stable overnight-no major complaints.  Right leg weakness and rash appears unchanged.   Assessment/Plan: Right leg weakness/purpuric right lower extremity rash: Etiology unclear-broad differentials at this point-work-up in progress-skin/punch biopsy done yesterday-pending results.  Extensive autoimmune work-up sent by neurology as well-pending at this time.  Neurology has discontinued Eliquis in anticipation of lumbar puncture soon.  Per neurology-no need for CNS imaging at this point-if serological work-up negative-then can reconsider.  Will await further recommendations from neurology.    Hyponatremia: Improved this morning-saline lock all IV fluids-he appears euvolemic.  Follow electrolytes.  Persistent atrial fibrillation:  Continue Coreg-Eliquis on hold in anticipation of patient requiring a lumbar puncture.  Continue telemetry monitoring.  HFrEF: Volume status is stable-continue to hold diuretics for now.  History of CAD-s/p remote PCI: No anginal symptoms.  HLD: Continue statin  HTN: BP stable-continue losartan/Coreg-follow and adjust.  Diet: Diet Order             Diet regular Room service appropriate? Yes; Fluid consistency: Thin  Diet effective now                    Code Status:  DNR  Family Communication: Daughter-Andrea-[phone]-updated over the phone.  Disposition Plan: Status is: Inpatient  The patient will require care spanning > 2 midnights and should be moved to inpatient because: Inpatient level of care appropriate due to severity of illness  Dispo: The patient is from: Home              Anticipated d/c is to: SNF              Patient currently is not medically stable to d/c.   Difficult to place patient No     Barriers to Discharge: Right lower extremity weakness-rash of unknown etiology-work-up in progress  Antimicrobial agents: Anti-infectives (From admission, onward)    None        Time spent: 35 minutes-Greater than 50% of this time was spent in counseling, explanation of diagnosis, planning of further management, and coordination of care.  MEDICATIONS: Scheduled Meds:  atorvastatin  40 mg Oral QHS   carvedilol  3.125 mg Oral BID WC   docusate sodium  100 mg Oral BID   feeding supplement  237 mL Oral BID BM   lidocaine (PF)  5 mL  Other Once   losartan  50 mg Oral Daily   multivitamin with minerals  1 tablet Oral Daily   mupirocin cream   Topical BID   pregabalin  50 mg Oral BID   Continuous Infusions:  sodium chloride 50 mL/hr at 06/12/21 0945   PRN Meds:.acetaminophen **OR** acetaminophen, bisacodyl, hydrALAZINE, HYDROcodone-acetaminophen, morphine injection, ondansetron **OR** ondansetron (ZOFRAN) IV, polyethylene glycol, polyvinyl alcohol,  traMADol   PHYSICAL EXAM: Vital signs: Vitals:   06/12/21 0000 06/12/21 0333 06/12/21 0749 06/12/21 1232  BP: (!) 148/57 132/78 (!) 154/84 (!) 105/50  Pulse: 72 75 72 65  Resp: '17 20 19 16  '$ Temp: 97.8 F (36.6 C) 97.6 F (36.4 C) 97.9 F (36.6 C) 97.9 F (36.6 C)  TempSrc: Oral Oral Oral Oral  SpO2: 91% 99% 99% 100%   There were no vitals filed for this visit. There is no height or weight on file to calculate BMI.   Gen Exam:Alert awake-not in any distress HEENT:atraumatic, normocephalic Chest: B/L clear to auscultation anteriorly CVS:S1S2 regular Abdomen:soft non tender, non distended Extremities:no edema Neurology: RLE weakness-around 3+-4/5 Skin: Purpuric rash to RLE appears unchanged.  I have personally reviewed following labs and imaging studies  LABORATORY DATA: CBC: Recent Labs  Lab 06/10/21 1310 06/11/21 0143 06/12/21 0149  WBC 5.7 5.9 5.3  NEUTROABS 4.0  --   --   HGB 13.7 13.0 11.5*  HCT 39.6 36.9* 33.3*  MCV 98.0 96.9 98.2  PLT 170 168 134*     Basic Metabolic Panel: Recent Labs  Lab 06/10/21 1310 06/11/21 0143 06/12/21 0149  NA 125* 125* 130*  K 4.2 3.8 3.9  CL 95* 97* 104  CO2 23 21* 21*  GLUCOSE 109* 116* 102*  BUN '14 13 13  '$ CREATININE 0.97 0.86 0.82  CALCIUM 9.6 9.2 9.0     GFR: Estimated Creatinine Clearance: 60.5 mL/min (by C-G formula based on SCr of 0.82 mg/dL).  Liver Function Tests: Recent Labs  Lab 06/10/21 1310  AST 26  ALT 29  ALKPHOS 111  BILITOT 2.3*  PROT 7.1  ALBUMIN 3.8    No results for input(s): LIPASE, AMYLASE in the last 168 hours. No results for input(s): AMMONIA in the last 168 hours.  Coagulation Profile: No results for input(s): INR, PROTIME in the last 168 hours.  Cardiac Enzymes: Recent Labs  Lab 06/11/21 1406  CKTOTAL 62    BNP (last 3 results) No results for input(s): PROBNP in the last 8760 hours.  Lipid Profile: No results for input(s): CHOL, HDL, LDLCALC, TRIG, CHOLHDL,  LDLDIRECT in the last 72 hours.  Thyroid Function Tests: Recent Labs    06/10/21 1310  TSH 1.875     Anemia Panel: No results for input(s): VITAMINB12, FOLATE, FERRITIN, TIBC, IRON, RETICCTPCT in the last 72 hours.  Urine analysis:    Component Value Date/Time   COLORURINE YELLOW 06/10/2021 2100   APPEARANCEUR HAZY (A) 06/10/2021 2100   LABSPEC 1.008 06/10/2021 2100   PHURINE 7.0 06/10/2021 2100   GLUCOSEU NEGATIVE 06/10/2021 2100   HGBUR NEGATIVE 06/10/2021 2100   [place] NEGATIVE 06/10/2021 2100   KETONESUR NEGATIVE 06/10/2021 2100   PROTEINUR NEGATIVE 06/10/2021 2100   UROBILINOGEN 0.2 06/13/2012 1027   NITRITE NEGATIVE 06/10/2021 2100   LEUKOCYTESUR MODERATE (A) 06/10/2021 2100    Sepsis Labs: Lactic Acid, Venous No results found for: LATICACIDVEN  MICROBIOLOGY: Recent Results (from the past 240 hour(s))  Culture, blood (routine x 2)     Status: None (Preliminary result)   Collection  Time: 06/10/21 12:55 PM   Specimen: BLOOD  Result Value Ref Range Status   Specimen Description BLOOD BLOOD RIGHT FOREARM  Final   Special Requests   Final    BOTTLES DRAWN AEROBIC AND ANAEROBIC Blood Culture adequate volume   Culture   Final    NO GROWTH 2 DAYS Performed at [facility], [address], [place], [place] [address]    Report Status PENDING  Incomplete  Culture, blood (routine x 2)     Status: None (Preliminary result)   Collection Time: 06/10/21  1:10 PM   Specimen: BLOOD  Result Value Ref Range Status   Specimen Description BLOOD RIGHT ANTECUBITAL  Final   Special Requests   Final    BOTTLES DRAWN AEROBIC AND ANAEROBIC Blood Culture adequate volume   Culture   Final    NO GROWTH 2 DAYS Performed at [facility], [place] [address], [place], [place] [address]    Report Status PENDING  Incomplete  SARS CORONAVIRUS 2 (TAT 6-24 HRS) Nasopharyngeal Nasopharyngeal Swab     Status: None   Collection Time: 06/10/21  7:11 PM   Specimen: Nasopharyngeal  Swab  Result Value Ref Range Status   SARS Coronavirus 2 NEGATIVE NEGATIVE Final    Comment: (NOTE) SARS-CoV-2 target nucleic acids are NOT DETECTED.  The SARS-CoV-2 RNA is generally detectable in upper and lower respiratory specimens during the acute phase of infection. Negative results do not preclude SARS-CoV-2 infection, do not rule out co-infections with other pathogens, and should not be used as the sole basis for treatment or other patient management decisions. Negative results must be combined with clinical observations, patient history, and epidemiological information. The expected result is Negative.  Fact Sheet for Patients: SugarRoll.be  Fact Sheet for Healthcare Providers: https://www.woods-mathews.com/  This test is not yet approved or cleared by the [place] FDA and  has been authorized for detection and/or diagnosis of SARS-CoV-2 by FDA under an Emergency Use Authorization (EUA). This EUA will remain  in effect (meaning this test can be used) for the duration of the COVID-19 declaration under Se ction 564(b)(1) of the Act, 21 U.S.C. section 360bbb-3(b)(1), unless the authorization is terminated or revoked sooner.  Performed at [facility], [place] [address] [place], [place] [address]   Aerobic/Anaerobic Culture w Gram Stain (surgical/deep wound)     Status: None (Preliminary result)   Collection Time: 06/11/21 11:23 AM   Specimen: Tissue  Result Value Ref Range Status   Specimen Description TISSUE  Final   Special Requests SKIN EXCISIONAL BIOPSY  Final   Gram Stain   Final    RARE WBC PRESENT,BOTH PMN AND MONONUCLEAR NO ORGANISMS SEEN    Culture   Final    NO GROWTH < 24 HOURS Performed at [facility], [place] [address] [place], [place] [address]    Report Status PENDING  Incomplete    RADIOLOGY STUDIES/RESULTS: DG Chest Port 1 View  Result Date: 06/11/2021 CLINICAL DATA:  Bilateral lower  extremity weakness EXAM: PORTABLE CHEST 1 VIEW COMPARISON:  Chest x-ray dated October 24th 2021 FINDINGS: Normal cardiac contours. Tortuosity of the thoracic aorta. Elevation of the left hemidiaphragm. Unchanged left basilar reticular opacities, likely scarring. No new parenchymal process. Blunting of the left costophrenic angle, possibly due to pleural thickening versus trace pleural effusion, unchanged from prior. No large pleural effusion or pneumothorax. IMPRESSION: No active disease. Electronically Signed   By: Yetta Glassman M.D.   On: 06/11/2021  08:44     LOS: 1 day   Oren Binet, MD  [facility]    To contact the attending provider between 7A-7P or the covering provider during after hours 7P-7A, please log into the web site [email or web address] and access using universal [place] password for that web site. If you do not have the password, please call the hospital operator.  06/12/2021, 12:42 PM

## 2021-06-12 NOTE — Progress Notes (Signed)
Subjective: No acute events overnight.  Continues to have right more than left lower extremity weakness.  States the pain in both his feet is improved. Denies any other concerns.  ROS: Unable to obtain due to poor mental status  Examination  Vital signs in last 24 hours: Temp:  [97.6 F (36.4 C)-98.4 F (36.9 C)] 97.9 F (36.6 C) (08/25 1232) Pulse Rate:  [49-75] 65 (08/25 1232) Resp:  [16-20] 16 (08/25 1232) BP: (105-154)/(50-86) 105/50 (08/25 1232) SpO2:  [91 %-100 %] 100 % (08/25 1232)  General: lying in bed, NAD CVS: pulse-normal rate and rhythm RS: breathing comfortably, symmetric expansion Extremities: Right lower extremity purpuric rash, no edema Neuro: AOx3, cranial nerves are all grossly intact, 5/5 in bilateral upper extremities, 4 -/5 in left lower extremity, 3/5 in right lower extremity, 2+ biceps, 1+ knee jerk bilaterally, allodynia in bilateral feet and right shin, no myalgias.  Basic Metabolic Panel: Recent Labs  Lab 06/10/21 1310 06/11/21 0143 06/12/21 0149  NA 125* 125* 130*  K 4.2 3.8 3.9  CL 95* 97* 104  CO2 23 21* 21*  GLUCOSE 109* 116* 102*  BUN '14 13 13  '$ CREATININE 0.97 0.86 0.82  CALCIUM 9.6 9.2 9.0    CBC: Recent Labs  Lab 06/10/21 1310 06/11/21 0143 06/12/21 0149  WBC 5.7 5.9 5.3  NEUTROABS 4.0  --   --   HGB 13.7 13.0 11.5*  HCT 39.6 36.9* 33.3*  MCV 98.0 96.9 98.2  PLT 170 168 134*     Coagulation Studies: No results for input(s): LABPROT, INR in the last 72 hours.  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   Bilateral lower extremity paresis Right lower extremity rash Hyponatremia -Differentials include vasculitic neuropathy versus paraneoplastic disease versus due to  medication (Eliquis) versus postherpetic neuralgia (although low suspicion as the distribution does not appear typical for shingles) -Negative work-up so far: HIV, hepatitis panel, Lyme disease neurology, CK, C3 and C4 complement, C1 esterase inhibitor, ANA, anti-DNA, rheumatoid factor, Sjogren's syndrome, anti-smooth antibody.    Recommendations: -Lumbar puncture performed, will resume Eliquis this evening. -CSF cell count, protein, glucose, IgG, oligoclonal bands, HSV PCR, VZV PCR, cryptococcal antigen ordered and pending.  If negative, can consider sending paraneoplastic panel to [facility] -Although low suspicion for central etiology, we will order an MRI brain with and without contrast, MRA brain with contrast, MRI T and L-spine with and without contrast to look for any acute abnormality.  Of note, patient reported feeling claustrophobic.  Will order PO Ativan to be administered prior to MRI -Continue pregabalin 50 mg twice daily for allodynia -If CSF and MRI is negative, can consider IV Solu-Medrol 1000 mg daily. -Management of rest of comorbidities per primary team   Thank you for allowing [place] to participate in the care of this patient. If you have any further questions, please contact  me or neurohospitalist.    I have spent a total of 45 minutes with the patient reviewing hospital notes,  test results, labs and examining the patient as well as establishing an assessment and plan that was discussed personally with the patient, RN and Dr Sloan Leiter.  > 50% of time was spent in direct patient care.   Zeb Comfort Epilepsy [facility] For questions after 5pm please  refer to AMION to reach the Neurologist on call

## 2021-06-12 NOTE — TOC Initial Note (Signed)
Transition of Care (TOC) - Initial/Assessment Note    Patient Details  Name: Parker Odonnell. MRN: [id number] Date of Birth: November 05, 1931  Transition of Care [facility]) CM/SW Contact:    Benard Halsted, LCSW Phone Number: 06/12/2021, 1:34 PM  Clinical Narrative:                 CSW received consult for possible SNF placement at time of discharge. CSW spoke with patient. Patient reported that he lives alone and does not have assistance at home. Patient expressed understanding of PT recommendation and is agreeable to SNF placement at time of discharge but will talk it over with his son and daughter. CSW discussed insurance authorization process and will provide Medicare SNF ratings list. Patient has received 4 COVID vaccines. Patient expressed being hopeful for rehab and to feel better soon. No further questions reported at this time and he provided permission for CSW to contact his daughter [name].   Skilled Nursing Rehab Facilities-   [email or web address] Ratings out of 5 possible    Name Address  Phone # [place] Inspection Overall  [facility] [address] [place] '5 1 4 4  '$ [facility]  [address], [place] [phone] '3 1 5 4  '$ [facility] [place], [place] '3 1 1 1  '$ [place], [place] '2 2 4 4  '$ [facility] [address] [place] '3 1 2 1  '$ [place] '3 2 4 4  '$ [facility] [address] [place] '5 1 2 2  '$ [facility] [address], [place] '5 2 2 3  '$ [facility] [place] '5 1 2 2  '$ [facility] [phone], [phone] '5 1 2 2  '$ [facility] [address] [facility] [phone] '5 1 2 2  '$ [facility] [address] '3 1 1 1       '$ Barriers to Discharge: Continued Medical Work up, [place]   Patient Goals and CMS Choice Patient states their goals for this hospitalization and ongoing recovery are:: Rehab CMS Medicare.gov Compare Post Acute Care list provided to:: Patient Choice offered to / list presented to : Patient  Expected Discharge Plan and Services   In-house Referral: Clinical Social Work   Post Acute Care Choice: IP Rehab, [facility] Living arrangements for the past 2 months: [place]                                      Prior Living Arrangements/Services Living arrangements for the past 2 months: [place] Lives with:: Self Patient language and need for interpreter reviewed:: Yes Do you feel safe going back to the place where you live?: Yes      Need for Family Participation in Patient Care: Yes (Comment) Care giver support system in place?: Yes (comment)   Criminal Activity/Legal Involvement Pertinent to Current Situation/Hospitalization: No - Comment as needed  Activities of Daily Living Home Assistive Devices/Equipment: Cane (specify quad or straight) ADL Screening (condition at time of admission) Patient's cognitive ability adequate to safely complete daily activities?: Yes Is the patient deaf or have difficulty hearing?: No Does the patient have difficulty seeing, even when wearing glasses/contacts?: No Does  the patient have difficulty concentrating, remembering, or making decisions?: Yes Patient able to express need for assistance with ADLs?: Yes Does the patient have difficulty dressing or bathing?: Yes Independently performs ADLs?: No Communication: Independent Dressing (OT): Needs assistance Is this a change from baseline?: Change from baseline, expected to last >3 days Grooming: Independent Feeding: Independent Bathing: Dependent Is this a change from baseline?:  Change from baseline, expected to last >3 days Toileting: Needs assistance Is this a change from baseline?: Change from baseline, expected to last >3days In/Out Bed: Needs assistance Is this a change from baseline?: Change from baseline, expected to last >3 days Walks in Home: Needs assistance Is this a change from baseline?: Change from baseline, expected to last >3 days Does the patient have difficulty walking or climbing stairs?: Yes Weakness of Legs: Both Weakness of Arms/Hands: Both  Permission Sought/Granted Permission sought to share information with : Facility Sport and exercise psychologist, Family Supports Permission granted to share information with : Yes, Verbal Permission Granted  Share Information with NAME: [name]  Permission granted to share info w AGENCY: SNFs  Permission granted to share info w Relationship: Daughter  Permission granted to share info w Contact Information: [phone]  Emotional Assessment Appearance:: Appears stated age Attitude/Demeanor/Rapport: Engaged Affect (typically observed): Accepting, Appropriate Orientation: : Oriented to Self, Oriented to Place, Oriented to  Time, Oriented to Situation Alcohol / Substance Use: Not Applicable Psych Involvement: No (comment)  Admission diagnosis:  Rash [R21] Hyponatremia [E87.1] Vasculitis ([place]) [I77.6] Weakness [R53.1] Patient Active Problem List   Diagnosis Date Noted   Rash 06/10/2021   Weakness 06/10/2021   Chronic combined systolic (congestive) and diastolic (congestive) heart failure ([place]) 06/10/2021   DNR (do not resuscitate) 06/10/2021   OA (osteoarthritis) of hip 01/10/2020   Primary osteoarthritis of left hip 01/10/2020   Preoperative cardiovascular examination    Shortness of breath    Bilateral pleural effusion    Thrombocytopenia ([place]) 06/03/2018   Atrial flutter ([place]) 06/03/2018   Acute on chronic systolic CHF (congestive heart failure) ([place]) 06/03/2018   Bilateral edema of lower  extremity    Essential hypertension 09/19/2015   LBBB (left bundle branch block) 09/19/2015   Hypokalemia 08/27/2014   ASCVD (arteriosclerotic cardiovascular disease)    Dyslipidemia    ED (erectile dysfunction)    Left bundle branch block    Postsurgical percutaneous transluminal coronary angioplasty status    Coronary atherosclerosis of native coronary artery    Bilateral inguinal hernia 06/15/2012   PCP:  Lawerance Cruel, MD Pharmacy:   [id number] [place] [id number]: [phone]: [phone] [facility] [place] [address] [id number]: [phone]: [phone]    Social Determinants of Health (SDOH) Interventions    Readmission Risk Interventions No flowsheet data found.

## 2021-06-12 NOTE — Plan of Care (Signed)
  Problem: Education: Goal: Knowledge of General Education information will improve Description Including pain rating scale, medication(s)/side effects and non-pharmacologic comfort measures Outcome: Progressing   Problem: Health Behavior/Discharge Planning: Goal: Ability to manage health-related needs will improve Outcome: Progressing   

## 2021-06-12 NOTE — Progress Notes (Signed)
Inpatient Rehab Admissions Coordinator:   Met with patient at bedside to discuss CIR recommendation.  I explained average length of stay about 2 weeks, dependent upon progress, and typical need for some support from family at discharge.  Pt states typically he lives alone, but has a son and a daughter that could provide assist at discharge if necessary.  We discussed that workup is just beginning and we will need a clearer diagnosis and treatment plan before pursuing insurance authorization and potential CIR admission.  He verbalizes understanding.  I let him know that I would continue to follow workup and I would reach out to family in a few days, when he may be closer to being ready to discuss support available.   Shann Medal, PT, DPT Admissions Coordinator [phone] 06/12/21  12:56 PM

## 2021-06-12 NOTE — NC FL2 (Signed)
[place] LEVEL OF CARE SCREENING TOOL     IDENTIFICATION  Patient Name: Parker Odonnell. Birthdate: Jan 09, 1932 Sex: male Admission Date (Current Location): 06/10/2021  [facility] and [place] Number:  Herbalist and Address:  [facility], [place] [address], [place], [place] [address]      Provider Number: [id number]  Attending Physician Name and Address:  Jonetta Osgood, MD  Relative Name and Phone Number:       Current Level of Care: Hospital Recommended Level of Care: [place] Prior Approval Number:    Date Approved/Denied:   PASRR Number: [id number]  Discharge Plan: SNF    Current Diagnoses: Patient Active Problem List   Diagnosis Date Noted   Rash 06/10/2021   Weakness 06/10/2021   Chronic combined systolic (congestive) and diastolic (congestive) heart failure ([place]) 06/10/2021   DNR (do not resuscitate) 06/10/2021   OA (osteoarthritis) of hip 01/10/2020   Primary osteoarthritis of left hip 01/10/2020   Preoperative cardiovascular examination    Shortness of breath    Bilateral pleural effusion    Thrombocytopenia ([place]) 06/03/2018   Atrial flutter ([place]) 06/03/2018   Acute on chronic systolic CHF (congestive heart failure) ([place]) 06/03/2018   Bilateral edema of lower extremity    Essential hypertension 09/19/2015   LBBB (left bundle branch block) 09/19/2015   Hypokalemia 08/27/2014   ASCVD (arteriosclerotic cardiovascular disease)    Dyslipidemia    ED (erectile dysfunction)    Left bundle branch block    Postsurgical percutaneous transluminal coronary angioplasty status    Coronary atherosclerosis of native coronary artery    Bilateral inguinal hernia 06/15/2012    Orientation RESPIRATION BLADDER Height & Weight     Self, Time, Situation, Place  Normal Incontinent, External catheter Weight:   Height:     BEHAVIORAL SYMPTOMS/MOOD NEUROLOGICAL BOWEL NUTRITION STATUS      Incontinent Diet  (see dc Summary)  AMBULATORY STATUS COMMUNICATION OF NEEDS Skin   Limited Assist Verbally Normal                       Personal Care Assistance Level of Assistance  Bathing, Dressing, Feeding Bathing Assistance: Maximum assistance Feeding assistance: Limited assistance Dressing Assistance: Limited assistance     Functional Limitations Info             SPECIAL CARE FACTORS FREQUENCY  PT (By licensed PT), OT (By licensed OT)     PT Frequency: 5x/week OT Frequency: 5x/week            Contractures Contractures Info: Not present    Additional Factors Info  Code Status, Allergies Code Status Info: DNR Allergies Info: NKA           Current Medications (06/12/2021):  This is the current hospital active medication list Current Facility-Administered Medications  Medication Dose Route Frequency Provider Last Rate Last Admin   acetaminophen (TYLENOL) tablet 650 mg  650 mg Oral Q6H PRN Karmen Bongo, MD       Or   acetaminophen (TYLENOL) suppository 650 mg  650 mg Rectal Q6H PRN Karmen Bongo, MD       atorvastatin (LIPITOR) tablet 40 mg  40 mg Oral Ivery Quale, MD   40 mg at 06/11/21 2000   bisacodyl (DULCOLAX) EC tablet 5 mg  5 mg Oral Daily PRN Karmen Bongo, MD       carvedilol (COREG) tablet 3.125 mg  3.125 mg Oral BID WC  Karmen Bongo, MD   3.125 mg at 06/12/21 [id number]   docusate sodium (COLACE) capsule 100 mg  100 mg Oral BID Karmen Bongo, MD   100 mg at 06/10/21 2137   feeding supplement (ENSURE ENLIVE / ENSURE PLUS) liquid 237 mL  237 mL Oral BID BM Jonetta Osgood, MD   237 mL at 06/11/21 1430   hydrALAZINE (APRESOLINE) injection 5 mg  5 mg Intravenous Q4H PRN Karmen Bongo, MD       HYDROcodone-acetaminophen (NORCO/VICODIN) 5-325 MG per tablet 1-2 tablet  1-2 tablet Oral Q4H PRN Karmen Bongo, MD       lidocaine (PF) (XYLOCAINE) 1 % injection 5 mL  5 mL Other Once Lora Havens, MD       losartan (COZAAR) tablet 50 mg  50 mg Oral  Daily Karmen Bongo, MD   50 mg at 06/12/21 0948   morphine 2 MG/ML injection 2 mg  2 mg Intravenous Q2H PRN Karmen Bongo, MD       multivitamin with minerals tablet 1 tablet  1 tablet Oral Daily Jonetta Osgood, MD   1 tablet at 06/12/21 [id number]   mupirocin cream (BACTROBAN) 2 %   Topical BID Karmen Bongo, MD   Given at 06/12/21 0956   ondansetron [facility]) tablet 4 mg  4 mg Oral Q6H PRN Karmen Bongo, MD       Or   ondansetron [facility]) injection 4 mg  4 mg Intravenous Q6H PRN Karmen Bongo, MD       polyethylene glycol (MIRALAX / GLYCOLAX) packet 17 g  17 g Oral Daily PRN Karmen Bongo, MD       polyvinyl alcohol (LIQUIFILM TEARS) 1.4 % ophthalmic solution 1 drop  1 drop Both Eyes BID PRN Heloise Purpura, RPH       pregabalin (LYRICA) capsule 50 mg  50 mg Oral BID Lora Havens, MD   50 mg at 06/12/21 0948   traMADol (ULTRAM) tablet 50 mg  50 mg Oral Q6H PRN Karmen Bongo, MD         Discharge Medications: Please see discharge summary for a list of discharge medications.  Relevant Imaging Results:  Relevant Lab Results:   Additional Information SSN: [id number]. Moderna vaccines 11/20/19, 12/18/19, 08/26/20, 05/17/21  Benard Halsted, LCSW

## 2021-06-12 NOTE — Progress Notes (Signed)
PT Cancellation Note  Patient Details Name: Parker Odonnell. MRN: [id number] DOB: Apr 09, 1932   Cancelled Treatment:    Reason Eval/Treat Not Completed: Other (comment).  Pt had LP and did not have chart instructions for length of time to remain at rest in bed.  Follow up tomorrow to resume walking.   Ramond Dial 06/12/2021, 3:47 PM  Mee Hives, PT MS Acute Rehab Dept. Number: [place]

## 2021-06-13 DIAGNOSIS — R21 Rash and other nonspecific skin eruption: Secondary | ICD-10-CM | POA: Diagnosis not present

## 2021-06-13 DIAGNOSIS — I776 Arteritis, unspecified: Secondary | ICD-10-CM | POA: Diagnosis not present

## 2021-06-13 DIAGNOSIS — R531 Weakness: Secondary | ICD-10-CM | POA: Diagnosis not present

## 2021-06-13 LAB — COMPREHENSIVE METABOLIC PANEL
ALT: 24 U/L (ref 0–44)
AST: 23 U/L (ref 15–41)
Albumin: 3 g/dL — ABNORMAL LOW (ref 3.5–5.0)
Alkaline Phosphatase: 99 U/L (ref 38–126)
Anion gap: 4 — ABNORMAL LOW (ref 5–15)
BUN: 13 mg/dL (ref 8–23)
CO2: 22 mmol/L (ref 22–32)
Calcium: 8.8 mg/dL — ABNORMAL LOW (ref 8.9–10.3)
Chloride: 102 mmol/L (ref 98–111)
Creatinine, Ser: 0.82 mg/dL (ref 0.61–1.24)
GFR, Estimated: >60 mL/min (ref >60)
Glucose, Bld: 119 mg/dL — ABNORMAL HIGH (ref 70–99)
Potassium: 3.9 mmol/L (ref 3.5–5.1)
Sodium: 128 mmol/L — ABNORMAL LOW (ref 135–145)
Total Bilirubin: 1.9 mg/dL — ABNORMAL HIGH (ref 0.3–1.2)
Total Protein: 5.5 g/dL — ABNORMAL LOW (ref 6.5–8.1)

## 2021-06-13 LAB — CSF CELL COUNT WITH DIFFERENTIAL
Lymphs, CSF: 83 % — ABNORMAL HIGH (ref 40–80)
Monocyte-Macrophage-Spinal Fluid: 17 % (ref 15–45)
RBC Count, CSF: 1 /mm3 — ABNORMAL HIGH
Segmented Neutrophils-CSF: 0 % (ref 0–6)
WBC, CSF: 66 /mm3 (ref 0–5)

## 2021-06-13 LAB — PROTEIN ELECTROPHORESIS, SERUM
A/G Ratio: 1.3 (ref 0.7–1.7)
Albumin ELP: 3.2 g/dL (ref 2.9–4.4)
Alpha-1-Globulin: 0.2 g/dL (ref 0.0–0.4)
Alpha-2-Globulin: 0.7 g/dL (ref 0.4–1.0)
Beta Globulin: 0.7 g/dL (ref 0.7–1.3)
Gamma Globulin: 1 g/dL (ref 0.4–1.8)
Globulin, Total: 2.5 g/dL (ref 2.2–3.9)
Total Protein ELP: 5.7 g/dL — ABNORMAL LOW (ref 6.0–8.5)

## 2021-06-13 LAB — CARDIOLIPIN ANTIBODIES, IGG, IGM, IGA
Anticardiolipin IgA: <9 APL U/mL (ref 0–11)
Anticardiolipin IgG: <9 GPL U/mL (ref 0–14)
Anticardiolipin IgM: <9 MPL U/mL (ref 0–12)

## 2021-06-13 LAB — COMPLEMENT, TOTAL: Compl, Total (CH50): 50 U/mL (ref >41)

## 2021-06-13 LAB — CBC
HCT: 33.3 % — ABNORMAL LOW (ref 39.0–52.0)
Hemoglobin: 11.3 g/dL — ABNORMAL LOW (ref 13.0–17.0)
MCH: 33.8 pg (ref 26.0–34.0)
MCHC: 33.9 g/dL (ref 30.0–36.0)
MCV: 99.7 fL (ref 80.0–100.0)
Platelets: 148 10*3/uL — ABNORMAL LOW (ref 150–400)
RBC: 3.34 MIL/uL — ABNORMAL LOW (ref 4.22–5.81)
RDW: 13.3 % (ref 11.5–15.5)
WBC: 6 10*3/uL (ref 4.0–10.5)
nRBC: 0 % (ref 0.0–0.2)

## 2021-06-13 LAB — RPR: RPR Ser Ql: NONREACTIVE

## 2021-06-13 LAB — PATHOLOGIST SMEAR REVIEW: Path Review: INCREASED

## 2021-06-13 LAB — CYTOLOGY - NON PAP

## 2021-06-13 NOTE — Progress Notes (Signed)
Neurology Progress Note  Brief HPI: 87 y.o. 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 Previous ED evaluation on 8/14 patient was diagnosed with possible shingles for a rash on the RLE and started on valacyclovir. His weakness continued to worsen with bilateral R > L lower extremity weakness and neurology was consulted for further evaluation of weakness.   Subjective: No acute overnight events  Continues to have right > left lower extremity weakness affecting ambulation   Exam: Vitals:   06/13/21 0402 06/13/21 0816  BP: (!) 155/71 136/78  Pulse: 72 89  Resp: 13 17  Temp: 97.9 F (36.6 C) 98 F (36.7 C)  SpO2: 97% 90%   Gen: Sitting up in bedside recliner, in no acute distress Resp: non-labored breathing, no respiratory distress on room air Abd: soft, non-tender, non-distended  Neuro: Mental Status: Awake, alert, and oriented to person, place, time, and situation He is able to provide a clear and coherent history of present illness Speech is intact without dysarthria.  No aphasia or neglect noted.  Cranial Nerves: PERRL, EOMI without ptosis, facial sensation intact and symmetric to light touch, face is symmetric resting and smiling, hearing is intact to voice, shoulders shrug symmetrically, phonation is normal, palate rises symmetrically, tongue protrudes midline.  Motor:  5/5 strength in bilateral upper extremities, 3/5 strength right lower extremity, 4-/5 strength left lower extremity  Sensory: sensation to light touch intact and symmetric in bilateral upper and lower extremities; allodynia in bilateral feet DTR: 2+ and symmetric biceps, 1+ and symmetric patellae Gait: Deferred for patient safety   On MD exam patient was sleeping quite soundly and not  disturbed  Pertinent Labs: CBC    Component Value Date/Time   WBC 6.0 06/13/2021 0224   RBC 3.34 (L) 06/13/2021 0224   HGB 11.3 (L) 06/13/2021 0224   HGB 13.0 11/10/2019 1246   HCT 33.3 (L) 06/13/2021 0224   HCT 38.9 11/10/2019 1246   PLT 148 (L) 06/13/2021 0224   PLT 142 (L) 11/10/2019 1246   MCV 99.7 06/13/2021 0224   MCV 102 (H) 11/10/2019 1246   MCH 33.8 06/13/2021 0224   MCHC 33.9 06/13/2021 0224   RDW 13.3 06/13/2021 0224   RDW 12.1 11/10/2019 1246   LYMPHSABS 0.9 06/12/2021 1455   LYMPHSABS 1.2 12/23/2016 0804   MONOABS 0.6 06/12/2021 1455   EOSABS 0.2 06/12/2021 1455   EOSABS 0.4 12/23/2016 0804   BASOSABS 0.0 06/12/2021 1455   BASOSABS 0.0 12/23/2016 0804   CMP     Component Value Date/Time   NA 130 (L) 06/12/2021 1455   NA 139 11/10/2019 1246   K 4.0 06/12/2021 1455   CL 103 06/12/2021 1455   CO2 23 06/12/2021 1455   GLUCOSE 99 06/12/2021 1455   BUN 12 06/12/2021 1455   BUN 12 11/10/2019 1246   CREATININE 0.92 06/12/2021 1455   CREATININE 0.96 12/19/2015 0825   CALCIUM 9.1 06/12/2021 1455   PROT 6.3 (L) 06/12/2021 1455   PROT 6.5 12/23/2016 0804   ALBUMIN 3.2 (L) 06/12/2021 1455   ALBUMIN 4.1 12/23/2016 0804   AST 26 06/12/2021 1455   ALT 27 06/12/2021 1455   ALKPHOS 111 06/12/2021 1455   BILITOT 1.4 (H) 06/12/2021 1455   BILITOT 1.5 (H) 12/23/2016 0804   GFRNONAA >60 06/12/2021 1455  GFRAA >60 01/12/2020 0117   HIV 06/12/2021: Non Reactive Lyme disease serology: Negative  Results for CARA, PAYMENT (MRN [id number]) as of 06/13/2021 09:15  Ref. Range 06/12/2021 13:34 06/12/2021 13:35  Appearance, CSF Latest Ref Range: CLEAR  CLEAR (A)   Glucose, CSF Latest Ref Range: 40 - 70 mg/dL  55  RBC Count, CSF Latest Ref Range: 0 /cu mm 25 (H)   WBC, CSF Latest Ref Range: 0 - 5 /cu mm 69 (HH)   Segmented Neutrophils-CSF Latest Ref Range: 0 - 6 % 0   Lymphs, CSF Latest Ref Range: 40 - 80 % 89 (H)   Monocyte-Macrophage-Spinal Fluid Latest Ref Range: 15 -  45 % 11 (L)   Color, CSF Latest Ref Range: COLORLESS  COLORLESS   Supernatant Unknown NOT INDICATED   Total  Protein, CSF Latest Ref Range: 15 - 45 mg/dL  75 (H)  Tube # Unknown 1    Results for ANGELUS, MASA (MRN [id number]) as of 06/13/2021 09:15  Ref. Range 06/11/2021 14:06  Anti Nuclear Antibody (ANA) Latest Ref Range: Negative  Negative  ds DNA Ab Latest Ref Range: 0 - 9 IU/mL <1  RA Latex Turbid. Latest Ref Range: <14.0 IU/mL <10.0  Cytoplasmic (C-ANCA) Latest Ref Range: Neg:<1:20 titer <1:20  P-ANCA Latest Ref Range: Neg:<1:20 titer <1:20  Atypical P-ANCA titer Latest Ref Range: Neg:<1:20 titer <1:20  ENA SM Ab Ser-aCnc Latest Ref Range: 0.0 - 0.9 AI <0.2  Anti-MPO Antibodies Latest Ref Range: 0.0 - 0.9 units <0.2  Anti-PR3 Antibodies Latest Ref Range: 0.0 - 0.9 units <0.2  C1INH SerPl-mCnc Latest Ref Range: 21 - 39 mg/dL 25  C3 Complement Latest Ref Range: 82 - 167 mg/dL 107  Compl, Total (CH50) Latest Ref Range: >41 U/mL 50  Complement C4, Body Fluid Latest Ref Range: 12 - 38 mg/dL 21  Lyme Total Antibody EIA Latest Ref Range: Negative  Negative  SSA (Ro) (ENA) Antibody, IgG Latest Ref Range: 0.0 - 0.9 AI <0.2  SSB (La) (ENA) Antibody, IgG Latest Ref Range: 0.0 - 0.9 AI <0.2  Hep A Ab, IgM Latest Ref Range: NON REACTIVE  NON REACTIVE  Hepatitis B Surface Ag Latest Ref Range: NON REACTIVE  NON REACTIVE  Hep B Core Ab, IgM Latest Ref Range: NON REACTIVE  NON REACTIVE  HCV Ab Latest Ref Range: NON REACTIVE  NON REACTIVE  HIV Screen 4th Generation wRfx Latest Ref Range: Non Reactive  Non Reactive   CSF culture pending CSF gram stain: no organisms seen, WBC present, predominantly mononuclear Cryptococcal antigen CSF negative CSF cytology -- negative for malignant cells  Lab Results  Component Value Date   ESRSEDRATE 15 06/10/2021   Lab Results  Component Value Date   CRP 0.6 06/10/2021   Imaging Reviewed:  MRI brain [facility] 06/13/2021: 1. No acute intracranial  abnormality. Specifically, no MRI evidence for encephalitis, paraneoplastic disease, or intracranial infection. 2. 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 No associated mass effect. This is felt to be incidental in nature and of doubtful significance. 3. Otherwise normal brain MRI for age. 4. Small right greater than left mastoid effusions.  MRA head WO contrast 06/13/2021: Normal intracranial MRA. No MRI evidence for CNS vasculitis.  MRI thoracic and lumbar spine WWO contrast 06/13/2021: 1. Acute compression fracture involving the superior endplate of L1 with up to 35% height loss without significant bony retropulsion. 2. Additional probable acute fracture  of the left sacrum, partially visualized. Follow-up with dedicated CT and/or MRI of the sacrum could be performed for further evaluation as warranted. 3. Mild edema within the spinous processes of T4 and T5, of uncertain etiology, but could reflect additional subtle fractures given the above findings. Correlation with physical exam for possible pain at this location recommended. 4. Normal MRI appearance of the thoracic spinal cord, conus medullaris, and cauda equina. No cord signal changes to suggest myelopathy. No abnormal enhancement. 5. Moderate lumbar spondylosis with resultant moderate spinal stenosis at L1-2 and L3-4. Additional more mild spondylosis elsewhere within the thoracolumbar spine as above. No other significant stenosis or neural impingement. 6. Prominent distension of the urinary bladder. Clinical correlation for possible urinary retention recommended.  Assessment: 87 year old male with history of squamous cell carcinoma of back status-post resection, atrial fibrillation on Eliquis who has had worsening right more than left lower extremity weakness over 3 weeks as well as right lower extremity rash. - MRI lumbar spine with evidence of acute  compression fracture involving the superior endplate of L1 with up to 35% height loss without significant bony retropulsion with an additional probably acute fracture of the left sacrum.  Discussed with Dr. Jeannine Boga of neuroradiology - Initial CSF findings significant for pleocytosis with lymphocytic dominance, a normal glucose of 55, and a total protein elevation at 75 concerning for viral process. 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 VZV IgG CSF sent out.  - Differentials include vasculitis neuropathy (VZV vasculitis) versus paraneoplastic disease versus medication effect (Eliquis) versus postherpetic neuralgia which is felt to be less likely as the distribution does not appear typical for shingles.  Lymphoma should also be considered given the CSF findings and he may need a repeat lumbar puncture for flow cytometry - Negative work-up so far: HIV, hepatitis panel, Lyme disease neurology, CK, C3 and C4 complement, C1 esterase inhibitor, ANA, anti-DNA, rheumatoid factor, Sjogren's syndrome, anti-smooth antibody - If CSF is negative, will consider IV Solu-Medrol 1,000 mg daily. Since patient's weakness appears stable and he is not acutely worsening at this time, will defer IV steroids pending CSF results due to risks of IV steroid use in the setting of acute fractures identified on MRI imaging.   Impression:  Bilateral lower extremity paresis Right lower extremity rash Acute L1 compression fracture, possible subtle fractures at T4/T5 Acute probable fraction of the left sacrum Hyponatremia   Recommendations:  # Concern for vasculitis  - Follow up on pending labs: RPR, protein electrophoresis, lupus anticoagulant panel, Lyme disease DNA by PCR; CSF: IgG, VZV PCR, HSV 1/2 PCR, Oligoclonal bands, CSF culture, VZV IgG send out  - Contacted lab to attempt to obtain CSF cell counts on tube #4, pending - Continue pregabalin 50 mg BID - We will again hold Eliquis at this time, given further work-up  including repeat lumbar puncture for flow cytometry may be needed  # Concern for occult malignancy - CT chest abdomen pelvis for malignancy screening  # Possible thoracic and sacral fractures - Given patient was sleeping comfortably on attending examination late in the evening, will examine carefully tomorrow for thoracic spine tenderness correlating to MRI thoracic spine findings at T4/T5 - If CT is un-revealing, may pursue sacral MRI for further characterization  Anibal Henderson, AGACNP-BC [facility] [phone]  Attending Neurologist's note:  I personally saw this patient, reviewing relevant labs, personally reviewing relevant imaging including MRI brain, MRA, MRI T- and L-spine, and formulated the assessment and plan, adding the note above for completeness and clarity  to accurately reflect my thoughts

## 2021-06-13 NOTE — Plan of Care (Signed)
  Problem: Education: Goal: Knowledge of General Education information will improve Description Including pain rating scale, medication(s)/side effects and non-pharmacologic comfort measures Outcome: Progressing   Problem: Health Behavior/Discharge Planning: Goal: Ability to manage health-related needs will improve Outcome: Progressing   

## 2021-06-13 NOTE — Progress Notes (Signed)
Physical Therapy Treatment Patient Details Name: Parker Odonnell. MRN: [id number] DOB: 09/08/1932 Today's Date: 06/13/2021    History of Present Illness 87 y.o. male  presented 06/10/21 with rash, weakness. 8/14 at [facility] for same; ?shingles, started acyclovir and discharged. Rash did not improve.  PMH significant of CAD; CHF; HLD; afib on Eliquis; and HTN.    PT Comments    Pt received in bed, sleeping but easy to wake. Eager and ready to participate in therapy. He required min guard assist supine to sit, mod assist sit to stand with RW, and +2 mod assist SPT with RW. BLE remained in flexed position during stance and transfer. Pt only able to sustain static stand with RW 15-20 seconds/trial. Unsuccessful with gait attempts due to LE weakness. Pt in recliner with feet elevated at end of session.    Follow Up Recommendations  CIR     Equipment Recommendations  Rolling walker with 5" wheels    Recommendations for Other Services       Precautions / Restrictions Precautions Precautions: Fall Precaution Comments: per son, 8/14 after legs began feeling weak    Mobility  Bed Mobility Overal bed mobility: Needs Assistance Bed Mobility: Supine to Sit     Supine to sit: Min guard;HOB elevated     General bed mobility comments: +rails, increased time and cues    Transfers Overall transfer level: Needs assistance Equipment used: Rolling walker (2 wheeled) Transfers: Sit to/from [facility] Sit to Stand: Mod assist Stand pivot transfers: Mod assist;+2 physical assistance       General transfer comment: +1 assist sit to stand from EOB x 3 trials. Pt unable to take steps due to LE weakness. Recliner placed next to bed. BLE remained in flexed position during stance/transfer. Required NT to step in for +2 assist to complete pivot to recliner.  Ambulation/Gait             General Gait Details: unable   Stairs             Wheelchair Mobility     Modified Rankin (Stroke Patients Only)       Balance Overall balance assessment: Needs assistance Sitting-balance support: No upper extremity supported;Feet supported Sitting balance-Leahy Scale: Fair     Standing balance support: Bilateral upper extremity supported;During functional activity Standing balance-Leahy Scale: Poor Standing balance comment: reliant on external support                            Cognition Arousal/Alertness: Awake/alert Behavior During Therapy: WFL for tasks assessed/performed Overall Cognitive Status: Within Functional Limits for tasks assessed                                        Exercises      General Comments General comments (skin integrity, edema, etc.): VSS on RA. Pt soiled with stool in bed. Dependent pericare with pt standing with RW.  Only able to stand 15-20 seconds per trial.      Pertinent Vitals/Pain Pain Assessment: Faces Faces Pain Scale: Hurts a little bit Pain Location: generalized with mobility Pain Descriptors / Indicators: Grimacing;Discomfort Pain Intervention(s): Monitored during session;Limited activity within patient's tolerance;Repositioned    Home Living                      Prior Function  PT Goals (current goals can now be found in the care plan section) Acute Rehab PT Goals Patient Stated Goal: hoping to find out what's wrong with his legs Progress towards PT goals: Not progressing toward goals - comment (increased BLE weakness)    Frequency    Min 3X/week      PT Plan Current plan remains appropriate    Co-evaluation              AM-PAC PT "6 Clicks" Mobility   Outcome Measure  Help needed turning from your back to your side while in a flat bed without using bedrails?: None Help needed moving from lying on your back to sitting on the side of a flat bed without using bedrails?: A Little Help needed moving to and from a bed to a chair  (including a wheelchair)?: Total Help needed standing up from a chair using your arms (e.g., wheelchair or bedside chair)?: A Lot Help needed to walk in hospital room?: Total Help needed climbing 3-5 steps with a railing? : Total 6 Click Score: 12    End of Session Equipment Utilized During Treatment: Gait belt Activity Tolerance: Patient tolerated treatment well Patient left: in chair;with call bell/phone within reach;with chair alarm set Nurse Communication: Mobility status PT Visit Diagnosis: Other abnormalities of gait and mobility (R26.89);Muscle weakness (generalized) (M62.81);History of falling (Z91.81)     Time: [id number] PT Time Calculation (min) (ACUTE ONLY): 25 min  Charges:  $Therapeutic Activity: 23-37 mins                     Lorrin Goodell, PT  Office # [phone] Pager [phone]    Lorriane Shire 06/13/2021, 8:38 AM

## 2021-06-13 NOTE — Progress Notes (Signed)
Occupational Therapy Treatment Patient Details Name: Parker Odonnell. MRN: [id number] DOB: 02-Mar-1932 Today's Date: 06/13/2021    History of present illness 87 y.o. male  presented 06/10/21 with rash, weakness. 8/14 at [facility] for same; ?shingles, started acyclovir and discharged. Rash did not improve.  PMH significant of CAD; CHF; HLD; afib on Eliquis; and HTN.   OT comments  Patient with incremental gains toward patient goals.  He is needing up to [place] for upper body ADL, and up to Mod A for Lower Body ADL from a sit/stand level.  Patient continues to have difficulty with weight shifting and moving his feet for transfers, up to Mod A.  CIR has been recommended.  Acute OT will continue to follow in the acute setting to maximize his functional status.   Follow Up Recommendations  CIR    Equipment Recommendations  Wheelchair (measurements OT);Wheelchair cushion (measurements OT)    Recommendations for Other Services      Precautions / Restrictions Precautions Precautions: Fall Precaution Comments: per son, 8/14 after legs began feeling weak Restrictions Weight Bearing Restrictions: No       Mobility Bed Mobility Overal bed mobility: Needs Assistance Bed Mobility: Supine to Sit     Supine to sit: Min guard;HOB elevated     General bed mobility comments: up in the recliner    Transfers Overall transfer level: Needs assistance Equipment used: Rolling walker (2 wheeled) Transfers: Sit to/from Stand Sit to Stand: Mod assist Stand pivot transfers: Mod assist;+2 physical assistance       General transfer comment: +1 assist sit to stand from EOB x 3 trials. Pt unable to take steps due to LE weakness. Recliner placed next to bed. BLE remained in flexed position during stance/transfer. Required NT to step in for +2 assist to complete pivot to recliner.    Balance Overall balance assessment: Needs assistance Sitting-balance support: Feet supported Sitting balance-Leahy Scale:  Fair     Standing balance support: Bilateral upper extremity supported;During functional activity Standing balance-Leahy Scale: Poor Standing balance comment: reliant on external support                           ADL either performed or assessed with clinical judgement   ADL       Grooming: Wash/dry hands;Wash/dry face;Set up;Sitting           Upper Body Dressing : Minimal assistance;Sitting   Lower Body Dressing: Moderate assistance;Sit to/from stand                                         Cognition Arousal/Alertness: Awake/alert Behavior During Therapy: WFL for tasks assessed/performed Overall Cognitive Status: Within Functional Limits for tasks assessed                                          Exercises     Shoulder Instructions       General Comments VSS on RA. Pt soiled with stool in bed. Dependent pericare with pt standing with RW.  Only able to stand 15-20 seconds per trial.    Pertinent Vitals/ Pain       Pain Assessment: 0-10 Pain Score: 3  Faces Pain Scale: Hurts a little bit Pain Location: R leg Pain Descriptors /  Indicators: Tender Pain Intervention(s): Monitored during session                                                          Frequency  Min 2X/week        Progress Toward Goals  OT Goals(current goals can now be found in the care plan section)  Progress towards OT goals: Progressing toward goals  Acute Rehab OT Goals Patient Stated Goal: Need to move better OT Goal Formulation: With patient Time For Goal Achievement: 06/25/21 Potential to Achieve Goals: [place] Discharge plan needs to be updated    Co-evaluation                 AM-PAC OT "6 Clicks" Daily Activity     Outcome Measure   Help from another person eating meals?: None Help from another person taking care of personal grooming?: None Help from another person toileting, which includes  using toliet, bedpan, or urinal?: A Lot Help from another person bathing (including washing, rinsing, drying)?: A Lot Help from another person to put on and taking off regular upper body clothing?: A Little Help from another person to put on and taking off regular lower body clothing?: A Lot 6 Click Score: 17    End of Session Equipment Utilized During Treatment: Gait belt;Rolling walker  OT Visit Diagnosis: Unsteadiness on feet (R26.81);Other abnormalities of gait and mobility (R26.89);Muscle weakness (generalized) (M62.81);Pain Pain - Right/Left: Right Pain - part of body: Ankle and joints of foot   Activity Tolerance     Patient Left with call bell/phone within reach;in chair;with chair alarm set   Nurse Communication          Time: 1051-1106 OT Time Calculation (min): 15 min  Charges: OT General Charges $OT Visit: 1 Visit OT Treatments $Self Care/Home Management : 8-22 mins  06/13/2021  RP, OTR/L  Acute Rehabilitation Services  Office:  [phone]    Metta Clines 06/13/2021, 11:11 AM

## 2021-06-13 NOTE — Progress Notes (Addendum)
PROGRESS NOTE        PATIENT DETAILS Name: Parker Odonnell. Age: 87 y.o. Sex: male Date of Birth: 09-Jan-1932 Admit Date: 06/10/2021 Admitting Physician Evalee Mutton Kristeen Mans, MD [id number], Dwyane Luo, MD  Brief Narrative: Patient is a 87 y.o. male persistent atrial fibrillation on Eliquis, HFrEF, HTN who presented to the hospital with right leg weakness and a purpuric right leg rash.  Given broad differential diagnosis (infectious/autoimmune/structural)-admitted to the [facility] service for further evaluation.  Work-up in progress-see below for results-ID/neurology following.  Significant events: 8/14>> presented to ED with right leg weakness/pain-fall-found to have rash-thought to have shingles-started on Valtrex and discharged  8/23>> admit to [facility] for right leg weakness/purpuric right leg rash eval/management 8/24>> punch biopsy by surgery 8/25>> LP-CSF w leukocytosis (lymphocytes) and elevated protein-Acyclovir started.  Significant radiological studies:  8/14>> CT head: No acute intracranial findings. 8/14>>X-ray right hip: Degenerative changes in the right hip joint. 8/14>> x-ray right knee: Degenerative changes without any acute bony abnormality. 8/24>> CXR: No pneumonia. 8/25>> MRI brain: No evidence of encephalitis/paraneoplastic disease/intracranial infection. 8/25>> MRA brain: No evidence of CNS vasculitis-no major stenosis. 8/25>> MRI thoracic/lumbar-spine: Compression fracture superior endplate of L1, acute fracture of left sacrum, mild edema within spinous process of T4/T5.  No MRI changes to suggest myelopathy.  Significant serological studies: 8/23>> TSH: Within normal limit 8/24>> RA factor: Negative 8/24>> ANA: Negative 8/24>> ds DNA antibody/anti-smooth antibody: Negative 8/24>> Sjogren's A/Sjogren's B antibody: Negative 8/24>> anti-smooth muscle antibody: Negative 8/24>> ANCA antibodies: Negative 8/24>> cardiolipin antibodies: Negative 8/24>>  Lyme antibody: Negative 8/24>> C3/C4/total complement: Normal limits 8/24>> C1 esterase inhibitor: Normal limit 8/24>> cryoglobulin: Pending 8/24>> SPEP/UPEP: Pending 8/24>> Lupus anticoagulant: Pending  Microbiology data: 8/23>> COVID PCR: Negative 8/23>> blood culture: No growth 8/24>> acute hepatitis panel: Negative 8/24>> HIV: Negative 8/25>> blood culture: No growth 8/25>> CSF cryptococcal antigen: Negative 8/25>> CSF culture: Pending 8/25>> CSF VDRL: Pending 8/25>> CSF VZV PCR: Pending 8/25>> CSF HSV PCR: Pending 8/25>>CSF ARUP Encephalitis panel:Pending  Pathology data: 8/24>> skin biopsy: Pending 8/25>> CSF cytology: Pending  Antimicrobial therapy: Acyclovir: 8/25>>  Procedures : 8/24>> punch biopsy performed by general surgery: Pending biopsy results 8/25>> lumbar puncture by neurology  Consults: Neuro, ID, general surgery  DVT Prophylaxis : apixaban (ELIQUIS) tablet 2.5 mg Start: 06/12/21 2200  Eliquis on hold apixaban (ELIQUIS) tablet 2.5 mg   Subjective: Stable overnight-no major complaints.  Right leg weakness and rash appears unchanged.   Assessment/Plan: Right leg weakness/purpuric right lower extremity rash: Etiology unclear-differentials include infectious etiology or vasculitis (work-up negative so far)-extensive work-up in progress (see above).  Skin biopsy still pending.  Await further recommendations from ID/neurology.     Hyponatremia: Volume status is stable-although he has a history of HFrEF-I wonder if he has developed SIADH physiology.  He is on low-dose IV fluid because he is on acyclovir.  Watch closely-if sodium level start decreasing further-May need to be placed on Lasix.  If no etiology for right leg weakness becomes apparent-May need to pursue CT chest.  Persistent atrial fibrillation: Rate controlled-on Coreg-Eliquis to be resumed on 8/26-he is s/p LP.  HFrEF: Volume status is stable-continue to hold diuretics for now.  History of  CAD-s/p remote PCI: No anginal symptoms.  HLD: Continue statin  HTN: BP stable-continue losartan/Coreg-follow and adjust.  Diet: Diet Order  Diet regular Room service appropriate? Yes; Fluid consistency: Thin  Diet effective now                    Code Status:  DNR  Family Communication: Daughter-[phone]-updated over the phone on 8/26.  Disposition Plan: Status is: Inpatient  The patient will require care spanning > 2 midnights and should be moved to inpatient because: Inpatient level of care appropriate due to severity of illness  Dispo: The patient is from: Home              Anticipated d/c is to: SNF              Patient currently is not medically stable to d/c.   Difficult to place patient No     Barriers to Discharge: Right lower extremity weakness-rash of unknown etiology-work-up in progress  Antimicrobial agents: Anti-infectives (From admission, onward)    Start     Dose/Rate Route Frequency Ordered Stop   06/12/21 1815  acyclovir (ZOVIRAX) 660 mg in dextrose 5 % 100 mL IVPB        10 mg/kg  66.2 kg 113.2 mL/hr over 60 Minutes Intravenous Every 12 hours 06/12/21 1718          Time spent: 35 minutes-Greater than 50% of this time was spent in counseling, explanation of diagnosis, planning of further management, and coordination of care.  MEDICATIONS: Scheduled Meds:  apixaban  2.5 mg Oral BID   atorvastatin  40 mg Oral QHS   carvedilol  3.125 mg Oral BID WC   docusate sodium  100 mg Oral BID   feeding supplement  237 mL Oral BID BM   losartan  50 mg Oral Daily   multivitamin with minerals  1 tablet Oral Daily   mupirocin cream   Topical BID   pregabalin  50 mg Oral BID   Continuous Infusions:  acyclovir 660 mg (06/13/21 0833)   PRN Meds:.acetaminophen **OR** acetaminophen, bisacodyl, hydrALAZINE, HYDROcodone-acetaminophen, morphine injection, ondansetron **OR** ondansetron (ZOFRAN) IV, polyethylene glycol, polyvinyl  alcohol, traMADol   PHYSICAL EXAM: Vital signs: Vitals:   06/13/21 0006 06/13/21 0402 06/13/21 0816 06/13/21 1208  BP: (!) 133/59 (!) 155/71 136/78 (!) 101/47  Pulse: 72 72 89 60  Resp: '18 13 17 17  '$ Temp: 97.7 F (36.5 C) 97.9 F (36.6 C) 98 F (36.7 C) 97.6 F (36.4 C)  TempSrc: Oral  Oral Oral  SpO2: 99% 97% 90% 97%   There were no vitals filed for this visit. There is no height or weight on file to calculate BMI.   Gen Exam:Alert awake-not in any distress HEENT:atraumatic, normocephalic Chest: B/L clear to auscultation anteriorly CVS:S1S2 regular Abdomen:soft non tender, non distended Extremities:no edema Neurology: Continues to have mostly right lower extremity weakness-approximately3+-4/5 Skin: Purpuric rash appears unchanged and right lower extremity.  I have personally reviewed following labs and imaging studies  LABORATORY DATA: CBC: Recent Labs  Lab 06/10/21 1310 06/11/21 0143 06/12/21 0149 06/12/21 1455 06/13/21 0224  WBC 5.7 5.9 5.3 5.3 6.0  NEUTROABS 4.0  --   --  3.6  --   HGB 13.7 13.0 11.5* 12.4* 11.3*  HCT 39.6 36.9* 33.3* 36.4* 33.3*  MCV 98.0 96.9 98.2 98.6 99.7  PLT 170 168 134* 150 148*     Basic Metabolic Panel: Recent Labs  Lab 06/10/21 1310 06/11/21 0143 06/12/21 0149 06/12/21 1455 06/13/21 0224  NA 125* 125* 130* 130* 128*  K 4.2 3.8 3.9 4.0 3.9  CL 95* 97* 104  103 102  CO2 23 21* 21* 23 22  GLUCOSE 109* 116* 102* 99 119*  BUN '14 13 13 12 13  '$ CREATININE 0.97 0.86 0.82 0.92 0.82  CALCIUM 9.6 9.2 9.0 9.1 8.8*     GFR: Estimated Creatinine Clearance: 60.5 mL/min (by C-G formula based on SCr of 0.82 mg/dL).  Liver Function Tests: Recent Labs  Lab 06/10/21 1310 06/12/21 1455 06/13/21 0224  AST '26 26 23  '$ ALT '29 27 24  '$ ALKPHOS 111 111 99  BILITOT 2.3* 1.4* 1.9*  PROT 7.1 6.3* 5.5*  ALBUMIN 3.8 3.2* 3.0*    No results for input(s): LIPASE, AMYLASE in the last 168 hours. No results for input(s): AMMONIA in the last  168 hours.  Coagulation Profile: No results for input(s): INR, PROTIME in the last 168 hours.  Cardiac Enzymes: Recent Labs  Lab 06/11/21 1406  CKTOTAL 62     BNP (last 3 results) No results for input(s): PROBNP in the last 8760 hours.  Lipid Profile: No results for input(s): CHOL, HDL, LDLCALC, TRIG, CHOLHDL, LDLDIRECT in the last 72 hours.  Thyroid Function Tests: Recent Labs    06/10/21 1310  TSH 1.875     Anemia Panel: No results for input(s): VITAMINB12, FOLATE, FERRITIN, TIBC, IRON, RETICCTPCT in the last 72 hours.  Urine analysis:    Component Value Date/Time   COLORURINE YELLOW 06/10/2021 2100   APPEARANCEUR HAZY (A) 06/10/2021 2100   LABSPEC 1.008 06/10/2021 2100   PHURINE 7.0 06/10/2021 2100   GLUCOSEU NEGATIVE 06/10/2021 2100   HGBUR NEGATIVE 06/10/2021 2100   BILIRUBINUR NEGATIVE 06/10/2021 2100   KETONESUR NEGATIVE 06/10/2021 2100   PROTEINUR NEGATIVE 06/10/2021 2100   UROBILINOGEN 0.2 06/13/2012 1027   NITRITE NEGATIVE 06/10/2021 2100   LEUKOCYTESUR MODERATE (A) 06/10/2021 2100    Sepsis Labs: Lactic Acid, Venous No results found for: LATICACIDVEN  MICROBIOLOGY: Recent Results (from the past 240 hour(s))  Culture, blood (routine x 2)     Status: None (Preliminary result)   Collection Time: 06/10/21 12:55 PM   Specimen: BLOOD  Result Value Ref Range Status   Specimen Description BLOOD BLOOD RIGHT FOREARM  Final   Special Requests   Final    BOTTLES DRAWN AEROBIC AND ANAEROBIC Blood Culture adequate volume   Culture   Final    NO GROWTH 3 DAYS Performed at [facility], [place] [address], [place], [place] [address]    Report Status PENDING  Incomplete  Culture, blood (routine x 2)     Status: None (Preliminary result)   Collection Time: 06/10/21  1:10 PM   Specimen: BLOOD  Result Value Ref Range Status   Specimen Description BLOOD RIGHT ANTECUBITAL  Final   Special Requests   Final    BOTTLES DRAWN AEROBIC AND ANAEROBIC Blood  Culture adequate volume   Culture   Final    NO GROWTH 3 DAYS Performed at [facility], [place] [address] [place], [place] [address]    Report Status PENDING  Incomplete  SARS CORONAVIRUS 2 (TAT 6-24 HRS) Nasopharyngeal Nasopharyngeal Swab     Status: None   Collection Time: 06/10/21  7:11 PM   Specimen: Nasopharyngeal Swab  Result Value Ref Range Status   SARS Coronavirus 2 NEGATIVE NEGATIVE Final    Comment: (NOTE) SARS-CoV-2 target nucleic acids are NOT DETECTED.  The SARS-CoV-2 RNA is generally detectable in upper and lower respiratory specimens during the acute phase of infection. Negative results do not preclude SARS-CoV-2 infection, do not rule  out co-infections with other pathogens, and should not be used as the sole basis for treatment or other patient management decisions. Negative results must be combined with clinical observations, patient history, and epidemiological information. The expected result is Negative.  Fact Sheet for Patients: SugarRoll.be  Fact Sheet for Healthcare Providers: https://www.woods-mathews.com/  This test is not yet approved or cleared by the [place] FDA and  has been authorized for detection and/or diagnosis of SARS-CoV-2 by FDA under an Emergency Use Authorization (EUA). This EUA will remain  in effect (meaning this test can be used) for the duration of the COVID-19 declaration under Se ction 564(b)(1) of the Act, 21 U.S.C. section 360bbb-3(b)(1), unless the authorization is terminated or revoked sooner.  Performed at [facility], [place] [address], [place], [place] [address]   Aerobic/Anaerobic Culture w Gram Stain (surgical/deep wound)     Status: None (Preliminary result)   Collection Time: 06/11/21 11:23 AM   Specimen: Tissue  Result Value Ref Range Status   Specimen Description TISSUE  Final   Special Requests SKIN EXCISIONAL BIOPSY  Final   Gram Stain   Final    RARE WBC  PRESENT,BOTH PMN AND MONONUCLEAR NO ORGANISMS SEEN    Culture   Final    NO GROWTH 2 DAYS Performed at [facility], [place] [address] [place], [place] [address]    Report Status PENDING  Incomplete  CSF culture w Stat Gram Stain     Status: None (Preliminary result)   Collection Time: 06/12/21  1:36 PM   Specimen: CSF; Cerebrospinal Fluid  Result Value Ref Range Status   Specimen Description CSF  Final   Special Requests NONE  Final   Gram Stain   Final    WBC PRESENT, PREDOMINANTLY MONONUCLEAR NO ORGANISMS SEEN CYTOSPIN SMEAR Performed at [facility], [place] [address], [place], [place] [address]    Culture PENDING  Incomplete   Report Status PENDING  Incomplete  Culture, blood (routine x 2)     Status: None (Preliminary result)   Collection Time: 06/12/21  2:47 PM   Specimen: BLOOD RIGHT ARM  Result Value Ref Range Status   Specimen Description BLOOD RIGHT ARM  Final   Special Requests   Final    BOTTLES DRAWN AEROBIC AND ANAEROBIC Blood Culture adequate volume   Culture   Final    NO GROWTH < 24 HOURS Performed at [facility], [place] [address] [place], [place] [address]    Report Status PENDING  Incomplete  Culture, blood (routine x 2)     Status: None (Preliminary result)   Collection Time: 06/12/21  2:55 PM   Specimen: BLOOD LEFT ARM  Result Value Ref Range Status   Specimen Description BLOOD LEFT ARM  Final   Special Requests   Final    BOTTLES DRAWN AEROBIC AND ANAEROBIC Blood Culture adequate volume   Culture   Final    NO GROWTH < 24 HOURS Performed at [facility], [place] [address] [place], [place] [address]    Report Status PENDING  Incomplete    RADIOLOGY STUDIES/RESULTS: MR ANGIO HEAD WO CONTRAST  Result Date: 06/13/2021 CLINICAL DATA:  Initial evaluation for CNS vasculitis, bilateral lower extremity weakness. EXAM: MRA HEAD WITHOUT CONTRAST TECHNIQUE: Angiographic images of the Circle of Willis were acquired using MRA technique  without intravenous contrast. COMPARISON:  None available. FINDINGS: Anterior circulation: Examination mildly degraded by motion artifact. Visualized distal cervical segments of the internal  carotid arteries are patent with antegrade flow. Petrous, cavernous, and supraclinoid segments patent without stenosis or other abnormality. A1 segments patent bilaterally. Possible short-segment fenestration at the left aspect of the anterior communicating artery complex noted. Anterior communicating artery complex otherwise unremarkable. Both anterior cerebral arteries patent to their distal aspects without stenosis. No M1 stenosis or occlusion. Normal MCA bifurcations. Distal MCA branches perfused and grossly symmetric. Posterior circulation: Left vertebral artery strongly dominant and widely patent to the vertebrobasilar junction. Left PICA patent. Hypoplastic right vertebral artery largely terminates in PICA. Basilar patent to its distal aspect without stenosis. Superior cerebellar arteries patent bilaterally. Both PCAs primarily supplied via the basilar well perfused to their distal aspects. Anatomic variants: Hypoplastic right vertebral artery terminates in PICA. Other: No intracranial aneurysm. No significant vascular irregularity or beading to suggest CNS vasculitis, although the distal small vessels are somewhat limited assessment due to motion artifact. IMPRESSION: Normal intracranial MRA. No MRI evidence for CNS vasculitis. Electronically Signed   By: Jeannine Boga M.D.   On: 06/13/2021 02:36   MR BRAIN W WO CONTRAST  Result Date: 06/13/2021 CLINICAL DATA:  Initial evaluation for right worse than left lower extremity weakness over 3 weeks. Evaluate for CNS vasculitis versus paraneoplastic disease versus infection. EXAM: MRI HEAD WITHOUT AND WITH CONTRAST TECHNIQUE: Multiplanar, multiecho pulse sequences of the brain and surrounding structures were obtained without and with intravenous contrast. CONTRAST:   6.38m GADAVIST GADOBUTROL 1 MMOL/ML IV SOLN COMPARISON:  None available. FINDINGS: Brain: Mild diffuse prominence of the CSF containing spaces compatible generalized age-related cerebral atrophy. No significant cerebral white matter disease for age. No other focal parenchymal signal abnormality to suggest acute encephalitis, including limbic encephalitis or other paraneoplastic process. No abnormal foci of restricted of restricted diffusion to suggest acute or subacute ischemia. Gray-white matter differentiation maintained. No encephalomalacia to suggest chronic cortical infarction. No foci of susceptibility artifact to suggest acute or chronic intracranial hemorrhage. No made of a 9 mm nodular lesion along the left lateral aspect of the medulla, immediately adjacent to the foramen of Luschka (series 9, image 4). This lesion demonstrates no appreciable enhancement following contrast administration. No PICA aneurysm seen on corresponding MRA. 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 No associated mass effect. No other mass lesion, midline shift or mass effect. Mild ventricular prominence related to global parenchymal volume loss without hydrocephalus. No extra-axial fluid collection. Pituitary gland and suprasellar region within normal limits. Midline structures intact and normal. No abnormal enhancement. No findings to suggest meningitis or other CNS infection. Vascular: Major intracranial vascular flow voids are maintained. Skull and upper cervical spine: Degenerative thickening noted about the tectorial membrane without significant stenosis. Craniocervical junction otherwise unremarkable. Bone marrow signal intensity within normal limits. No focal marrow replacing lesion. No scalp soft tissue abnormality. Sinuses/Orbits: Globes and orbital soft tissues within normal limits. Mild scattered mucosal thickening noted within the ethmoidal air cells. Paranasal sinuses are otherwise clear.  Small right greater than left mastoid effusions noted. Visualized nasopharynx unremarkable. Inner ear structures grossly within normal limits. Other: None. IMPRESSION: 1. No acute intracranial abnormality. Specifically, no MRI evidence for encephalitis, paraneoplastic disease, or intracranial infection. 2. 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 No associated mass effect. This is felt to be incidental in nature and of doubtful significance. 3. Otherwise normal brain MRI for age. 4. Small right greater than left mastoid effusions. Electronically Signed   By: BMarland Kitchen  Jeannine Boga M.D.   On: 06/13/2021 02:30   MR THORACIC SPINE W WO CONTRAST  Result Date: 06/13/2021 CLINICAL DATA:  Initial evaluation for myelopathy, acute or progressive. EXAM: MRI THORACIC AND LUMBAR SPINE WITHOUT AND WITH CONTRAST TECHNIQUE: Multiplanar and multiecho pulse sequences of the thoracic and lumbar spine were obtained without and with intravenous contrast. CONTRAST:  6.4m GADAVIST GADOBUTROL 1 MMOL/ML IV SOLN COMPARISON:  None available. FINDINGS: MRI THORACIC SPINE FINDINGS Alignment: Mild dextroscoliosis. Alignment otherwise normal with preservation of the normal thoracic kyphosis. No listhesis. Vertebrae: Vertebral body height maintained without acute or chronic fracture. Bone marrow signal intensity diffusely heterogeneous without worrisome osseous lesion. Mild edema noted within the spinous processes of T4 and T5, of uncertain etiology or significance (series 1, images 7, 6). No visible discrete osseous lesion, fracture, or other abnormality evident by MRI. The surrounding soft tissues are within normal limits. Mild edema and enhancement noted within the inferior endplate of [id number] favored discogenic and reactive in nature. No other abnormal marrow edema or enhancement within the thoracic spine. Cord: Normal signal and morphology. No abnormal  enhancement. No epidural collections. Paraspinal and other soft tissues: Visualized paraspinous soft tissues demonstrate no acute finding. Small layering bilateral pleural effusions noted, left slightly larger than right. 5.2 cm exophytic cyst noted extending from the left kidney. Disc levels: T4-5: Small central disc protrusion indents the ventral thecal sac (series 22, image 14). Posterior element hypertrophy. No significant stenosis. T6-7: Small central disc protrusion indents the ventral thecal sac (series 22, image 20). Mild flattening of the ventral cord without cord signal changes. Left-sided facet hypertrophy. No spinal stenosis. T7-8: Small central disc protrusion indents the ventral thecal sac (series 22, image 23). Mild flattening of the ventral cord without cord signal changes. Mild posterior element hypertrophy. No significant stenosis. T12-L1: Diffuse disc bulge with bilateral facet hypertrophy. No significant spinal stenosis. Mild bilateral foraminal narrowing. No other significant disc pathology within the thoracic spine for age. Multilevel facet hypertrophy noted throughout the mid and lower thoracic spine. No significant spinal stenosis. MRI LUMBAR SPINE FINDINGS Segmentation:  Examination degraded by motion artifact. Standard segmentation. Lowest well-formed disc space labeled the L5-S1 level. Alignment: 3 mm anterolisthesis of L3 on L4, with trace retrolisthesis of L4 on L5. Findings chronic and facet mediated. Underlying levoscoliosis. Vertebrae: Acute compression fracture seen involving the superior endplate of L1. Associated height loss measures up to 35% without significant bony retropulsion. Additionally, there is a suspected left sacral fracture, seen only on sagittal projection (series 2, image 14). Otherwise, vertebral body height maintained with no other acute or chronic fracture. Visualized bone marrow signal intensity diffusely heterogeneous without discrete worrisome osseous lesion.  No other abnormal marrow edema or enhancement. Conus medullaris: Extends to the T12-L1 level and appears normal. No visible abnormal enhancement on this motion degraded exam. Paraspinal and other soft tissues: Paraspinous soft tissues demonstrate no acute finding. Prominent distension of the partially visualized urinary bladder noted. Disc levels: L1-2: Disc bulge with disc desiccation and intervertebral disc space narrowing. Mild to moderate bilateral facet hypertrophy. Resultant moderate spinal stenosis, with mild to moderate bilateral L1 foraminal narrowing. L2-3: Mild disc bulge. Moderate bilateral facet hypertrophy. No significant spinal stenosis. Foramina remain patent. L3-4: Anterolisthesis. Degenerative intervertebral disc space narrowing with diffuse disc bulge and disc desiccation. Disc bulging eccentric to the right. Moderate bilateral facet hypertrophy. Resultant moderate spinal stenosis. Mild to moderate right with mild left L3 foraminal narrowing. L4-5: Disc bulge with disc desiccation. Moderate bilateral facet hypertrophy. Mild  narrowing of the lateral recesses bilaterally. Central canal remains patent. Mild to moderate left L4 foraminal narrowing. Right neural foramen remains patent. L5-S1: Degenerative intervertebral disc space narrowing with disc desiccation and diffuse disc bulge. Reactive endplate spurring. Mild left greater than right facet hypertrophy. No significant spinal stenosis. Foramina remain patent. IMPRESSION: 1. Acute compression fracture involving the superior endplate of L1 with up to 35% height loss without significant bony retropulsion. 2. Additional probable acute fracture of the left sacrum, partially visualized. Follow-up with dedicated CT and/or MRI of the sacrum could be performed for further evaluation as warranted. 3. Mild edema within the spinous processes of T4 and T5, of uncertain etiology, but could reflect additional subtle fractures given the above findings.  Correlation with physical exam for possible pain at this location recommended. 4. Normal MRI appearance of the thoracic spinal cord, conus medullaris, and cauda equina. No cord signal changes to suggest myelopathy. No abnormal enhancement. 5. Moderate lumbar spondylosis with resultant moderate spinal stenosis at L1-2 and L3-4. Additional more mild spondylosis elsewhere within the thoracolumbar spine as above. No other significant stenosis or neural impingement. 6. Prominent distension of the urinary bladder. Clinical correlation for possible urinary retention recommended. Electronically Signed   By: Jeannine Boga M.D.   On: 06/13/2021 03:17   MR Lumbar Spine W Wo Contrast  Result Date: 06/13/2021 CLINICAL DATA:  Initial evaluation for myelopathy, acute or progressive. EXAM: MRI THORACIC AND LUMBAR SPINE WITHOUT AND WITH CONTRAST TECHNIQUE: Multiplanar and multiecho pulse sequences of the thoracic and lumbar spine were obtained without and with intravenous contrast. CONTRAST:  6.70m GADAVIST GADOBUTROL 1 MMOL/ML IV SOLN COMPARISON:  None available. FINDINGS: MRI THORACIC SPINE FINDINGS Alignment: Mild dextroscoliosis. Alignment otherwise normal with preservation of the normal thoracic kyphosis. No listhesis. Vertebrae: Vertebral body height maintained without acute or chronic fracture. Bone marrow signal intensity diffusely heterogeneous without worrisome osseous lesion. Mild edema noted within the spinous processes of T4 and T5, of uncertain etiology or significance (series 1, images 7, 6). No visible discrete osseous lesion, fracture, or other abnormality evident by MRI. The surrounding soft tissues are within normal limits. Mild edema and enhancement noted within the inferior endplate of [id number] favored discogenic and reactive in nature. No other abnormal marrow edema or enhancement within the thoracic spine. Cord: Normal signal and morphology. No abnormal enhancement. No epidural collections. Paraspinal  and other soft tissues: Visualized paraspinous soft tissues demonstrate no acute finding. Small layering bilateral pleural effusions noted, left slightly larger than right. 5.2 cm exophytic cyst noted extending from the left kidney. Disc levels: T4-5: Small central disc protrusion indents the ventral thecal sac (series 22, image 14). Posterior element hypertrophy. No significant stenosis. T6-7: Small central disc protrusion indents the ventral thecal sac (series 22, image 20). Mild flattening of the ventral cord without cord signal changes. Left-sided facet hypertrophy. No spinal stenosis. T7-8: Small central disc protrusion indents the ventral thecal sac (series 22, image 23). Mild flattening of the ventral cord without cord signal changes. Mild posterior element hypertrophy. No significant stenosis. T12-L1: Diffuse disc bulge with bilateral facet hypertrophy. No significant spinal stenosis. Mild bilateral foraminal narrowing. No other significant disc pathology within the thoracic spine for age. Multilevel facet hypertrophy noted throughout the mid and lower thoracic spine. No significant spinal stenosis. MRI LUMBAR SPINE FINDINGS Segmentation:  Examination degraded by motion artifact. Standard segmentation. Lowest well-formed disc space labeled the L5-S1 level. Alignment: 3 mm anterolisthesis of L3 on L4, with trace retrolisthesis of L4 on L5.  Findings chronic and facet mediated. Underlying levoscoliosis. Vertebrae: Acute compression fracture seen involving the superior endplate of L1. Associated height loss measures up to 35% without significant bony retropulsion. Additionally, there is a suspected left sacral fracture, seen only on sagittal projection (series 2, image 14). Otherwise, vertebral body height maintained with no other acute or chronic fracture. Visualized bone marrow signal intensity diffusely heterogeneous without discrete worrisome osseous lesion. No other abnormal marrow edema or enhancement.  Conus medullaris: Extends to the T12-L1 level and appears normal. No visible abnormal enhancement on this motion degraded exam. Paraspinal and other soft tissues: Paraspinous soft tissues demonstrate no acute finding. Prominent distension of the partially visualized urinary bladder noted. Disc levels: L1-2: Disc bulge with disc desiccation and intervertebral disc space narrowing. Mild to moderate bilateral facet hypertrophy. Resultant moderate spinal stenosis, with mild to moderate bilateral L1 foraminal narrowing. L2-3: Mild disc bulge. Moderate bilateral facet hypertrophy. No significant spinal stenosis. Foramina remain patent. L3-4: Anterolisthesis. Degenerative intervertebral disc space narrowing with diffuse disc bulge and disc desiccation. Disc bulging eccentric to the right. Moderate bilateral facet hypertrophy. Resultant moderate spinal stenosis. Mild to moderate right with mild left L3 foraminal narrowing. L4-5: Disc bulge with disc desiccation. Moderate bilateral facet hypertrophy. Mild narrowing of the lateral recesses bilaterally. Central canal remains patent. Mild to moderate left L4 foraminal narrowing. Right neural foramen remains patent. L5-S1: Degenerative intervertebral disc space narrowing with disc desiccation and diffuse disc bulge. Reactive endplate spurring. Mild left greater than right facet hypertrophy. No significant spinal stenosis. Foramina remain patent. IMPRESSION: 1. Acute compression fracture involving the superior endplate of L1 with up to 35% height loss without significant bony retropulsion. 2. Additional probable acute fracture of the left sacrum, partially visualized. Follow-up with dedicated CT and/or MRI of the sacrum could be performed for further evaluation as warranted. 3. Mild edema within the spinous processes of T4 and T5, of uncertain etiology, but could reflect additional subtle fractures given the above findings. Correlation with physical exam for possible pain at this  location recommended. 4. Normal MRI appearance of the thoracic spinal cord, conus medullaris, and cauda equina. No cord signal changes to suggest myelopathy. No abnormal enhancement. 5. Moderate lumbar spondylosis with resultant moderate spinal stenosis at L1-2 and L3-4. Additional more mild spondylosis elsewhere within the thoracolumbar spine as above. No other significant stenosis or neural impingement. 6. Prominent distension of the urinary bladder. Clinical correlation for possible urinary retention recommended. Electronically Signed   By: Jeannine Boga M.D.   On: 06/13/2021 03:17     LOS: 2 days   Oren Binet, MD  [facility]    To contact the attending provider between 7A-7P or the covering provider during after hours 7P-7A, please log into the web site [email or web address] and access using universal  password for that web site. If you do not have the password, please call the hospital operator.  06/13/2021, 12:34 PM

## 2021-06-13 NOTE — Progress Notes (Signed)
[facility]  Date of Admission:  06/10/2021           Reason for visit: Follow up on rash  Current antibiotics: Acyclovir 8/25-pres   ASSESSMENT:    87 y.o. male admitted with:  Right lower extremity rash Lower extremity weakness CSF pleocytosis with lymphocyte predominance Concern for vasculitis or paraneoplastic process 06/01/21 ED visit with concern for possible shingles rash treated with PO Valtrex for 7 days Compression fractures noted on MRI  RECOMMENDATIONS:    Overall picture not c/w infectious etiology, however, multiple outstanding labs are pending from CSF studies.  He has no fevers, headache, encephalitis that would be c/w aseptic meningitis.  MRI brain also not showing findings of encephalitis Started on ACV and okay to continue pending HSV/VZV PCR testing Surgical pathology pending from biopsy site Also have sent CSF to [facility] for meningoencephalitis panel for completeness Discussed with neurology and would be okay with steroids if needed from ID standpoint if PCR testing as noted above is negative. Dr Linus Salmons available as needed over the weekend.  Otherwise, new ID team to resume care Monday.   Principal Problem:   Rash Active Problems:   Dyslipidemia   Essential hypertension   Atrial flutter (HCC)   Weakness   Chronic combined systolic (congestive) and diastolic (congestive) heart failure (HCC)   DNR (do not resuscitate)    MEDICATIONS:    Scheduled Meds:  apixaban  2.5 mg Oral BID   atorvastatin  40 mg Oral QHS   carvedilol  3.125 mg Oral BID WC   docusate sodium  100 mg Oral BID   feeding supplement  237 mL Oral BID BM   losartan  50 mg Oral Daily   multivitamin with minerals  1 tablet Oral Daily   mupirocin cream   Topical BID   pregabalin  50 mg Oral BID   Continuous Infusions:  acyclovir 660 mg (06/13/21 0833)   PRN Meds:.acetaminophen **OR** acetaminophen, bisacodyl, hydrALAZINE, HYDROcodone-acetaminophen,  morphine injection, ondansetron **OR** ondansetron (ZOFRAN) IV, polyethylene glycol, polyvinyl alcohol, traMADol  SUBJECTIVE:   24 hour events:  No acute changes   No fevers, chills.  Weakness and rash relatively unchanged.  Working with PT/OT this morning.    Review of Systems  All other systems reviewed and are negative.    OBJECTIVE:   Blood pressure (!) 101/47, pulse 60, temperature 97.6 F (36.4 C), temperature source Oral, resp. rate 17, SpO2 97 %. There is no height or weight on file to calculate BMI.  Physical Exam Constitutional:      General: He is not in acute distress.    Appearance: Normal appearance.     Comments: Sitting up in chair, working with therapy.   HENT:     Head: Normocephalic and atraumatic.     Nose:     Comments: Scabbed lesion on nose. Eyes:     Extraocular Movements: Extraocular movements intact.     Conjunctiva/sclera: Conjunctivae normal.  Pulmonary:     Effort: Pulmonary effort is normal. No respiratory distress.  Abdominal:     General: There is no distension.     Palpations: Abdomen is soft.     Tenderness: There is no abdominal tenderness.  Skin:    Findings: Rash (on right leg) present.  Neurological:     General: No focal deficit present.     Mental Status: He is alert and oriented to person, place, and time.  Psychiatric:  Mood and Affect: Mood normal.        Behavior: Behavior normal.     Lab Results: Lab Results  Component Value Date   WBC 6.0 06/13/2021   HGB 11.3 (L) 06/13/2021   HCT 33.3 (L) 06/13/2021   MCV 99.7 06/13/2021   PLT 148 (L) 06/13/2021    Lab Results  Component Value Date   NA 128 (L) 06/13/2021   K 3.9 06/13/2021   CO2 22 06/13/2021   GLUCOSE 119 (H) 06/13/2021   BUN 13 06/13/2021   CREATININE 0.82 06/13/2021   CALCIUM 8.8 (L) 06/13/2021   GFRNONAA >60 06/13/2021   GFRAA >60 01/12/2020    Lab Results  Component Value Date   ALT 24 06/13/2021   AST 23 06/13/2021   ALKPHOS 99  06/13/2021   BILITOT 1.9 (H) 06/13/2021       Component Value Date/Time   CRP 0.6 06/10/2021 1310       Component Value Date/Time   ESRSEDRATE 15 06/10/2021 1310     I have reviewed the micro and lab results in Epic.  Imaging: MR ANGIO HEAD WO CONTRAST  Result Date: 06/13/2021 CLINICAL DATA:  Initial evaluation for CNS vasculitis, bilateral lower extremity weakness. EXAM: MRA HEAD WITHOUT CONTRAST TECHNIQUE: Angiographic images of the Circle of Willis were acquired using MRA technique without intravenous contrast. COMPARISON:  None available. FINDINGS: Anterior circulation: Examination mildly degraded by motion artifact. Visualized distal cervical segments of the internal carotid arteries are patent with antegrade flow. Petrous, cavernous, and supraclinoid segments patent without stenosis or other abnormality. A1 segments patent bilaterally. Possible short-segment fenestration at the left aspect of the anterior communicating artery complex noted. Anterior communicating artery complex otherwise unremarkable. Both anterior cerebral arteries patent to their distal aspects without stenosis. No M1 stenosis or occlusion. Normal MCA bifurcations. Distal MCA branches perfused and grossly symmetric. Posterior circulation: Left vertebral artery strongly dominant and widely patent to the vertebrobasilar junction. Left PICA patent. Hypoplastic right vertebral artery largely terminates in PICA. Basilar patent to its distal aspect without stenosis. Superior cerebellar arteries patent bilaterally. Both PCAs primarily supplied via the basilar well perfused to their distal aspects. Anatomic variants: Hypoplastic right vertebral artery terminates in PICA. Other: No intracranial aneurysm. No significant vascular irregularity or beading to suggest CNS vasculitis, although the distal small vessels are somewhat limited assessment due to motion artifact. IMPRESSION: Normal intracranial MRA. No MRI evidence for CNS  vasculitis. Electronically Signed   By: Jeannine Boga M.D.   On: 06/13/2021 02:36   MR BRAIN W WO CONTRAST  Result Date: 06/13/2021 CLINICAL DATA:  Initial evaluation for right worse than left lower extremity weakness over 3 weeks. Evaluate for CNS vasculitis versus paraneoplastic disease versus infection. EXAM: MRI HEAD WITHOUT AND WITH CONTRAST TECHNIQUE: Multiplanar, multiecho pulse sequences of the brain and surrounding structures were obtained without and with intravenous contrast. CONTRAST:  6.30m GADAVIST GADOBUTROL 1 MMOL/ML IV SOLN COMPARISON:  None available. FINDINGS: Brain: Mild diffuse prominence of the CSF containing spaces compatible generalized age-related cerebral atrophy. No significant cerebral white matter disease for age. No other focal parenchymal signal abnormality to suggest acute encephalitis, including limbic encephalitis or other paraneoplastic process. No abnormal foci of restricted of restricted diffusion to suggest acute or subacute ischemia. Gray-white matter differentiation maintained. No encephalomalacia to suggest chronic cortical infarction. No foci of susceptibility artifact to suggest acute or chronic intracranial hemorrhage. No made of a 9 mm nodular lesion along the left lateral aspect of the medulla, immediately adjacent  to the foramen of Luschka (series 9, image 4). This lesion demonstrates no appreciable enhancement following contrast administration. No PICA aneurysm seen on corresponding MRA. 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 No associated mass effect. No other mass lesion, midline shift or mass effect. Mild ventricular prominence related to global parenchymal volume loss without hydrocephalus. No extra-axial fluid collection. Pituitary gland and suprasellar region within normal limits. Midline structures intact and normal. No abnormal enhancement. No findings to suggest meningitis or other CNS infection. Vascular: Major  intracranial vascular flow voids are maintained. Skull and upper cervical spine: Degenerative thickening noted about the tectorial membrane without significant stenosis. Craniocervical junction otherwise unremarkable. Bone marrow signal intensity within normal limits. No focal marrow replacing lesion. No scalp soft tissue abnormality. Sinuses/Orbits: Globes and orbital soft tissues within normal limits. Mild scattered mucosal thickening noted within the ethmoidal air cells. Paranasal sinuses are otherwise clear. Small right greater than left mastoid effusions noted. Visualized nasopharynx unremarkable. Inner ear structures grossly within normal limits. Other: None. IMPRESSION: 1. No acute intracranial abnormality. Specifically, no MRI evidence for encephalitis, paraneoplastic disease, or intracranial infection. 2. 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 No associated mass effect. This is felt to be incidental in nature and of doubtful significance. 3. Otherwise normal brain MRI for age. 4. Small right greater than left mastoid effusions. Electronically Signed   By: Jeannine Boga M.D.   On: 06/13/2021 02:30   MR THORACIC SPINE W WO CONTRAST  Result Date: 06/13/2021 CLINICAL DATA:  Initial evaluation for myelopathy, acute or progressive. EXAM: MRI THORACIC AND LUMBAR SPINE WITHOUT AND WITH CONTRAST TECHNIQUE: Multiplanar and multiecho pulse sequences of the thoracic and lumbar spine were obtained without and with intravenous contrast. CONTRAST:  6.29m GADAVIST GADOBUTROL 1 MMOL/ML IV SOLN COMPARISON:  None available. FINDINGS: MRI THORACIC SPINE FINDINGS Alignment: Mild dextroscoliosis. Alignment otherwise normal with preservation of the normal thoracic kyphosis. No listhesis. Vertebrae: Vertebral body height maintained without acute or chronic fracture. Bone marrow signal intensity diffusely heterogeneous without worrisome  osseous lesion. Mild edema noted within the spinous processes of T4 and T5, of uncertain etiology or significance (series 1, images 7, 6). No visible discrete osseous lesion, fracture, or other abnormality evident by MRI. The surrounding soft tissues are within normal limits. Mild edema and enhancement noted within the inferior endplate of T624THL favored discogenic and reactive in nature. No other abnormal marrow edema or enhancement within the thoracic spine. Cord: Normal signal and morphology. No abnormal enhancement. No epidural collections. Paraspinal and other soft tissues: Visualized paraspinous soft tissues demonstrate no acute finding. Small layering bilateral pleural effusions noted, left slightly larger than right. 5.2 cm exophytic cyst noted extending from the left kidney. Disc levels: T4-5: Small central disc protrusion indents the ventral thecal sac (series 22, image 14). Posterior element hypertrophy. No significant stenosis. T6-7: Small central disc protrusion indents the ventral thecal sac (series 22, image 20). Mild flattening of the ventral cord without cord signal changes. Left-sided facet hypertrophy. No spinal stenosis. T7-8: Small central disc protrusion indents the ventral thecal sac (series 22, image 23). Mild flattening of the ventral cord without cord signal changes. Mild posterior element hypertrophy. No significant stenosis. T12-L1: Diffuse disc bulge with bilateral facet hypertrophy. No significant spinal stenosis. Mild bilateral foraminal narrowing. No other significant disc pathology within the thoracic spine for age. Multilevel facet hypertrophy noted throughout the mid and lower thoracic spine. No significant  spinal stenosis. MRI LUMBAR SPINE FINDINGS Segmentation:  Examination degraded by motion artifact. Standard segmentation. Lowest well-formed disc space labeled the L5-S1 level. Alignment: 3 mm anterolisthesis of L3 on L4, with trace retrolisthesis of L4 on L5. Findings chronic and  facet mediated. Underlying levoscoliosis. Vertebrae: Acute compression fracture seen involving the superior endplate of L1. Associated height loss measures up to 35% without significant bony retropulsion. Additionally, there is a suspected left sacral fracture, seen only on sagittal projection (series 2, image 14). Otherwise, vertebral body height maintained with no other acute or chronic fracture. Visualized bone marrow signal intensity diffusely heterogeneous without discrete worrisome osseous lesion. No other abnormal marrow edema or enhancement. Conus medullaris: Extends to the T12-L1 level and appears normal. No visible abnormal enhancement on this motion degraded exam. Paraspinal and other soft tissues: Paraspinous soft tissues demonstrate no acute finding. Prominent distension of the partially visualized urinary bladder noted. Disc levels: L1-2: Disc bulge with disc desiccation and intervertebral disc space narrowing. Mild to moderate bilateral facet hypertrophy. Resultant moderate spinal stenosis, with mild to moderate bilateral L1 foraminal narrowing. L2-3: Mild disc bulge. Moderate bilateral facet hypertrophy. No significant spinal stenosis. Foramina remain patent. L3-4: Anterolisthesis. Degenerative intervertebral disc space narrowing with diffuse disc bulge and disc desiccation. Disc bulging eccentric to the right. Moderate bilateral facet hypertrophy. Resultant moderate spinal stenosis. Mild to moderate right with mild left L3 foraminal narrowing. L4-5: Disc bulge with disc desiccation. Moderate bilateral facet hypertrophy. Mild narrowing of the lateral recesses bilaterally. Central canal remains patent. Mild to moderate left L4 foraminal narrowing. Right neural foramen remains patent. L5-S1: Degenerative intervertebral disc space narrowing with disc desiccation and diffuse disc bulge. Reactive endplate spurring. Mild left greater than right facet hypertrophy. No significant spinal stenosis. Foramina  remain patent. IMPRESSION: 1. Acute compression fracture involving the superior endplate of L1 with up to 35% height loss without significant bony retropulsion. 2. Additional probable acute fracture of the left sacrum, partially visualized. Follow-up with dedicated CT and/or MRI of the sacrum could be performed for further evaluation as warranted. 3. Mild edema within the spinous processes of T4 and T5, of uncertain etiology, but could reflect additional subtle fractures given the above findings. Correlation with physical exam for possible pain at this location recommended. 4. Normal MRI appearance of the thoracic spinal cord, conus medullaris, and cauda equina. No cord signal changes to suggest myelopathy. No abnormal enhancement. 5. Moderate lumbar spondylosis with resultant moderate spinal stenosis at L1-2 and L3-4. Additional more mild spondylosis elsewhere within the thoracolumbar spine as above. No other significant stenosis or neural impingement. 6. Prominent distension of the urinary bladder. Clinical correlation for possible urinary retention recommended. Electronically Signed   By: Jeannine Boga M.D.   On: 06/13/2021 03:17   MR Lumbar Spine W Wo Contrast  Result Date: 06/13/2021 CLINICAL DATA:  Initial evaluation for myelopathy, acute or progressive. EXAM: MRI THORACIC AND LUMBAR SPINE WITHOUT AND WITH CONTRAST TECHNIQUE: Multiplanar and multiecho pulse sequences of the thoracic and lumbar spine were obtained without and with intravenous contrast. CONTRAST:  6.72m GADAVIST GADOBUTROL 1 MMOL/ML IV SOLN COMPARISON:  None available. FINDINGS: MRI THORACIC SPINE FINDINGS Alignment: Mild dextroscoliosis. Alignment otherwise normal with preservation of the normal thoracic kyphosis. No listhesis. Vertebrae: Vertebral body height maintained without acute or chronic fracture. Bone marrow signal intensity diffusely heterogeneous without worrisome osseous lesion. Mild edema noted within the spinous  processes of T4 and T5, of uncertain etiology or significance (series 1, images 7, 6). No visible discrete  osseous lesion, fracture, or other abnormality evident by MRI. The surrounding soft tissues are within normal limits. Mild edema and enhancement noted within the inferior endplate of 624THL, favored discogenic and reactive in nature. No other abnormal marrow edema or enhancement within the thoracic spine. Cord: Normal signal and morphology. No abnormal enhancement. No epidural collections. Paraspinal and other soft tissues: Visualized paraspinous soft tissues demonstrate no acute finding. Small layering bilateral pleural effusions noted, left slightly larger than right. 5.2 cm exophytic cyst noted extending from the left kidney. Disc levels: T4-5: Small central disc protrusion indents the ventral thecal sac (series 22, image 14). Posterior element hypertrophy. No significant stenosis. T6-7: Small central disc protrusion indents the ventral thecal sac (series 22, image 20). Mild flattening of the ventral cord without cord signal changes. Left-sided facet hypertrophy. No spinal stenosis. T7-8: Small central disc protrusion indents the ventral thecal sac (series 22, image 23). Mild flattening of the ventral cord without cord signal changes. Mild posterior element hypertrophy. No significant stenosis. T12-L1: Diffuse disc bulge with bilateral facet hypertrophy. No significant spinal stenosis. Mild bilateral foraminal narrowing. No other significant disc pathology within the thoracic spine for age. Multilevel facet hypertrophy noted throughout the mid and lower thoracic spine. No significant spinal stenosis. MRI LUMBAR SPINE FINDINGS Segmentation:  Examination degraded by motion artifact. Standard segmentation. Lowest well-formed disc space labeled the L5-S1 level. Alignment: 3 mm anterolisthesis of L3 on L4, with trace retrolisthesis of L4 on L5. Findings chronic and facet mediated. Underlying levoscoliosis.  Vertebrae: Acute compression fracture seen involving the superior endplate of L1. Associated height loss measures up to 35% without significant bony retropulsion. Additionally, there is a suspected left sacral fracture, seen only on sagittal projection (series 2, image 14). Otherwise, vertebral body height maintained with no other acute or chronic fracture. Visualized bone marrow signal intensity diffusely heterogeneous without discrete worrisome osseous lesion. No other abnormal marrow edema or enhancement. Conus medullaris: Extends to the T12-L1 level and appears normal. No visible abnormal enhancement on this motion degraded exam. Paraspinal and other soft tissues: Paraspinous soft tissues demonstrate no acute finding. Prominent distension of the partially visualized urinary bladder noted. Disc levels: L1-2: Disc bulge with disc desiccation and intervertebral disc space narrowing. Mild to moderate bilateral facet hypertrophy. Resultant moderate spinal stenosis, with mild to moderate bilateral L1 foraminal narrowing. L2-3: Mild disc bulge. Moderate bilateral facet hypertrophy. No significant spinal stenosis. Foramina remain patent. L3-4: Anterolisthesis. Degenerative intervertebral disc space narrowing with diffuse disc bulge and disc desiccation. Disc bulging eccentric to the right. Moderate bilateral facet hypertrophy. Resultant moderate spinal stenosis. Mild to moderate right with mild left L3 foraminal narrowing. L4-5: Disc bulge with disc desiccation. Moderate bilateral facet hypertrophy. Mild narrowing of the lateral recesses bilaterally. Central canal remains patent. Mild to moderate left L4 foraminal narrowing. Right neural foramen remains patent. L5-S1: Degenerative intervertebral disc space narrowing with disc desiccation and diffuse disc bulge. Reactive endplate spurring. Mild left greater than right facet hypertrophy. No significant spinal stenosis. Foramina remain patent. IMPRESSION: 1. Acute  compression fracture involving the superior endplate of L1 with up to 35% height loss without significant bony retropulsion. 2. Additional probable acute fracture of the left sacrum, partially visualized. Follow-up with dedicated CT and/or MRI of the sacrum could be performed for further evaluation as warranted. 3. Mild edema within the spinous processes of T4 and T5, of uncertain etiology, but could reflect additional subtle fractures given the above findings. Correlation with physical exam for possible pain at this location  recommended. 4. Normal MRI appearance of the thoracic spinal cord, conus medullaris, and cauda equina. No cord signal changes to suggest myelopathy. No abnormal enhancement. 5. Moderate lumbar spondylosis with resultant moderate spinal stenosis at L1-2 and L3-4. Additional more mild spondylosis elsewhere within the thoracolumbar spine as above. No other significant stenosis or neural impingement. 6. Prominent distension of the urinary bladder. Clinical correlation for possible urinary retention recommended. Electronically Signed   By: Jeannine Boga M.D.   On: 06/13/2021 03:17     Imaging independently reviewed in Epic.    [facility] [phone] pager 06/13/2021, 12:44 PM  I spent greater than 35 minutes with the patient including greater than 50% of time in face to face counsel of the patient and in coordination of their care.

## 2021-06-14 ENCOUNTER — Inpatient Hospital Stay (HOSPITAL_COMMUNITY): Payer: Medicare Other

## 2021-06-14 LAB — COMPREHENSIVE METABOLIC PANEL
ALT: 22 U/L (ref 0–44)
AST: 17 U/L (ref 15–41)
Albumin: 2.9 g/dL — ABNORMAL LOW (ref 3.5–5.0)
Alkaline Phosphatase: 113 U/L (ref 38–126)
Anion gap: 7 (ref 5–15)
BUN: 17 mg/dL (ref 8–23)
CO2: 21 mmol/L — ABNORMAL LOW (ref 22–32)
Calcium: 9.1 mg/dL (ref 8.9–10.3)
Chloride: 102 mmol/L (ref 98–111)
Creatinine, Ser: 0.83 mg/dL (ref 0.61–1.24)
GFR, Estimated: >60 mL/min (ref >60)
Glucose, Bld: 104 mg/dL — ABNORMAL HIGH (ref 70–99)
Potassium: 4.3 mmol/L (ref 3.5–5.1)
Sodium: 130 mmol/L — ABNORMAL LOW (ref 135–145)
Total Bilirubin: 1.3 mg/dL — ABNORMAL HIGH (ref 0.3–1.2)
Total Protein: 5.7 g/dL — ABNORMAL LOW (ref 6.5–8.1)

## 2021-06-14 LAB — CBC
HCT: 31.5 % — ABNORMAL LOW (ref 39.0–52.0)
Hemoglobin: 11.1 g/dL — ABNORMAL LOW (ref 13.0–17.0)
MCH: 34 pg (ref 26.0–34.0)
MCHC: 35.2 g/dL (ref 30.0–36.0)
MCV: 96.6 fL (ref 80.0–100.0)
Platelets: 147 10*3/uL — ABNORMAL LOW (ref 150–400)
RBC: 3.26 MIL/uL — ABNORMAL LOW (ref 4.22–5.81)
RDW: 13.3 % (ref 11.5–15.5)
WBC: 7 10*3/uL (ref 4.0–10.5)
nRBC: 0 % (ref 0.0–0.2)

## 2021-06-14 LAB — LUPUS ANTICOAGULANT PANEL
DRVVT: 49.2 s — ABNORMAL HIGH (ref 0.0–47.0)
PTT Lupus Anticoagulant: 37 s (ref 0.0–51.9)

## 2021-06-14 LAB — DRVVT CONFIRM: dRVVT Confirm: 1.1 ratio (ref 0.8–1.2)

## 2021-06-14 LAB — LYME DISEASE DNA BY PCR(BORRELIA BURG): Lyme Disease(B.burgdorferi)PCR: NEGATIVE

## 2021-06-14 LAB — VITAMIN B12: Vitamin B-12: 998 pg/mL — ABNORMAL HIGH (ref 180–914)

## 2021-06-14 LAB — DRVVT MIX: dRVVT Mix: 43 s — ABNORMAL HIGH (ref 0.0–40.4)

## 2021-06-14 MED ORDER — IOHEXOL 350 MG/ML SOLN
75.0000 mL | Freq: Once | INTRAVENOUS | Status: AC | PRN
  Administered 2021-06-14: 75 mL via INTRAVENOUS

## 2021-06-14 MED ORDER — TAMSULOSIN HCL 0.4 MG PO CAPS
0.4000 mg | ORAL_CAPSULE | Freq: Every day | ORAL | Status: DC
  Administered 2021-06-14 – 2021-06-25 (×12): 0.4 mg via ORAL
  Filled 2021-06-14 (×12): qty 1

## 2021-06-14 MED ORDER — IOHEXOL 9 MG/ML PO SOLN
ORAL | Status: AC
  Administered 2021-06-14: 500 mL
  Filled 2021-06-14: qty 1000

## 2021-06-14 NOTE — Progress Notes (Signed)
Neurology Progress Note  Brief HPI: 87 y.o. 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 Previous ED evaluation on 8/14 patient was diagnosed with possible shingles for a rash on the RLE and started on valacyclovir. His weakness continued to worsen with bilateral R > L lower extremity weakness and neurology was consulted for further evaluation of weakness.   Subjective: No acute overnight events  Continues to have right > left lower extremity weakness affecting ambulation   Exam: Vitals:   06/14/21 1637 06/14/21 1953  BP: (!) 146/79 120/72  Pulse: 68   Resp: 16 18  Temp: 97.7 F (36.5 C) (!) 97.3 F (36.3 C)  SpO2: 92%    Gen: Sitting up in bedside recliner, in no acute distress Resp: non-labored breathing, no respiratory distress on room air Abd: soft, non-tender, non-distended Extremities: The right lower extremity is notably more edematous than the left Spine: There is no tenderness to palpation along the entirety of the spine Skin: In addition to the rash as described on the right lower extremity, there are several spots in the left upper thigh as well as just above the sacrum that appear similar.  Neuro: Mental Status: Awake, alert, and oriented to person, place, time, and situation He is able to provide some details about the history of his present illness but replies "I don't know" to many questions Speech is intact without dysarthria.  No aphasia or neglect noted in casual conversation Cranial Nerves: PERRL, EOMI without ptosis, facial sensation intact and symmetric to light touch, face is has a slight right droop at rest and with activation, hearing is intact to voice, shoulders shrug symmetrically, phonation is normal, palate rises symmetrically, tongue  protrudes midline.  Motor: At rest he makes many adventitious movements of all 4 extremities that he reports is "just loosening up" he has some slight paratonia on tone testing but no clear spasticity.  5/5 in the left upper extremity, very subtle weakness 4++/5 in the right upper extremity.  When laying in bed he cannot lift the right hip antigravity, but seated on the side of the bed he is 4 -/5 in the right hip flexion, 4+/5 in the left hip flexion.  He is otherwise 4+/5 in knee flexion and extension on the right and 5/5 in knee flexion and extension on the left. Sensory: He has a length dependent loss of pinprick and cold temperature in the bilateral arms and legs, as well as a severe allodynia in the bilateral lower extremities below the knees.  He has diminished vibration sensation (absent at the toes, 6 seconds at the knees, 9 seconds at the hands).  Additionally to sharp sensation tested along the trunk, he appears to have approximately a T8 sensory level on the back and T5 in the front  DTR: 2+ and symmetric biceps, 1+ and symmetric patellae, stable Gait: Patient seated at the side of the bed but reported he did not feel safe ambulating with only 1 person assist and therefore this was deferred  Pertinent Labs: CBC    Component Value Date/Time   WBC 7.0 06/14/2021 0111   RBC 3.26 (L) 06/14/2021 0111   HGB 11.1 (L) 06/14/2021 0111   HGB 13.0 11/10/2019 1246   HCT 31.5 (L) 06/14/2021 0111   HCT 38.9 11/10/2019 1246  PLT 147 (L) 06/14/2021 0111   PLT 142 (L) 11/10/2019 1246   MCV 96.6 06/14/2021 0111   MCV 102 (H) 11/10/2019 1246   MCH 34.0 06/14/2021 0111   MCHC 35.2 06/14/2021 0111   RDW 13.3 06/14/2021 0111   RDW 12.1 11/10/2019 1246   LYMPHSABS 0.9 06/12/2021 1455   LYMPHSABS 1.2 12/23/2016 0804   MONOABS 0.6 06/12/2021 1455   EOSABS 0.2 06/12/2021 1455   EOSABS 0.4 12/23/2016 0804   BASOSABS 0.0 06/12/2021 1455   BASOSABS 0.0 12/23/2016 0804   CMP     Component Value  Date/Time   NA 130 (L) 06/14/2021 0111   NA 139 11/10/2019 1246   K 4.3 06/14/2021 0111   CL 102 06/14/2021 0111   CO2 21 (L) 06/14/2021 0111   GLUCOSE 104 (H) 06/14/2021 0111   BUN 17 06/14/2021 0111   BUN 12 11/10/2019 1246   CREATININE 0.83 06/14/2021 0111   CREATININE 0.96 12/19/2015 0825   CALCIUM 9.1 06/14/2021 0111   PROT 5.7 (L) 06/14/2021 0111   PROT 6.5 12/23/2016 0804   ALBUMIN 2.9 (L) 06/14/2021 0111   ALBUMIN 4.1 12/23/2016 0804   AST 17 06/14/2021 0111   ALT 22 06/14/2021 0111   ALKPHOS 113 06/14/2021 0111   BILITOT 1.3 (H) 06/14/2021 0111   BILITOT 1.5 (H) 12/23/2016 0804   GFRNONAA >60 06/14/2021 0111   GFRAA >60 01/12/2020 0117   HIV 06/12/2021: Non Reactive Lyme disease serology: Negative  Results for RYEL, FIORAVANTI (MRN [id number]) as of 06/13/2021 09:15  Ref. Range 06/12/2021 13:34 06/12/2021 13:35  Appearance, CSF Latest Ref Range: CLEAR  CLEAR (A)   Glucose, CSF Latest Ref Range: 40 - 70 mg/dL  55  RBC Count, CSF Latest Ref Range: 0 /cu mm 25 (H)   WBC, CSF Latest Ref Range: 0 - 5 /cu mm 69 (HH)   Segmented Neutrophils-CSF Latest Ref Range: 0 - 6 % 0   Lymphs, CSF Latest Ref Range: 40 - 80 % 89 (H)   Monocyte-Macrophage-Spinal Fluid Latest Ref Range: 15 - 45 % 11 (L)   Color, CSF Latest Ref Range: COLORLESS  COLORLESS   Supernatant Unknown NOT INDICATED   Total  Protein, CSF Latest Ref Range: 15 - 45 mg/dL  75 (H)  Tube # Unknown 1    Results for ZEPLIN, GIANELLI (MRN [id number]) as of 06/13/2021 09:15  Ref. Range 06/11/2021 14:06  Anti Nuclear Antibody (ANA) Latest Ref Range: Negative  Negative  ds DNA Ab Latest Ref Range: 0 - 9 IU/mL <1  RA Latex Turbid. Latest Ref Range: <14.0 IU/mL <10.0  Cytoplasmic (C-ANCA) Latest Ref Range: Neg:<1:20 titer <1:20  P-ANCA Latest Ref Range: Neg:<1:20 titer <1:20  Atypical P-ANCA titer Latest Ref Range: Neg:<1:20 titer <1:20  ENA SM Ab Ser-aCnc Latest Ref Range: 0.0 - 0.9 AI <0.2  Anti-MPO Antibodies Latest Ref  Range: 0.0 - 0.9 units <0.2  Anti-PR3 Antibodies Latest Ref Range: 0.0 - 0.9 units <0.2  C1INH SerPl-mCnc Latest Ref Range: 21 - 39 mg/dL 25  C3 Complement Latest Ref Range: 82 - 167 mg/dL 107  Compl, Total (CH50) Latest Ref Range: >41 U/mL 50  Complement C4, Body Fluid Latest Ref Range: 12 - 38 mg/dL 21  Lyme Total Antibody EIA Latest Ref Range: Negative  Negative  SSA (Ro) (ENA) Antibody, IgG Latest Ref Range: 0.0 - 0.9 AI <0.2  SSB (La) (ENA) Antibody, IgG Latest Ref Range: 0.0 - 0.9 AI <0.2  Hep A Ab, IgM  Latest Ref Range: NON REACTIVE  NON REACTIVE  Hepatitis B Surface Ag Latest Ref Range: NON REACTIVE  NON REACTIVE  Hep B Core Ab, IgM Latest Ref Range: NON REACTIVE  NON REACTIVE  HCV Ab Latest Ref Range: NON REACTIVE  NON REACTIVE  HIV Screen 4th Generation wRfx Latest Ref Range: Non Reactive  Non Reactive   CSF culture pending CSF gram stain: no organisms seen, WBC present, predominantly mononuclear Cryptococcal antigen CSF negative CSF cytology -- negative for malignant cells  Lab Results  Component Value Date   ESRSEDRATE 15 06/10/2021   Lab Results  Component Value Date   CRP 0.6 06/10/2021   Lab Results  Component Value Date   VITAMINB12 998 (H) 06/14/2021     Pertinent imaging:  MRI brain [facility] 06/13/2021: 1. No acute intracranial abnormality. Specifically, no MRI evidence for encephalitis, paraneoplastic disease, or intracranial infection. 2. 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 No associated mass effect. This is felt to be incidental in nature and of doubtful significance. 3. Otherwise normal brain MRI for age. 4. Small right greater than left mastoid effusions.  MRA head WO contrast 06/13/2021: Normal intracranial MRA. No MRI evidence for CNS vasculitis.  MRI thoracic and lumbar spine WWO contrast 06/13/2021: 1. Acute compression fracture involving the superior endplate of L1  with up to 35% height loss without significant bony retropulsion. 2. Additional probable acute fracture of the left sacrum, partially visualized. Follow-up with dedicated CT and/or MRI of the sacrum could be performed for further evaluation as warranted. 3. Mild edema within the spinous processes of T4 and T5, of uncertain etiology, but could reflect additional subtle fractures given the above findings. Correlation with physical exam for possible pain at this location recommended. 4. Normal MRI appearance of the thoracic spinal cord, conus medullaris, and cauda equina. No cord signal changes to suggest myelopathy. No abnormal enhancement. 5. Moderate lumbar spondylosis with resultant moderate spinal stenosis at L1-2 and L3-4. Additional more mild spondylosis elsewhere within the thoracolumbar spine as above. No other significant stenosis or neural impingement. 6. Prominent distension of the urinary bladder. Clinical correlation for possible urinary retention recommended.  CT C/A/P w/ contrast Musculoskeletal: L1 superior endplate subacute compression fracture deformity with approximally the 30% loss of height anteriorly, no retropulsion. Spondylitic changes in the lower lumbar spine. Left hip arthroplasty hardware resulting in streak artifact degrading portions of the scan. Right hip DJD. IMPRESSION: 1. No mass or adenopathy. 2. Marked prostate enlargement with distended urinary bladder, which may account for the bilateral hydronephrosis and ureterectasis in the absence of urolithiasis.  3. Descending and sigmoid diverticulosis 4. 5 mm right lower lobe pulmonary nodule. No follow-up needed if patient is low-risk. Non-contrast chest CT can be considered in 12 months if patient is high-risk. This recommendation follows the consensus statement: Guidelines for Management of Incidental Pulmonary Nodules Detected on CT Images: From the Fleischner Society 2017; Radiology 2017; 284:228-243.  Assessment:  87 year old male with history of squamous cell carcinoma of back status-post resection, atrial fibrillation on Eliquis who has had worsening right more than left lower extremity weakness over 3 weeks as well as right lower extremity rash.  Today my examination is notable for stable strength testing, sensory testing that is challenging to localize (possible spinal cord level, length dependent polyneuropathy, possibly multiple overlaid processes)  At this time he is being empirically treated for VZV vasculitis, although his rash is felt to be  fairly atypical for VZV.  He has had extensive serological work-up for other etiologies of vasculitis which has been negative to date.  Lymphocytic pleocytosis could be secondary to a viral infection or far more rare but more concerning a process such as lymphoma.  Paraneoplastic process is also on the differential, although work-up for malignancy has been revealing only for a lung nodule and markedly enlarged prostate.  He has also been found to be fairly osteopenic with an subacute to acute L-spine compression fracture though fortunately he has minimal tenderness to palpation  While initially our service was considering a trial of steroid therapy, given the patient's age, comorbidities (especially osteopenia and fractures), as well as his clinical stability in the last few days, I am uncertain that the risks of steroids would outweigh the benefits at this time.  Given the work-up to date, I believe the next best step will be review of the data with patient and family as well as primary team  Impression:  Bilateral lower extremity paresis, stable Right lower extremity rash, improving Acute L1 compression fracture, possible subtle fractures at T4/T5 Acute possible fraction of the left sacrum Hyponatremia, stable Markedly enlarged prostate with distended bladder Lung nodule  Recommendations:  # Concern for vasculitis  - Follow up on pending labs: RPR, protein  electrophoresis, lupus anticoagulant panel, Lyme disease DNA by PCR; CSF: IgG, VZV PCR, HSV 1/2 PCR, Oligoclonal bands, CSF culture, VZV IgG send out  - Continue pregabalin 50 mg BID - We will again hold Eliquis at this time, given further work-up including repeat lumbar puncture for flow cytometry may be needed, though again given his clinical stability it may be prudent to hold off on this at this time and only repeat the lumbar puncture if he has further worsening  # Concern for occult malignancy - CT chest abdomen pelvis for malignancy screening notable for 5 mm right lower lobe pulmonary nodule, and marked prostate enlargement  # Possible thoracic and sacral fractures - MRI pelvis  # Unilateral RLE edema, concern for DVT  - Lower extremity duplex to evaluate for potential DVT  Lesleigh Noe MD-PhD [facility] [phone] Available 7 PM to 7 AM, outside of these hours please call Neurologist on call as listed on Amion.  Greater than 35 minutes were spent in direct care of this patient today, the majority of which was spent in detailed examination of the patient to confirm clinical stability as documented above

## 2021-06-14 NOTE — Progress Notes (Addendum)
PROGRESS NOTE        PATIENT DETAILS Name: Parker Odonnell. Age: 87 y.o. Sex: male Date of Birth: 09/23/1932 Admit Date: 06/10/2021 Admitting Physician Evalee Mutton Kristeen Mans, MD [id number], Dwyane Luo, MD  Brief Narrative: Patient is a 87 y.o. male persistent atrial fibrillation on Eliquis, HFrEF, HTN who presented to the hospital with right leg weakness and a purpuric right leg rash.  Given broad differential diagnosis (infectious/autoimmune/paraneoplastic/structural)-admitted to the [facility] service for further evaluation.  Work-up in progress-see below for results-ID/neurology following.  Significant events: 8/14>> presented to ED with right leg weakness/pain-fall-found to have rash-thought to have shingles-started on Valtrex and discharged  8/23>> admit to [facility] for right leg weakness/purpuric right leg rash eval/management 8/24>> punch biopsy by surgery 8/25>> LP-CSF w leukocytosis (lymphocytes) and elevated protein-Acyclovir started.  Significant radiological studies:  8/14>> CT head: No acute intracranial findings. 8/25>> MRI brain: No evidence of encephalitis/paraneoplastic disease/intracranial infection. 8/25>> MRA brain: No evidence of CNS vasculitis-no major stenosis. 8/25>> MRI thoracic/lumbar-spine: Compression fracture superior endplate of L1, acute fracture of left sacrum, mild edema within spinous process of T4/T5.  No MRI changes to suggest myelopathy. 8/27>> CT chest: 5 mm nodule in right major fissure-possibly intrapulmonary lymph node 8/27>> CT abdomen: No mass/adenopathy-Marked prostate enlargement-mild bilateral hydronephrosis.  Significant serological studies: 8/23>> TSH: Within normal limit 8/24>> RA factor: Negative 8/24>> ANA: Negative 8/24>> ds DNA antibody/anti-smooth antibody: Negative 8/24>> Sjogren's A/Sjogren's B antibody: Negative 8/24>> anti-smooth muscle antibody: Negative 8/24>> ANCA antibodies: Negative 8/24>> cardiolipin  antibodies: Negative 8/24>> Lyme antibody/PCR: Negative 8/24>> C3/C4/total complement: Normal limits 8/24>> C1 esterase inhibitor: Normal limit 8/24>> cryoglobulin: Pending 8/24>> SPEP/UPEP: Pending 8/24>> Lupus anticoagulant: Pending 8/25>>CSF Oligoclonal bands/IgG index:pending 8/27>>Vitamin B12:pending  Microbiology data: 8/23>> COVID PCR: Negative 8/23>> blood culture: No growth 8/24>> acute hepatitis panel: Negative 8/24>> HIV: Negative 8/25>> blood culture: No growth 8/25>> CSF cryptococcal antigen: Negative 8/25>> CSF culture: Pending 8/25>> CSF VDRL: Pending 8/25>> CSF VZV PCR: Pending 8/25>> CSF HSV PCR: Pending 8/25>>CSF ARUP Encephalitis panel:Pending 8/26>>RPR:Non reactive  Pathology data: 8/24>> skin biopsy: Pending 8/25>> CSF cytology: negative for malignancy  Antimicrobial therapy: Acyclovir: 8/25>>  Procedures : 8/24>> punch biopsy performed by general surgery: Pending biopsy results 8/25>> lumbar puncture by neurology  Consults: Neuro, ID, general surgery  DVT Prophylaxis : Eliquis on hold-neurology contemplating repeat LP for flow cytometry.  Subjective: Right leg weakness and rash appears unchanged.  No other issues overnight.   Assessment/Plan: Right leg weakness/purpuric right lower extremity rash: Etiology unclear-differentials include infectious/vasculitis/paraneoplastic-extensive work-up in process.  Imaging studies so far unrevealing.  Autoimmune work-up negative so far.  Skin biopsy results still pending.  No masses or lymphadenopathy is seen on CT chest/abdomen.  Given lymphocytic pleocytosis and elevated protein on CSF studies-empirically on acyclovir till HSV/VZV PCR obtained.  Neurology contemplating repeat LP for flow cytometry.  Await further recommendations from ID/neurology.  Hyponatremia: Volume status is stable-although he has a history of HFrEF-I wonder if he has developed SIADH physiology.  He is on low-dose IV fluid because he is  on acyclovir.  Continue to follow sodium levels closely-if they drop significantly-we will place patient on Lasix.  CT chest without any lung mass-although has a 5 mm lung nodule versus intrapulmonary lymph node.    Persistent atrial fibrillation: Rate controlled-on Coreg-Eliquis held by neurology anticipation of repeat lumbar puncture.    HFrEF: Volume status  is stable-continue to hold diuretics for now.  History of CAD-s/p remote PCI: No anginal symptoms.  HLD: Continue statin  HTN: BP stable-continue losartan/Coreg-follow and adjust.  Large prostate with mild hydronephrosis seen on CT abdomen: Renal function stable-starting on low-dose Flomax  L1 superior endplate compression fracture/mild edema within spinous process of T4/T5: Seen incidentally on MRI imaging.  Supportive care.  5 mm right lower lobe pulmonary nodule versus intrapulmonary lymph node: Repeat noncontrast CT chest in 12 months to reassess.  Diet: Diet Order             Diet regular Room service appropriate? Yes; Fluid consistency: Thin  Diet effective now                    Code Status:  DNR  Family Communication: Daughter-Andrea-[phone]-updated over the phone on 8/27.  Disposition Plan: Status is: Inpatient  The patient will require care spanning > 2 midnights and should be moved to inpatient because: Inpatient level of care appropriate due to severity of illness  Dispo: The patient is from: Home              Anticipated d/c is to: SNF              Patient currently is not medically stable to d/c.   Difficult to place patient No     Barriers to Discharge: Right lower extremity weakness-rash of unknown etiology-work-up in progress  Antimicrobial agents: Anti-infectives (From admission, onward)    Start     Dose/Rate Route Frequency Ordered Stop   06/12/21 1815  acyclovir (ZOVIRAX) 660 mg in dextrose 5 % 100 mL IVPB        10 mg/kg  66.2 kg 113.2 mL/hr over 60 Minutes Intravenous Every  12 hours 06/12/21 1718          Time spent: 35 minutes-Greater than 50% of this time was spent in counseling, explanation of diagnosis, planning of further management, and coordination of care.  MEDICATIONS: Scheduled Meds:  atorvastatin  40 mg Oral QHS   carvedilol  3.125 mg Oral BID WC   docusate sodium  100 mg Oral BID   feeding supplement  237 mL Oral BID BM   losartan  50 mg Oral Daily   multivitamin with minerals  1 tablet Oral Daily   mupirocin cream   Topical BID   pregabalin  50 mg Oral BID   Continuous Infusions:  acyclovir 660 mg (06/14/21 0845)   PRN Meds:.acetaminophen **OR** acetaminophen, bisacodyl, hydrALAZINE, HYDROcodone-acetaminophen, morphine injection, ondansetron **OR** ondansetron (ZOFRAN) IV, polyethylene glycol, polyvinyl alcohol, traMADol   PHYSICAL EXAM: Vital signs: Vitals:   06/13/21 2359 06/14/21 0407 06/14/21 0803 06/14/21 1229  BP: (!) 124/104 124/67 136/85 (!) 114/53  Pulse: 69 72 71 68  Resp: '18 18 18 16  '$ Temp: 97.8 F (36.6 C) 98.6 F (37 C) 97.8 F (36.6 C) 98.8 F (37.1 C)  TempSrc: Axillary Oral Oral Oral  SpO2: 98% 97% 95%   Weight:  67 kg     Filed Weights   06/14/21 0407  Weight: 67 kg   Body mass index is 22.46 kg/m.   Gen Exam:Alert awake-not in any distress HEENT:atraumatic, normocephalic Chest: B/L clear to auscultation anteriorly CVS:S1S2 regular Abdomen:soft non tender, non distended Extremities:no edema Neurology: RLE weakness remains unchanged Skin: Purpuric rash remains unchanged in RLE  I have personally reviewed following labs and imaging studies  LABORATORY DATA: CBC: Recent Labs  Lab 06/10/21 1310 06/11/21 0143 06/12/21 0149 06/12/21  1455 06/13/21 0224 06/14/21 0111  WBC 5.7 5.9 5.3 5.3 6.0 7.0  NEUTROABS 4.0  --   --  3.6  --   --   HGB 13.7 13.0 11.5* 12.4* 11.3* 11.1*  HCT 39.6 36.9* 33.3* 36.4* 33.3* 31.5*  MCV 98.0 96.9 98.2 98.6 99.7 96.6  PLT 170 168 134* 150 148* 147*     Basic  Metabolic Panel: Recent Labs  Lab 06/11/21 0143 06/12/21 0149 06/12/21 1455 06/13/21 0224 06/14/21 0111  NA 125* 130* 130* 128* 130*  K 3.8 3.9 4.0 3.9 4.3  CL 97* 104 103 102 102  CO2 21* 21* 23 22 21*  GLUCOSE 116* 102* 99 119* 104*  BUN '13 13 12 13 17  '$ CREATININE 0.86 0.82 0.92 0.82 0.83  CALCIUM 9.2 9.0 9.1 8.8* 9.1     GFR: Estimated Creatinine Clearance: 60.5 mL/min (by C-G formula based on SCr of 0.83 mg/dL).  Liver Function Tests: Recent Labs  Lab 06/10/21 1310 06/12/21 1455 06/13/21 0224 06/14/21 0111  AST '26 26 23 17  '$ ALT '29 27 24 22  '$ ALKPHOS 111 111 99 113  BILITOT 2.3* 1.4* 1.9* 1.3*  PROT 7.1 6.3* 5.5* 5.7*  ALBUMIN 3.8 3.2* 3.0* 2.9*    No results for input(s): LIPASE, AMYLASE in the last 168 hours. No results for input(s): AMMONIA in the last 168 hours.  Coagulation Profile: No results for input(s): INR, PROTIME in the last 168 hours.  Cardiac Enzymes: Recent Labs  Lab 06/11/21 1406  CKTOTAL 62     BNP (last 3 results) No results for input(s): PROBNP in the last 8760 hours.  Lipid Profile: No results for input(s): CHOL, HDL, LDLCALC, TRIG, CHOLHDL, LDLDIRECT in the last 72 hours.  Thyroid Function Tests: No results for input(s): TSH, T4TOTAL, FREET4, T3FREE, THYROIDAB in the last 72 hours.   Anemia Panel: No results for input(s): VITAMINB12, FOLATE, FERRITIN, TIBC, IRON, RETICCTPCT in the last 72 hours.  Urine analysis:    Component Value Date/Time   COLORURINE YELLOW 06/10/2021 2100   APPEARANCEUR HAZY (A) 06/10/2021 2100   LABSPEC 1.008 06/10/2021 2100   PHURINE 7.0 06/10/2021 2100   GLUCOSEU NEGATIVE 06/10/2021 2100   HGBUR NEGATIVE 06/10/2021 2100   [place] NEGATIVE 06/10/2021 2100   KETONESUR NEGATIVE 06/10/2021 2100   PROTEINUR NEGATIVE 06/10/2021 2100   UROBILINOGEN 0.2 06/13/2012 1027   NITRITE NEGATIVE 06/10/2021 2100   LEUKOCYTESUR MODERATE (A) 06/10/2021 2100    Sepsis Labs: Lactic Acid, Venous No results  found for: LATICACIDVEN  MICROBIOLOGY: Recent Results (from the past 240 hour(s))  Culture, blood (routine x 2)     Status: None (Preliminary result)   Collection Time: 06/10/21 12:55 PM   Specimen: BLOOD  Result Value Ref Range Status   Specimen Description BLOOD BLOOD RIGHT FOREARM  Final   Special Requests   Final    BOTTLES DRAWN AEROBIC AND ANAEROBIC Blood Culture adequate volume   Culture   Final    NO GROWTH 4 DAYS Performed at [facility], [place] [address], [place], [place] [address]    Report Status PENDING  Incomplete  Culture, blood (routine x 2)     Status: None (Preliminary result)   Collection Time: 06/10/21  1:10 PM   Specimen: BLOOD  Result Value Ref Range Status   Specimen Description BLOOD RIGHT ANTECUBITAL  Final   Special Requests   Final    BOTTLES DRAWN AEROBIC AND ANAEROBIC Blood Culture adequate volume   Culture   Final  NO GROWTH 4 DAYS Performed at [facility], [place] [address], [place], [place] [address]    Report Status PENDING  Incomplete  SARS CORONAVIRUS 2 (TAT 6-24 HRS) Nasopharyngeal Nasopharyngeal Swab     Status: None   Collection Time: 06/10/21  7:11 PM   Specimen: Nasopharyngeal Swab  Result Value Ref Range Status   SARS Coronavirus 2 NEGATIVE NEGATIVE Final    Comment: (NOTE) SARS-CoV-2 target nucleic acids are NOT DETECTED.  The SARS-CoV-2 RNA is generally detectable in upper and lower respiratory specimens during the acute phase of infection. Negative results do not preclude SARS-CoV-2 infection, do not rule out co-infections with other pathogens, and should not be used as the sole basis for treatment or other patient management decisions. Negative results must be combined with clinical observations, patient history, and epidemiological information. The expected result is Negative.  Fact Sheet for Patients: SugarRoll.be  Fact Sheet for Healthcare  Providers: https://www.woods-mathews.com/  This test is not yet approved or cleared by the [place] FDA and  has been authorized for detection and/or diagnosis of SARS-CoV-2 by FDA under an Emergency Use Authorization (EUA). This EUA will remain  in effect (meaning this test can be used) for the duration of the COVID-19 declaration under Se ction 564(b)(1) of the Act, 21 U.S.C. section 360bbb-3(b)(1), unless the authorization is terminated or revoked sooner.  Performed at [facility], [place] [address], [place], [place] [address]   Aerobic/Anaerobic Culture w Gram Stain (surgical/deep wound)     Status: None (Preliminary result)   Collection Time: 06/11/21 11:23 AM   Specimen: Tissue  Result Value Ref Range Status   Specimen Description TISSUE  Final   Special Requests SKIN EXCISIONAL BIOPSY  Final   Gram Stain   Final    RARE WBC PRESENT,BOTH PMN AND MONONUCLEAR NO ORGANISMS SEEN    Culture   Final    NO GROWTH 3 DAYS NO ANAEROBES ISOLATED; CULTURE IN PROGRESS FOR 5 DAYS Performed at [facility], [place] [address], [place], [place] [address]    Report Status PENDING  Incomplete  CSF culture w Stat Gram Stain     Status: None (Preliminary result)   Collection Time: 06/12/21  1:36 PM   Specimen: CSF; Cerebrospinal Fluid  Result Value Ref Range Status   Specimen Description CSF  Final   Special Requests NONE  Final   Gram Stain   Final    WBC PRESENT, PREDOMINANTLY MONONUCLEAR NO ORGANISMS SEEN CYTOSPIN SMEAR    Culture   Final    NO GROWTH 2 DAYS Performed at [facility], [place] [address], [place], [place] [address]    Report Status PENDING  Incomplete  Culture, blood (routine x 2)     Status: None (Preliminary result)   Collection Time: 06/12/21  2:47 PM   Specimen: BLOOD RIGHT ARM  Result Value Ref Range Status   Specimen Description BLOOD RIGHT ARM  Final   Special Requests   Final    BOTTLES DRAWN AEROBIC AND ANAEROBIC Blood Culture  adequate volume   Culture   Final    NO GROWTH 2 DAYS Performed at [facility], [place] [address], [place], [place] [address]    Report Status PENDING  Incomplete  Culture, blood (routine x 2)     Status: None (Preliminary result)   Collection Time: 06/12/21  2:55 PM   Specimen: BLOOD LEFT ARM  Result Value Ref Range Status   Specimen Description BLOOD  LEFT ARM  Final   Special Requests   Final    BOTTLES DRAWN AEROBIC AND ANAEROBIC Blood Culture adequate volume   Culture   Final    NO GROWTH 2 DAYS Performed at [facility], [address] [place], [place] [address]    Report Status PENDING  Incomplete    RADIOLOGY STUDIES/RESULTS: MR ANGIO HEAD WO CONTRAST  Result Date: 06/13/2021 CLINICAL DATA:  Initial evaluation for CNS vasculitis, bilateral lower extremity weakness. EXAM: MRA HEAD WITHOUT CONTRAST TECHNIQUE: Angiographic images of the Circle of Willis were acquired using MRA technique without intravenous contrast. COMPARISON:  None available. FINDINGS: Anterior circulation: Examination mildly degraded by motion artifact. Visualized distal cervical segments of the internal carotid arteries are patent with antegrade flow. Petrous, cavernous, and supraclinoid segments patent without stenosis or other abnormality. A1 segments patent bilaterally. Possible short-segment fenestration at the left aspect of the anterior communicating artery complex noted. Anterior communicating artery complex otherwise unremarkable. Both anterior cerebral arteries patent to their distal aspects without stenosis. No M1 stenosis or occlusion. Normal MCA bifurcations. Distal MCA branches perfused and grossly symmetric. Posterior circulation: Left vertebral artery strongly dominant and widely patent to the vertebrobasilar junction. Left PICA patent. Hypoplastic right vertebral artery largely terminates in PICA. Basilar patent to its distal aspect without stenosis. Superior cerebellar arteries patent  bilaterally. Both PCAs primarily supplied via the basilar well perfused to their distal aspects. Anatomic variants: Hypoplastic right vertebral artery terminates in PICA. Other: No intracranial aneurysm. No significant vascular irregularity or beading to suggest CNS vasculitis, although the distal small vessels are somewhat limited assessment due to motion artifact. IMPRESSION: Normal intracranial MRA. No MRI evidence for CNS vasculitis. Electronically Signed   By: Jeannine Boga M.D.   On: 06/13/2021 02:36   MR BRAIN W WO CONTRAST  Result Date: 06/13/2021 CLINICAL DATA:  Initial evaluation for right worse than left lower extremity weakness over 3 weeks. Evaluate for CNS vasculitis versus paraneoplastic disease versus infection. EXAM: MRI HEAD WITHOUT AND WITH CONTRAST TECHNIQUE: Multiplanar, multiecho pulse sequences of the brain and surrounding structures were obtained without and with intravenous contrast. CONTRAST:  6.29m GADAVIST GADOBUTROL 1 MMOL/ML IV SOLN COMPARISON:  None available. FINDINGS: Brain: Mild diffuse prominence of the CSF containing spaces compatible generalized age-related cerebral atrophy. No significant cerebral white matter disease for age. No other focal parenchymal signal abnormality to suggest acute encephalitis, including limbic encephalitis or other paraneoplastic process. No abnormal foci of restricted of restricted diffusion to suggest acute or subacute ischemia. Gray-white matter differentiation maintained. No encephalomalacia to suggest chronic cortical infarction. No foci of susceptibility artifact to suggest acute or chronic intracranial hemorrhage. No made of a 9 mm nodular lesion along the left lateral aspect of the medulla, immediately adjacent to the foramen of Luschka (series 9, image 4). This lesion demonstrates no appreciable enhancement following contrast administration. No PICA aneurysm seen on corresponding MRA. 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 No associated mass effect. No other mass lesion, midline shift or mass effect. Mild ventricular prominence related to global parenchymal volume loss without hydrocephalus. No extra-axial fluid collection. Pituitary gland and suprasellar region within normal limits. Midline structures intact and normal. No abnormal enhancement. No findings to suggest meningitis or other CNS infection. Vascular: Major intracranial vascular flow voids are maintained. Skull and upper cervical spine: Degenerative thickening noted about the tectorial membrane without significant stenosis. Craniocervical junction otherwise unremarkable. Bone marrow signal intensity within normal limits. No focal marrow replacing lesion.  No scalp soft tissue abnormality. Sinuses/Orbits: Globes and orbital soft tissues within normal limits. Mild scattered mucosal thickening noted within the ethmoidal air cells. Paranasal sinuses are otherwise clear. Small right greater than left mastoid effusions noted. Visualized nasopharynx unremarkable. Inner ear structures grossly within normal limits. Other: None. IMPRESSION: 1. No acute intracranial abnormality. Specifically, no MRI evidence for encephalitis, paraneoplastic disease, or intracranial infection. 2. 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 No associated mass effect. This is felt to be incidental in nature and of doubtful significance. 3. Otherwise normal brain MRI for age. 4. Small right greater than left mastoid effusions. Electronically Signed   By: Jeannine Boga M.D.   On: 06/13/2021 02:30   MR THORACIC SPINE W WO CONTRAST  Result Date: 06/13/2021 CLINICAL DATA:  Initial evaluation for myelopathy, acute or progressive. EXAM: MRI THORACIC AND LUMBAR SPINE WITHOUT AND WITH CONTRAST TECHNIQUE: Multiplanar and multiecho pulse sequences of the thoracic and lumbar spine were obtained without and  with intravenous contrast. CONTRAST:  6.10m GADAVIST GADOBUTROL 1 MMOL/ML IV SOLN COMPARISON:  None available. FINDINGS: MRI THORACIC SPINE FINDINGS Alignment: Mild dextroscoliosis. Alignment otherwise normal with preservation of the normal thoracic kyphosis. No listhesis. Vertebrae: Vertebral body height maintained without acute or chronic fracture. Bone marrow signal intensity diffusely heterogeneous without worrisome osseous lesion. Mild edema noted within the spinous processes of T4 and T5, of uncertain etiology or significance (series 1, images 7, 6). No visible discrete osseous lesion, fracture, or other abnormality evident by MRI. The surrounding soft tissues are within normal limits. Mild edema and enhancement noted within the inferior endplate of [id number] favored discogenic and reactive in nature. No other abnormal marrow edema or enhancement within the thoracic spine. Cord: Normal signal and morphology. No abnormal enhancement. No epidural collections. Paraspinal and other soft tissues: Visualized paraspinous soft tissues demonstrate no acute finding. Small layering bilateral pleural effusions noted, left slightly larger than right. 5.2 cm exophytic cyst noted extending from the left kidney. Disc levels: T4-5: Small central disc protrusion indents the ventral thecal sac (series 22, image 14). Posterior element hypertrophy. No significant stenosis. T6-7: Small central disc protrusion indents the ventral thecal sac (series 22, image 20). Mild flattening of the ventral cord without cord signal changes. Left-sided facet hypertrophy. No spinal stenosis. T7-8: Small central disc protrusion indents the ventral thecal sac (series 22, image 23). Mild flattening of the ventral cord without cord signal changes. Mild posterior element hypertrophy. No significant stenosis. T12-L1: Diffuse disc bulge with bilateral facet hypertrophy. No significant spinal stenosis. Mild bilateral foraminal narrowing. No other significant  disc pathology within the thoracic spine for age. Multilevel facet hypertrophy noted throughout the mid and lower thoracic spine. No significant spinal stenosis. MRI LUMBAR SPINE FINDINGS Segmentation:  Examination degraded by motion artifact. Standard segmentation. Lowest well-formed disc space labeled the L5-S1 level. Alignment: 3 mm anterolisthesis of L3 on L4, with trace retrolisthesis of L4 on L5. Findings chronic and facet mediated. Underlying levoscoliosis. Vertebrae: Acute compression fracture seen involving the superior endplate of L1. Associated height loss measures up to 35% without significant bony retropulsion. Additionally, there is a suspected left sacral fracture, seen only on sagittal projection (series 2, image 14). Otherwise, vertebral body height maintained with no other acute or chronic fracture. Visualized bone marrow signal intensity diffusely heterogeneous without discrete worrisome osseous lesion. No other abnormal marrow edema or enhancement. Conus medullaris: Extends to the T12-L1 level and appears normal. No  visible abnormal enhancement on this motion degraded exam. Paraspinal and other soft tissues: Paraspinous soft tissues demonstrate no acute finding. Prominent distension of the partially visualized urinary bladder noted. Disc levels: L1-2: Disc bulge with disc desiccation and intervertebral disc space narrowing. Mild to moderate bilateral facet hypertrophy. Resultant moderate spinal stenosis, with mild to moderate bilateral L1 foraminal narrowing. L2-3: Mild disc bulge. Moderate bilateral facet hypertrophy. No significant spinal stenosis. Foramina remain patent. L3-4: Anterolisthesis. Degenerative intervertebral disc space narrowing with diffuse disc bulge and disc desiccation. Disc bulging eccentric to the right. Moderate bilateral facet hypertrophy. Resultant moderate spinal stenosis. Mild to moderate right with mild left L3 foraminal narrowing. L4-5: Disc bulge with disc  desiccation. Moderate bilateral facet hypertrophy. Mild narrowing of the lateral recesses bilaterally. Central canal remains patent. Mild to moderate left L4 foraminal narrowing. Right neural foramen remains patent. L5-S1: Degenerative intervertebral disc space narrowing with disc desiccation and diffuse disc bulge. Reactive endplate spurring. Mild left greater than right facet hypertrophy. No significant spinal stenosis. Foramina remain patent. IMPRESSION: 1. Acute compression fracture involving the superior endplate of L1 with up to 35% height loss without significant bony retropulsion. 2. Additional probable acute fracture of the left sacrum, partially visualized. Follow-up with dedicated CT and/or MRI of the sacrum could be performed for further evaluation as warranted. 3. Mild edema within the spinous processes of T4 and T5, of uncertain etiology, but could reflect additional subtle fractures given the above findings. Correlation with physical exam for possible pain at this location recommended. 4. Normal MRI appearance of the thoracic spinal cord, conus medullaris, and cauda equina. No cord signal changes to suggest myelopathy. No abnormal enhancement. 5. Moderate lumbar spondylosis with resultant moderate spinal stenosis at L1-2 and L3-4. Additional more mild spondylosis elsewhere within the thoracolumbar spine as above. No other significant stenosis or neural impingement. 6. Prominent distension of the urinary bladder. Clinical correlation for possible urinary retention recommended. Electronically Signed   By: Jeannine Boga M.D.   On: 06/13/2021 03:17   MR Lumbar Spine W Wo Contrast  Result Date: 06/13/2021 CLINICAL DATA:  Initial evaluation for myelopathy, acute or progressive. EXAM: MRI THORACIC AND LUMBAR SPINE WITHOUT AND WITH CONTRAST TECHNIQUE: Multiplanar and multiecho pulse sequences of the thoracic and lumbar spine were obtained without and with intravenous contrast. CONTRAST:  6.13m  GADAVIST GADOBUTROL 1 MMOL/ML IV SOLN COMPARISON:  None available. FINDINGS: MRI THORACIC SPINE FINDINGS Alignment: Mild dextroscoliosis. Alignment otherwise normal with preservation of the normal thoracic kyphosis. No listhesis. Vertebrae: Vertebral body height maintained without acute or chronic fracture. Bone marrow signal intensity diffusely heterogeneous without worrisome osseous lesion. Mild edema noted within the spinous processes of T4 and T5, of uncertain etiology or significance (series 1, images 7, 6). No visible discrete osseous lesion, fracture, or other abnormality evident by MRI. The surrounding soft tissues are within normal limits. Mild edema and enhancement noted within the inferior endplate of [id number] favored discogenic and reactive in nature. No other abnormal marrow edema or enhancement within the thoracic spine. Cord: Normal signal and morphology. No abnormal enhancement. No epidural collections. Paraspinal and other soft tissues: Visualized paraspinous soft tissues demonstrate no acute finding. Small layering bilateral pleural effusions noted, left slightly larger than right. 5.2 cm exophytic cyst noted extending from the left kidney. Disc levels: T4-5: Small central disc protrusion indents the ventral thecal sac (series 22, image 14). Posterior element hypertrophy. No significant stenosis. T6-7: Small central disc protrusion indents the ventral thecal sac (series 22, image 20). Mild  flattening of the ventral cord without cord signal changes. Left-sided facet hypertrophy. No spinal stenosis. T7-8: Small central disc protrusion indents the ventral thecal sac (series 22, image 23). Mild flattening of the ventral cord without cord signal changes. Mild posterior element hypertrophy. No significant stenosis. T12-L1: Diffuse disc bulge with bilateral facet hypertrophy. No significant spinal stenosis. Mild bilateral foraminal narrowing. No other significant disc pathology within the thoracic spine for  age. Multilevel facet hypertrophy noted throughout the mid and lower thoracic spine. No significant spinal stenosis. MRI LUMBAR SPINE FINDINGS Segmentation:  Examination degraded by motion artifact. Standard segmentation. Lowest well-formed disc space labeled the L5-S1 level. Alignment: 3 mm anterolisthesis of L3 on L4, with trace retrolisthesis of L4 on L5. Findings chronic and facet mediated. Underlying levoscoliosis. Vertebrae: Acute compression fracture seen involving the superior endplate of L1. Associated height loss measures up to 35% without significant bony retropulsion. Additionally, there is a suspected left sacral fracture, seen only on sagittal projection (series 2, image 14). Otherwise, vertebral body height maintained with no other acute or chronic fracture. Visualized bone marrow signal intensity diffusely heterogeneous without discrete worrisome osseous lesion. No other abnormal marrow edema or enhancement. Conus medullaris: Extends to the T12-L1 level and appears normal. No visible abnormal enhancement on this motion degraded exam. Paraspinal and other soft tissues: Paraspinous soft tissues demonstrate no acute finding. Prominent distension of the partially visualized urinary bladder noted. Disc levels: L1-2: Disc bulge with disc desiccation and intervertebral disc space narrowing. Mild to moderate bilateral facet hypertrophy. Resultant moderate spinal stenosis, with mild to moderate bilateral L1 foraminal narrowing. L2-3: Mild disc bulge. Moderate bilateral facet hypertrophy. No significant spinal stenosis. Foramina remain patent. L3-4: Anterolisthesis. Degenerative intervertebral disc space narrowing with diffuse disc bulge and disc desiccation. Disc bulging eccentric to the right. Moderate bilateral facet hypertrophy. Resultant moderate spinal stenosis. Mild to moderate right with mild left L3 foraminal narrowing. L4-5: Disc bulge with disc desiccation. Moderate bilateral facet hypertrophy. Mild  narrowing of the lateral recesses bilaterally. Central canal remains patent. Mild to moderate left L4 foraminal narrowing. Right neural foramen remains patent. L5-S1: Degenerative intervertebral disc space narrowing with disc desiccation and diffuse disc bulge. Reactive endplate spurring. Mild left greater than right facet hypertrophy. No significant spinal stenosis. Foramina remain patent. IMPRESSION: 1. Acute compression fracture involving the superior endplate of L1 with up to 35% height loss without significant bony retropulsion. 2. Additional probable acute fracture of the left sacrum, partially visualized. Follow-up with dedicated CT and/or MRI of the sacrum could be performed for further evaluation as warranted. 3. Mild edema within the spinous processes of T4 and T5, of uncertain etiology, but could reflect additional subtle fractures given the above findings. Correlation with physical exam for possible pain at this location recommended. 4. Normal MRI appearance of the thoracic spinal cord, conus medullaris, and cauda equina. No cord signal changes to suggest myelopathy. No abnormal enhancement. 5. Moderate lumbar spondylosis with resultant moderate spinal stenosis at L1-2 and L3-4. Additional more mild spondylosis elsewhere within the thoracolumbar spine as above. No other significant stenosis or neural impingement. 6. Prominent distension of the urinary bladder. Clinical correlation for possible urinary retention recommended. Electronically Signed   By: Jeannine Boga M.D.   On: 06/13/2021 03:17   CT CHEST ABDOMEN PELVIS W CONTRAST  Result Date: 06/14/2021 CLINICAL DATA:  L1 and sacral fractures, possible paraneoplastic syndrome EXAM: CT CHEST, ABDOMEN, AND PELVIS WITH CONTRAST TECHNIQUE: Multidetector CT imaging of the chest, abdomen and pelvis was performed following  the standard protocol during bolus administration of intravenous contrast. CONTRAST:  71m OMNIPAQUE IOHEXOL 350 MG/ML SOLN  COMPARISON:  Thoracolumbar MRI 06/12/2021, and previous FINDINGS: CT CHEST FINDINGS Cardiovascular: Mild cardiomegaly with biatrial enlargement. Scattered coronary calcifications. Aortic Atherosclerosis (ICD10-170.0). Mediastinum/Nodes: No mass or adenopathy. Lungs/Pleura: No pleural effusion. No pneumothorax. Partially calcified pleural-based 4 mm granuloma, anterior right upper lobe. 5 mm angular nodule adjacent to the right major fissure possibly intrapulmonary lymph node but nonspecific. Coarse scarring or atelectasis in the lung bases. Musculoskeletal: Shoulder DJD left worse than right. Probable chronic bilateral rib fracture deformities. No acute fracture or worrisome bone lesion. CT ABDOMEN PELVIS FINDINGS Hepatobiliary: No focal liver abnormality is seen. Status post cholecystectomy. No biliary dilatation. Pancreas: Unremarkable. No pancreatic ductal dilatation or surrounding inflammatory changes. Spleen: Normal in size without focal abnormality. Adrenals/Urinary Tract: 4.9 cm probable cyst, upper pole left kidney. There is mild bilateral hydronephrosis and ureterectasis without any definite urolithiasis. The urinary bladder is distended above the level of the umbilicus. Stomach/Bowel: Small hiatal hernia. The stomach is incompletely distended. Small bowel decompressed. Appendix not discretely identified. No pericecal inflammatory/edematous changes. Innumerable descending and sigmoid diverticula without significant adjacent inflammatory change. Vascular/Lymphatic: Scattered calcified aortoiliac plaque without aneurysm. Portal vein patent. No abdominal or pelvic adenopathy localized. Reproductive: Marked prostate enlargement. Other: No ascites.  No free air. Musculoskeletal: L1 superior endplate subacute compression fracture deformity with approximally the 30% loss of height anteriorly, no retropulsion. Spondylitic changes in the lower lumbar spine. Left hip arthroplasty hardware resulting in streak artifact  degrading portions of the scan. Right hip DJD. IMPRESSION: 1. No mass or adenopathy. 2. Marked prostate enlargement with distended urinary bladder, which may account for the bilateral hydronephrosis and ureterectasis in the absence of urolithiasis. 3. Descending and sigmoid diverticulosis 4. 5 mm right lower lobe pulmonary nodule. No follow-up needed if patient is low-risk. Non-contrast chest CT can be considered in 12 months if patient is high-risk. This recommendation follows the consensus statement: Guidelines for Management of Incidental Pulmonary Nodules Detected on CT Images: From the Fleischner Society 2017; Radiology 2017; 284:228-243. Electronically Signed   By: DLucrezia EuropeM.D.   On: 06/14/2021 13:52     LOS: 3 days   SOren Binet MD  [facility]    To contact the attending provider between 7A-7P or the covering provider during after hours 7P-7A, please log into the web site [email or web address] and access using universal [place] password for that web site. If you do not have the password, please call the hospital operator.  06/14/2021, 2:57 PM

## 2021-06-15 ENCOUNTER — Inpatient Hospital Stay (HOSPITAL_COMMUNITY): Payer: Medicare Other

## 2021-06-15 DIAGNOSIS — M7989 Other specified soft tissue disorders: Secondary | ICD-10-CM

## 2021-06-15 DIAGNOSIS — R29898 Other symptoms and signs involving the musculoskeletal system: Secondary | ICD-10-CM

## 2021-06-15 LAB — CULTURE, BLOOD (ROUTINE X 2)
Culture: NO GROWTH
Culture: NO GROWTH
Special Requests: ADEQUATE
Special Requests: ADEQUATE

## 2021-06-15 LAB — COMPREHENSIVE METABOLIC PANEL
ALT: 21 U/L (ref 0–44)
AST: 16 U/L (ref 15–41)
Albumin: 2.9 g/dL — ABNORMAL LOW (ref 3.5–5.0)
Alkaline Phosphatase: 110 U/L (ref 38–126)
Anion gap: 5 (ref 5–15)
BUN: 14 mg/dL (ref 8–23)
CO2: 24 mmol/L (ref 22–32)
Calcium: 9.3 mg/dL (ref 8.9–10.3)
Chloride: 102 mmol/L (ref 98–111)
Creatinine, Ser: 0.98 mg/dL (ref 0.61–1.24)
GFR, Estimated: >60 mL/min (ref >60)
Glucose, Bld: 122 mg/dL — ABNORMAL HIGH (ref 70–99)
Potassium: 4.8 mmol/L (ref 3.5–5.1)
Sodium: 131 mmol/L — ABNORMAL LOW (ref 135–145)
Total Bilirubin: 1.9 mg/dL — ABNORMAL HIGH (ref 0.3–1.2)
Total Protein: 5.4 g/dL — ABNORMAL LOW (ref 6.5–8.1)

## 2021-06-15 LAB — CRYOGLOBULIN

## 2021-06-15 LAB — CSF CULTURE W GRAM STAIN: Culture: NO GROWTH

## 2021-06-15 LAB — CBC
HCT: 32.3 % — ABNORMAL LOW (ref 39.0–52.0)
Hemoglobin: 11.1 g/dL — ABNORMAL LOW (ref 13.0–17.0)
MCH: 34.2 pg — ABNORMAL HIGH (ref 26.0–34.0)
MCHC: 34.4 g/dL (ref 30.0–36.0)
MCV: 99.4 fL (ref 80.0–100.0)
Platelets: 148 10*3/uL — ABNORMAL LOW (ref 150–400)
RBC: 3.25 MIL/uL — ABNORMAL LOW (ref 4.22–5.81)
RDW: 13.5 % (ref 11.5–15.5)
WBC: 7.1 10*3/uL (ref 4.0–10.5)
nRBC: 0 % (ref 0.0–0.2)

## 2021-06-15 MED ORDER — ZINC OXIDE 40 % EX OINT
TOPICAL_OINTMENT | Freq: Three times a day (TID) | CUTANEOUS | Status: DC | PRN
  Filled 2021-06-15: qty 57

## 2021-06-15 MED ORDER — LIP MEDEX EX OINT
1.0000 "application " | TOPICAL_OINTMENT | CUTANEOUS | Status: DC | PRN
  Filled 2021-06-15: qty 7

## 2021-06-15 NOTE — Progress Notes (Signed)
PROGRESS NOTE        PATIENT DETAILS Name: Parker Odonnell. Age: 87 y.o. Sex: male Date of Birth: [birth year] Admit Date: 06/10/2021 Admitting Physician Evalee Mutton Kristeen Mans, MD [id number], Dwyane Luo, MD  Brief Narrative: Patient is a 87 y.o. male persistent atrial fibrillation on Eliquis, HFrEF, HTN who presented to the hospital with right leg weakness and a purpuric right leg rash.  Given broad differential diagnosis (infectious/autoimmune/paraneoplastic/structural)-admitted to the [facility] service for further evaluation.  Work-up in progress-see below for results-ID/neurology following.  Significant events: 8/14>> presented to ED with right leg weakness/pain-fall-found to have rash-thought to have shingles-started on Valtrex and discharged  8/23>> admit to [facility] for right leg weakness/purpuric right leg rash eval/management 8/24>> punch biopsy by surgery 8/25>> LP-CSF w leukocytosis (lymphocytes) and elevated protein-Acyclovir started.  Significant radiological studies:  8/14>> CT head: No acute intracranial findings. 8/25>> MRI brain: No evidence of encephalitis/paraneoplastic disease/intracranial infection. 8/25>> MRA brain: No evidence of CNS vasculitis-no major stenosis. 8/25>> MRI thoracic/lumbar-spine: Compression fracture superior endplate of L1, acute fracture of left sacrum, mild edema within spinous process of T4/T5.  No MRI changes to suggest myelopathy. 8/27>> CT chest: 5 mm nodule in right major fissure-possibly intrapulmonary lymph node 8/27>> CT abdomen: No mass/adenopathy-Marked prostate enlargement-mild bilateral hydronephrosis.  Significant serological studies: 8/23>> TSH: Within normal limit 8/24>> RA factor: Negative 8/24>> ANA: Negative 8/24>> ds DNA antibody/anti-smooth antibody: Negative 8/24>> Sjogren's A/Sjogren's B antibody: Negative 8/24>> anti-smooth muscle antibody: Negative 8/24>> ANCA antibodies: Negative 8/24>> cardiolipin  antibodies: Negative 8/24>> Lyme antibody/PCR: Negative 8/24>> C3/C4/total complement: Normal limits 8/24>> C1 esterase inhibitor: Normal limit 8/24>> cryoglobulin: Pending 8/24>> SPEP/UPEP: No M spike noted.  No evidence for monoclonal antibodies 8/24>> Lupus anticoagulant: Not detected 8/25>>CSF Oligoclonal bands/IgG index:pending 8/27>>Vitamin B12: 998  Microbiology data: 8/23>> COVID PCR: Negative 8/23>> blood culture: No growth 8/24>> acute hepatitis panel: Negative 8/24>> HIV: Negative 8/25>> blood culture: No growth 8/25>> CSF cryptococcal antigen: Negative 8/25>> CSF culture: No growth for 3 days 8/25>> CSF VDRL: Pending 8/25>> CSF VZV PCR: Pending 8/25>> CSF HSV PCR: Negative 8/25>>CSF ARUP Encephalitis panel:Pending 8/26>>RPR:Non reactive  Pathology data: 8/24>> skin biopsy: Pending 8/25>> CSF cytology: negative for malignancy  Antimicrobial therapy: Acyclovir: 8/25>>  Procedures : 8/24>> punch biopsy performed by general surgery: Pending biopsy results 8/25>> lumbar puncture by neurology  Consults: Neuro, ID, general surgery  DVT Prophylaxis : Eliquis on hold-neurology contemplating repeat LP for flow cytometry.  Subjective: Patient continues to have weakness in his lower extremities, right more than left.  States that he is able to move it a little bit more than before.  Denies any other complaints at this time.   Assessment/Plan: Bilateral lower extremity weakness, right more than left/purpuric right lower extremity rash: Etiology unclear-differentials include infectious/vasculitis/paraneoplastic-extensive work-up in process.  Imaging studies so far unrevealing.  Autoimmune work-up negative so far.  Skin biopsy results still pending.   No masses or lymphadenopathy is seen on CT chest/abdomen.  Given lymphocytic pleocytosis and elevated protein on CSF studies-empirically on acyclovir.  HSV PCR negative.  VZV PCR is still pending.  Continue acyclovir for now.    Neurology is considering repeating lumbar puncture.    Hyponatremia: Volume status is stable-although he has a history of HFrEF-I wonder if he has developed SIADH physiology.  He is on low-dose IV fluid because he is on acyclovir.   CT  chest without any lung mass-although has a 5 mm lung nodule versus intrapulmonary lymph node.  Sodium level remains stable.  Persistent atrial fibrillation: Rate controlled-on Coreg-Eliquis held by neurology anticipation of repeat lumbar puncture.    HFrEF: Volume status is stable-continue to hold diuretics for now.  EF 35 to 40% based on echocardiogram done in 2020.  History of CAD-s/p remote PCI: No anginal symptoms.  Hyperlipidemia: Continue statin  Essential hypertension: BP stable-continue losartan/Coreg-follow and adjust.  Large prostate with mild hydronephrosis seen on CT abdomen: Renal function stable-started on low-dose Flomax  L1 superior endplate compression fracture/mild edema within spinous process of T4/T5: Seen incidentally on MRI imaging.  Supportive care.  5 mm right lower lobe pulmonary nodule versus intrapulmonary lymph node: Repeat noncontrast CT chest in 12 months to reassess.  Diet: Diet Order             Diet regular Room service appropriate? Yes; Fluid consistency: Thin  Diet effective now                    Code Status:  DNR  Family Communication: [id number]  Disposition Plan: CIR is recommended by PT and OT. Status is: Inpatient  The patient will require care spanning > 2 midnights and should be moved to inpatient because: Inpatient level of care appropriate due to severity of illness  Dispo: The patient is from: Home              Anticipated d/c is to: SNF              Patient currently is not medically stable to d/c.   Difficult to place patient No     Barriers to Discharge: Right lower extremity weakness-rash of unknown etiology-work-up in progress  Antimicrobial  agents: Anti-infectives (From admission, onward)    Start     Dose/Rate Route Frequency Ordered Stop   06/12/21 1815  acyclovir (ZOVIRAX) 660 mg in dextrose 5 % 100 mL IVPB        10 mg/kg  66.2 kg 113.2 mL/hr over 60 Minutes Intravenous Every 12 hours 06/12/21 1718           MEDICATIONS: Scheduled Meds:  atorvastatin  40 mg Oral QHS   carvedilol  3.125 mg Oral BID WC   docusate sodium  100 mg Oral BID   feeding supplement  237 mL Oral BID BM   losartan  50 mg Oral Daily   multivitamin with minerals  1 tablet Oral Daily   mupirocin cream   Topical BID   pregabalin  50 mg Oral BID   tamsulosin  0.4 mg Oral Daily   Continuous Infusions:  acyclovir 660 mg (06/15/21 1013)   PRN Meds:.acetaminophen **OR** acetaminophen, bisacodyl, hydrALAZINE, HYDROcodone-acetaminophen, morphine injection, ondansetron **OR** ondansetron (ZOFRAN) IV, polyethylene glycol, polyvinyl alcohol, traMADol   PHYSICAL EXAM: Vital signs: Vitals:   06/14/21 1953 06/14/21 2359 06/15/21 0300 06/15/21 0733  BP: 120/72 118/76 118/64 109/72  Pulse:  62 66 79  Resp: '18 17 16 20  '$ Temp: (!) 97.3 F (36.3 C) (!) 97.5 F (36.4 C) 98.6 F (37 C) 97.6 F (36.4 C)  TempSrc: Oral Axillary Axillary Oral  SpO2:  96% 92% 93%  Weight:       Filed Weights   06/14/21 0407  Weight: 67 kg   Body mass index is 22.46 kg/m.   General appearance: Awake alert.  In no distress Resp: Clear to auscultation bilaterally.  Normal effort Cardio: S1-S2 is normal regular.  No S3-S4.  No rubs murmurs or bruit GI: Abdomen is soft.  Nontender nondistended.  Bowel sounds are present normal.  No masses organomegaly Extremities: No edema. Neurologic: Alert and oriented x3.  Weakness noted bilateral lower extremities right more than left Purpuric rash right lower extremity    I have personally reviewed following labs and imaging studies  LABORATORY DATA: CBC: Recent Labs  Lab 06/10/21 1310 06/11/21 0143 06/12/21 0149  06/12/21 1455 06/13/21 0224 06/14/21 0111 06/15/21 0152  WBC 5.7   < > 5.3 5.3 6.0 7.0 7.1  NEUTROABS 4.0  --   --  3.6  --   --   --   HGB 13.7   < > 11.5* 12.4* 11.3* 11.1* 11.1*  HCT 39.6   < > 33.3* 36.4* 33.3* 31.5* 32.3*  MCV 98.0   < > 98.2 98.6 99.7 96.6 99.4  PLT 170   < > 134* 150 148* 147* 148*   < > = values in this interval not displayed.     Basic Metabolic Panel: Recent Labs  Lab 06/12/21 0149 06/12/21 1455 06/13/21 0224 06/14/21 0111 06/15/21 0152  NA 130* 130* 128* 130* 131*  K 3.9 4.0 3.9 4.3 4.8  CL 104 103 102 102 102  CO2 21* 23 22 21* 24  GLUCOSE 102* 99 119* 104* 122*  BUN '13 12 13 17 14  '$ CREATININE 0.82 0.92 0.82 0.83 0.98  CALCIUM 9.0 9.1 8.8* 9.1 9.3     GFR: Estimated Creatinine Clearance: 51.3 mL/min (by C-G formula based on SCr of 0.98 mg/dL).  Liver Function Tests: Recent Labs  Lab 06/10/21 1310 06/12/21 1455 06/13/21 0224 06/14/21 0111 06/15/21 0152  AST '26 26 23 17 16  '$ ALT '29 27 24 22 21  '$ ALKPHOS 111 111 99 113 110  BILITOT 2.3* 1.4* 1.9* 1.3* 1.9*  PROT 7.1 6.3* 5.5* 5.7* 5.4*  ALBUMIN 3.8 3.2* 3.0* 2.9* 2.9*     Cardiac Enzymes: Recent Labs  Lab 06/11/21 1406  CKTOTAL 62       Anemia Panel: Recent Labs    06/14/21 1824  VITAMINB12 998*    Urine analysis:    Component Value Date/Time   COLORURINE YELLOW 06/10/2021 2100   APPEARANCEUR HAZY (A) 06/10/2021 2100   LABSPEC 1.008 06/10/2021 2100   PHURINE 7.0 06/10/2021 2100   GLUCOSEU NEGATIVE 06/10/2021 2100   HGBUR NEGATIVE 06/10/2021 2100   [place] NEGATIVE 06/10/2021 2100   KETONESUR NEGATIVE 06/10/2021 2100   PROTEINUR NEGATIVE 06/10/2021 2100   UROBILINOGEN 0.2 06/13/2012 1027   NITRITE NEGATIVE 06/10/2021 2100   LEUKOCYTESUR MODERATE (A) 06/10/2021 2100      MICROBIOLOGY: Recent Results (from the past 240 hour(s))  Culture, blood (routine x 2)     Status: None (Preliminary result)   Collection Time: 06/10/21 12:55 PM   Specimen: BLOOD   Result Value Ref Range Status   Specimen Description BLOOD BLOOD RIGHT FOREARM  Final   Special Requests   Final    BOTTLES DRAWN AEROBIC AND ANAEROBIC Blood Culture adequate volume   Culture   Final    NO GROWTH 4 DAYS Performed at [facility], [place] [address], [place], [place] [address]    Report Status PENDING  Incomplete  Culture, blood (routine x 2)     Status: None (Preliminary result)   Collection Time: 06/10/21  1:10 PM   Specimen: BLOOD  Result Value Ref Range Status   Specimen Description BLOOD RIGHT ANTECUBITAL  Final   Special Requests  Final    BOTTLES DRAWN AEROBIC AND ANAEROBIC Blood Culture adequate volume   Culture   Final    NO GROWTH 4 DAYS Performed at [facility], [place] [address] [place], [place] [address]    Report Status PENDING  Incomplete  SARS CORONAVIRUS 2 (TAT 6-24 HRS) Nasopharyngeal Nasopharyngeal Swab     Status: None   Collection Time: 06/10/21  7:11 PM   Specimen: Nasopharyngeal Swab  Result Value Ref Range Status   SARS Coronavirus 2 NEGATIVE NEGATIVE Final    Comment: (NOTE) SARS-CoV-2 target nucleic acids are NOT DETECTED.  The SARS-CoV-2 RNA is generally detectable in upper and lower respiratory specimens during the acute phase of infection. Negative results do not preclude SARS-CoV-2 infection, do not rule out co-infections with other pathogens, and should not be used as the sole basis for treatment or other patient management decisions. Negative results must be combined with clinical observations, patient history, and epidemiological information. The expected result is Negative.  Fact Sheet for Patients: SugarRoll.be  Fact Sheet for Healthcare Providers: https://www.woods-mathews.com/  This test is not yet approved or cleared by the [place] FDA and  has been authorized for detection and/or diagnosis of SARS-CoV-2 by FDA under an Emergency Use Authorization (EUA). This EUA  will remain  in effect (meaning this test can be used) for the duration of the COVID-19 declaration under Se ction 564(b)(1) of the Act, 21 U.S.C. section 360bbb-3(b)(1), unless the authorization is terminated or revoked sooner.  Performed at [facility], [place] [address] [place], [place] [address]   Aerobic/Anaerobic Culture w Gram Stain (surgical/deep wound)     Status: None (Preliminary result)   Collection Time: 06/11/21 11:23 AM   Specimen: Tissue  Result Value Ref Range Status   Specimen Description TISSUE  Final   Special Requests SKIN EXCISIONAL BIOPSY  Final   Gram Stain   Final    RARE WBC PRESENT,BOTH PMN AND MONONUCLEAR NO ORGANISMS SEEN    Culture   Final    NO GROWTH 3 DAYS NO ANAEROBES ISOLATED; CULTURE IN PROGRESS FOR 5 DAYS Performed at [facility], [place] [address], [place], [place] [address]    Report Status PENDING  Incomplete  CSF culture w Stat Gram Stain     Status: None (Preliminary result)   Collection Time: 06/12/21  1:36 PM   Specimen: CSF; Cerebrospinal Fluid  Result Value Ref Range Status   Specimen Description CSF  Final   Special Requests NONE  Final   Gram Stain   Final    WBC PRESENT, PREDOMINANTLY MONONUCLEAR NO ORGANISMS SEEN CYTOSPIN SMEAR    Culture   Final    NO GROWTH 3 DAYS Performed at [facility], [place] [address], [place], [place] [address]    Report Status PENDING  Incomplete  Culture, blood (routine x 2)     Status: None (Preliminary result)   Collection Time: 06/12/21  2:47 PM   Specimen: BLOOD RIGHT ARM  Result Value Ref Range Status   Specimen Description BLOOD RIGHT ARM  Final   Special Requests   Final    BOTTLES DRAWN AEROBIC AND ANAEROBIC Blood Culture adequate volume   Culture   Final    NO GROWTH 2 DAYS Performed at [facility], [place] [address] [place], [place] [address]    Report Status PENDING  Incomplete  Culture, blood (routine x 2)     Status: None (Preliminary result)   Collection  Time: 06/12/21  2:55 PM   Specimen: BLOOD LEFT ARM  Result Value Ref Range Status   Specimen Description BLOOD LEFT ARM  Final   Special Requests   Final    BOTTLES DRAWN AEROBIC AND ANAEROBIC Blood Culture adequate volume   Culture   Final    NO GROWTH 2 DAYS Performed at [facility], [address] [place], [place] [address]    Report Status PENDING  Incomplete    RADIOLOGY STUDIES/RESULTS: CT CHEST ABDOMEN PELVIS W CONTRAST  Result Date: 06/14/2021 CLINICAL DATA:  L1 and sacral fractures, possible paraneoplastic syndrome EXAM: CT CHEST, ABDOMEN, AND PELVIS WITH CONTRAST TECHNIQUE: Multidetector CT imaging of the chest, abdomen and pelvis was performed following the standard protocol during bolus administration of intravenous contrast. CONTRAST:  6m OMNIPAQUE IOHEXOL 350 MG/ML SOLN COMPARISON:  Thoracolumbar MRI 06/12/2021, and previous FINDINGS: CT CHEST FINDINGS Cardiovascular: Mild cardiomegaly with biatrial enlargement. Scattered coronary calcifications. Aortic Atherosclerosis (ICD10-170.0). Mediastinum/Nodes: No mass or adenopathy. Lungs/Pleura: No pleural effusion. No pneumothorax. Partially calcified pleural-based 4 mm granuloma, anterior right upper lobe. 5 mm angular nodule adjacent to the right major fissure possibly intrapulmonary lymph node but nonspecific. Coarse scarring or atelectasis in the lung bases. Musculoskeletal: Shoulder DJD left worse than right. Probable chronic bilateral rib fracture deformities. No acute fracture or worrisome bone lesion. CT ABDOMEN PELVIS FINDINGS Hepatobiliary: No focal liver abnormality is seen. Status post cholecystectomy. No biliary dilatation. Pancreas: Unremarkable. No pancreatic ductal dilatation or surrounding inflammatory changes. Spleen: Normal in size without focal abnormality. Adrenals/Urinary Tract: 4.9 cm probable cyst, upper pole left kidney. There is mild bilateral hydronephrosis and ureterectasis without any definite  urolithiasis. The urinary bladder is distended above the level of the umbilicus. Stomach/Bowel: Small hiatal hernia. The stomach is incompletely distended. Small bowel decompressed. Appendix not discretely identified. No pericecal inflammatory/edematous changes. Innumerable descending and sigmoid diverticula without significant adjacent inflammatory change. Vascular/Lymphatic: Scattered calcified aortoiliac plaque without aneurysm. Portal vein patent. No abdominal or pelvic adenopathy localized. Reproductive: Marked prostate enlargement. Other: No ascites.  No free air. Musculoskeletal: L1 superior endplate subacute compression fracture deformity with approximally the 30% loss of height anteriorly, no retropulsion. Spondylitic changes in the lower lumbar spine. Left hip arthroplasty hardware resulting in streak artifact degrading portions of the scan. Right hip DJD. IMPRESSION: 1. No mass or adenopathy. 2. Marked prostate enlargement with distended urinary bladder, which may account for the bilateral hydronephrosis and ureterectasis in the absence of urolithiasis. 3. Descending and sigmoid diverticulosis 4. 5 mm right lower lobe pulmonary nodule. No follow-up needed if patient is low-risk. Non-contrast chest CT can be considered in 12 months if patient is high-risk. This recommendation follows the consensus statement: Guidelines for Management of Incidental Pulmonary Nodules Detected on CT Images: From the Fleischner Society 2017; Radiology 2017; 284:228-243. Electronically Signed   By: DLucrezia EuropeM.D.   On: 06/14/2021 13:52   VAS [place]OWER EXTREMITY VENOUS (DVT)  Result Date: 06/15/2021  Lower Venous DVT Study Patient Name:  JDeren Odonnell  Date of Exam:   06/15/2021 Medical Rec #: [id number]         Accession #:    [id number] of Birth: [birth year]        Patient Gender: M Patient Age:   89years [facility]      VAS [place]OWER EXTREMITY VENOUS (DVT) Referring Phys:  SLesleigh Noe--------------------------------------------------------------------------------  Indications: Swelling.  Comparison Study: No prior studies. Performing  Technologist: Darlin Coco RDMS, RVT  Examination Guidelines: A complete evaluation includes B-mode imaging, spectral Doppler, color Doppler, and power Doppler as needed of all accessible portions of each vessel. Bilateral testing is considered an integral part of a complete examination. Limited examinations for reoccurring indications may be performed as noted. The reflux portion of the exam is performed with the patient in reverse Trendelenburg.  +---------+---------------+---------+-----------+----------+--------------+ RIGHT    CompressibilityPhasicitySpontaneityPropertiesThrombus Aging +---------+---------------+---------+-----------+----------+--------------+ CFV      Full           Yes      Yes                                 +---------+---------------+---------+-----------+----------+--------------+ SFJ      Full                                                        +---------+---------------+---------+-----------+----------+--------------+ FV Prox  Full                                                        +---------+---------------+---------+-----------+----------+--------------+ FV Mid   Full                                                        +---------+---------------+---------+-----------+----------+--------------+ FV DistalFull                                                        +---------+---------------+---------+-----------+----------+--------------+ PFV      Full                                                        +---------+---------------+---------+-----------+----------+--------------+ POP      Full           Yes      Yes                                 +---------+---------------+---------+-----------+----------+--------------+ PTV      Full                                                         +---------+---------------+---------+-----------+----------+--------------+ PERO     Full                                                        +---------+---------------+---------+-----------+----------+--------------+   +----+---------------+---------+-----------+----------+--------------+  LEFTCompressibilityPhasicitySpontaneityPropertiesThrombus Aging +----+---------------+---------+-----------+----------+--------------+ CFV Full           Yes      Yes                                 +----+---------------+---------+-----------+----------+--------------+     Summary: RIGHT: - There is no evidence of deep vein thrombosis in the lower extremity.  - No cystic structure found in the popliteal fossa.  LEFT: - No evidence of common femoral vein obstruction.  *See table(s) above for measurements and observations.    Preliminary      LOS: 4 days   Bonnielee Haff, MD  [facility]    To contact the attending provider between 7A-7P or the covering provider during after hours 7P-7A, please log into the web site [email or web address] and access using universal [place] password for that web site. If you do not have the password, please call the hospital operator.  06/15/2021, 11:34 AM

## 2021-06-15 NOTE — Progress Notes (Signed)
Neurology Progress Note  Brief HPI: 87 y.o. 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 Previous ED evaluation on 8/14 patient was diagnosed with possible shingles for a rash on the RLE and started on valacyclovir. His weakness continued to worsen with bilateral R > L lower extremity weakness and neurology was consulted for further evaluation of weakness.   Prior to examining the patient today I had an approximately 20-minute conversation with family confirming history and time course of events.  Daughter explained about the patient's macular edema makes it very difficult for him to see things and she believes that that is why he never noticed his leg rash.  He also tends to minimize his symptoms and so she is uncertain exactly when his leg pain started although he first complained about it to her on 8/12.  She first saw the rash on 8/14 while accompanying him in the ED when he was changed into a gown for examination, and notes that he always wears pants even in the summer and therefore she really does not know when it started.  She does feel that the rash looked more like blisters on 8/14 and stated that she has a picture of the rash on her phone which she will plan to try to upload to his chart via [place].  She confirms on repeated questioning that both the extent of the rash as well as the extent of the patient's weakness has been stable over the past 2 weeks, but he has not improved.  Subjective: Sleeping comfortably  Exam: Vitals:   06/15/21 1650 06/15/21 2004  BP: 118/62 131/61  Pulse: 63 (!) 58  Resp: 16 15  Temp: 98 F (36.7 C) 98.6 F (37 C)  SpO2: 97% 97%   Please see detailed examination from 8/27  Unfortunately on my attempt evaluate today (8/28), the patient  was initially asleep and immediately on awakening him I was paged for an emergent code stroke.  Gen: Sitting up in bedside recliner, in no acute distress Resp: non-labored breathing, no respiratory distress on room air Abd: soft, non-tender, non-distended Extremities: The right lower extremity is notably more edematous than the left Spine: There is no tenderness to palpation along the entirety of the spine Skin: In addition to the rash as described on the right lower extremity, there are several spots in the left upper thigh as well as just above the sacrum that appear similar.  Neuro: Mental Status: Awake, alert, and oriented to person, place, time, and situation He is able to provide some details about the history of his present illness but replies "I don't know" to many questions Speech is intact without dysarthria.  No aphasia or neglect noted in casual conversation Cranial Nerves: PERRL, EOMI without ptosis, facial sensation intact and symmetric to light touch, face is has a slight right droop at rest and with activation, hearing is intact to voice, shoulders shrug symmetrically, phonation is normal, palate rises symmetrically, tongue protrudes midline.  Motor: At rest he makes many adventitious movements of all 4 extremities that he reports is "just loosening up" he has some slight paratonia on tone testing but no clear spasticity.  5/5 in the left upper extremity, very subtle weakness 4++/5 in the right upper extremity.  When laying in bed he cannot lift the  right hip antigravity, but seated on the side of the bed he is 4 -/5 in the right hip flexion, 4+/5 in the left hip flexion.  He is otherwise 4+/5 in knee flexion and extension on the right and 5/5 in knee flexion and extension on the left. Sensory: He has a length dependent loss of pinprick and cold temperature in the bilateral arms and legs, as well as a severe allodynia in the bilateral lower extremities below the knees.  He has  diminished vibration sensation (absent at the toes, 6 seconds at the knees, 9 seconds at the hands).  Additionally to sharp sensation tested along the trunk, he appears to have approximately a T8 sensory level on the back and T5 in the front  DTR: 2+ and symmetric biceps, 1+ and symmetric patellae, stable Gait: Patient seated at the side of the bed but reported he did not feel safe ambulating with only 1 person assist and therefore this was deferred  Pertinent Labs: CBC    Component Value Date/Time   WBC 7.1 06/15/2021 0152   RBC 3.25 (L) 06/15/2021 0152   HGB 11.1 (L) 06/15/2021 0152   HGB 13.0 11/10/2019 1246   HCT 32.3 (L) 06/15/2021 0152   HCT 38.9 11/10/2019 1246   PLT 148 (L) 06/15/2021 0152   PLT 142 (L) 11/10/2019 1246   MCV 99.4 06/15/2021 0152   MCV 102 (H) 11/10/2019 1246   MCH 34.2 (H) 06/15/2021 0152   MCHC 34.4 06/15/2021 0152   RDW 13.5 06/15/2021 0152   RDW 12.1 11/10/2019 1246   LYMPHSABS 0.9 06/12/2021 1455   LYMPHSABS 1.2 12/23/2016 0804   MONOABS 0.6 06/12/2021 1455   EOSABS 0.2 06/12/2021 1455   EOSABS 0.4 12/23/2016 0804   BASOSABS 0.0 06/12/2021 1455   BASOSABS 0.0 12/23/2016 0804   CMP     Component Value Date/Time   NA 131 (L) 06/15/2021 0152   NA 139 11/10/2019 1246   K 4.8 06/15/2021 0152   CL 102 06/15/2021 0152   CO2 24 06/15/2021 0152   GLUCOSE 122 (H) 06/15/2021 0152   BUN 14 06/15/2021 0152   BUN 12 11/10/2019 1246   CREATININE 0.98 06/15/2021 0152   CREATININE 0.96 12/19/2015 0825   CALCIUM 9.3 06/15/2021 0152   PROT 5.4 (L) 06/15/2021 0152   PROT 6.5 12/23/2016 0804   ALBUMIN 2.9 (L) 06/15/2021 0152   ALBUMIN 4.1 12/23/2016 0804   AST 16 06/15/2021 0152   ALT 21 06/15/2021 0152   ALKPHOS 110 06/15/2021 0152   BILITOT 1.9 (H) 06/15/2021 0152   BILITOT 1.5 (H) 12/23/2016 0804   GFRNONAA >60 06/15/2021 0152   GFRAA >60 01/12/2020 0117   HIV 06/12/2021: Non Reactive Lyme disease serology: Negative  Results for ABDULSAMAD, TEXEIRA  (MRN [id number]) as of 06/13/2021 09:15  Ref. Range 06/12/2021 13:34 06/12/2021 13:35  Appearance, CSF Latest Ref Range: CLEAR  CLEAR (A)   Glucose, CSF Latest Ref Range: 40 - 70 mg/dL  55  RBC Count, CSF Latest Ref Range: 0 /cu mm 25 (H)   WBC, CSF Latest Ref Range: 0 - 5 /cu mm 69 (HH)   Segmented Neutrophils-CSF Latest Ref Range: 0 - 6 % 0   Lymphs, CSF Latest Ref Range: 40 - 80 % 89 (H)   Monocyte-Macrophage-Spinal Fluid Latest Ref Range: 15 - 45 % 11 (L)   Color, CSF Latest Ref Range: COLORLESS  COLORLESS   Supernatant Unknown NOT INDICATED   Total  Protein, CSF Latest Ref Range:  15 - 45 mg/dL  75 (H)  Tube # Unknown 1    Results for CLEMENS, POORE (MRN [id number]) as of 06/13/2021 09:15  Ref. Range 06/11/2021 14:06  Anti Nuclear Antibody (ANA) Latest Ref Range: Negative  Negative  ds DNA Ab Latest Ref Range: 0 - 9 IU/mL <1  RA Latex Turbid. Latest Ref Range: <14.0 IU/mL <10.0  Cytoplasmic (C-ANCA) Latest Ref Range: Neg:<1:20 titer <1:20  P-ANCA Latest Ref Range: Neg:<1:20 titer <1:20  Atypical P-ANCA titer Latest Ref Range: Neg:<1:20 titer <1:20  ENA SM Ab Ser-aCnc Latest Ref Range: 0.0 - 0.9 AI <0.2  Anti-MPO Antibodies Latest Ref Range: 0.0 - 0.9 units <0.2  Anti-PR3 Antibodies Latest Ref Range: 0.0 - 0.9 units <0.2  C1INH SerPl-mCnc Latest Ref Range: 21 - 39 mg/dL 25  C3 Complement Latest Ref Range: 82 - 167 mg/dL 107  Compl, Total (CH50) Latest Ref Range: >41 U/mL 50  Complement C4, Body Fluid Latest Ref Range: 12 - 38 mg/dL 21  Lyme Total Antibody EIA Latest Ref Range: Negative  Negative  SSA (Ro) (ENA) Antibody, IgG Latest Ref Range: 0.0 - 0.9 AI <0.2  SSB (La) (ENA) Antibody, IgG Latest Ref Range: 0.0 - 0.9 AI <0.2  Hep A Ab, IgM Latest Ref Range: NON REACTIVE  NON REACTIVE  Hepatitis B Surface Ag Latest Ref Range: NON REACTIVE  NON REACTIVE  Hep B Core Ab, IgM Latest Ref Range: NON REACTIVE  NON REACTIVE  HCV Ab Latest Ref Range: NON REACTIVE  NON REACTIVE  HIV Screen  4th Generation wRfx Latest Ref Range: Non Reactive  Non Reactive   CSF culture pending CSF gram stain: no organisms seen, WBC present, predominantly mononuclear Cryptococcal antigen CSF negative CSF cytology -- negative for malignant cells  Lab Results  Component Value Date   ESRSEDRATE 15 06/10/2021   Lab Results  Component Value Date   CRP 0.6 06/10/2021   Lab Results  Component Value Date   VITAMINB12 998 (H) 06/14/2021     Pertinent imaging:  MRI brain [facility] 06/13/2021: 1. No acute intracranial abnormality. Specifically, no MRI evidence for encephalitis, paraneoplastic disease, or intracranial infection. 2. 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 No associated mass effect. This is felt to be incidental in nature and of doubtful significance. 3. Otherwise normal brain MRI for age. 4. Small right greater than left mastoid effusions.  MRA head WO contrast 06/13/2021: Normal intracranial MRA. No MRI evidence for CNS vasculitis.  MRI thoracic and lumbar spine WWO contrast 06/13/2021: 1. Acute compression fracture involving the superior endplate of L1 with up to 35% height loss without significant bony retropulsion. 2. Additional probable acute fracture of the left sacrum, partially visualized. Follow-up with dedicated CT and/or MRI of the sacrum could be performed for further evaluation as warranted. 3. Mild edema within the spinous processes of T4 and T5, of uncertain etiology, but could reflect additional subtle fractures given the above findings. Correlation with physical exam for possible pain at this location recommended. 4. Normal MRI appearance of the thoracic spinal cord, conus medullaris, and cauda equina. No cord signal changes to suggest myelopathy. No abnormal enhancement. 5. Moderate lumbar spondylosis with resultant moderate spinal stenosis at L1-2 and L3-4. Additional more mild spondylosis  elsewhere within the thoracolumbar spine as above. No other significant stenosis or neural impingement. 6. Prominent distension of the urinary bladder. Clinical correlation for possible urinary retention recommended.  CT C/A/P  w/ contrast Musculoskeletal: L1 superior endplate subacute compression fracture deformity with approximally the 30% loss of height anteriorly, no retropulsion. Spondylitic changes in the lower lumbar spine. Left hip arthroplasty hardware resulting in streak artifact degrading portions of the scan. Right hip DJD. IMPRESSION: 1. No mass or adenopathy. 2. Marked prostate enlargement with distended urinary bladder, which may account for the bilateral hydronephrosis and ureterectasis in the absence of urolithiasis.  3. Descending and sigmoid diverticulosis 4. 5 mm right lower lobe pulmonary nodule. No follow-up needed if patient is low-risk. Non-contrast chest CT can be considered in 12 months if patient is high-risk. This recommendation follows the consensus statement: Guidelines for Management of Incidental Pulmonary Nodules Detected on CT Images: From the Fleischner Society 2017; Radiology 2017; 284:228-243.  Assessment: 87 year old male with history of squamous cell carcinoma of back status-post resection, atrial fibrillation on Eliquis who has had worsening right more than left lower extremity weakness over 3 weeks as well as right lower extremity rash.  On 8/27 my examination is notable for stable strength testing, sensory testing that is challenging to localize (possible spinal cord level, length dependent polyneuropathy, possibly multiple overlaid processes)  At this time he is being empirically treated for VZV vasculitis, although his rash is felt to be fairly atypical for VZV.  He has had extensive serological work-up for other etiologies of vasculitis which has been negative to date.  Lymphocytic pleocytosis could be secondary to a viral infection or far more rare but  more concerning a process such as lymphoma.  Paraneoplastic process is also on the differential, although work-up for malignancy has been revealing only for a lung nodule and markedly enlarged prostate.  He has also been found to be fairly osteopenic with an subacute to acute L-spine compression fracture though fortunately he has minimal tenderness to palpation  As detailed above, discussed extensively with family as well with Dr. Nicki Guadalajara today and Dr. Quinn Axe.  We will plan to have Dr. Quinn Axe reexamine the patient tomorrow, confirm that he is clinically stable and decide whether repeat lumbar puncture for flow cytometry is indicated on an inpatient basis versus reserved for the patient worsening.  At minimum we will continue acyclovir for a 7-day course, consider 10 to 14-day course.  Additionally will obtain MRI pelvis to clarify potential sacral fracture  Impression:  Bilateral lower extremity paresis, stable Right lower extremity rash, improving Acute L1 compression fracture, possible subtle fractures at T4/T5 Acute possible fraction of the left sacrum Hyponatremia, stable Markedly enlarged prostate with distended bladder Lung nodule  Recommendations:  # Concern for vasculitis  - Follow up on pending labs: RPR, protein electrophoresis, lupus anticoagulant panel, Lyme disease DNA by PCR; CSF: IgG, VZV PCR, HSV 1/2 PCR, Oligoclonal bands, CSF culture, VZV IgG send out  - Continue pregabalin 50 mg BID - We will again hold Eliquis at this time, given further work-up including repeat lumbar puncture for flow cytometry may be needed, though again given his clinical stability it may be prudent to hold off on this at this time and only repeat the lumbar puncture if he has further worsening  # Concern for occult malignancy - CT chest abdomen pelvis for malignancy screening notable for 5 mm right lower lobe pulmonary nodule, and marked prostate enlargement - Urology follow-up - Pulmonary follow-up  #  Possible thoracic and sacral fractures - MRI pelvis  # Unilateral RLE edema, concern for DVT  - Lower extremity duplex to evaluate for potential DVT  Lesleigh Noe MD-PhD [facility]  [phone] Available 7 PM to 7 AM, outside of these hours please call Neurologist on call as listed on Amion.  Greater than 35 minutes were spent in direct care of this patient today, the majority of which was spent in detailed discussion with family of the patient to confirm clinical stability as documented above

## 2021-06-15 NOTE — Progress Notes (Signed)
Lower extremity venous RT study completed.   Please see CV Proc for preliminary results.   Ferris Fielden, RDMS, RVT  

## 2021-06-16 ENCOUNTER — Inpatient Hospital Stay (HOSPITAL_COMMUNITY): Payer: Medicare Other

## 2021-06-16 DIAGNOSIS — R21 Rash and other nonspecific skin eruption: Secondary | ICD-10-CM | POA: Diagnosis not present

## 2021-06-16 LAB — UPEP/UIFE/LIGHT CHAINS/TP, 24-HR UR
% BETA, Urine: 0 %
ALPHA 1 URINE: 0 %
Albumin, U: 100 %
Alpha 2, Urine: 0 %
Free Kappa Lt Chains,Ur: 34.99 mg/L (ref 1.17–86.46)
Free Kappa/Lambda Ratio: 5.52 (ref 1.83–14.26)
Free Lambda Lt Chains,Ur: 6.34 mg/L (ref 0.27–15.21)
GAMMA GLOBULIN URINE: 0 %
Total Protein, Urine-Ur/day: 80 mg/24 hr (ref 30–150)
Total Protein, Urine: 5.3 mg/dL
Total Volume: 1500

## 2021-06-16 LAB — COMPREHENSIVE METABOLIC PANEL
ALT: 21 U/L (ref 0–44)
AST: 16 U/L (ref 15–41)
Albumin: 2.9 g/dL — ABNORMAL LOW (ref 3.5–5.0)
Alkaline Phosphatase: 105 U/L (ref 38–126)
Anion gap: 5 (ref 5–15)
BUN: 14 mg/dL (ref 8–23)
CO2: 24 mmol/L (ref 22–32)
Calcium: 9.1 mg/dL (ref 8.9–10.3)
Chloride: 102 mmol/L (ref 98–111)
Creatinine, Ser: 1.02 mg/dL (ref 0.61–1.24)
GFR, Estimated: >60 mL/min (ref >60)
Glucose, Bld: 115 mg/dL — ABNORMAL HIGH (ref 70–99)
Potassium: 4.1 mmol/L (ref 3.5–5.1)
Sodium: 131 mmol/L — ABNORMAL LOW (ref 135–145)
Total Bilirubin: 1.4 mg/dL — ABNORMAL HIGH (ref 0.3–1.2)
Total Protein: 5.6 g/dL — ABNORMAL LOW (ref 6.5–8.1)

## 2021-06-16 LAB — CBC
HCT: 30.6 % — ABNORMAL LOW (ref 39.0–52.0)
Hemoglobin: 10.5 g/dL — ABNORMAL LOW (ref 13.0–17.0)
MCH: 34 pg (ref 26.0–34.0)
MCHC: 34.3 g/dL (ref 30.0–36.0)
MCV: 99 fL (ref 80.0–100.0)
Platelets: 137 10*3/uL — ABNORMAL LOW (ref 150–400)
RBC: 3.09 MIL/uL — ABNORMAL LOW (ref 4.22–5.81)
RDW: 13.5 % (ref 11.5–15.5)
WBC: 6 10*3/uL (ref 4.0–10.5)
nRBC: 0 % (ref 0.0–0.2)

## 2021-06-16 LAB — PATHOLOGIST SMEAR REVIEW: Path Review: INCREASED

## 2021-06-16 MED ORDER — SODIUM CHLORIDE 0.9 % IV SOLN: INTRAVENOUS | Status: DC

## 2021-06-16 MED ORDER — DEXTROSE 5 % IV SOLN
660.0000 mg | Freq: Two times a day (BID) | INTRAVENOUS | Status: DC
  Filled 2021-06-16: qty 13.2

## 2021-06-16 MED ORDER — DEXTROSE 5 % IV SOLN
660.0000 mg | Freq: Two times a day (BID) | INTRAVENOUS | Status: DC
  Administered 2021-06-16 – 2021-06-25 (×18): 660 mg via INTRAVENOUS
  Filled 2021-06-16 (×20): qty 13.2

## 2021-06-16 NOTE — Progress Notes (Signed)
Physical Therapy Treatment Patient Details Name: Parker Odonnell. MRN: [id number] DOB: 07-26-1932 Today's Date: 06/16/2021    History of Present Illness 87 y.o. male  presented 06/10/21 with rash, weakness. 06/13/21 MRI brain no acute anbormality; MRI spine L1 compression fx, no cord signal changes  PMH significant of CAD; CHF; HLD; afib on Eliquis; and HTN    PT Comments    Patient very motivated and reports he feels his legs are a bit stronger. Able to lift RLE off bed in supine. Multiple sit to stands from EOB with +1 assist and 2nd person for pericare (again with episode of incontinence of bowels prior to PT arrival--pt reports he knows when he needs to go, but nursing cannot get there fast enough to use bedpan). Able to keep knees extended as stepping from bed to chair while using RW.      Follow Up Recommendations  CIR     Equipment Recommendations  Rolling walker with 5" wheels    Recommendations for Other Services Rehab consult;OT consult     Precautions / Restrictions Precautions Precautions: Fall Precaution Comments: per son, 8/14 after legs began feeling weak    Mobility  Bed Mobility Overal bed mobility: Needs Assistance Bed Mobility: Supine to Sit     Supine to sit: Min guard;HOB elevated     General bed mobility comments: incr time, effort and cues    Transfers Overall transfer level: Needs assistance Equipment used: Rolling walker (2 wheeled) Transfers: Sit to/from Stand Sit to Stand: Mod assist Stand pivot transfers: Min assist;+2 safety/equipment       General transfer comment: +1 assist sit to stand from EOB x 4. Stood for up to 2 minutes for cleaning from incontinence of bowels. With assist, able to get knees extended and then maintained knee extension.  Ambulation/Gait Ambulation/Gait assistance: Min assist;+2 safety/equipment Gait Distance (Feet): 2 Feet Assistive device: Rolling walker (2 wheeled) Gait Pattern/deviations: Step-to pattern      General Gait Details: after multiple sit tostand and standing for pericare multiple times   Stairs             Wheelchair Mobility    Modified Rankin (Stroke Patients Only)       Balance Overall balance assessment: Needs assistance Sitting-balance support: Feet supported Sitting balance-Leahy Scale: Fair     Standing balance support: Bilateral upper extremity supported;During functional activity Standing balance-Leahy Scale: Poor Standing balance comment: reliant on external support                            Cognition Arousal/Alertness: Awake/alert Behavior During Therapy: WFL for tasks assessed/performed Overall Cognitive Status: Within Functional Limits for tasks assessed                                 General Comments: pt a&ox4      Exercises      General Comments        Pertinent Vitals/Pain Pain Assessment: No/denies pain    Home Living                      Prior Function            PT Goals (current goals can now be found in the care plan section) Acute Rehab PT Goals Patient Stated Goal: Need to move better Time For Goal Achievement: 06/25/21 Potential to Achieve Goals: Good  Progress towards PT goals: Progressing toward goals    Frequency    Min 3X/week      PT Plan Current plan remains appropriate    Co-evaluation              AM-PAC PT "6 Clicks" Mobility   Outcome Measure  Help needed turning from your back to your side while in a flat bed without using bedrails?: None Help needed moving from lying on your back to sitting on the side of a flat bed without using bedrails?: A Little Help needed moving to and from a bed to a chair (including a wheelchair)?: Total Help needed standing up from a chair using your arms (e.g., wheelchair or bedside chair)?: A Lot Help needed to walk in hospital room?: Total Help needed climbing 3-5 steps with a railing? : Total 6 Click Score: 12     End of Session Equipment Utilized During Treatment: Gait belt Activity Tolerance: Patient tolerated treatment well Patient left: in chair;with call bell/phone within reach;with chair alarm set Nurse Communication: Mobility status PT Visit Diagnosis: Other abnormalities of gait and mobility (R26.89);Muscle weakness (generalized) (M62.81);History of falling (Z91.81)     Time: [id number] PT Time Calculation (min) (ACUTE ONLY): 31 min  Charges:  $Therapeutic Activity: 23-37 mins                      Arby Barrette, PT Pager [phone]    Rexanne Mano 06/16/2021, 10:30 AM

## 2021-06-16 NOTE — Progress Notes (Signed)
Inpatient Rehab Admissions Coordinator:   Workup still pending.  Will continue to follow for diagnosis.    Shann Medal, PT, DPT Admissions Coordinator [phone] 06/16/21  1:09 PM

## 2021-06-16 NOTE — Progress Notes (Signed)
[facility] Infectious Diseases Follow Up Note  Patient Identification: Patient Name: Parker Odonnell. MRN: [id number] Admit Date: 06/10/2021 12:19 PM Age: 87 y.o.Today's Date: 06/16/2021   Reason for Visit: Bilateral lower extremity weakness  Principal Problem:   Rash Active Problems:   Dyslipidemia   Essential hypertension   Atrial flutter (HCC)   Weakness   Chronic combined systolic (congestive) and diastolic (congestive) heart failure (HCC)   DNR (do not resuscitate)   Antibiotics:  Acyclovir 8/25-current  Lines/Tubes: PIV's, external urethral catheter  Interval Events: continues to be afebrile, no leukocytosis, awake alert and oriented, ? Purpuric appearing rashes noted in the right lower leg more than left upper thigh and lower sacral area  Assessment Bilateral lower extremity weakness ( Rt >>Left) with purpuric rashes in the Rt lower leg/left thigh and lower back -Work-up for infectious causes unrevealing so far.  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 Less likely infectious etiology  -ID work up: RPR nonreactive, HIV and acute hep panel negative, Lyme serology/PCR negative                     CSF 8/25 VDRL nonreactive, HSV 1/HSV-2 DNA negative, cryptococcal antigen negative -Multiple Autoimmune/vasculitis work-up is also unrevealing so far  2.  Right lower lobe pulmonary nodule( 21m) and marked prostatomegaly   Recommendations Reasonable to continue Acyclovir pending VZV PCR. Neurology planning for a 7 days course at minimum, considering 10-14 days course noted.  Follow-up MRI pelvis due to concern of thoracic and sacral fractures Follow-up neurology recommendation - plan for repeat LP noted  Low suspicion for an infective etiology overall. Will follow-up pending send  out labs including [facility] meningitis/encephalitis panel peripherally  Plan discussed with patient/ID pharmacy and Primary  Rest of the management as per the primary team. Thank you for the consult. Please page with pertinent questions or concerns.  ______________________________________________________________________ Subjective patient seen and examined at the bedside.  Sitting up in bed.  He is awake alert and oriented.  He tells me his legs has been weak for 3 weeks and he was ambulatory prior to that.  Denies any new fever, chills and sweats.  Denies cough, shortness of breath or chest pain.  Denies any nausea, vomiting and diarrhea.   Vitals BP 125/71   Pulse 72   Temp 97.7 F (36.5 C) (Oral)   Resp 14   Wt 67 kg   SpO2 97%   BMI 22.46 kg/m     Physical Exam Constitutional:  Not in acute distress    Comments:   Cardiovascular:     Rate and Rhythm: Normal rate and regular rhythm.     Heart sounds:   Pulmonary:     Effort: Pulmonary effort is normal.     Comments:   Abdominal:     Palpations: Abdomen is soft.     Tenderness: Non tender, BS+  Musculoskeletal:        General: No swelling or tenderness.   Skin:    Comments:          Neurological:     General: Bilateral lower extremity weakness ( 4+/5 in Rt LE and 5/5 in LLE); RUE 4/5 and LUE 5/5   Psychiatric:        Mood and Affect: Mood normal. Calm and cooperative   Pertinent Microbiology Results for orders placed or performed during the hospital  encounter of 06/10/21  Culture, blood (routine x 2)     Status: None   Collection Time: 06/10/21 12:55 PM   Specimen: BLOOD  Result Value Ref Range Status   Specimen Description BLOOD BLOOD RIGHT FOREARM  Final   Special Requests   Final    BOTTLES DRAWN AEROBIC AND ANAEROBIC Blood Culture adequate volume   Culture   Final    NO GROWTH 5 DAYS Performed at [facility], [address] [place], [place] [address]    Report Status 06/15/2021 FINAL   Final  Culture, blood (routine x 2)     Status: None   Collection Time: 06/10/21  1:10 PM   Specimen: BLOOD  Result Value Ref Range Status   Specimen Description BLOOD RIGHT ANTECUBITAL  Final   Special Requests   Final    BOTTLES DRAWN AEROBIC AND ANAEROBIC Blood Culture adequate volume   Culture   Final    NO GROWTH 5 DAYS Performed at [facility], [place] [address], [place], [place] [address]    Report Status 06/15/2021 FINAL  Final  SARS CORONAVIRUS 2 (TAT 6-24 HRS) Nasopharyngeal Nasopharyngeal Swab     Status: None   Collection Time: 06/10/21  7:11 PM   Specimen: Nasopharyngeal Swab  Result Value Ref Range Status   SARS Coronavirus 2 NEGATIVE NEGATIVE Final    Comment: (NOTE) SARS-CoV-2 target nucleic acids are NOT DETECTED.  The SARS-CoV-2 RNA is generally detectable in upper and lower respiratory specimens during the acute phase of infection. Negative results do not preclude SARS-CoV-2 infection, do not rule out co-infections with other pathogens, and should not be used as the sole basis for treatment or other patient management decisions. Negative results must be combined with clinical observations, patient history, and epidemiological information. The expected result is Negative.  Fact Sheet for Patients: SugarRoll.be  Fact Sheet for Healthcare Providers: https://www.woods-mathews.com/  This test is not yet approved or cleared by the [place] FDA and  has been authorized for detection and/or diagnosis of SARS-CoV-2 by FDA under an Emergency Use Authorization (EUA). This EUA will remain  in effect (meaning this test can be used) for the duration of the COVID-19 declaration under Se ction 564(b)(1) of the Act, 21 U.S.C. section 360bbb-3(b)(1), unless the authorization is terminated or revoked sooner.  Performed at [facility], [place] [address], [place], [place] [address]   Aerobic/Anaerobic Culture w Gram  Stain (surgical/deep wound)     Status: None (Preliminary result)   Collection Time: 06/11/21 11:23 AM   Specimen: Tissue  Result Value Ref Range Status   Specimen Description TISSUE  Final   Special Requests SKIN EXCISIONAL BIOPSY  Final   Gram Stain   Final    RARE WBC PRESENT,BOTH PMN AND MONONUCLEAR NO ORGANISMS SEEN    Culture   Final    CULTURE REINCUBATED FOR BETTER GROWTH Performed at [facility], [place] [address], [place], [place] [address]    Report Status PENDING  Incomplete  CSF culture w Stat Gram Stain     Status: None   Collection Time: 06/12/21  1:36 PM   Specimen: CSF; Cerebrospinal Fluid  Result Value Ref Range Status   Specimen Description CSF  Final   Special Requests NONE  Final   Gram Stain   Final    WBC PRESENT, PREDOMINANTLY MONONUCLEAR NO ORGANISMS SEEN CYTOSPIN SMEAR    Culture   Final    NO GROWTH 3 DAYS Performed at  [facility], [place] [address], [place], [place] [address]    Report Status 06/15/2021 FINAL  Final  Culture, blood (routine x 2)     Status: None (Preliminary result)   Collection Time: 06/12/21  2:47 PM   Specimen: BLOOD RIGHT ARM  Result Value Ref Range Status   Specimen Description BLOOD RIGHT ARM  Final   Special Requests   Final    BOTTLES DRAWN AEROBIC AND ANAEROBIC Blood Culture adequate volume   Culture   Final    NO GROWTH 4 DAYS Performed at [facility], [place] [address], [place], [place] [address]    Report Status PENDING  Incomplete  Culture, blood (routine x 2)     Status: None (Preliminary result)   Collection Time: 06/12/21  2:55 PM   Specimen: BLOOD LEFT ARM  Result Value Ref Range Status   Specimen Description BLOOD LEFT ARM  Final   Special Requests   Final    BOTTLES DRAWN AEROBIC AND ANAEROBIC Blood Culture adequate volume   Culture   Final    NO GROWTH 4 DAYS Performed at [facility], [place] [address] [place], [place] [address]    Report Status PENDING  Incomplete    Pertinent  Lab. CBC Latest Ref Rng & Units 06/16/2021 06/15/2021 06/14/2021  WBC 4.0 - 10.5 K/uL 6.0 7.1 7.0  Hemoglobin 13.0 - 17.0 g/dL 10.5(L) 11.1(L) 11.1(L)  Hematocrit 39.0 - 52.0 % 30.6(L) 32.3(L) 31.5(L)  Platelets 150 - 400 K/uL 137(L) 148(L) 147(L)    CMP Latest Ref Rng & Units 06/16/2021 06/15/2021 06/14/2021  Glucose 70 - 99 mg/dL 115(H) 122(H) 104(H)  BUN 8 - 23 mg/dL '14 14 17  '$ Creatinine 0.61 - 1.24 mg/dL 1.02 0.98 0.83  Sodium 135 - 145 mmol/L 131(L) 131(L) 130(L)  Potassium 3.5 - 5.1 mmol/L 4.1 4.8 4.3  Chloride 98 - 111 mmol/L 102 102 102  CO2 22 - 32 mmol/L 24 24 21(L)  Calcium 8.9 - 10.3 mg/dL 9.1 9.3 9.1  Total Protein 6.5 - 8.1 g/dL 5.6(L) 5.4(L) 5.7(L)  Total Bilirubin 0.3 - 1.2 mg/dL 1.4(H) 1.9(H) 1.3(H)  Alkaline Phos 38 - 126 U/L 105 110 113  AST 15 - 41 U/L '16 16 17  '$ ALT 0 - 44 U/L '21 21 22     '$ Pertinent Imaging today Plain films and CT images have been personally visualized and interpreted; radiology reports have been reviewed. Decision making incorporated into the Impression / Recommendations.  I spent more than 35 minutes for this patient encounter including review of prior medical records, coordination of care  with greater than 50% of time being face to face/counseling and discussing diagnostics/treatment plan with the patient/family.  Electronically signed by:   Rosiland Oz, MD Infectious Disease Physician [facility] Infect[facility] Pager: [phone]

## 2021-06-16 NOTE — Progress Notes (Addendum)
PROGRESS NOTE        PATIENT DETAILS Name: Parker Odonnell. Age: 87 y.o. Sex: male Date of Birth: May 02, 1932 Admit Date: 06/10/2021 Admitting Physician Evalee Mutton Kristeen Mans, MD [id number], Dwyane Luo, MD  Brief Narrative: Patient is a 87 y.o. male persistent atrial fibrillation on Eliquis, HFrEF, HTN who presented to the hospital with right leg weakness and a purpuric right leg rash.  Given broad differential diagnosis (infectious/autoimmune/paraneoplastic/structural)-admitted to the [facility] service for further evaluation.  Work-up in progress-see below for results-ID/neurology following.  Significant events: 8/14>> presented to ED with right leg weakness/pain-fall-found to have rash-thought to have shingles-started on Valtrex and discharged  8/23>> admit to [facility] for right leg weakness/purpuric right leg rash eval/management 8/24>> punch biopsy by surgery 8/25>> LP-CSF w leukocytosis (lymphocytes) and elevated protein-Acyclovir started.  Significant radiological studies:  8/14>> CT head: No acute intracranial findings. 8/25>> MRI brain: No evidence of encephalitis/paraneoplastic disease/intracranial infection. 8/25>> MRA brain: No evidence of CNS vasculitis-no major stenosis. 8/25>> MRI thoracic/lumbar-spine: Compression fracture superior endplate of L1, acute fracture of left sacrum, mild edema within spinous process of T4/T5.  No MRI changes to suggest myelopathy. 8/27>> CT chest: 5 mm nodule in right major fissure-possibly intrapulmonary lymph node 8/27>> CT abdomen: No mass/adenopathy-Marked prostate enlargement-mild bilateral hydronephrosis.  Significant serological studies: 8/23>> TSH: Within normal limit 8/24>> RA factor: Negative 8/24>> ANA: Negative 8/24>> ds DNA antibody/anti-smooth antibody: Negative 8/24>> Sjogren's A/Sjogren's B antibody: Negative 8/24>> anti-smooth muscle antibody: Negative 8/24>> ANCA antibodies: Negative 8/24>> cardiolipin  antibodies: Negative 8/24>> Lyme antibody/PCR: Negative 8/24>> C3/C4/total complement: Normal limits 8/24>> C1 esterase inhibitor: Normal limit 8/24>> cryoglobulin: Pending 8/24>> SPEP/UPEP: No M spike noted.  No evidence for monoclonal antibodies 8/24>> Lupus anticoagulant: Not detected 8/25>>CSF Oligoclonal bands/IgG index:pending 8/27>>Vitamin B12: 998  Microbiology data: 8/23>> COVID PCR: Negative 8/23>> blood culture: No growth 8/24>> acute hepatitis panel: Negative 8/24>> HIV: Negative 8/25>> blood culture: No growth 8/25>> CSF cryptococcal antigen: Negative 8/25>> CSF culture: No growth for 3 days 8/25>> CSF VDRL: Non reactive 8/25>> CSF VZV PCR: Pending 8/25>> CSF HSV PCR: Negative 8/25>>CSF ARUP Encephalitis panel:Pending 8/26>>RPR:Non reactive  Pathology data: 8/24>> skin biopsy: Pending 8/25>> CSF cytology: negative for malignancy  Antimicrobial therapy: Acyclovir: 8/25>>  Procedures : 8/24>> punch biopsy performed by general surgery: Pending biopsy results 8/25>> lumbar puncture by neurology  Consults: Neuro, ID, general surgery  DVT Prophylaxis : Eliquis on hold-neurology contemplating repeat LP for flow cytometry.  Subjective: Patient has not noticed any improvement in his lower extremity weakness.  Complains of pain but unable to specify where exactly it is that he is hurting.  Could be pain in his skin on the foot.  No obvious lesions noted over the foot.  Able to move his ankle joint without difficulty.     Assessment/Plan:  Bilateral lower extremity weakness, right more than left/purpuric right lower extremity rash: Etiology unclear-differentials include infectious/vasculitis/paraneoplastic-extensive work-up in process.  Imaging studies so far unrevealing.  Autoimmune work-up negative so far.  Skin biopsy results still pending.   No masses or lymphadenopathy is seen on CT chest/abdomen.  Given lymphocytic pleocytosis and elevated protein on CSF  studies-empirically on acyclovir.  HSV PCR negative.  VZV PCR is still pending.  Continue acyclovir for now.  Neurology is planning for 7 to 14-day course.  Neurology has ordered MRI pelvis. Neurology is considering repeating lumbar puncture.  Eliquis remains on hold.  Hyponatremia: Volume status is stable-although he has a history of HFrEF- wonder if he has developed SIADH physiology.  He is on low-dose IV fluid because he is on acyclovir.   CT chest without any lung mass-although has a 5 mm lung nodule versus intrapulmonary lymph node.  Sodium level has been stable.  Persistent atrial fibrillation: Rate controlled-on Coreg-Eliquis held by neurology anticipation of repeat lumbar puncture.    HFrEF: Volume status is stable-continue to hold diuretics for now.  EF 35 to 40% based on echocardiogram done in 2020.  History of CAD-s/p remote PCI: No anginal symptoms.  Hyperlipidemia: Continue statin  Essential hypertension: BP stable-continue losartan/Coreg-follow and adjust.  Large prostate with mild hydronephrosis seen on CT abdomen: Renal function stable-started on low-dose Flomax. ADDENDUM: MRI pelvis revealed distended bladder as seen on CT. Patient with no discomfort. Has been urinating all day. Bladder scan revealed ~818m. Unclear how much of this is chronic. No older imaging studies available. May be reasonable to discuss with Urology in AM. In and Out only if patient unable to void.  L1 superior endplate compression fracture/mild edema within spinous process of T4/T5: Seen incidentally on MRI imaging.  Supportive care.  5 mm right lower lobe pulmonary nodule versus intrapulmonary lymph node: Repeat noncontrast CT chest in 12 months to reassess.  Diet: Diet Order             Diet regular Room service appropriate? Yes; Fluid consistency: Thin  Diet effective now                    Code Status:  DNR  Family Communication: [id number] Disposition Plan: CIR  is recommended by PT and OT. Status is: Inpatient  The patient will require care spanning > 2 midnights and should be moved to inpatient because: Inpatient level of care appropriate due to severity of illness  Dispo: The patient is from: Home              Anticipated d/c is to: SNF              Patient currently is not medically stable to d/c.   Difficult to place patient No     Barriers to Discharge: Right lower extremity weakness-rash of unknown etiology-work-up in progress  Antimicrobial agents: Anti-infectives (From admission, onward)    Start     Dose/Rate Route Frequency Ordered Stop   06/16/21 2000  acyclovir (ZOVIRAX) 660 mg in dextrose 5 % 100 mL IVPB  Status:  Discontinued        660 mg 113.2 mL/hr over 60 Minutes Intravenous Every 12 hours 06/16/21 0930 06/16/21 1011   06/16/21 2000  acyclovir (ZOVIRAX) 660 mg in dextrose 5 % 100 mL IVPB        660 mg 113.2 mL/hr over 60 Minutes Intravenous Every 12 hours 06/16/21 1021     06/12/21 1815  acyclovir (ZOVIRAX) 660 mg in dextrose 5 % 100 mL IVPB  Status:  Discontinued        10 mg/kg  66.2 kg 113.2 mL/hr over 60 Minutes Intravenous Every 12 hours 06/12/21 1718 06/16/21 0930         MEDICATIONS: Scheduled Meds:  atorvastatin  40 mg Oral QHS   carvedilol  3.125 mg Oral BID WC   docusate sodium  100 mg Oral BID   feeding supplement  237 mL Oral BID BM   losartan  50 mg Oral Daily   multivitamin with minerals  1 tablet Oral Daily   mupirocin cream   Topical BID   pregabalin  50 mg Oral BID   tamsulosin  0.4 mg Oral Daily   Continuous Infusions:  sodium chloride     acyclovir     PRN Meds:.acetaminophen **OR** acetaminophen, bisacodyl, hydrALAZINE, HYDROcodone-acetaminophen, lip balm, liver oil-zinc oxide, morphine injection, ondansetron **OR** ondansetron (ZOFRAN) IV, polyethylene glycol, polyvinyl alcohol, traMADol   PHYSICAL EXAM: Vital signs: Vitals:   06/16/21 0004 06/16/21 0344 06/16/21 0741 06/16/21  0800  BP: 125/64 138/67 (!) 116/54 125/71  Pulse: 71 74 70 72  Resp: '19 18 20 14  '$ Temp: 98.6 F (37 C) 97.6 F (36.4 C) 97.6 F (36.4 C) 97.7 F (36.5 C)  TempSrc: Axillary Oral Oral Oral  SpO2: 98% 99% 97% 97%  Weight:       Filed Weights   06/14/21 0407  Weight: 67 kg   Body mass index is 22.46 kg/m.   General appearance: Awake alert.  In no distress Resp: Clear to auscultation bilaterally.  Normal effort Cardio: S1-S2 is normal regular.  No S3-S4.  No rubs murmurs or bruit GI: Abdomen is soft.  Nontender nondistended.  Bowel sounds are present normal.  No masses organomegaly Extremities: No edema.   Neurologic: Alert and oriented x3.  Weakness noted bilateral lower extremities.  Right greater than left. Purpuric rash on right lower extremity.    I have personally reviewed following labs and imaging studies  LABORATORY DATA: CBC: Recent Labs  Lab 06/10/21 1310 06/11/21 0143 06/12/21 1455 06/13/21 0224 06/14/21 0111 06/15/21 0152 06/16/21 0215  WBC 5.7   < > 5.3 6.0 7.0 7.1 6.0  NEUTROABS 4.0  --  3.6  --   --   --   --   HGB 13.7   < > 12.4* 11.3* 11.1* 11.1* 10.5*  HCT 39.6   < > 36.4* 33.3* 31.5* 32.3* 30.6*  MCV 98.0   < > 98.6 99.7 96.6 99.4 99.0  PLT 170   < > 150 148* 147* 148* 137*   < > = values in this interval not displayed.     Basic Metabolic Panel: Recent Labs  Lab 06/12/21 1455 06/13/21 0224 06/14/21 0111 06/15/21 0152 06/16/21 0215  NA 130* 128* 130* 131* 131*  K 4.0 3.9 4.3 4.8 4.1  CL 103 102 102 102 102  CO2 23 22 21* 24 24  GLUCOSE 99 119* 104* 122* 115*  BUN '12 13 17 14 14  '$ CREATININE 0.92 0.82 0.83 0.98 1.02  CALCIUM 9.1 8.8* 9.1 9.3 9.1     GFR: Estimated Creatinine Clearance: 49.3 mL/min (by C-G formula based on SCr of 1.02 mg/dL).  Liver Function Tests: Recent Labs  Lab 06/12/21 1455 06/13/21 0224 06/14/21 0111 06/15/21 0152 06/16/21 0215  AST '26 23 17 16 16  '$ ALT '27 24 22 21 21  '$ ALKPHOS 111 99 113 110 105   BILITOT 1.4* 1.9* 1.3* 1.9* 1.4*  PROT 6.3* 5.5* 5.7* 5.4* 5.6*  ALBUMIN 3.2* 3.0* 2.9* 2.9* 2.9*     Cardiac Enzymes: Recent Labs  Lab 06/11/21 1406  CKTOTAL 62       Anemia Panel: Recent Labs    06/14/21 1824  VITAMINB12 998*     Urine analysis:    Component Value Date/Time   COLORURINE YELLOW 06/10/2021 2100   APPEARANCEUR HAZY (A) 06/10/2021 2100   LABSPEC 1.008 06/10/2021 2100   PHURINE 7.0 06/10/2021 2100   GLUCOSEU NEGATIVE 06/10/2021 2100   [place] NEGATIVE 06/10/2021 2100  [place] NEGATIVE 06/10/2021 2100   KETONESUR NEGATIVE 06/10/2021 2100   PROTEINUR NEGATIVE 06/10/2021 2100   UROBILINOGEN 0.2 06/13/2012 1027   NITRITE NEGATIVE 06/10/2021 2100   LEUKOCYTESUR MODERATE (A) 06/10/2021 2100      MICROBIOLOGY: Recent Results (from the past 240 hour(s))  Culture, blood (routine x 2)     Status: None   Collection Time: 06/10/21 12:55 PM   Specimen: BLOOD  Result Value Ref Range Status   Specimen Description BLOOD BLOOD RIGHT FOREARM  Final   Special Requests   Final    BOTTLES DRAWN AEROBIC AND ANAEROBIC Blood Culture adequate volume   Culture   Final    NO GROWTH 5 DAYS Performed at [facility], [address], [place], [place] [address]    Report Status 06/15/2021 FINAL  Final  Culture, blood (routine x 2)     Status: None   Collection Time: 06/10/21  1:10 PM   Specimen: BLOOD  Result Value Ref Range Status   Specimen Description BLOOD RIGHT ANTECUBITAL  Final   Special Requests   Final    BOTTLES DRAWN AEROBIC AND ANAEROBIC Blood Culture adequate volume   Culture   Final    NO GROWTH 5 DAYS Performed at [facility], [place] [address], [place], [place] [address]    Report Status 06/15/2021 FINAL  Final  SARS CORONAVIRUS 2 (TAT 6-24 HRS) Nasopharyngeal Nasopharyngeal Swab     Status: None   Collection Time: 06/10/21  7:11 PM   Specimen: Nasopharyngeal Swab  Result Value Ref Range Status   SARS Coronavirus 2 NEGATIVE  NEGATIVE Final    Comment: (NOTE) SARS-CoV-2 target nucleic acids are NOT DETECTED.  The SARS-CoV-2 RNA is generally detectable in upper and lower respiratory specimens during the acute phase of infection. Negative results do not preclude SARS-CoV-2 infection, do not rule out co-infections with other pathogens, and should not be used as the sole basis for treatment or other patient management decisions. Negative results must be combined with clinical observations, patient history, and epidemiological information. The expected result is Negative.  Fact Sheet for Patients: SugarRoll.be  Fact Sheet for Healthcare Providers: https://www.woods-mathews.com/  This test is not yet approved or cleared by the [place] FDA and  has been authorized for detection and/or diagnosis of SARS-CoV-2 by FDA under an Emergency Use Authorization (EUA). This EUA will remain  in effect (meaning this test can be used) for the duration of the COVID-19 declaration under Se ction 564(b)(1) of the Act, 21 U.S.C. section 360bbb-3(b)(1), unless the authorization is terminated or revoked sooner.  Performed at [facility], [place] [address] [place], [place] [address]   Aerobic/Anaerobic Culture w Gram Stain (surgical/deep wound)     Status: None (Preliminary result)   Collection Time: 06/11/21 11:23 AM   Specimen: Tissue  Result Value Ref Range Status   Specimen Description TISSUE  Final   Special Requests SKIN EXCISIONAL BIOPSY  Final   Gram Stain   Final    RARE WBC PRESENT,BOTH PMN AND MONONUCLEAR NO ORGANISMS SEEN    Culture   Final    CULTURE REINCUBATED FOR BETTER GROWTH Performed at [facility], [place] [address] [place], [place] [address]    Report Status PENDING  Incomplete  CSF culture w Stat Gram Stain     Status: None   Collection Time: 06/12/21  1:36 PM   Specimen: CSF; Cerebrospinal Fluid  Result Value Ref Range Status   Specimen  Description  CSF  Final   Special Requests NONE  Final   Gram Stain   Final    WBC PRESENT, PREDOMINANTLY MONONUCLEAR NO ORGANISMS SEEN CYTOSPIN SMEAR    Culture   Final    NO GROWTH 3 DAYS Performed at [facility], [address], [place], [place] [address]    Report Status 06/15/2021 FINAL  Final  Culture, blood (routine x 2)     Status: None (Preliminary result)   Collection Time: 06/12/21  2:47 PM   Specimen: BLOOD RIGHT ARM  Result Value Ref Range Status   Specimen Description BLOOD RIGHT ARM  Final   Special Requests   Final    BOTTLES DRAWN AEROBIC AND ANAEROBIC Blood Culture adequate volume   Culture   Final    NO GROWTH 4 DAYS Performed at [facility], [place] [address] [place],  [address]    Report Status PENDING  Incomplete  Culture, blood (routine x 2)     Status: None (Preliminary result)   Collection Time: 06/12/21  2:55 PM   Specimen: BLOOD LEFT ARM  Result Value Ref Range Status   Specimen Description BLOOD LEFT ARM  Final   Special Requests   Final    BOTTLES DRAWN AEROBIC AND ANAEROBIC Blood Culture adequate volume   Culture   Final    NO GROWTH 4 DAYS Performed at [facility], [place] [address] [place],  [address]    Report Status PENDING  Incomplete    RADIOLOGY STUDIES/RESULTS: CT CHEST ABDOMEN PELVIS W CONTRAST  Result Date: 06/14/2021 CLINICAL DATA:  L1 and sacral fractures, possible paraneoplastic syndrome EXAM: CT CHEST, ABDOMEN, AND PELVIS WITH CONTRAST TECHNIQUE: Multidetector CT imaging of the chest, abdomen and pelvis was performed following the standard protocol during bolus administration of intravenous contrast. CONTRAST:  58m OMNIPAQUE IOHEXOL 350 MG/ML SOLN COMPARISON:  Thoracolumbar MRI 06/12/2021, and previous FINDINGS: CT CHEST FINDINGS Cardiovascular: Mild cardiomegaly with biatrial enlargement. Scattered coronary calcifications. Aortic Atherosclerosis (ICD10-170.0). Mediastinum/Nodes: No mass or adenopathy.  Lungs/Pleura: No pleural effusion. No pneumothorax. Partially calcified pleural-based 4 mm granuloma, anterior right upper lobe. 5 mm angular nodule adjacent to the right major fissure possibly intrapulmonary lymph node but nonspecific. Coarse scarring or atelectasis in the lung bases. Musculoskeletal: Shoulder DJD left worse than right. Probable chronic bilateral rib fracture deformities. No acute fracture or worrisome bone lesion. CT ABDOMEN PELVIS FINDINGS Hepatobiliary: No focal liver abnormality is seen. Status post cholecystectomy. No biliary dilatation. Pancreas: Unremarkable. No pancreatic ductal dilatation or surrounding inflammatory changes. Spleen: Normal in size without focal abnormality. Adrenals/Urinary Tract: 4.9 cm probable cyst, upper pole left kidney. There is mild bilateral hydronephrosis and ureterectasis without any definite urolithiasis. The urinary bladder is distended above the level of the umbilicus. Stomach/Bowel: Small hiatal hernia. The stomach is incompletely distended. Small bowel decompressed. Appendix not discretely identified. No pericecal inflammatory/edematous changes. Innumerable descending and sigmoid diverticula without significant adjacent inflammatory change. Vascular/Lymphatic: Scattered calcified aortoiliac plaque without aneurysm. Portal vein patent. No abdominal or pelvic adenopathy localized. Reproductive: Marked prostate enlargement. Other: No ascites.  No free air. Musculoskeletal: L1 superior endplate subacute compression fracture deformity with approximally the 30% loss of height anteriorly, no retropulsion. Spondylitic changes in the lower lumbar spine. Left hip arthroplasty hardware resulting in streak artifact degrading portions of the scan. Right hip DJD. IMPRESSION: 1. No mass or adenopathy. 2. Marked prostate enlargement with distended urinary bladder, which may account for the bilateral hydronephrosis and ureterectasis in the absence of  urolithiasis. 3.  Descending and sigmoid diverticulosis 4. 5 mm right lower lobe pulmonary nodule. No follow-up needed if patient is low-risk. Non-contrast chest CT can be considered in 12 months if patient is high-risk. This recommendation follows the consensus statement: Guidelines for Management of Incidental Pulmonary Nodules Detected on CT Images: From the Fleischner Society 2017; Radiology 2017; 284:228-243. Electronically Signed   By: Lucrezia Europe M.D.   On: 06/14/2021 13:52   VAS [place] LOWER EXTREMITY VENOUS (DVT)  Result Date: 06/15/2021  Lower Venous DVT Study Patient Name:  Parker Odonnell.  Date of Exam:   06/15/2021 Medical Rec #: [id number]          Accession #:    [id number] Date of Birth: [birth year]         Patient Gender: M Patient Age:   12 years Exam Location:  [facility] Procedure:      VAS [place] LOWER EXTREMITY VENOUS (DVT) Referring Phys: Lesleigh Noe --------------------------------------------------------------------------------  Indications: Swelling.  Comparison Study: No prior studies. Performing Technologist: Darlin Coco RDMS, RVT  Examination Guidelines: A complete evaluation includes B-mode imaging, spectral Doppler, color Doppler, and power Doppler as needed of all accessible portions of each vessel. Bilateral testing is considered an integral part of a complete examination. Limited examinations for reoccurring indications may be performed as noted. The reflux portion of the exam is performed with the patient in reverse Trendelenburg.  +---------+---------------+---------+-----------+----------+--------------+ RIGHT    CompressibilityPhasicitySpontaneityPropertiesThrombus Aging +---------+---------------+---------+-----------+----------+--------------+ CFV      Full           Yes      Yes                                 +---------+---------------+---------+-----------+----------+--------------+ SFJ      Full                                                         +---------+---------------+---------+-----------+----------+--------------+ FV Prox  Full                                                        +---------+---------------+---------+-----------+----------+--------------+ FV Mid   Full                                                        +---------+---------------+---------+-----------+----------+--------------+ FV DistalFull                                                        +---------+---------------+---------+-----------+----------+--------------+ PFV      Full                                                        +---------+---------------+---------+-----------+----------+--------------+  POP      Full           Yes      Yes                                 +---------+---------------+---------+-----------+----------+--------------+ PTV      Full                                                        +---------+---------------+---------+-----------+----------+--------------+ PERO     Full                                                        +---------+---------------+---------+-----------+----------+--------------+   +----+---------------+---------+-----------+----------+--------------+ LEFTCompressibilityPhasicitySpontaneityPropertiesThrombus Aging +----+---------------+---------+-----------+----------+--------------+ CFV Full           Yes      Yes                                 +----+---------------+---------+-----------+----------+--------------+     Summary: RIGHT: - There is no evidence of deep vein thrombosis in the lower extremity.  - No cystic structure found in the popliteal fossa.  LEFT: - No evidence of common femoral vein obstruction.  *See table(s) above for measurements and observations. Electronically signed by Monica Martinez MD on 06/15/2021 at 2:27:00 PM.    Final      LOS: 5 days   Bonnielee Haff, MD  [facility]    To contact the attending provider between  7A-7P or the covering provider during after hours 7P-7A, please log into the web site [email or web address] and access using universal [place] password for that web site. If you do not have the password, please call the hospital operator.  06/16/2021, 10:55 AM

## 2021-06-16 NOTE — Progress Notes (Signed)
We went in to contrast pt and his IV was occluded. We made two attempts to start a new IV with no success. Pt was getting agitated so his exam was completed without contrast.

## 2021-06-16 NOTE — Progress Notes (Signed)
Occupational Therapy Treatment Patient Details Name: Parker Odonnell. MRN: [id number] DOB: 23-May-1932 Today's Date: 06/16/2021    History of present illness 87 y.o. male  presented 06/10/21 with rash, weakness. 06/13/21 MRI brain no acute anbormality; MRI spine L1 compression fx, no cord signal changes  PMH significant of CAD; CHF; HLD; afib on Eliquis; and HTN   OT comments  OT concentrated on standing for toilet hygiene, transfer to eob, and bed mobility.  Patient had complaints of "bottom" hurting and did not feel he was clean from toilet hygiene earlier. Patient stood from chair for toilet hygiene and limited tolerance. Transfer to eob with rw and max assist due to fear and pain.  Patient was anxious to lie in bed and required mod assist for safety to get to supine.   Follow Up Recommendations  [facility]    Equipment Recommendations  Wheelchair (measurements OT);Wheelchair cushion (measurements OT)    Recommendations for Other Services      Precautions / Restrictions Precautions Precautions: Fall Precaution Comments: per son, 8/14 after legs began feeling weak       Mobility Bed Mobility Overal bed mobility: Needs Assistance Bed Mobility: Sit to Supine     Supine to sit: Mod assist;HOB elevated Sit to supine: Mod assist   General bed mobility comments:  (Patient was anxious to return to supine and required mod assist for safety)    Transfers Overall transfer level: Needs assistance Equipment used: Rolling walker (2 wheeled) Transfers: Sit to/from Stand Sit to Stand: Mod assist Stand pivot transfers: Max assist       General transfer comment:  (Max assist of one due to fear of falling and unsteady balance)    Balance Overall balance assessment: Needs assistance Sitting-balance support: Feet supported Sitting balance-Leahy Scale: Fair Sitting balance - Comments:  (min guard)   Standing balance support: Bilateral upper extremity supported;During functional  activity Standing balance-Leahy Scale: Poor Standing balance comment:  (Unsteady balance)                           ADL either performed or assessed with clinical judgement   ADL Overall ADL's : Needs assistance/impaired Eating/Feeding: Set up;Sitting           Lower Body Bathing: Maximal assistance;Sit to/from stand Lower Body Bathing Details (indicate cue type and reason):  (Patient stood at [facility] to perform perineal cleaning with max assist)                             Vision       Perception     Praxis      Cognition Arousal/Alertness: Awake/alert Behavior During Therapy: Agitated Overall Cognitive Status: Within Functional Limits for tasks assessed                                 General Comments:  (Patient was aggitated due to complaints of pain from sitting in chair)        Exercises     Shoulder Instructions       General Comments      Pertinent Vitals/ Pain       Pain Assessment: Faces Faces Pain Scale: Hurts little more Pain Location:  (Buttocks) Pain Descriptors / Indicators: Sore Pain Intervention(s): Repositioned  Home Living  Prior Functioning/Environment              Frequency  Min 2X/week        Progress Toward Goals  OT Goals(current goals can now be found in the care plan section)  Progress towards OT goals: Progressing toward goals  Acute Rehab OT Goals Patient Stated Goal: Need to move better OT Goal Formulation: With patient Time For Goal Achievement: 06/25/21 Potential to Achieve Goals: Fair ADL Goals Pt Will Perform Grooming: sitting;standing;with min guard assist Pt Will Perform Lower Body Bathing: with min assist;sit to/from stand Pt Will Perform Lower Body Dressing: with min assist;sit to/from stand Pt Will Transfer to Toilet: with min assist;ambulating;regular height toilet  Plan Discharge plan remains appropriate     Co-evaluation                 AM-PAC OT "6 Clicks" Daily Activity     Outcome Measure   Help from another person eating meals?: None Help from another person taking care of personal grooming?: None Help from another person toileting, which includes using toliet, bedpan, or urinal?: A Lot Help from another person bathing (including washing, rinsing, drying)?: A Lot Help from another person to put on and taking off regular upper body clothing?: A Little Help from another person to put on and taking off regular lower body clothing?: A Lot 6 Click Score: 17    End of Session Equipment Utilized During Treatment: Gait belt;Rolling walker  OT Visit Diagnosis: Unsteadiness on feet (R26.81);Other abnormalities of gait and mobility (R26.89);Muscle weakness (generalized) (M62.81);Pain   Activity Tolerance Patient limited by fatigue;Patient limited by pain   Patient Left in bed;with call bell/phone within reach   Nurse Communication Mobility status        Time: 1025-1050 OT Time Calculation (min): 25 min  Charges: OT General Charges $OT Visit: 1 Visit OT Treatments $Self Care/Home Management : 8-22 mins $Therapeutic Activity: 8-22 mins  Lodema Hong, OTA    Geoffry Bannister Alexis Goodell 06/16/2021, 1:48 PM

## 2021-06-16 NOTE — Progress Notes (Addendum)
   06/16/21 1807  Urine Characteristics  Bladder Scan Volume (mL) 729 mL   Dr. Maryland Pink notified   1823 per Dr. Vernell Barrier hold off on in and out catheter at this time, as pt does not have any discomfort at this time

## 2021-06-17 DIAGNOSIS — B0111 Varicella encephalitis and encephalomyelitis: Secondary | ICD-10-CM

## 2021-06-17 DIAGNOSIS — R29898 Other symptoms and signs involving the musculoskeletal system: Secondary | ICD-10-CM

## 2021-06-17 LAB — URINALYSIS, ROUTINE W REFLEX MICROSCOPIC
Bilirubin Urine: NEGATIVE
Glucose, UA: NEGATIVE mg/dL
Hgb urine dipstick: NEGATIVE
Ketones, ur: NEGATIVE mg/dL
Leukocytes,Ua: NEGATIVE
Nitrite: NEGATIVE
Protein, ur: NEGATIVE mg/dL
Specific Gravity, Urine: 1.006 (ref 1.005–1.030)
pH: 7 (ref 5.0–8.0)

## 2021-06-17 LAB — CULTURE, BLOOD (ROUTINE X 2)
Culture: NO GROWTH
Culture: NO GROWTH
Special Requests: ADEQUATE
Special Requests: ADEQUATE

## 2021-06-17 LAB — AEROBIC/ANAEROBIC CULTURE W GRAM STAIN (SURGICAL/DEEP WOUND)

## 2021-06-17 LAB — SURGICAL PATHOLOGY

## 2021-06-17 LAB — OSMOLALITY, URINE: Osmolality, Ur: 185 mOsm/kg — ABNORMAL LOW (ref 300–900)

## 2021-06-17 LAB — CREATININE, URINE, RANDOM: Creatinine, Urine: 43.82 mg/dL

## 2021-06-17 LAB — COMPLEMENT COMPONENT C1Q: C1q Complement Protein CC1Q: 9.6 mg/dL — ABNORMAL LOW (ref 10.2–20.3)

## 2021-06-17 LAB — SODIUM, URINE, RANDOM: Sodium, Ur: 11 mmol/L

## 2021-06-17 LAB — URIC ACID: Uric Acid, Serum: 5 mg/dL (ref 3.7–8.6)

## 2021-06-17 LAB — OSMOLALITY: Osmolality: 285 mOsm/kg (ref 275–295)

## 2021-06-17 MED ORDER — CHLORHEXIDINE GLUCONATE CLOTH 2 % EX PADS
6.0000 | MEDICATED_PAD | Freq: Every day | CUTANEOUS | Status: DC
  Administered 2021-06-17 – 2021-06-21 (×5): 6 via TOPICAL

## 2021-06-17 MED ORDER — LACTATED RINGERS IV SOLN: INTRAVENOUS | Status: DC

## 2021-06-17 NOTE — TOC Progression Note (Signed)
Transition of Care (TOC) - Progression Note    Patient Details  Name: Staten Salmo. MRN: [id number] Date of Birth: 18-May-1932  Transition of Care [facility]) CM/SW [place], LCSW Phone Number: 06/17/2021, 2:02 PM  Clinical Narrative:    CSW spoke with patient's daughter, [name], and explained SNF process if [facility] is unable to accept patient. She stated she had missed a call from [facility] and she is about to return it as well. CSW emailed her the SNF bed offers and Medicare ratings list.     Barriers to Discharge: Continued Medical Work up, Orthoptist and Services   In-house Referral: Clinical Social Work   Post Acute Care Choice: [place], [place] Living arrangements for the past 2 months: [place]                                       Social Determinants of Health (SDOH) Interventions    Readmission Risk Interventions No flowsheet data found.

## 2021-06-17 NOTE — Progress Notes (Signed)
Inpatient Rehab Admissions Coordinator:   Continue to follow for diagnosis.  Left daughter a voicemail to discuss inpatient rehab recommendations.    Shann Medal, PT, DPT Admissions Coordinator [phone] 06/17/21  1:47 PM

## 2021-06-17 NOTE — Progress Notes (Signed)
Neurology Progress Note  Brief HPI: 87 y.o. 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 Previous ED evaluation on 8/14 patient was diagnosed with possible shingles for a rash on the RLE and started on valacyclovir. His weakness continued to worsen with bilateral R > L lower extremity weakness and neurology was consulted for further evaluation of weakness. On further conversation with the patient's family, it appears that weakness has actually been constant since 8/14 and has not worsened.  Subjective: Patient states his weakness feels slightly improved relative to yesterday. No new neurologic complaints toady.  Exam: Vitals:   06/16/21 2347 06/17/21 0333  BP: 130/71 110/81  Pulse: 72 75  Resp: 17 17  Temp: 98.2 F (36.8 C) 97.8 F (36.6 C)  SpO2: 98% 90%    Gen: Sitting up in bedside recliner, in no acute distress Resp: non-labored breathing, no respiratory distress on room air Abd: soft, non-tender, non-distended Extremities: The right lower extremity is notably more edematous than the left Spine: There is no tenderness to palpation along the entirety of the spine Skin: In addition to the rash as described on the right lower extremity, there are several spots in the left upper thigh as well as just above the sacrum that appear similar.   Neuro: Mental Status: Awake, alert, and oriented to person, place, time, and situation He is able to provide some details about the history of his present illness but replies "I don't know" to many questions Speech is intact without dysarthria.  No aphasia or neglect noted in casual conversation Cranial Nerves: PERRL, EOMI without ptosis, facial sensation intact and symmetric to light touch, face is has a slight right droop at  rest and with activation, hearing is intact to voice, shoulders shrug symmetrically, phonation is normal, palate rises symmetrically, tongue protrudes midline.  Motor: At rest he makes many adventitious movements of all 4 extremities that he reports is "just loosening up" he has some slight paratonia on tone testing but no clear spasticity.  5/5 in the left upper extremity, very subtle weakness 4++/5 in the right upper extremity.  When laying in bed he cannot lift the right hip antigravity, but seated on the side of the bed he is 4 -/5 in the right hip flexion, 4+/5 in the left hip flexion.  He is otherwise 4+/5 in knee flexion and extension on the right and 5/5 in knee flexion and extension on the left. Sensory: He has a length dependent loss of pinprick and cold temperature in the bilateral arms and legs, as well as a severe allodynia in the bilateral lower extremities below the knees.  He has diminished vibration sensation (absent at the toes, 6 seconds at the knees, 9 seconds at the hands).  Additionally to sharp sensation tested along the trunk, he appears to have approximately a T8 sensory level on the back and T5 in the front  DTR: 2+ and symmetric biceps, 1+ and symmetric patellae, stable Gait: Patient seated at the side of the bed but reported he did not feel safe ambulating with only 1 person assist and therefore this was deferred  Pertinent Labs: CBC    Component Value Date/Time   WBC 6.0 06/16/2021 0215   RBC 3.09 (L) 06/16/2021 0215   HGB 10.5 (L) 06/16/2021 0215   HGB  13.0 11/10/2019 1246   HCT 30.6 (L) 06/16/2021 0215   HCT 38.9 11/10/2019 1246   PLT 137 (L) 06/16/2021 0215   PLT 142 (L) 11/10/2019 1246   MCV 99.0 06/16/2021 0215   MCV 102 (H) 11/10/2019 1246   MCH 34.0 06/16/2021 0215   MCHC 34.3 06/16/2021 0215   RDW 13.5 06/16/2021 0215   RDW 12.1 11/10/2019 1246   LYMPHSABS 0.9 06/12/2021 1455   LYMPHSABS 1.2 12/23/2016 0804   MONOABS 0.6 06/12/2021 1455   EOSABS 0.2  06/12/2021 1455   EOSABS 0.4 12/23/2016 0804   BASOSABS 0.0 06/12/2021 1455   BASOSABS 0.0 12/23/2016 0804   CMP     Component Value Date/Time   NA 131 (L) 06/16/2021 0215   NA 139 11/10/2019 1246   K 4.1 06/16/2021 0215   CL 102 06/16/2021 0215   CO2 24 06/16/2021 0215   GLUCOSE 115 (H) 06/16/2021 0215   BUN 14 06/16/2021 0215   BUN 12 11/10/2019 1246   CREATININE 1.02 06/16/2021 0215   CREATININE 0.96 12/19/2015 0825   CALCIUM 9.1 06/16/2021 0215   PROT 5.6 (L) 06/16/2021 0215   PROT 6.5 12/23/2016 0804   ALBUMIN 2.9 (L) 06/16/2021 0215   ALBUMIN 4.0 06/12/2021 1335   AST 16 06/16/2021 0215   ALT 21 06/16/2021 0215   ALKPHOS 105 06/16/2021 0215   BILITOT 1.4 (H) 06/16/2021 0215   BILITOT 1.5 (H) 12/23/2016 0804   GFRNONAA >60 06/16/2021 0215   GFRAA >60 01/12/2020 0117   HIV 06/12/2021: Non Reactive Lyme disease serology: Negative  Results for KIRIAKOS, MARIA (MRN [id number]) as of 06/13/2021 09:15  Ref. Range 06/12/2021 13:34 06/12/2021 13:35  Appearance, CSF Latest Ref Range: CLEAR  CLEAR (A)   Glucose, CSF Latest Ref Range: 40 - 70 mg/dL  55  RBC Count, CSF Latest Ref Range: 0 /cu mm 25 (H)   WBC, CSF Latest Ref Range: 0 - 5 /cu mm 69 (HH)   Segmented Neutrophils-CSF Latest Ref Range: 0 - 6 % 0   Lymphs, CSF Latest Ref Range: 40 - 80 % 89 (H)   Monocyte-Macrophage-Spinal Fluid Latest Ref Range: 15 - 45 % 11 (L)   Color, CSF Latest Ref Range: COLORLESS  COLORLESS   Supernatant Unknown NOT INDICATED   Total  Protein, CSF Latest Ref Range: 15 - 45 mg/dL  75 (H)  Tube # Unknown 1    Results for BORUCH, MASCORRO (MRN [id number]) as of 06/13/2021 09:15  Ref. Range 06/11/2021 14:06  Anti Nuclear Antibody (ANA) Latest Ref Range: Negative  Negative  ds DNA Ab Latest Ref Range: 0 - 9 IU/mL <1  RA Latex Turbid. Latest Ref Range: <14.0 IU/mL <10.0  Cytoplasmic (C-ANCA) Latest Ref Range: Neg:<1:20 titer <1:20  P-ANCA Latest Ref Range: Neg:<1:20 titer <1:20  Atypical P-ANCA  titer Latest Ref Range: Neg:<1:20 titer <1:20  ENA SM Ab Ser-aCnc Latest Ref Range: 0.0 - 0.9 AI <0.2  Anti-MPO Antibodies Latest Ref Range: 0.0 - 0.9 units <0.2  Anti-PR3 Antibodies Latest Ref Range: 0.0 - 0.9 units <0.2  C1INH SerPl-mCnc Latest Ref Range: 21 - 39 mg/dL 25  C3 Complement Latest Ref Range: 82 - 167 mg/dL 107  Compl, Total (CH50) Latest Ref Range: >41 U/mL 50  Complement C4, Body Fluid Latest Ref Range: 12 - 38 mg/dL 21  Lyme Total Antibody EIA Latest Ref Range: Negative  Negative  SSA (Ro) (ENA) Antibody, IgG Latest Ref Range: 0.0 - 0.9 AI <0.2  SSB (  La) (ENA) Antibody, IgG Latest Ref Range: 0.0 - 0.9 AI <0.2  Hep A Ab, IgM Latest Ref Range: NON REACTIVE  NON REACTIVE  Hepatitis B Surface Ag Latest Ref Range: NON REACTIVE  NON REACTIVE  Hep B Core Ab, IgM Latest Ref Range: NON REACTIVE  NON REACTIVE  HCV Ab Latest Ref Range: NON REACTIVE  NON REACTIVE  HIV Screen 4th Generation wRfx Latest Ref Range: Non Reactive  Non Reactive   CSF culture pending CSF gram stain: no organisms seen, WBC present, predominantly mononuclear Cryptococcal antigen CSF negative CSF cytology -- negative for malignant cells  UIFE unremarkable  Interim lab results: RPR nonreactive, IgG index WNL, HSV 1/2 PCR neg Pending: VZV CSF send-out, OCB, protein electropheresis, CSF culture, lupus anticoagulant panel, [facility] meningitis panel,  Lyme disease DNA by PCR   Lab Results  Component Value Date   ESRSEDRATE 15 06/10/2021   Lab Results  Component Value Date   CRP 0.6 06/10/2021   Lab Results  Component Value Date   VITAMINB12 998 (H) 06/14/2021     Pertinent imaging:  MRI brain [facility] 06/13/2021: 1. No acute intracranial abnormality. Specifically, no MRI evidence for encephalitis, paraneoplastic disease, or intracranial infection. 2. 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 No associated mass effect. This is  felt to be incidental in nature and of doubtful significance. 3. Otherwise normal brain MRI for age. 4. Small right greater than left mastoid effusions.  MRA head WO contrast 06/13/2021: Normal intracranial MRA. No MRI evidence for CNS vasculitis.  MRI thoracic and lumbar spine WWO contrast 06/13/2021: 1. Acute compression fracture involving the superior endplate of L1 with up to 35% height loss without significant bony retropulsion. 2. Additional probable acute fracture of the left sacrum, partially visualized. Follow-up with dedicated CT and/or MRI of the sacrum could be performed for further evaluation as warranted. 3. Mild edema within the spinous processes of T4 and T5, of uncertain etiology, but could reflect additional subtle fractures given the above findings. Correlation with physical exam for possible pain at this location recommended. 4. Normal MRI appearance of the thoracic spinal cord, conus medullaris, and cauda equina. No cord signal changes to suggest myelopathy. No abnormal enhancement. 5. Moderate lumbar spondylosis with resultant moderate spinal stenosis at L1-2 and L3-4. Additional more mild spondylosis elsewhere within the thoracolumbar spine as above. No other significant stenosis or neural impingement. 6. Prominent distension of the urinary bladder. Clinical correlation for possible urinary retention recommended.  CT C/A/P w/ contrast Musculoskeletal: L1 superior endplate subacute compression fracture deformity with approximally the 30% loss of height anteriorly, no retropulsion. Spondylitic changes in the lower lumbar spine. Left hip arthroplasty hardware resulting in streak artifact degrading portions of the scan. Right hip DJD. IMPRESSION: 1. No mass or adenopathy. 2. Marked prostate enlargement with distended urinary bladder, which may account for the bilateral hydronephrosis and ureterectasis in the absence of urolithiasis.  3. Descending and sigmoid diverticulosis 4.  5 mm right lower lobe pulmonary nodule. No follow-up needed if patient is low-risk. Non-contrast chest CT can be considered in 12 months if patient is high-risk. This recommendation follows the consensus statement: Guidelines for Management of Incidental Pulmonary Nodules Detected on CT Images: From the Fleischner Society 2017; Radiology 2017; 284:228-243.  Assessment: 87 year old male with history of squamous cell carcinoma of back status-post resection, atrial fibrillation on Eliquis who has had worsening right more than left lower extremity weakness over 3  weeks as well as right lower extremity rash.  On 8/30 my examination is notable for stable strength testing, sensory testing that is challenging to localize (possible spinal cord level, length dependent polyneuropathy, possibly multiple overlaid processes)  At this time he is being empirically treated for VZV vasculitis, although his rash is felt to be fairly atypical for VZV.  He has had extensive serological work-up for other etiologies of vasculitis which has been negative to date.  Lymphocytic pleocytosis could be secondary to a viral infection or far more rare but more concerning a process such as lymphoma.  Paraneoplastic process is also on the differential, although work-up for malignancy has been revealing only for a lung nodule and markedly enlarged prostate.  He has also been found to be fairly osteopenic with an subacute to acute L-spine compression fracture though fortunately he has minimal tenderness to palpation.  As detailed above, discussed extensively with family as well with Dr. Nicki Guadalajara.  At minimum we will continue acyclovir for a 7-day course, consider 10 to 14-day course.  Additionally will obtain MRI pelvis to clarify potential sacral fracture. Plan for LP tomorrow for flow cytometry and paraneoplastic panel.  Impression:  Bilateral lower extremity paresis, stable Right lower extremity rash, improving Acute L1 compression  fracture, possible subtle fractures at T4/T5 Acute possible fraction of the left sacrum Hyponatremia, stable Markedly enlarged prostate with distended bladder Lung nodule  Recommendations:  # Concern for vasculitis  - Follow up on pending labs: VZV CSF send-out, OCB, protein electropheresis, CSF culture, lupus anticoagulant panel, [facility] meningitis panel,  Lyme disease DNA by PCR - Continue pregabalin 50 mg BID - We will again hold Eliquis at this time, plan for MRI pelvis today f/b LP tomorrow for flow, and paraneoplastic panel.  # Concern for occult malignancy - CT chest abdomen pelvis for malignancy screening notable for 5 mm right lower lobe pulmonary nodule, and marked prostate enlargement - Urology follow-up - Pulmonary follow-up  # Possible thoracic and sacral fractures - MRI pelvis  # Unilateral RLE edema, concern for DVT  - Lower extremity duplex to evaluate for potential DVT  Su Monks, MD [facility] [phone]  If 7pm- 7am, please page neurology on call as listed in [place].

## 2021-06-17 NOTE — Progress Notes (Signed)
PROGRESS NOTE        PATIENT DETAILS Name: Parker Odonnell. Age: 87 y.o. Sex: male Date of Birth: 1932/06/02 Admit Date: 06/10/2021 Admitting Physician Evalee Mutton Kristeen Mans, MD [id number], Dwyane Luo, MD  Brief Narrative: Patient is a 87 y.o. male persistent atrial fibrillation on Eliquis, HFrEF, HTN who presented to the hospital with right leg weakness and a purpuric right leg rash.  Given broad differential diagnosis (infectious/autoimmune/paraneoplastic/structural)-admitted to the [facility] service for further evaluation.  Work-up in progress-see below for results-ID/neurology following.  Significant events: 8/14>> presented to ED with right leg weakness/pain-fall-found to have rash-thought to have shingles-started on Valtrex and discharged  8/23>> admit to [facility] for right leg weakness/purpuric right leg rash eval/management 8/24>> punch biopsy by surgery 8/25>> LP-CSF w leukocytosis (lymphocytes) and elevated protein-Acyclovir started.  Significant radiological studies:  8/14>> CT head: No acute intracranial findings. 8/25>> MRI brain: No evidence of encephalitis/paraneoplastic disease/intracranial infection. 8/25>> MRA brain: No evidence of CNS vasculitis-no major stenosis. 8/25>> MRI thoracic/lumbar-spine: Compression fracture superior endplate of L1, acute fracture of left sacrum, mild edema within spinous process of T4/T5.  No MRI changes to suggest myelopathy. 8/27>> CT chest: 5 mm nodule in right major fissure-possibly intrapulmonary lymph node 8/27>> CT abdomen: No mass/adenopathy-Marked prostate enlargement-mild bilateral hydronephrosis.  Significant serological studies: 8/23>> TSH: Within normal limit 8/24>> RA factor: Negative 8/24>> ANA: Negative 8/24>> ds DNA antibody/anti-smooth antibody: Negative 8/24>> Sjogren's A/Sjogren's B antibody: Negative 8/24>> anti-smooth muscle antibody: Negative 8/24>> ANCA antibodies: Negative 8/24>> cardiolipin  antibodies: Negative 8/24>> Lyme antibody/PCR: Negative 8/24>> C3/C4/total complement: Normal limits 8/24>> C1 esterase inhibitor: Normal limit 8/24>> cryoglobulin: Pending 8/24>> SPEP/UPEP: No M spike noted.  No evidence for monoclonal antibodies 8/24>> Lupus anticoagulant: Not detected 8/25>>CSF Oligoclonal bands/IgG index:pending 8/27>>Vitamin B12: 998  Microbiology data: 8/23>> COVID PCR: Negative 8/23>> blood culture: No growth 8/24>> acute hepatitis panel: Negative 8/24>> HIV: Negative 8/25>> blood culture: No growth 8/25>> CSF cryptococcal antigen: Negative 8/25>> CSF culture: No growth for 3 days 8/25>> CSF VDRL: Non reactive 8/25>> CSF VZV PCR: Pending 8/25>> CSF HSV PCR: Negative 8/25>>CSF ARUP Encephalitis panel:Pending 8/26>>RPR:Non reactive  Pathology data: 8/24>> skin biopsy: Pending 8/25>> CSF cytology: negative for malignancy  Antimicrobial therapy: Acyclovir: 8/25>>  Procedures : 8/24>> punch biopsy performed by general surgery: Pending biopsy results 8/25>> lumbar puncture by neurology  Consults: Neuro, ID, general surgery  DVT Prophylaxis : Eliquis on hold-neurology contemplating repeat LP for flow cytometry.  Subjective:  Patient in bed, appears comfortable, denies any headache, no fever, no chest pain or pressure, no shortness of breath , no abdominal pain. Leg weakness mostly R-Leg    Assessment/Plan:  Bilateral lower extremity weakness, right more than left/purpuric right lower extremity rash: Etiology unclear-differentials include infectious/vasculitis/paraneoplastic-extensive work-up in process.  Imaging studies so far unrevealing.  Autoimmune work-up negative so far.  Skin biopsy results still pending.   No masses or lymphadenopathy is seen on CT chest/abdomen.  Given lymphocytic pleocytosis and elevated protein on CSF studies-empirically on acyclovir.  HSV PCR negative.  VZV PCR is still pending.  Continue acyclovir for now.  Neurology is  planning for 7 to 14-day course.  Neurology has ordered MRI pelvis. Neurology is considering repeating lumbar puncture.  Eliquis remains on hold.   Hyponatremia: Volume status is stable-although he has a history of HFrEF- wonder if he has developed SIADH physiology.  He is  on low-dose IV fluid because he is on acyclovir.   CT chest without any lung mass-although has a 5 mm lung nodule versus intrapulmonary lymph node.  Sodium level has been stable.  Persistent atrial fibrillation: Rate controlled-on Coreg-Eliquis held by neurology anticipation of repeat lumbar puncture on 06/18/21.    HFrEF: Volume status is stable-continue to hold diuretics for now.  EF 35 to 40% based on echocardiogram done in 2020.  History of CAD-s/p remote PCI: No anginal symptoms.  Hyperlipidemia: Continue statin  Essential hypertension: BP stable-continue losartan/Coreg-follow and adjust.  Large prostate with mild hydronephrosis seen on CT abdomen: Renal function stable-started on low-dose Flomax, if Bladder scan > 300 cc x 2 - Foley with Urology follow up.  L1 superior endplate compression fracture/mild edema within spinous process of T4/T5: Seen incidentally on MRI imaging.  Supportive care, no pain.  5 mm right lower lobe pulmonary nodule versus intrapulmonary lymph node: Repeat noncontrast CT chest in 12 months to reassess.   Diet: Diet Order             Diet regular Room service appropriate? Yes; Fluid consistency: Thin  Diet effective now                    Code Status:  DNR  Family Communication:  [id number] called 06/17/21  Disposition Plan: CIR is recommended by PT and OT.  Status is: Inpatient  The patient will require care spanning > 2 midnights and should be moved to inpatient because: Inpatient level of care appropriate due to severity of illness  Dispo: The patient is from: Home              Anticipated d/c is to: SNF              Patient currently is not  medically stable to d/c.   Difficult to place patient No   Barriers to Discharge: Right lower extremity weakness-rash of unknown etiology-work-up in progress  Antimicrobial agents: Anti-infectives (From admission, onward)    Start     Dose/Rate Route Frequency Ordered Stop   06/16/21 2000  acyclovir (ZOVIRAX) 660 mg in dextrose 5 % 100 mL IVPB  Status:  Discontinued        660 mg 113.2 mL/hr over 60 Minutes Intravenous Every 12 hours 06/16/21 0930 06/16/21 1011   06/16/21 2000  acyclovir (ZOVIRAX) 660 mg in dextrose 5 % 100 mL IVPB        660 mg 113.2 mL/hr over 60 Minutes Intravenous Every 12 hours 06/16/21 1021     06/12/21 1815  acyclovir (ZOVIRAX) 660 mg in dextrose 5 % 100 mL IVPB  Status:  Discontinued        10 mg/kg  66.2 kg 113.2 mL/hr over 60 Minutes Intravenous Every 12 hours 06/12/21 1718 06/16/21 0930       MEDICATIONS:  Scheduled Meds:  atorvastatin  40 mg Oral QHS   carvedilol  3.125 mg Oral BID WC   docusate sodium  100 mg Oral BID   feeding supplement  237 mL Oral BID BM   losartan  50 mg Oral Daily   multivitamin with minerals  1 tablet Oral Daily   mupirocin cream   Topical BID   pregabalin  50 mg Oral BID   tamsulosin  0.4 mg Oral Daily   Continuous Infusions:  acyclovir 660 mg (06/17/21 0856)   lactated ringers     PRN Meds:.acetaminophen **OR** [DISCONTINUED] acetaminophen, bisacodyl, hydrALAZINE, HYDROcodone-acetaminophen, lip balm, liver oil-zinc oxide,  morphine injection, [DISCONTINUED] ondansetron **OR** ondansetron (ZOFRAN) IV, polyethylene glycol, polyvinyl alcohol, traMADol   PHYSICAL EXAM: Vital signs: Vitals:   06/16/21 1956 06/16/21 2347 06/17/21 0333 06/17/21 0829  BP: 130/63 130/71 110/81 119/66  Pulse: 67 72 75 62  Resp: '20 17 17 18  '$ Temp: 97.6 F (36.4 C) 98.2 F (36.8 C) 97.8 F (36.6 C) (!) 97.4 F (36.3 C)  TempSrc: Oral Oral Axillary Oral  SpO2: 100% 98% 90% 97%  Weight:       Filed Weights   06/14/21 0407  Weight:  67 kg   Body mass index is 22.46 kg/m.   Exam:  Awake alert.  In no distress Resp: Clear to auscultation bilaterally.  Normal effort Cardio: S1-S2 is normal regular.  No S3-S4.  No rubs murmurs or bruit GI: Abdomen is soft.  Nontender nondistended.  Bowel sounds are present normal.  No masses organomegaly Extremities: No edema.   Neurologic: Alert and oriented x3.   Weakness noted bilateral lower extremities.  Right greater than left. Purpuric rash on right lower extremity >> Left.    I have personally reviewed following labs and imaging studies  LABORATORY DATA: CBC: Recent Labs  Lab 06/10/21 1310 06/11/21 0143 06/12/21 1455 06/13/21 0224 06/14/21 0111 06/15/21 0152 06/16/21 0215  WBC 5.7   < > 5.3 6.0 7.0 7.1 6.0  NEUTROABS 4.0  --  3.6  --   --   --   --   HGB 13.7   < > 12.4* 11.3* 11.1* 11.1* 10.5*  HCT 39.6   < > 36.4* 33.3* 31.5* 32.3* 30.6*  MCV 98.0   < > 98.6 99.7 96.6 99.4 99.0  PLT 170   < > 150 148* 147* 148* 137*   < > = values in this interval not displayed.    Basic Metabolic Panel: Recent Labs  Lab 06/12/21 1455 06/13/21 0224 06/14/21 0111 06/15/21 0152 06/16/21 0215  NA 130* 128* 130* 131* 131*  K 4.0 3.9 4.3 4.8 4.1  CL 103 102 102 102 102  CO2 23 22 21* 24 24  GLUCOSE 99 119* 104* 122* 115*  BUN '12 13 17 14 14  '$ CREATININE 0.92 0.82 0.83 0.98 1.02  CALCIUM 9.1 8.8* 9.1 9.3 9.1    GFR: Estimated Creatinine Clearance: 49.3 mL/min (by C-G formula based on SCr of 1.02 mg/dL).  Liver Function Tests: Recent Labs  Lab 06/12/21 1455 06/13/21 0224 06/14/21 0111 06/15/21 0152 06/16/21 0215  AST '26 23 17 16 16  '$ ALT '27 24 22 21 21  '$ ALKPHOS 111 99 113 110 105  BILITOT 1.4* 1.9* 1.3* 1.9* 1.4*  PROT 6.3* 5.5* 5.7* 5.4* 5.6*  ALBUMIN 3.2* 3.0* 2.9* 2.9* 2.9*    Cardiac Enzymes: Recent Labs  Lab 06/11/21 1406  CKTOTAL 62    Anemia Panel: Recent Labs    06/14/21 1824  VITAMINB12 998*    Urine analysis:    Component Value  Date/Time   COLORURINE YELLOW 06/17/2021 0758   APPEARANCEUR CLEAR 06/17/2021 0758   LABSPEC 1.006 06/17/2021 0758   PHURINE 7.0 06/17/2021 0758   GLUCOSEU NEGATIVE 06/17/2021 0758   HGBUR NEGATIVE 06/17/2021 0758   BILIRUBINUR NEGATIVE 06/17/2021 0758   KETONESUR NEGATIVE 06/17/2021 0758   PROTEINUR NEGATIVE 06/17/2021 0758   UROBILINOGEN 0.2 06/13/2012 1027   NITRITE NEGATIVE 06/17/2021 0758   LEUKOCYTESUR NEGATIVE 06/17/2021 0758      RADIOLOGY STUDIES/RESULTS:   MR PELVIS WO CONTRAST  Result Date: 06/16/2021 CLINICAL DATA:  Pelvic fracture EXAM: MRI  PELVIS WITHOUT CONTRAST TECHNIQUE: Multiplanar multisequence MR imaging of the pelvis was performed. No intravenous contrast was administered. COMPARISON:  CT chest abdomen pelvis, 06/14/2021 FINDINGS: Urinary Tract: Severely distended urinary bladder, measuring at least 19 cm. Bowel:  Unremarkable visualized pelvic bowel loops. Vascular/Lymphatic: No pathologically enlarged lymph nodes. No significant vascular abnormality seen. Reproductive:  Prostatomegaly with median lobe hypertrophy. Other:  None. Musculoskeletal: No suspicious bone lesions identified. Evaluation of the pelvis is significantly limited by left hip total arthroplasty and associated metallic susceptibility artifact, which particularly limits fat saturation and assessment for fracture of the left iliac bone. There is relatively focal bone marrow edema of the left aspect of the S3 segment (series 3, image 22). 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 IMPRESSION: 1. Evaluation of the pelvis is significantly limited by metallic susceptibility artifact related to left hip arthroplasty, which particularly limits fat saturation and assessment for fracture of the left iliac bone. 2. Within this limitation, there is relatively focal bone marrow edema of the left aspect of the S3 segment, consistent with insufficiency fracture.  No other evidence of pelvic fracture or traumatic bone marrow edema. 3. 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 4. Severely distended urinary bladder, measuring at least 19 cm. Correlate for urinary retention. 5. Prostatomegaly with median lobe hypertrophy. Electronically Signed   By: Eddie Candle M.D.   On: 06/16/2021 16:04     LOS: 6 days   Signature  Lala Lund M.D on 06/17/2021 at 12:10 PM   -  To page go to [email or web address]

## 2021-06-17 NOTE — Progress Notes (Signed)
Neurology Progress Note  Brief HPI: 87 y.o. 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 Previous ED evaluation on 8/14 patient was diagnosed with possible shingles for a rash on the RLE and started on valacyclovir. His weakness continued to worsen with bilateral R > L lower extremity weakness and neurology was consulted for further evaluation of weakness. On further conversation with the patient's family, it appears that weakness has actually been constant since 8/14 and has not worsened.  Subjective: Patient states his weakness feels unchanged from yesterday, though slightly improved over the past few days. No new neurologic complaints toady.  Interval data:  MR Pelvis 1. Evaluation of the pelvis is significantly limited by metallic susceptibility artifact related to left hip arthroplasty, which particularly limits fat saturation and assessment for fracture of the left iliac bone. 2. Within this limitation, there is relatively focal bone marrow edema of the left aspect of the S3 segment, consistent with insufficiency fracture. No other evidence of pelvic fracture or traumatic bone marrow edema. 3. 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 4. Severely distended urinary bladder, measuring at least 19 cm. Correlate for urinary retention. 5. Prostatomegaly with median lobe hypertrophy.  Exam: Vitals:   06/17/21 1237 06/17/21 1605  BP: 115/62 (!) 109/53  Pulse: 74 (!) 55  Resp: 17 15  Temp: (!) 97.4 F (36.3 C) 97.6 F (36.4 C)  SpO2: 96% 100%    Gen: Sitting up in bedside recliner, in no acute distress Resp: non-labored breathing, no respiratory distress on room air Abd: soft, non-tender, non-distended Extremities: The right lower  extremity is notably more edematous than the left Spine: There is no tenderness to palpation along the entirety of the spine Skin: In addition to the rash as described on the right lower extremity, there are several spots in the left upper thigh as well as just above the sacrum that appear similar.   Neuro: Mental Status: Awake, alert, and oriented to person, place, time, and situation He is able to provide some details about the history of his present illness but replies "I don't know" to many questions Speech is intact without dysarthria.  No aphasia or neglect noted in casual conversation Cranial Nerves: PERRL, EOMI without ptosis, facial sensation intact and symmetric to light touch, face is has a slight right droop at rest and with activation, hearing is intact to voice, shoulders shrug symmetrically, phonation is normal, palate rises symmetrically, tongue protrudes midline.  Motor: At rest he makes many adventitious movements of all 4 extremities that he reports is "just loosening up" he has some slight paratonia on tone testing but no clear spasticity.  5/5 in the left upper extremity, very subtle weakness 4++/5 in the right upper extremity.  When laying in bed he cannot lift the right hip antigravity, but seated on the side of the bed he is 4 -/5 in the right hip flexion, 4+/5 in the left hip flexion.  He is otherwise 4+/5 in knee flexion and extension on the right and 5/5 in knee flexion and extension on the left. Sensory: He has a length dependent loss of pinprick and cold temperature in the bilateral arms and legs, as well as a severe allodynia in the bilateral lower extremities below the knees.  He has diminished vibration sensation (absent at  the toes, 6 seconds at the knees, 9 seconds at the hands).  Additionally to sharp sensation tested along the trunk, he appears to have approximately a T8 sensory level on the back and T5 in the front  DTR: 2+ and symmetric biceps, 1+ and symmetric  patellae, stable Gait: Patient seated at the side of the bed but reported he did not feel safe ambulating with only 1 person assist and therefore this was deferred  Pertinent Labs: CBC    Component Value Date/Time   WBC 6.0 06/16/2021 0215   RBC 3.09 (L) 06/16/2021 0215   HGB 10.5 (L) 06/16/2021 0215   HGB 13.0 11/10/2019 1246   HCT 30.6 (L) 06/16/2021 0215   HCT 38.9 11/10/2019 1246   PLT 137 (L) 06/16/2021 0215   PLT 142 (L) 11/10/2019 1246   MCV 99.0 06/16/2021 0215   MCV 102 (H) 11/10/2019 1246   MCH 34.0 06/16/2021 0215   MCHC 34.3 06/16/2021 0215   RDW 13.5 06/16/2021 0215   RDW 12.1 11/10/2019 1246   LYMPHSABS 0.9 06/12/2021 1455   LYMPHSABS 1.2 12/23/2016 0804   MONOABS 0.6 06/12/2021 1455   EOSABS 0.2 06/12/2021 1455   EOSABS 0.4 12/23/2016 0804   BASOSABS 0.0 06/12/2021 1455   BASOSABS 0.0 12/23/2016 0804   CMP     Component Value Date/Time   NA 131 (L) 06/16/2021 0215   NA 139 11/10/2019 1246   K 4.1 06/16/2021 0215   CL 102 06/16/2021 0215   CO2 24 06/16/2021 0215   GLUCOSE 115 (H) 06/16/2021 0215   BUN 14 06/16/2021 0215   BUN 12 11/10/2019 1246   CREATININE 1.02 06/16/2021 0215   CREATININE 0.96 12/19/2015 0825   CALCIUM 9.1 06/16/2021 0215   PROT 5.6 (L) 06/16/2021 0215   PROT 6.5 12/23/2016 0804   ALBUMIN 2.9 (L) 06/16/2021 0215   ALBUMIN 4.0 06/12/2021 1335   AST 16 06/16/2021 0215   ALT 21 06/16/2021 0215   ALKPHOS 105 06/16/2021 0215   BILITOT 1.4 (H) 06/16/2021 0215   BILITOT 1.5 (H) 12/23/2016 0804   GFRNONAA >60 06/16/2021 0215   GFRAA >60 01/12/2020 0117   HIV 06/12/2021: Non Reactive Lyme disease serology: Negative  Results for DAKKOTA, SPICKARD (MRN [id number]) as of 06/13/2021 09:15  Ref. Range 06/12/2021 13:34 06/12/2021 13:35  Appearance, CSF Latest Ref Range: CLEAR  CLEAR (A)   Glucose, CSF Latest Ref Range: 40 - 70 mg/dL  55  RBC Count, CSF Latest Ref Range: 0 /cu mm 25 (H)   WBC, CSF Latest Ref Range: 0 - 5 /cu mm 69 (HH)    Segmented Neutrophils-CSF Latest Ref Range: 0 - 6 % 0   Lymphs, CSF Latest Ref Range: 40 - 80 % 89 (H)   Monocyte-Macrophage-Spinal Fluid Latest Ref Range: 15 - 45 % 11 (L)   Color, CSF Latest Ref Range: COLORLESS  COLORLESS   Supernatant Unknown NOT INDICATED   Total  Protein, CSF Latest Ref Range: 15 - 45 mg/dL  75 (H)  Tube # Unknown 1    Results for NAFTALI, BIELENBERG (MRN [id number]) as of 06/13/2021 09:15  Ref. Range 06/11/2021 14:06  Anti Nuclear Antibody (ANA) Latest Ref Range: Negative  Negative  ds DNA Ab Latest Ref Range: 0 - 9 IU/mL <1  RA Latex Turbid. Latest Ref Range: <14.0 IU/mL <10.0  Cytoplasmic (C-ANCA) Latest Ref Range: Neg:<1:20 titer <1:20  P-ANCA Latest Ref Range: Neg:<1:20 titer <1:20  Atypical P-ANCA titer Latest Ref Range: Neg:<1:20  titer <1:20  ENA SM Ab Ser-aCnc Latest Ref Range: 0.0 - 0.9 AI <0.2  Anti-MPO Antibodies Latest Ref Range: 0.0 - 0.9 units <0.2  Anti-PR3 Antibodies Latest Ref Range: 0.0 - 0.9 units <0.2  C1INH SerPl-mCnc Latest Ref Range: 21 - 39 mg/dL 25  C3 Complement Latest Ref Range: 82 - 167 mg/dL 107  Compl, Total (CH50) Latest Ref Range: >41 U/mL 50  Complement C4, Body Fluid Latest Ref Range: 12 - 38 mg/dL 21  Lyme Total Antibody EIA Latest Ref Range: Negative  Negative  SSA (Ro) (ENA) Antibody, IgG Latest Ref Range: 0.0 - 0.9 AI <0.2  SSB (La) (ENA) Antibody, IgG Latest Ref Range: 0.0 - 0.9 AI <0.2  Hep A Ab, IgM Latest Ref Range: NON REACTIVE  NON REACTIVE  Hepatitis B Surface Ag Latest Ref Range: NON REACTIVE  NON REACTIVE  Hep B Core Ab, IgM Latest Ref Range: NON REACTIVE  NON REACTIVE  HCV Ab Latest Ref Range: NON REACTIVE  NON REACTIVE  HIV Screen 4th Generation wRfx Latest Ref Range: Non Reactive  Non Reactive   CSF culture pending CSF gram stain: no organisms seen, WBC present, predominantly mononuclear Cryptococcal antigen CSF negative CSF cytology -- negative for malignant cells  UIFE unremarkable  Interim lab results:  RPR nonreactive, IgG index WNL, HSV 1/2 PCR neg Pending: VZV CSF send-out, OCB, protein electropheresis, CSF culture, lupus anticoagulant panel, [facility] meningitis panel,  Lyme disease DNA by PCR   Lab Results  Component Value Date   ESRSEDRATE 15 06/10/2021   Lab Results  Component Value Date   CRP 0.6 06/10/2021   Lab Results  Component Value Date   VITAMINB12 998 (H) 06/14/2021     Pertinent imaging:  MRI brain [facility] 06/13/2021: 1. No acute intracranial abnormality. Specifically, no MRI evidence for encephalitis, paraneoplastic disease, or intracranial infection. 2. 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 No associated mass effect. This is felt to be incidental in nature and of doubtful significance. 3. Otherwise normal brain MRI for age. 4. Small right greater than left mastoid effusions.  MRA head WO contrast 06/13/2021: Normal intracranial MRA. No MRI evidence for CNS vasculitis.  MRI thoracic and lumbar spine WWO contrast 06/13/2021: 1. Acute compression fracture involving the superior endplate of L1 with up to 35% height loss without significant bony retropulsion. 2. Additional probable acute fracture of the left sacrum, partially visualized. Follow-up with dedicated CT and/or MRI of the sacrum could be performed for further evaluation as warranted. 3. Mild edema within the spinous processes of T4 and T5, of uncertain etiology, but could reflect additional subtle fractures given the above findings. Correlation with physical exam for possible pain at this location recommended. 4. Normal MRI appearance of the thoracic spinal cord, conus medullaris, and cauda equina. No cord signal changes to suggest myelopathy. No abnormal enhancement. 5. Moderate lumbar spondylosis with resultant moderate spinal stenosis at L1-2 and L3-4. Additional more mild spondylosis elsewhere within the thoracolumbar spine as above.  No other significant stenosis or neural impingement. 6. Prominent distension of the urinary bladder. Clinical correlation for possible urinary retention recommended.  CT C/A/P w/ contrast Musculoskeletal: L1 superior endplate subacute compression fracture deformity with approximally the 30% loss of height anteriorly, no retropulsion. Spondylitic changes in the lower lumbar spine. Left hip arthroplasty hardware resulting in streak artifact degrading portions of the scan. Right hip DJD. IMPRESSION: 1. No mass or adenopathy. 2. Marked prostate  enlargement with distended urinary bladder, which may account for the bilateral hydronephrosis and ureterectasis in the absence of urolithiasis.  3. Descending and sigmoid diverticulosis 4. 5 mm right lower lobe pulmonary nodule. No follow-up needed if patient is low-risk. Non-contrast chest CT can be considered in 12 months if patient is high-risk. This recommendation follows the consensus statement: Guidelines for Management of Incidental Pulmonary Nodules Detected on CT Images: From the Fleischner Society 2017; Radiology 2017; 284:228-243.  Assessment: 87 year old male with history of squamous cell carcinoma of back status-post resection, atrial fibrillation on Eliquis who has had worsening right more than left lower extremity weakness over 3 weeks as well as right lower extremity rash.  On 8/30 my examination is notable for stable strength testing, sensory testing that is challenging to localize (possible spinal cord level, length dependent polyneuropathy, possibly multiple overlaid processes)  At this time he is being empirically treated for VZV vasculitis, although his rash is felt to be fairly atypical for VZV.  He has had extensive serological work-up for other etiologies of vasculitis which has been negative to date.  Lymphocytic pleocytosis could be secondary to a viral infection or far more rare but more concerning a process such as lymphoma.   Paraneoplastic process is also on the differential, although work-up for malignancy has been revealing only for a lung nodule and markedly enlarged prostate.  He has also been found to be fairly osteopenic with an subacute to acute L-spine compression fracture though fortunately he has minimal tenderness to palpation.  As detailed above, discussed extensively with family as well with Dr. Nicki Guadalajara.  At minimum we will continue acyclovir for a 7-day course, consider 10 to 14-day course.  Plan for LP tomorrow for flow cytometry and paraneoplastic panel.  Impression:  Bilateral lower extremity paresis, stable Right lower extremity rash, improving Acute L1 compression fracture, possible subtle fractures at T4/T5 Acute possible fraction of the left sacrum Hyponatremia, stable Markedly enlarged prostate with distended bladder Lung nodule  Recommendations:  # Concern for vasculitis  - Follow up on pending labs: VZV CSF send-out, OCB, protein electropheresis, CSF culture, lupus anticoagulant panel, [facility] meningitis panel,  Lyme disease DNA by PCR - Continue pregabalin 50 mg BID - We will again hold Eliquis at this time, plan for LP tomorrow 8/31 for flow cytometry and paraneoplastic panel.  # Concern for occult malignancy - CT chest abdomen pelvis for malignancy screening notable for 5 mm right lower lobe pulmonary nodule, and marked prostate enlargement - Urology follow-up - Pulmonary follow-up  # Possible thoracic and sacral fractures - MRI pelvis shows sacral fracture, defer to primary team   # Unilateral RLE edema, concern for DVT  - Lower extremity duplex to evaluate for potential DVT  Su Monks, MD [facility] [phone]  If 7pm- 7am, please page neurology on call as listed in [place].

## 2021-06-18 DIAGNOSIS — R21 Rash and other nonspecific skin eruption: Secondary | ICD-10-CM | POA: Diagnosis not present

## 2021-06-18 LAB — COMPREHENSIVE METABOLIC PANEL
ALT: 18 U/L (ref 0–44)
AST: 26 U/L (ref 15–41)
Albumin: 2.6 g/dL — ABNORMAL LOW (ref 3.5–5.0)
Alkaline Phosphatase: 100 U/L (ref 38–126)
Anion gap: 6 (ref 5–15)
BUN: 20 mg/dL (ref 8–23)
CO2: 20 mmol/L — ABNORMAL LOW (ref 22–32)
Calcium: 8.9 mg/dL (ref 8.9–10.3)
Chloride: 107 mmol/L (ref 98–111)
Creatinine, Ser: 1.33 mg/dL — ABNORMAL HIGH (ref 0.61–1.24)
GFR, Estimated: 52 mL/min — ABNORMAL LOW (ref >60)
Glucose, Bld: 103 mg/dL — ABNORMAL HIGH (ref 70–99)
Potassium: 4.6 mmol/L (ref 3.5–5.1)
Sodium: 133 mmol/L — ABNORMAL LOW (ref 135–145)
Total Bilirubin: 1.2 mg/dL (ref 0.3–1.2)
Total Protein: 4.9 g/dL — ABNORMAL LOW (ref 6.5–8.1)

## 2021-06-18 LAB — CBC WITH DIFFERENTIAL/PLATELET
Abs Immature Granulocytes: 0.03 10*3/uL (ref 0.00–0.07)
Basophils Absolute: 0 10*3/uL (ref 0.0–0.1)
Basophils Relative: 0 %
Eosinophils Absolute: 0.2 10*3/uL (ref 0.0–0.5)
Eosinophils Relative: 2 %
HCT: 29.6 % — ABNORMAL LOW (ref 39.0–52.0)
Hemoglobin: 10 g/dL — ABNORMAL LOW (ref 13.0–17.0)
Immature Granulocytes: 1 %
Lymphocytes Relative: 14 %
Lymphs Abs: 0.9 10*3/uL (ref 0.7–4.0)
MCH: 33.9 pg (ref 26.0–34.0)
MCHC: 33.8 g/dL (ref 30.0–36.0)
MCV: 100.3 fL — ABNORMAL HIGH (ref 80.0–100.0)
Monocytes Absolute: 0.5 10*3/uL (ref 0.1–1.0)
Monocytes Relative: 8 %
Neutro Abs: 4.6 10*3/uL (ref 1.7–7.7)
Neutrophils Relative %: 75 %
Platelets: 135 10*3/uL — ABNORMAL LOW (ref 150–400)
RBC: 2.95 MIL/uL — ABNORMAL LOW (ref 4.22–5.81)
RDW: 13.7 % (ref 11.5–15.5)
WBC: 6.2 10*3/uL (ref 4.0–10.5)
nRBC: 0.3 % — ABNORMAL HIGH (ref 0.0–0.2)

## 2021-06-18 LAB — OLIGOCLONAL BANDS, CSF + SERM

## 2021-06-18 LAB — IGG CSF INDEX
Albumin CSF-mCnc: 58 mg/dL — ABNORMAL HIGH (ref 15–55)
Albumin: 4 g/dL (ref 3.6–4.6)
CSF IgG Index: 0.6 (ref 0.0–0.7)
IgG (Immunoglobin G), Serum: 1183 mg/dL (ref 603–1613)
IgG, CSF: 9.8 mg/dL (ref 0.0–10.3)
IgG/Alb Ratio, CSF: 0.17 (ref 0.00–0.25)

## 2021-06-18 LAB — VDRL, CSF: VDRL Quant, CSF: NONREACTIVE

## 2021-06-18 LAB — HSV 1/2 PCR, CSF
HSV-1 DNA: NEGATIVE
HSV-2 DNA: NEGATIVE

## 2021-06-18 LAB — BRAIN NATRIURETIC PEPTIDE: B Natriuretic Peptide: 106.3 pg/mL — ABNORMAL HIGH (ref 0.0–100.0)

## 2021-06-18 LAB — MAGNESIUM: Magnesium: 1.9 mg/dL (ref 1.7–2.4)

## 2021-06-18 LAB — VZV PCR, CSF: VZV PCR, CSF: POSITIVE — AB

## 2021-06-18 MED ORDER — SODIUM CHLORIDE 0.9 % IV SOLN: INTRAVENOUS | Status: DC

## 2021-06-18 MED ORDER — LACTATED RINGERS IV SOLN: INTRAVENOUS | Status: DC

## 2021-06-18 MED ORDER — APIXABAN 2.5 MG PO TABS
2.5000 mg | ORAL_TABLET | Freq: Two times a day (BID) | ORAL | Status: DC
  Administered 2021-06-18 – 2021-06-25 (×14): 2.5 mg via ORAL
  Filled 2021-06-18 (×14): qty 1

## 2021-06-18 MED ORDER — AMLODIPINE BESYLATE 10 MG PO TABS
10.0000 mg | ORAL_TABLET | Freq: Every day | ORAL | Status: DC
  Administered 2021-06-19 – 2021-06-25 (×7): 10 mg via ORAL
  Filled 2021-06-18 (×7): qty 1

## 2021-06-18 MED ORDER — APIXABAN 5 MG PO TABS: 5.0000 mg | ORAL_TABLET | Freq: Two times a day (BID) | ORAL | Status: DC

## 2021-06-18 NOTE — Progress Notes (Addendum)
[facility] Infectious Diseases Follow Up Note  Patient Identification: Patient Name: Parker Odonnell. MRN: [id number] Admit Date: 06/10/2021 12:19 PM Age: 87 y.o.Today's Date: 06/18/2021   Reason for Visit: Bilateral lower extremity weakness/rashes   Principal Problem:   Rash Active Problems:   Dyslipidemia   Essential hypertension   Atrial flutter (HCC)   Weakness   Chronic combined systolic (congestive) and diastolic (congestive) heart failure (HCC)   DNR (do not resuscitate)   Weakness of both lower extremities   Antibiotics:  Acyclovir 8/25-current  Lines/Tubes: PIV's, external urethral catheter  Interval Events: continues to be afebrile, no leukocytosis, awake alert and oriented, rashes noted in the right lower leg more than left upper thigh and lower sacral area ( stable from exam on 06/16/21). 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  Right lower lobe pulmonary nodule( 55m) and marked prostatomegaly 3.  Left hip arthroplasty    Recommendations Continue IV Acyclovir- plan for total 14 days. Unclear benefit of steroids.  Contact and airborne isolation precautions Adequate hydration with IVF and monitor Cr Monitor for worsening of rashes and new neurological  symptoms/signs PT and OT  Will follow Meningitis/Encephalitis [facility] peripherally( labs pending as of 06/18/21)  Plan discussed with patient/ID pharmacy  Rest of the management as per the primary team. Thank you for the consult. Please page with pertinent questions or concerns.  ______________________________________________________________________ Subjective patient seen and examined at the bedside. Sitting up in the chair. He says he has not been able to walk but was able to sit on a chir OOB with assistance. He feels poor. Denies fevers, chills and sweats. Denies nausea, vomiting, abdominal pain and diarrhea.   Vitals BP (!) 151/66 (BP Location: Right Arm)   Pulse 72   Temp 98.7 F (37.1 C) (Axillary)   Resp 18   Wt 67 kg   SpO2 97%   BMI 22.46 kg/m     Physical Exam Constitutional:  Not in acute distress    Comments:   Cardiovascular:     Rate and Rhythm: Normal rate and regular rhythm.     Heart sounds:   Pulmonary:     Effort: Pulmonary effort is normal.     Comments:   Abdominal:     Palpations: Abdomen is soft.     Tenderness: Non tender, BS+  Musculoskeletal:        General: No swelling or tenderness.   Skin: No rashes in the face, back and chest, upper extremities     Comments:      Neurological:     General: Bilateral lower extremity weakness ( 4/5 in Rt LE and 5/5 in  LLE); RUE 4/5 and LUE 5/5   Psychiatric:        Mood and Affect: Mood normal. Calm and cooperative   Pertinent Microbiology Results for orders placed or performed during the hospital encounter of 06/10/21  Culture, blood (routine x 2)     Status: None   Collection Time: 06/10/21 12:55 PM   Specimen: BLOOD  Result Value Ref Range Status   Specimen Description BLOOD BLOOD RIGHT FOREARM  Final   Special Requests   Final    BOTTLES DRAWN AEROBIC AND ANAEROBIC Blood Culture adequate volume   Culture   Final    NO GROWTH 5 DAYS Performed at [facility], [place] [address],  [place], [place] [address]    Report Status 06/15/2021 FINAL  Final  Culture, blood (routine x 2)     Status: None   Collection Time: 06/10/21  1:10 PM   Specimen: BLOOD  Result Value Ref Range Status   Specimen Description BLOOD RIGHT ANTECUBITAL  Final   Special Requests   Final    BOTTLES DRAWN AEROBIC AND ANAEROBIC Blood Culture adequate volume   Culture   Final    NO GROWTH 5 DAYS Performed at [facility], [place] [address] [place], [place] [address]    Report Status 06/15/2021 FINAL  Final  SARS CORONAVIRUS 2 (TAT 6-24 HRS) Nasopharyngeal Nasopharyngeal Swab     Status: None   Collection Time: 06/10/21  7:11 PM   Specimen: Nasopharyngeal Swab  Result Value Ref Range Status   SARS Coronavirus 2 NEGATIVE NEGATIVE Final    Comment: (NOTE) SARS-CoV-2 target nucleic acids are NOT DETECTED.  The SARS-CoV-2 RNA is generally detectable in upper and lower respiratory specimens during the acute phase of infection. Negative results do not preclude SARS-CoV-2 infection, do not rule out co-infections with other pathogens, and should not be used as the sole basis for treatment or other patient management decisions. Negative results must be combined with clinical observations, patient history, and epidemiological information. The expected result is Negative.  Fact Sheet for Patients: SugarRoll.be  Fact Sheet for Healthcare Providers: https://www.woods-mathews.com/  This test is not yet approved or cleared by the [place] FDA and  has been authorized for detection and/or diagnosis of SARS-CoV-2 by FDA under an Emergency Use Authorization (EUA). This EUA will remain  in effect (meaning this test can be used) for the duration of the COVID-19 declaration under Se ction 564(b)(1) of the Act, 21 U.S.C. section 360bbb-3(b)(1), unless the authorization is terminated or revoked sooner.  Performed at [facility], [place] [address]  [place], [place] [address]   Aerobic/Anaerobic Culture w Gram Stain (surgical/deep wound)     Status: None   Collection Time: 06/11/21 11:23 AM   Specimen: Tissue  Result Value Ref Range Status   Specimen Description TISSUE  Final   Special Requests SKIN EXCISIONAL BIOPSY  Final   Gram Stain   Final    RARE WBC PRESENT,BOTH PMN AND MONONUCLEAR NO ORGANISMS SEEN    Culture   Final    RARE DIPHTHEROIDS(CORYNEBACTERIUM SPECIES) Standardized susceptibility testing for this organism is not available. Performed at [facility], [place] [address] [place], [place] [address]    Report Status 06/17/2021 FINAL  Final  VZV PCR, CSF     Status: Abnormal   Collection Time: 06/12/21  1:35 PM   Specimen: Cerebrospinal Fluid  Result Value Ref Range Status   VZV PCR, CSF Positive (A) Negative Final  Comment: (NOTE) Varicella Zoster Virus DNA detected. Performed At: [facility] [place], [place] [id number] Rush Farmer MD [id number]   CSF culture w Stat Gram Stain     Status: None   Collection Time: 06/12/21  1:36 PM   Specimen: CSF; Cerebrospinal Fluid  Result Value Ref Range Status   Specimen Description CSF  Final   Special Requests NONE  Final   Gram Stain   Final    WBC PRESENT, PREDOMINANTLY MONONUCLEAR NO ORGANISMS SEEN CYTOSPIN SMEAR    Culture   Final    NO GROWTH 3 DAYS Performed at [facility], [place] [address] [place], [place] [address]    Report Status 06/15/2021 FINAL  Final  Culture, blood (routine x 2)     Status: None   Collection Time: 06/12/21  2:47 PM   Specimen: BLOOD RIGHT ARM  Result Value Ref Range Status   Specimen Description BLOOD RIGHT ARM  Final   Special Requests   Final    BOTTLES DRAWN AEROBIC AND ANAEROBIC Blood Culture adequate volume   Culture   Final    NO GROWTH 5 DAYS Performed at [facility], [place] [address], [place], [place] [address]    Report Status 06/17/2021 FINAL  Final  Culture, blood (routine  x 2)     Status: None   Collection Time: 06/12/21  2:55 PM   Specimen: BLOOD LEFT ARM  Result Value Ref Range Status   Specimen Description BLOOD LEFT ARM  Final   Special Requests   Final    BOTTLES DRAWN AEROBIC AND ANAEROBIC Blood Culture adequate volume   Culture   Final    NO GROWTH 5 DAYS Performed at [facility], [place] [address] [place], [place] [address]    Report Status 06/17/2021 FINAL  Final    Pertinent Lab. CBC Latest Ref Rng & Units 06/18/2021 06/16/2021 06/15/2021  WBC 4.0 - 10.5 K/uL 6.2 6.0 7.1  Hemoglobin 13.0 - 17.0 g/dL 10.0(L) 10.5(L) 11.1(L)  Hematocrit 39.0 - 52.0 % 29.6(L) 30.6(L) 32.3(L)  Platelets 150 - 400 K/uL 135(L) 137(L) 148(L)    CMP Latest Ref Rng & Units 06/18/2021 06/16/2021 06/15/2021  Glucose 70 - 99 mg/dL 103(H) 115(H) 122(H)  BUN 8 - 23 mg/dL '20 14 14  '$ Creatinine 0.61 - 1.24 mg/dL 1.33(H) 1.02 0.98  Sodium 135 - 145 mmol/L 133(L) 131(L) 131(L)  Potassium 3.5 - 5.1 mmol/L 4.6 4.1 4.8  Chloride 98 - 111 mmol/L 107 102 102  CO2 22 - 32 mmol/L 20(L) 24 24  Calcium 8.9 - 10.3 mg/dL 8.9 9.1 9.3  Total Protein 6.5 - 8.1 g/dL 4.9(L) 5.6(L) 5.4(L)  Total Bilirubin 0.3 - 1.2 mg/dL 1.2 1.4(H) 1.9(H)  Alkaline Phos 38 - 126 U/L 100 105 110  AST 15 - 41 U/L '26 16 16  '$ ALT 0 - 44 U/L '18 21 21    '$ Pertinent Imaging today Plain films and CT images have been personally visualized and interpreted; radiology reports have been reviewed. Decision making incorporated into the Impression / Recommendations.  I spent more than 35 minutes for this patient encounter including review of prior medical records, coordination of care  with greater than 50% of time being face to face/counseling and discussing diagnostics/treatment plan with the patient/family.  Electronically signed by:   Rosiland Oz, MD Infectious Disease Physician [facility] Pager: [phone]

## 2021-06-18 NOTE — Progress Notes (Signed)
Occupational Therapy Treatment Patient Details Name: Parker Odonnell. MRN: [id number] DOB: 1932/01/31 Today's Date: 06/18/2021    History of present illness 87 y.o. male  presented 06/10/21 with rash, weakness. 06/13/21 MRI brain no acute anbormality; MRI spine L1 compression fx, no cord signal changes; 8/29 MRI pelvis with sacral insufficiency fracture   PMH significant of CAD; CHF; HLD; afib on Eliquis; and HTN   OT comments  Patient sitting up in recliner following PT treatment.  Patient was able to perform grooming seated in recliner with setup and vcs.  Patient returned to bed with steady due to anxious behavior and RLE pain.  Acute OT to continue to follow.  Follow Up Recommendations  CIR    Equipment Recommendations  Wheelchair (measurements OT);Wheelchair cushion (measurements OT)    Recommendations for Other Services      Precautions / Restrictions Precautions Precautions: Fall Precaution Comments: per son, 8/14 after legs began feeling weak Restrictions Weight Bearing Restrictions: No       Mobility Bed Mobility Overal bed mobility: Needs Assistance Bed Mobility: Sit to Supine     Supine to sit: Min guard Sit to supine: Min guard   General bed mobility comments: Patient anxious and vcs for instruction    Transfers Overall transfer level: Needs assistance Equipment used: Rolling walker (2 wheeled) Transfers: Sit to/from Stand Sit to Stand: Mod assist Stand pivot transfers: Max assist       General transfer comment: used steady to return to bed    Balance Overall balance assessment: Needs assistance Sitting-balance support: Feet supported Sitting balance-Leahy Scale: Fair     Standing balance support: Bilateral upper extremity supported;During functional activity Standing balance-Leahy Scale: Zero Standing balance comment: Steady used for transfer and standing                           ADL either performed or assessed with clinical  judgement   ADL Overall ADL's : Needs assistance/impaired     Grooming: Wash/dry hands;Wash/dry face;Oral care;Brushing hair;Sitting;Set up Grooming Details (indicate cue type and reason): setup while seated in recliner                                     Vision       Perception     Praxis      Cognition Arousal/Alertness: Awake/alert Behavior During Therapy: Anxious Overall Cognitive Status: Within Functional Limits for tasks assessed                                 General Comments: Anxious due to pain at RLE and fear of falling during transfer        Exercises Exercises: General Lower Extremity General Exercises - Lower Extremity Ankle Circles/Pumps: AROM;Both;10 reps Long Arc Quad: AROM;Right;5 reps Heel Slides: AAROM;Right;AROM;Left;5 reps;Supine Hip ABduction/ADduction: AAROM;Right;AROM;Left;10 reps;Supine Straight Leg Raises: AAROM;Right;AROM;Left;5 reps   Shoulder Instructions       General Comments      Pertinent Vitals/ Pain       Pain Assessment: 0-10 Pain Score: 6  Faces Pain Scale: Hurts even more Pain Location: R leg Pain Descriptors / Indicators: Discomfort;Grimacing;Moaning Pain Intervention(s): Limited activity within patient's tolerance;Repositioned  Home Living  Prior Functioning/Environment              Frequency  Min 2X/week        Progress Toward Goals  OT Goals(current goals can now be found in the care plan section)  Progress towards OT goals: Progressing toward goals  Acute Rehab OT Goals Patient Stated Goal: Need to move better OT Goal Formulation: With patient Time For Goal Achievement: 06/25/21 Potential to Achieve Goals: Fair ADL Goals Pt Will Perform Grooming: sitting;standing;with min guard assist Pt Will Perform Lower Body Bathing: with min assist;sit to/from stand Pt Will Perform Lower Body Dressing: with min assist;sit  to/from stand Pt Will Transfer to Toilet: with min assist;ambulating;regular height toilet  Plan Discharge plan remains appropriate    Co-evaluation                 AM-PAC OT "6 Clicks" Daily Activity     Outcome Measure   Help from another person eating meals?: None Help from another person taking care of personal grooming?: A Little Help from another person toileting, which includes using toliet, bedpan, or urinal?: A Lot Help from another person bathing (including washing, rinsing, drying)?: A Lot Help from another person to put on and taking off regular upper body clothing?: A Little Help from another person to put on and taking off regular lower body clothing?: A Lot 6 Click Score: 16    End of Session Equipment Utilized During Treatment: Gait belt  OT Visit Diagnosis: Unsteadiness on feet (R26.81);Other abnormalities of gait and mobility (R26.89);Muscle weakness (generalized) (M62.81);Pain Pain - Right/Left: Right Pain - part of body: Ankle and joints of foot   Activity Tolerance Patient limited by fatigue;Patient limited by pain   Patient Left in bed;with call bell/phone within reach;with bed alarm set   Nurse Communication Need for lift equipment        Time: [id number] OT Time Calculation (min): 33 min  Charges: OT General Charges $OT Visit: 1 Visit OT Treatments $Self Care/Home Management : 8-22 mins $Therapeutic Activity: 8-22 mins  Lodema Hong, [place] 06/18/2021, 10:54 AM

## 2021-06-18 NOTE — Progress Notes (Signed)
Neurology Progress Note  Brief HPI: 87 y.o. 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 Previous ED evaluation on 8/14 patient was diagnosed with possible shingles for a rash on the RLE and started on valacyclovir. His weakness continued to worsen with bilateral R > L lower extremity weakness and neurology was consulted for further evaluation of weakness. On further conversation with the patient's family, it appears that weakness has actually been constant since 8/14 and has not worsened.  Subjective: No acute overnight events  VZV PCR CSF resulted positive from lab today  Exam: Vitals:   06/18/21 0329 06/18/21 0757  BP: 135/74 (!) 151/66  Pulse: 71 72  Resp: 17 18  Temp: 98 F (36.7 C) 98.7 F (37.1 C)  SpO2:  97%   Gen: In bed eating breakfast, in no acute distress Resp: non-labored breathing, no respiratory distress, SpO2 97% on room air  Abd: soft, non-tender, non-distended  Neuro: Mental Status: Awake, alert, oriented to self, month, and somewhat to location.  When examiner states good morning patient states "good afternoon" and is confused about the time of day. He incorrectly states that he is 87 years old and that the year is 76 before quickly correcting himself and stating the year is 2022. He states that he is in a hospital but states that he is in [facility] for treatment of his legs. Speech is intact without dysarthria and there is no evidence of aphasia noted.  Cranial Nerves: PERRL, EOMI without ptosis, facial sensation intact and symmetric to light touch, face is has a slight right droop at rest and with activation, hearing is intact to voice, shoulders shrug symmetrically, phonation is normal, palate rises symmetrically, tongue protrudes  midline.  Motor: 5/5 in the left upper extremity, very subtle weakness 4+/5 in the right upper extremity. RLE is weaker than left lower extremity with wincing during RLE elevation. 4-/5 in right hip, 4+/5 at the right knee. LLE with 4+/5 strength at the hip and 5/5 strength at the knee.  Sensory:  He has a length dependent loss of pinprick and cold temperature in the bilateral arms and legs, as well as a severe allodynia in the bilateral lower extremities below the knees, especially at the feet.  He has diminished vibration sensation (absent at the toes, 6 seconds at the knees, 9 seconds at the hands).  DTR: 2+ and symmetric biceps, 1+ and symmetric patellae, stable Gait: Deferred  Pertinent Labs: CBC    Component Value Date/Time   WBC 6.2 06/18/2021 0140   RBC 2.95 (L) 06/18/2021 0140   HGB 10.0 (L) 06/18/2021 0140   HGB 13.0 11/10/2019 1246   HCT 29.6 (L) 06/18/2021 0140   HCT 38.9 11/10/2019 1246   PLT 135 (L) 06/18/2021 0140   PLT 142 (L) 11/10/2019 1246   MCV 100.3 (H) 06/18/2021 0140   MCV 102 (H) 11/10/2019 1246   MCH 33.9 06/18/2021 0140   MCHC 33.8 06/18/2021 0140   RDW 13.7 06/18/2021 0140   RDW 12.1 11/10/2019 1246   LYMPHSABS 0.9 06/18/2021 0140   LYMPHSABS 1.2 12/23/2016 0804   MONOABS 0.5 06/18/2021 0140   EOSABS 0.2 06/18/2021 0140   EOSABS 0.4 12/23/2016 0804   BASOSABS 0.0 06/18/2021 0140   BASOSABS 0.0 12/23/2016 0804   CMP  Component Value Date/Time   NA 133 (L) 06/18/2021 0140   NA 139 11/10/2019 1246   K 4.6 06/18/2021 0140   CL 107 06/18/2021 0140   CO2 20 (L) 06/18/2021 0140   GLUCOSE 103 (H) 06/18/2021 0140   BUN 20 06/18/2021 0140   BUN 12 11/10/2019 1246   CREATININE 1.33 (H) 06/18/2021 0140   CREATININE 0.96 12/19/2015 0825   CALCIUM 8.9 06/18/2021 0140   PROT 4.9 (L) 06/18/2021 0140   PROT 6.5 12/23/2016 0804   ALBUMIN 2.6 (L) 06/18/2021 0140   ALBUMIN 4.0 06/12/2021 1335   AST 26 06/18/2021 0140   ALT 18 06/18/2021 0140   ALKPHOS  100 06/18/2021 0140   BILITOT 1.2 06/18/2021 0140   BILITOT 1.5 (H) 12/23/2016 0804   GFRNONAA 52 (L) 06/18/2021 0140   GFRAA >60 01/12/2020 0117   Lipid Panel     Component Value Date/Time   CHOL 104 12/23/2016 0804   CHOL 110 01/04/2014 0822   TRIG 62 12/23/2016 0804   TRIG 72 01/04/2014 0822   HDL 47 12/23/2016 0804   HDL 40 01/04/2014 0822   CHOLHDL 2.2 12/23/2016 0804   CHOLHDL 2.4 12/19/2015 0825   VLDL 11 12/19/2015 0825   LDLCALC 45 12/23/2016 0804   LDLCALC 56 01/04/2014 0822   LABVLDL 12 12/23/2016 0804    Ref. Range 06/12/2021 13:34 06/12/2021 13:34 06/12/2021 13:35  Albumin CSF-mCnc Latest Ref Range: 15 - 55 mg/dL   58 (H)  Appearance, CSF Latest Ref Range: CLEAR  CLEAR (A) CLEAR (A)   Glucose, CSF Latest Ref Range: 40 - 70 mg/dL   55  RBC Count, CSF Latest Ref Range: 0 /cu mm 25 (H) 1 (H)   WBC, CSF Latest Ref Range: 0 - 5 /cu mm 69 (HH) 66 (HH)   Segmented Neutrophils-CSF Latest Ref Range: 0 - 6 % 0 0   Lymphs, CSF Latest Ref Range: 40 - 80 % 89 (H) 83 (H)   Monocyte-Macrophage-Spinal Fluid Latest Ref Range: 15 - 45 % 11 (L) 17   Color, CSF Latest Ref Range: COLORLESS  COLORLESS COLORLESS   Supernatant Unknown NOT INDICATED NOT INDICATED   IgG, CSF Latest Ref Range: 0.0 - 10.3 mg/dL   9.8  IgG/Alb Ratio, CSF Latest Ref Range: 0.00 - 0.25    0.17  CSF IgG Index Latest Ref Range: 0.0 - 0.7    0.6  Total  Protein, CSF Latest Ref Range: 15 - 45 mg/dL   75 (H)  Tube # Unknown 1 tube 4      Ref. Range 06/11/2021 14:06  Anti Nuclear Antibody (ANA) Latest Ref Range: Negative  Negative  ds DNA Ab Latest Ref Range: 0 - 9 IU/mL <1  RA Latex Turbid. Latest Ref Range: <14.0 IU/mL <10.0  Cytoplasmic (C-ANCA) Latest Ref Range: Neg:<1:20 titer <1:20  P-ANCA Latest Ref Range: Neg:<1:20 titer <1:20  Atypical P-ANCA titer Latest Ref Range: Neg:<1:20 titer <1:20  ENA SM Ab Ser-aCnc Latest Ref Range: 0.0 - 0.9 AI <0.2  Anti-MPO Antibodies Latest Ref Range: 0.0 - 0.9 units <0.2   Anti-PR3 Antibodies Latest Ref Range: 0.0 - 0.9 units <0.2  C1INH SerPl-mCnc Latest Ref Range: 21 - 39 mg/dL 25  C3 Complement Latest Ref Range: 82 - 167 mg/dL 107  Compl, Total (CH50) Latest Ref Range: >41 U/mL 50  Complement C4, Body Fluid Latest Ref Range: 12 - 38 mg/dL 21  Lyme Total Antibody EIA Latest Ref Range: Negative  Negative  SSA (Ro) (ENA) Antibody,  IgG Latest Ref Range: 0.0 - 0.9 AI <0.2  SSB (La) (ENA) Antibody, IgG Latest Ref Range: 0.0 - 0.9 AI <0.2  Hep A Ab, IgM Latest Ref Range: NON REACTIVE  NON REACTIVE  Hepatitis B Surface Ag Latest Ref Range: NON REACTIVE  NON REACTIVE  Hep B Core Ab, IgM Latest Ref Range: NON REACTIVE  NON REACTIVE  HCV Ab Latest Ref Range: NON REACTIVE  NON REACTIVE  HIV Screen 4th Generation wRfx Latest Ref Range: Non Reactive  Non Reactive   VZV PCR, CSF 06/12/2021 positive Cytology CSF: FINAL MICROSCOPIC DIAGNOSIS:  - No malignant cells identified CSF culture pending CSF gram stain: no organisms seen, WBC present, predominantly mononuclear Cryptococcal antigen CSF negative CSF cytology -- negative for malignant cells Protein electrophoresis, serum- unremarkable without evidence of monoclonal protein.  CSF culture no growth at 3 days Lyme disease DNA by PCR- negative  PTT Lupus Anticoagulant 0.0 - 51.9 sec 37.0   DRVVT 0.0 - 47.0 sec 49.2 High      Interim lab results: RPR nonreactive, IgG index WNL, HSV 1/2 PCR neg Pending: VZV CSF send-out, O[facility], protein electropheresis, CSF culture, lupus anticoagulant panel, [facility] meningitis panel,  Lyme disease DNA by PCR  Gram Stain WBC PRESENT, PREDOMINANTLY MONONUCLEAR  NO ORGANISMS SEEN  CYTOSPIN SMEAR   Culture NO GROWTH 3 DAYS    Lab Results  Component Value Date   TSH 1.875 06/10/2021   Lab Results  Component Value Date   [id number] (H) 06/14/2021   Unresulted Labs (From admission, onward)     Start     Ordered   06/18/21 0500  CBC with Differential/Platelet  Daily,   R      Question:  Specimen collection method  Answer:  Lab=Lab collect   06/17/21 0757   06/18/21 0500  Brain natriuretic peptide  Daily,   R     Question:  Specimen collection method  Answer:  Lab=Lab collect   06/17/21 0757   06/18/21 0500  Magnesium  Daily,   R     Question:  Specimen collection method  Answer:  Lab=Lab collect   06/17/21 0757   06/18/21 0500  Comprehensive metabolic panel  Daily,   R     Question:  Specimen collection method  Answer:  Lab=Lab collect   06/17/21 0757   06/13/21 1035  Miscellaneous [facility] test (send-out)  Add-on,   AD       Comments: [facility] labs: [email or web address]   Question:  Test name / description:  Answer:  Varicella-Zoster Virus Antibody, IgG, CSF   06/13/21 1035   06/12/21 1611  Miscellaneous [facility] test (send-out)  Add-on,   AD       Comments: [facility] test [id number]- Please send out CSF from 8/25 for meningitis panel at [facility]   Question:  Test name / description:  Answer:  [facility] meningitis panel   06/12/21 1611   06/12/21 1337  Draw extra clot tube  (Oligoclonal Bands, CSF + Serum panel)  Once,   R        06/12/21 1336           Imaging Reviewed:  MRI brain [facility] 06/13/2021: 1. No acute intracranial abnormality. Specifically, no MRI evidence for encephalitis, paraneoplastic disease, or intracranial infection. 2. 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 No associated mass effect. This is felt to be incidental in nature and of doubtful significance. 3. Otherwise normal brain MRI for  age. 62. Small right greater than left mastoid effusions.   MRA head WO contrast 06/13/2021: Normal intracranial MRA. No MRI evidence for CNS vasculitis.   MRI thoracic and lumbar spine WWO contrast 06/13/2021: 1. Acute compression fracture involving the superior endplate of L1 with up to 35% height loss without significant bony retropulsion. 2. Additional probable acute  fracture of the left sacrum, partially visualized. Follow-up with dedicated CT and/or MRI of the sacrum could be performed for further evaluation as warranted. 3. Mild edema within the spinous processes of T4 and T5, of uncertain etiology, but could reflect additional subtle fractures given the above findings. Correlation with physical exam for possible pain at this location recommended. 4. Normal MRI appearance of the thoracic spinal cord, conus medullaris, and cauda equina. No cord signal changes to suggest myelopathy. No abnormal enhancement. 5. Moderate lumbar spondylosis with resultant moderate spinal stenosis at L1-2 and L3-4. Additional more mild spondylosis elsewhere within the thoracolumbar spine as above. No other significant stenosis or neural impingement. 6. Prominent distension of the urinary bladder. Clinical correlation for possible urinary retention recommended.   CT C/A/P w/ contrast Musculoskeletal: L1 superior endplate subacute compression fracture deformity with approximally the 30% loss of height anteriorly, no retropulsion. Spondylitic changes in the lower lumbar spine. Left hip arthroplasty hardware resulting in streak artifact degrading portions of the scan. Right hip DJD. IMPRESSION: 1. No mass or adenopathy. 2. Marked prostate enlargement with distended urinary bladder, which may account for the bilateral hydronephrosis and ureterectasis in the absence of urolithiasis.  3. Descending and sigmoid diverticulosis 4. 5 mm right lower lobe pulmonary nodule. No follow-up needed if patient is low-risk. Non-contrast chest CT can be considered in 12 months if patient is high-risk. This recommendation follows the consensus statement: Guidelines for Management of Incidental Pulmonary Nodules Detected on CT Images: From the Fleischner Society 2017; Radiology 2017; 284:228-243.  Assessment:  87 year old male with history of squamous cell carcinoma of back status-post resection, AF on  Eliquis who has had worsening R > L lower extremity weakness over 3 weeks as well as right lower extremity rash.   On 8/30, his examination is notable for stable strength testing, sensory testing that is challenging to localize (possible spinal cord level, length dependent polyneuropathy, possibly multiple overlaid processes).    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  He has had extensive serological work-up for other etiologies of vasculitis which has been negative to date.  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 There is low suspicion for paraneoplastic process or lymphoma in the setting of VZV positive CSF PCR and with malignancy work-up that is largely unrevealing with only a lung nodule and markedly enlarged prostate. Will expand acyclovir coverage to a 14 day course with VZV PCR CSF studies.   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  -  Okay to resume Eliquis  - Expand acyclovir coverage for a total of 14 days; ID is in agreement.   # Concern for occult malignancy; less likely - CT chest abdomen pelvis for malignancy screening notable for 5 mm right lower lobe pulmonary nodule, and marked prostate enlargement - Urology follow-up - Pulmonary follow-up   # Possible thoracic and sacral  fractures - MRI pelvis shows sacral fracture, defer to primary team   # Unilateral RLE edema, concern for DVT  - Doppler imaging without evidence of DVT  Agree with plan for 14 day course of acyclovir, will defer to ID on any further recommendations on antiviral regimen. No further neurology recommendations at this time. Neurology will be available on an as needed basis moving forward.   Anibal Henderson, AGACNP-BC [facility] [phone]  Neurology Attending Attestation   I examined the patient and discussed plan with Ms. Toberman NP. Above note has been edited by me to reflect my findings and recommendations.    Su Monks, MD [facility] [phone]   If 7pm- 7am, please page neurology on call as listed in [place].

## 2021-06-18 NOTE — Progress Notes (Signed)
Consult placed for PIV placement. Upon assessment of this patient. It has been noted that he has pulled out 5 PIV's in 8 days, with 1 infiltrated. Discussed POC with nurse. Notified that frequent observation of patient is needed to monitor IV site. Another PIV was placed, and wrapped with gauze and provided education to patient. VU. Fran Lowes, RN VAST

## 2021-06-18 NOTE — Progress Notes (Addendum)
PROGRESS NOTE        PATIENT DETAILS Name: Parker Odonnell. Age: 87 y.o. Sex: male Date of Birth: 27-Oct-1931 Admit Date: 06/10/2021 Admitting Physician Evalee Mutton Kristeen Mans, MD [id number], Dwyane Luo, MD  Brief Narrative: Patient is a 87 y.o. male persistent atrial fibrillation on Eliquis, HFrEF, HTN who presented to the hospital with right leg weakness and a purpuric right leg rash.  Given broad differential diagnosis (infectious/autoimmune/paraneoplastic/structural)-admitted to the [facility] service for further evaluation.  Work-up in progress-see below for results-ID/neurology following.  Significant events: 8/14>> presented to ED with right leg weakness/pain-fall-found to have rash-thought to have shingles-started on Valtrex and discharged  8/23>> admit to [facility] for right leg weakness/purpuric right leg rash eval/management 8/24>> punch biopsy by surgery 8/25>> LP-CSF w leukocytosis (lymphocytes) and elevated protein-Acyclovir started.  Significant radiological studies:  8/14>> CT head: No acute intracranial findings. 8/25>> MRI brain: No evidence of encephalitis/paraneoplastic disease/intracranial infection. 8/25>> MRA brain: No evidence of CNS vasculitis-no major stenosis. 8/25>> MRI thoracic/lumbar-spine: Compression fracture superior endplate of L1, acute fracture of left sacrum, mild edema within spinous process of T4/T5.  No MRI changes to suggest myelopathy. 8/27>> CT chest: 5 mm nodule in right major fissure-possibly intrapulmonary lymph node 8/27>> CT abdomen: No mass/adenopathy-Marked prostate enlargement-mild bilateral hydronephrosis.  Significant serological studies: 8/23>> TSH: Within normal limit 8/24>> RA factor: Negative 8/24>> ANA: Negative 8/24>> ds DNA antibody/anti-smooth antibody: Negative 8/24>> Sjogren's A/Sjogren's B antibody: Negative 8/24>> anti-smooth muscle antibody: Negative 8/24>> ANCA antibodies: Negative 8/24>> cardiolipin  antibodies: Negative 8/24>> Lyme antibody/PCR: Negative 8/24>> C3/C4/total complement: Normal limits 8/24>> C1 esterase inhibitor: Normal limit 8/24>> cryoglobulin: Pending 8/24>> SPEP/UPEP: No M spike noted.  No evidence for monoclonal antibodies 8/24>> Lupus anticoagulant: Not detected 8/25>>CSF Oligoclonal bands/IgG index:pending 8/27>>Vitamin B12: 998  Microbiology data: 8/23>> COVID PCR: Negative 8/23>> blood culture: No growth 8/24>> acute hepatitis panel: Negative 8/24>> HIV: Negative 8/25>> blood culture: No growth 8/25>> CSF cryptococcal antigen: Negative 8/25>> CSF culture: No growth for 3 days 8/25>> CSF VDRL: Non reactive 8/25>> CSF VZV PCR: Pending 8/25>> CSF HSV PCR: Negative 8/25>>CSF ARUP Encephalitis panel:Pending 8/26>>RPR:Non reactive  Pathology data: 8/24>> skin biopsy: Pending 8/25>> CSF cytology: negative for malignancy  Antimicrobial therapy: Acyclovir: 8/25>>  Procedures : 8/24>> punch biopsy performed by general surgery:   8/25>> lumbar puncture by neurology  Consults: Neuro, ID, general surgery  DVT Prophylaxis : Eliquis   Subjective:  Patient in bed, appears comfortable, denies any headache, no fever, no chest pain or pressure, no shortness of breath , no abdominal pain. No new focal weakness.   Assessment/Plan:  Bilateral lower extremity weakness, right more than left/purpuric right lower extremity rash: Etiology unclear-differentials include infectious/vasculitis/paraneoplastic-extensive work-up in process.  Imaging studies so far unrevealing.  Autoimmune work-up negative so far.  Skin biopsy results still pending. No masses or lymphadenopathy is seen on CT chest/abdomen.  Given lymphocytic pleocytosis and elevated protein on CSF, HSV PCR negative.  VZV PCR is now +ve.  Continue acyclovir for now.  Neurology is planning for 7 to 14-day course. DW  Neuro 06/18/21 - Skin Biopsy no vasculitis - likely Viral rash.    Hyponatremia: Volume  status is stable-although he has a history of HFrEF- wonder if he has developed SIADH physiology.  He is on low-dose IV fluid because he is on acyclovir. CT chest without any lung mass-although has a 5  mm lung nodule versus intrapulmonary lymph node. Sodium level has been stable.  Persistent atrial fibrillation: Rate controlled-on Coreg-Eliquis.  HFrEF: Volume status is stable-continue to hold diuretics for now.  EF 35 to 40% based on echocardiogram done in 2020.  History of CAD-s/p remote PCI: No anginal symptoms.  Hyperlipidemia: Continue statin  Essential hypertension: BP stable-stopped ARB due to AKI, Coreg and Norvasc combination.  Large prostate with mild hydronephrosis seen on CT abdomen: Renal function stable-started on low-dose Flomax, if Bladder scan > 300 cc x 2 - Foley with Urology follow up.  L1 superior endplate compression fracture/mild edema within spinous process of T4/T5, Sacral insufficiency fracture : Seen incidentally on MRI imaging.  Supportive care, no pain.  5 mm right lower lobe pulmonary nodule versus intrapulmonary lymph node: Repeat noncontrast CT chest in 12 months to reassess.  Mild AKI - IVF, as on Acylovir, continue Foley. DC ARB.    Diet: Diet Order             Diet regular Room service appropriate? Yes; Fluid consistency: Thin  Diet effective now                    Code Status:  DNR  Family Communication:  [id number] called 06/17/21, 06/18/21 - detailed updates.  Disposition Plan: CIR is recommended by PT and OT.  Status is: Inpatient  The patient will require care spanning > 2 midnights and should be moved to inpatient because: Inpatient level of care appropriate due to severity of illness  Dispo: The patient is from: Home              Anticipated d/c is to: SNF              Patient currently is not medically stable to d/c.   Difficult to place patient No   Barriers to Discharge: Right lower extremity weakness-rash  of unknown etiology-work-up in progress  Antimicrobial agents: Anti-infectives (From admission, onward)    Start     Dose/Rate Route Frequency Ordered Stop   06/16/21 2000  acyclovir (ZOVIRAX) 660 mg in dextrose 5 % 100 mL IVPB  Status:  Discontinued        660 mg 113.2 mL/hr over 60 Minutes Intravenous Every 12 hours 06/16/21 0930 06/16/21 1011   06/16/21 2000  acyclovir (ZOVIRAX) 660 mg in dextrose 5 % 100 mL IVPB        660 mg 113.2 mL/hr over 60 Minutes Intravenous Every 12 hours 06/16/21 1021     06/12/21 1815  acyclovir (ZOVIRAX) 660 mg in dextrose 5 % 100 mL IVPB  Status:  Discontinued        10 mg/kg  66.2 kg 113.2 mL/hr over 60 Minutes Intravenous Every 12 hours 06/12/21 1718 06/16/21 0930       MEDICATIONS:  Scheduled Meds:  atorvastatin  40 mg Oral QHS   carvedilol  3.125 mg Oral BID WC   Chlorhexidine Gluconate Cloth  6 each Topical Daily   docusate sodium  100 mg Oral BID   feeding supplement  237 mL Oral BID BM   losartan  50 mg Oral Daily   multivitamin with minerals  1 tablet Oral Daily   mupirocin cream   Topical BID   pregabalin  50 mg Oral BID   tamsulosin  0.4 mg Oral Daily   Continuous Infusions:  sodium chloride 50 mL/hr at 06/18/21 1057   acyclovir 660 mg (06/18/21 0849)   PRN Meds:.acetaminophen **OR** [DISCONTINUED] acetaminophen, bisacodyl, hydrALAZINE,  HYDROcodone-acetaminophen, lip balm, liver oil-zinc oxide, morphine injection, [DISCONTINUED] ondansetron **OR** ondansetron (ZOFRAN) IV, polyethylene glycol, polyvinyl alcohol, traMADol   PHYSICAL EXAM: Vital signs: Vitals:   06/18/21 0006 06/18/21 0329 06/18/21 0757 06/18/21 1123  BP: 116/65 135/74 (!) 151/66 135/77  Pulse: 69 71 72 73  Resp: '13 17 18 16  '$ Temp: 98 F (36.7 C) 98 F (36.7 C) 98.7 F (37.1 C) 98.7 F (37.1 C)  TempSrc: Oral Oral Axillary Oral  SpO2: 97%  97% 92%  Weight:       Filed Weights   06/14/21 0407  Weight: 67 kg   Body mass index is 22.46 kg/m.    Exam:  Awake Alert,   [place].AT,PERRAL Supple Neck,No JVD, No cervical lymphadenopathy appriciated.  Symmetrical Chest wall movement, Good air movement bilaterally, CTAB RRR,No Gallops, Rubs or new Murmurs, No Parasternal Heave +ve B.Sounds, Abd Soft, No tenderness, No organomegaly appriciated, No rebound - guarding or rigidity. Weakness noted bilateral lower extremities.  Right greater than left. Purpuric rash on right lower extremity >> Left.    I have personally reviewed following labs and imaging studies  LABORATORY DATA: CBC: Recent Labs  Lab 06/12/21 1455 06/13/21 0224 06/14/21 0111 06/15/21 0152 06/16/21 0215 06/18/21 0140  WBC 5.3 6.0 7.0 7.1 6.0 6.2  NEUTROABS 3.6  --   --   --   --  4.6  HGB 12.4* 11.3* 11.1* 11.1* 10.5* 10.0*  HCT 36.4* 33.3* 31.5* 32.3* 30.6* 29.6*  MCV 98.6 99.7 96.6 99.4 99.0 100.3*  PLT 150 148* 147* 148* 137* 135*    Basic Metabolic Panel: Recent Labs  Lab 06/13/21 0224 06/14/21 0111 06/15/21 0152 06/16/21 0215 06/18/21 0140  NA 128* 130* 131* 131* 133*  K 3.9 4.3 4.8 4.1 4.6  CL 102 102 102 102 107  CO2 22 21* 24 24 20*  GLUCOSE 119* 104* 122* 115* 103*  BUN '13 17 14 14 20  '$ CREATININE 0.82 0.83 0.98 1.02 1.33*  CALCIUM 8.8* 9.1 9.3 9.1 8.9  MG  --   --   --   --  1.9    GFR: Estimated Creatinine Clearance: 37.8 mL/min (A) (by C-G formula based on SCr of 1.33 mg/dL (H)).  Liver Function Tests: Recent Labs  Lab 06/13/21 0224 06/14/21 0111 06/15/21 0152 06/16/21 0215 06/18/21 0140  AST '23 17 16 16 26  '$ ALT '24 22 21 21 18  '$ ALKPHOS 99 113 110 105 100  BILITOT 1.9* 1.3* 1.9* 1.4* 1.2  PROT 5.5* 5.7* 5.4* 5.6* 4.9*  ALBUMIN 3.0* 2.9* 2.9* 2.9* 2.6*    Cardiac Enzymes: Recent Labs  Lab 06/11/21 1406  CKTOTAL 62    Anemia Panel: No results for input(s): VITAMINB12, FOLATE, FERRITIN, TIBC, IRON, RETICCTPCT in the last 72 hours.   Urine analysis:    Component Value Date/Time   COLORURINE YELLOW 06/17/2021 0758    APPEARANCEUR CLEAR 06/17/2021 0758   LABSPEC 1.006 06/17/2021 0758   PHURINE 7.0 06/17/2021 0758   GLUCOSEU NEGATIVE 06/17/2021 0758   HGBUR NEGATIVE 06/17/2021 0758   BILIRUBINUR NEGATIVE 06/17/2021 0758   KETONESUR NEGATIVE 06/17/2021 0758   PROTEINUR NEGATIVE 06/17/2021 0758   UROBILINOGEN 0.2 06/13/2012 1027   NITRITE NEGATIVE 06/17/2021 0758   LEUKOCYTESUR NEGATIVE 06/17/2021 0758      RADIOLOGY STUDIES/RESULTS:   MR PELVIS WO CONTRAST  Result Date: 06/16/2021 CLINICAL DATA:  Pelvic fracture EXAM: MRI PELVIS WITHOUT CONTRAST TECHNIQUE: Multiplanar multisequence MR imaging of the pelvis was performed. No intravenous contrast was administered. COMPARISON:  CT chest abdomen pelvis, 06/14/2021 FINDINGS: Urinary Tract: Severely distended urinary bladder, measuring at least 19 cm. Bowel:  Unremarkable visualized pelvic bowel loops. Vascular/Lymphatic: No pathologically enlarged lymph nodes. No significant vascular abnormality seen. Reproductive:  Prostatomegaly with median lobe hypertrophy. Other:  None. Musculoskeletal: No suspicious bone lesions identified. Evaluation of the pelvis is significantly limited by left hip total arthroplasty and associated metallic susceptibility artifact, which particularly limits fat saturation and assessment for fracture of the left iliac bone. There is relatively focal bone marrow edema of the left aspect of the S3 segment (series 3, image 22). 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 IMPRESSION: 1. Evaluation of the pelvis is significantly limited by metallic susceptibility artifact related to left hip arthroplasty, which particularly limits fat saturation and assessment for fracture of the left iliac bone. 2. Within this limitation, there is relatively focal bone marrow edema of the left aspect of the S3 segment, consistent with insufficiency fracture. No other evidence of pelvic fracture or  traumatic bone marrow edema. 3. 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 4. Severely distended urinary bladder, measuring at least 19 cm. Correlate for urinary retention. 5. Prostatomegaly with median lobe hypertrophy. Electronically Signed   By: Eddie Candle M.D.   On: 06/16/2021 16:04     LOS: 7 days   Signature  Lala Lund M.D on 06/18/2021 at 11:58 AM   -  To page go to [email or web address]

## 2021-06-18 NOTE — Progress Notes (Signed)
Nutrition Follow-up  DOCUMENTATION CODES:   Not applicable  INTERVENTION:   -Continue Ensure Enlive po BID, each supplement provides 350 kcal and 20 grams of protein  -Continue MVI with minerals daily  NUTRITION DIAGNOSIS:   Increased nutrient needs related to acute illness as evidenced by estimated needs.  Ongoing  GOAL:   Patient will meet greater than or equal to 90% of their needs  Progressing   MONITOR:   PO intake, Supplement acceptance, Labs, Weight trends, Skin, I & O's  REASON FOR ASSESSMENT:   Consult Assessment of nutrition requirement/status  ASSESSMENT:   Parker Odonnell. is a 87 y.o. male with medical history significant of CAD; CHF; HLD; afib on Eliquis; and HTN presenting with rash, weakness.  On 8/14, he was seen at [facility] for leg weakness, acute onset.  8/25- s/p lumbar puncture  Reviewed I/O's: -3.2 L x 24 hours and -6.4 L since admission  UOP: 3.99 L x 24 hours  Per MD notes, neurology considering repeat lumbar puncture. Work-up for bilateral lower extremity weakness is ongoing.   Pt receiving nursing care at time of visit.   Pt with good appetite. Noted meal completion 30-100%. Pt is consuming Ensure Enlive supplements.   Per TOC notes, plan to discharge to SNF vs CIR once medically stable.   Medications reviewed and include colace and 0.9% sodium chloride infusion @ 50 ml/hr.   Labs reviewed: Na: 133.   Diet Order:   Diet Order             Diet regular Room service appropriate? Yes; Fluid consistency: Thin  Diet effective now                   EDUCATION NEEDS:   No education needs have been identified at this time  Skin:  Skin Assessment: Reviewed RN Assessment  Last BM:  06/17/21  Height:   Ht Readings from Last 1 Encounters:  06/01/21 '5\' 8"'$  (1.727 m)    Weight:   Wt Readings from Last 1 Encounters:  06/14/21 67 kg    Ideal Body Weight:  70 kg  BMI:  Body mass index is 22.46 kg/m.  Estimated Nutritional  Needs:   Kcal:  [id number]  Protein:  85-100 grams  Fluid:  > 1.7 L    Loistine Chance, RD, LDN, [place] Registered Dietitian II Certified Diabetes Care and Education Specialist Please refer to [facility] for RD and/or RD on-call/weekend/after hours pager

## 2021-06-18 NOTE — Progress Notes (Signed)
Physical Therapy Treatment Patient Details Name: Parker Odonnell. MRN: [id number] DOB: 23-Jul-1932 Today's Date: 06/18/2021    History of Present Illness 87 y.o. male  presented 06/10/21 with rash, weakness. 06/13/21 MRI brain no acute anbormality; MRI spine L1 compression fx, no cord signal changes; 8/29 MRI pelvis with sacral insufficiency fracture   PMH significant of CAD; CHF; HLD; afib on Eliquis; and HTN    PT Comments    Patient with incr RLE pain and weakness compared to previous visit (?true weakness vs inhibition due to pain). Patient with buckling of knees during transfer. Required AAROM RLE during exercises due to pain and weakness. Noted CIR awaiting diagnosis prior to consideration.     Follow Up Recommendations  CIR     Equipment Recommendations  Rolling walker with 5" wheels    Recommendations for Other Services       Precautions / Restrictions Precautions Precautions: Fall Precaution Comments: per son, 8/14 after legs began feeling weak Restrictions Weight Bearing Restrictions: No    Mobility  Bed Mobility Overal bed mobility: Needs Assistance Bed Mobility: Supine to Sit     Supine to sit: Supervision     General bed mobility comments: using HOB elevated and bed rail; incr time and effort (esp once seated to scoot out to EOB)    Transfers Overall transfer level: Needs assistance Equipment used: Rolling walker (2 wheeled) Transfers: Sit to/from Stand Sit to Stand: Mod assist;+2 physical assistance Stand pivot transfers: Max assist;+2 physical assistance       General transfer comment:  (stood x 2 from bed; second attempt pivoted to recliner with max of 2 due to knees buckling and assist to land on chair; stood 2 more times from recliner with mod of 2)  Ambulation/Gait             General Gait Details: unable due to knees buckling   Stairs             Wheelchair Mobility    Modified Rankin (Stroke Patients Only)        Balance Overall balance assessment: Needs assistance Sitting-balance support: Feet supported Sitting balance-Leahy Scale: Fair     Standing balance support: Bilateral upper extremity supported;During functional activity Standing balance-Leahy Scale: Zero Standing balance comment: legs weak and unsteady; support to Rt knee                            Cognition Arousal/Alertness: Awake/alert Behavior During Therapy: Anxious Overall Cognitive Status: Within Functional Limits for tasks assessed                                 General Comments: Anxious due to fear of falling and due to RLE pain      Exercises General Exercises - Lower Extremity Ankle Circles/Pumps: AROM;Both;10 reps Long Arc Quad: AROM;Right;5 reps Heel Slides: AAROM;Right;AROM;Left;5 reps;Supine Hip ABduction/ADduction: AAROM;Right;AROM;Left;10 reps;Supine Straight Leg Raises: AAROM;Right;AROM;Left;5 reps    General Comments        Pertinent Vitals/Pain Pain Assessment: Faces Faces Pain Scale: Hurts whole lot Pain Location:  (RLE) Pain Descriptors / Indicators: Discomfort;Grimacing;Moaning Pain Intervention(s): Limited activity within patient's tolerance;Monitored during session;Patient requesting pain meds-RN notified    Home Living                      Prior Function  PT Goals (current goals can now be found in the care plan section) Acute Rehab PT Goals Patient Stated Goal: Need to move better Time For Goal Achievement: 06/25/21 Potential to Achieve Goals: Good Progress towards PT goals: Not progressing toward goals - comment (?pain limiting use of RLE vs true incr weakness)    Frequency    Min 3X/week      PT Plan Current plan remains appropriate    Co-evaluation              AM-PAC PT "6 Clicks" Mobility   Outcome Measure  Help needed turning from your back to your side while in a flat bed without using bedrails?: None Help needed  moving from lying on your back to sitting on the side of a flat bed without using bedrails?: A Little Help needed moving to and from a bed to a chair (including a wheelchair)?: Total Help needed standing up from a chair using your arms (e.g., wheelchair or bedside chair)?: Total Help needed to walk in hospital room?: Total Help needed climbing 3-5 steps with a railing? : Total 6 Click Score: 11    End of Session Equipment Utilized During Treatment: Gait belt Activity Tolerance: Patient limited by pain Patient left: in chair;with call bell/phone within reach;with chair alarm set Nurse Communication: Mobility status;Need for lift equipment (recommmend stedy) PT Visit Diagnosis: Other abnormalities of gait and mobility (R26.89);Muscle weakness (generalized) (M62.81);History of falling (Z91.81)     Time: [id number] PT Time Calculation (min) (ACUTE ONLY): 34 min  Charges:  $Therapeutic Exercise: 8-22 mins $Therapeutic Activity: 8-22 mins                      Arby Barrette, PT Pager [phone]    Parker Odonnell 06/18/2021, 9:45 AM

## 2021-06-18 NOTE — Care Management Important Message (Signed)
Important Message  Patient Details  Name: Parker Odonnell. MRN: [id number] Date of Birth: [birth year]   Medicare Important Message Given:  Yes     Lina Hitch 06/18/2021, 8:32 AM

## 2021-06-19 LAB — CBC WITH DIFFERENTIAL/PLATELET
Abs Immature Granulocytes: 0.02 10*3/uL (ref 0.00–0.07)
Basophils Absolute: 0 10*3/uL (ref 0.0–0.1)
Basophils Relative: 0 %
Eosinophils Absolute: 0.2 10*3/uL (ref 0.0–0.5)
Eosinophils Relative: 3 %
HCT: 30.6 % — ABNORMAL LOW (ref 39.0–52.0)
Hemoglobin: 10.3 g/dL — ABNORMAL LOW (ref 13.0–17.0)
Immature Granulocytes: 0 %
Lymphocytes Relative: 20 %
Lymphs Abs: 1.1 10*3/uL (ref 0.7–4.0)
MCH: 34 pg (ref 26.0–34.0)
MCHC: 33.7 g/dL (ref 30.0–36.0)
MCV: 101 fL — ABNORMAL HIGH (ref 80.0–100.0)
Monocytes Absolute: 0.4 10*3/uL (ref 0.1–1.0)
Monocytes Relative: 8 %
Neutro Abs: 3.6 10*3/uL (ref 1.7–7.7)
Neutrophils Relative %: 69 %
Platelets: 125 10*3/uL — ABNORMAL LOW (ref 150–400)
RBC: 3.03 MIL/uL — ABNORMAL LOW (ref 4.22–5.81)
RDW: 13.7 % (ref 11.5–15.5)
WBC: 5.3 10*3/uL (ref 4.0–10.5)
nRBC: 0 % (ref 0.0–0.2)

## 2021-06-19 LAB — MAGNESIUM: Magnesium: 1.7 mg/dL (ref 1.7–2.4)

## 2021-06-19 LAB — BRAIN NATRIURETIC PEPTIDE: B Natriuretic Peptide: 220.2 pg/mL — ABNORMAL HIGH (ref 0.0–100.0)

## 2021-06-19 MED ORDER — MAGNESIUM SULFATE IN D5W 1-5 GM/100ML-% IV SOLN
1.0000 g | Freq: Once | INTRAVENOUS | Status: AC
  Administered 2021-06-19: 1 g via INTRAVENOUS
  Filled 2021-06-19 (×2): qty 100

## 2021-06-19 MED ORDER — SODIUM CHLORIDE 0.9 % IV SOLN: INTRAVENOUS | Status: AC

## 2021-06-19 NOTE — Progress Notes (Signed)
Physical Therapy Treatment Patient Details Name: Parker Odonnell. MRN: [id number] DOB: 05-17-1932 Today's Date: 06/19/2021    History of Present Illness 87 y.o. male  presented 06/10/21 with rash, weakness. 06/13/21 MRI brain no acute anbormality; MRI spine L1 compression fx, no cord signal changes; 8/29 MRI pelvis with sacral insufficiency fracture; 8/31 +viral meningitis  PMH significant of CAD; CHF; HLD; afib on Eliquis; and HTN    PT Comments    Patient more anxious re: anticipated pain in bil LEs and fearful of falling. Use of stedy for standing and transfer improved pt's confidence and comfort. Stood x 1 from recliner to [employer] with incr fear and required incr assist compared to use of stedy. NT in and aware of recommendation to use stedy for return to bed.    Follow Up Recommendations  CIR     Equipment Recommendations  Rolling walker with 5" wheels    Recommendations for Other Services       Precautions / Restrictions Precautions Precautions: Fall Precaution Comments: per son, 8/14 after legs began feeling weak Restrictions Weight Bearing Restrictions: No    Mobility  Bed Mobility Overal bed mobility: Needs Assistance Bed Mobility: Supine to Sit     Supine to sit: Supervision     General bed mobility comments: incr time to scoot to EOB once upright sitting    Transfers Overall transfer level: Needs assistance Equipment used: Rolling walker (2 wheeled) Transfers: Sit to/from Stand Sit to Stand: Min assist;+2 safety/equipment;From elevated surface;Mod assist (min with stedy; mod wiht RW)         General transfer comment: used stedy and stood from elevated bed (to accomodate stedy) x 1; from seat of stedy x 3; from recliner to RW x 1 +2 mod assist  Ambulation/Gait             General Gait Details: weight-shifting/pre-gait in stedy x 60 seconds; pivoted with stedy for safety after buckling 8/31   Stairs             Wheelchair Mobility     Modified Rankin (Stroke Patients Only)       Balance Overall balance assessment: Needs assistance Sitting-balance support: Feet supported Sitting balance-Leahy Scale: Fair     Standing balance support: Bilateral upper extremity supported;During functional activity Standing balance-Leahy Scale: Poor Standing balance comment: Stedy used for transfer and standing                            Cognition Arousal/Alertness: Awake/alert Behavior During Therapy: Anxious Overall Cognitive Status: Within Functional Limits for tasks assessed                                 General Comments: Anxious due to pain at RLE and fear of falling during transfer      Exercises      General Comments        Pertinent Vitals/Pain Pain Assessment: Faces Faces Pain Scale: Hurts even more Pain Location: R leg>LLE Pain Descriptors / Indicators: Discomfort;Moaning;Guarding Pain Intervention(s): Limited activity within patient's tolerance;Monitored during session    Home Living                      Prior Function            PT Goals (current goals can now be found in the care plan section) Acute Rehab PT Goals  Patient Stated Goal: Need to move better Time For Goal Achievement: 06/25/21 Potential to Achieve Goals: Good Progress towards PT goals: Progressing toward goals    Frequency    Min 3X/week      PT Plan Current plan remains appropriate    Co-evaluation              AM-PAC PT "6 Clicks" Mobility   Outcome Measure  Help needed turning from your back to your side while in a flat bed without using bedrails?: None Help needed moving from lying on your back to sitting on the side of a flat bed without using bedrails?: A Little Help needed moving to and from a bed to a chair (including a wheelchair)?: Total Help needed standing up from a chair using your arms (e.g., wheelchair or bedside chair)?: Total Help needed to walk in hospital  room?: Total Help needed climbing 3-5 steps with a railing? : Total 6 Click Score: 11    End of Session   Activity Tolerance: Patient limited by fatigue Patient left: in chair;with call bell/phone within reach;with chair alarm set;with nursing/sitter in room Nurse Communication: Mobility status;Need for lift equipment (recommmend stedy) PT Visit Diagnosis: Other abnormalities of gait and mobility (R26.89);Muscle weakness (generalized) (M62.81);History of falling (Z91.81)     Time: [id number] PT Time Calculation (min) (ACUTE ONLY): 21 min  Charges:  $Therapeutic Activity: 8-22 mins                      Arby Barrette, PT Pager [phone]    Rexanne Mano 06/19/2021, 11:10 AM

## 2021-06-19 NOTE — Progress Notes (Signed)
Inpatient Rehab Admissions Coordinator:   I was able to speak with pt's daughter about CIR recommendations and expectations.  I let her know average length of stay was about 2 weeks, depending upon progress, 3 hrs/day of therapy, and expected discharge home with up to 24/7 supervision recommended.  Family was using [employer] to provide 24/7 supervision prior to this admission and can continue this at discharge, if needed.  Pt living in an [facility] prior to admit with plan to return there.  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  Will continue to follow for progress with therapy and discontinuation of isolation before starting insurance auth.   Shann Medal, PT, DPT Admissions Coordinator [phone] 06/19/21  12:11 PM

## 2021-06-19 NOTE — Progress Notes (Signed)
PROGRESS NOTE        PATIENT DETAILS Name: Parker Odonnell. Age: 87 y.o. Sex: male Date of Birth: Apr 11, 1932 Admit Date: 06/10/2021 Admitting Physician Evalee Mutton Kristeen Mans, MD [id number], Dwyane Luo, MD  Brief Narrative: Patient is a 87 y.o. male persistent atrial fibrillation on Eliquis, HFrEF, HTN who presented to the hospital with right leg weakness and a purpuric right leg rash.  Given broad differential diagnosis (infectious/autoimmune/paraneoplastic/structural)-admitted to the [facility] service for further evaluation.  Work-up in progress-see below for results-ID/neurology following.  Significant events: 8/14>> presented to ED with right leg weakness/pain-fall-found to have rash-thought to have shingles-started on Valtrex and discharged  8/23>> admit to [facility] for right leg weakness/purpuric right leg rash eval/management 8/24>> punch biopsy by surgery 8/25>> LP-CSF w leukocytosis (lymphocytes) and elevated protein-Acyclovir started.  Significant radiological studies:  8/14>> CT head: No acute intracranial findings. 8/25>> MRI brain: No evidence of encephalitis/paraneoplastic disease/intracranial infection. 8/25>> MRA brain: No evidence of CNS vasculitis-no major stenosis. 8/25>> MRI thoracic/lumbar-spine: Compression fracture superior endplate of L1, acute fracture of left sacrum, mild edema within spinous process of T4/T5.  No MRI changes to suggest myelopathy. 8/27>> CT chest: 5 mm nodule in right major fissure-possibly intrapulmonary lymph node 8/27>> CT abdomen: No mass/adenopathy-Marked prostate enlargement-mild bilateral hydronephrosis.  Significant serological studies: 8/23>> TSH: Within normal limit 8/24>> RA factor: Negative 8/24>> ANA: Negative 8/24>> ds DNA antibody/anti-smooth antibody: Negative 8/24>> Sjogren's A/Sjogren's B antibody: Negative 8/24>> anti-smooth muscle antibody: Negative 8/24>> ANCA antibodies: Negative 8/24>> cardiolipin  antibodies: Negative 8/24>> Lyme antibody/PCR: Negative 8/24>> C3/C4/total complement: Normal limits 8/24>> C1 esterase inhibitor: Normal limit 8/24>> cryoglobulin: Pending 8/24>> SPEP/UPEP: No M spike noted.  No evidence for monoclonal antibodies 8/24>> Lupus anticoagulant: Not detected 8/25>>CSF Oligoclonal bands/IgG index:pending 8/27>>Vitamin B12: 998  Microbiology data: 8/23>> COVID PCR: Negative 8/23>> blood culture: No growth 8/24>> acute hepatitis panel: Negative 8/24>> HIV: Negative 8/25>> blood culture: No growth 8/25>> CSF cryptococcal antigen: Negative 8/25>> CSF culture: No growth for 3 days 8/25>> CSF VDRL: Non reactive 8/25>> CSF VZV PCR: Pending 8/25>> CSF HSV PCR: Negative 8/25>>CSF ARUP Encephalitis panel:Pending 8/26>>RPR:Non reactive  Pathology data: 8/24>> skin biopsy: Pending 8/25>> CSF cytology: negative for malignancy  Antimicrobial therapy: Acyclovir: 8/25>>  Procedures : 8/24>> punch biopsy performed by general surgery:   8/25>> lumbar puncture by neurology  Consults: Neuro, ID, general surgery  DVT Prophylaxis : apixaban (ELIQUIS) tablet 2.5 mg Start: 06/18/21 2200Eliquis  apixaban (ELIQUIS) tablet 2.5 mg  Subjective:  Patient in bed, appears comfortable, denies any headache, no fever, no chest pain or pressure, no shortness of breath , no abdominal pain. No new focal weakness, gradually improving leg weakness.   Assessment/Plan:  Bilateral lower extremity weakness, right more than left/purpuric right lower extremity rash: Etiology unclear-differentials include infectious/vasculitis/paraneoplastic-extensive work-up in process.  Imaging studies so far unrevealing.  Autoimmune work-up negative so far.  Skin biopsy results still pending. No masses or lymphadenopathy is seen on CT chest/abdomen.  Given lymphocytic pleocytosis and elevated protein on CSF, HSV PCR negative.  VZV PCR is now +ve.  Continue acyclovir for now.  Neurology is planning  for 7 to 14-day course. DW  Neuro 06/18/21 - Skin Biopsy no vasculitis - likely Viral rash.    Hyponatremia: Volume status is stable-although he has a history of HFrEF- wonder if he has developed SIADH physiology.  He is on low-dose  IV fluid because he is on acyclovir. CT chest without any lung mass-although has a 5 mm lung nodule versus intrapulmonary lymph node. Sodium level has been stable.  Persistent atrial fibrillation: Rate controlled-on Coreg-Eliquis.  HFrEF: Volume status is stable-continue to hold diuretics for now.  EF 35 to 40% based on echocardiogram done in 2020.  History of CAD-s/p remote PCI: No anginal symptoms.  Hyperlipidemia: Continue statin  Essential hypertension: BP stable-stopped ARB due to AKI, Coreg and Norvasc combination.  Large prostate with mild hydronephrosis seen on CT abdomen: Renal function stable-started on low-dose Flomax, if Bladder scan > 300 cc x 2 - Foley with Urology follow up.  L1 superior endplate compression fracture/mild edema within spinous process of T4/T5, Sacral insufficiency fracture : Seen incidentally on MRI imaging.  Supportive care, no pain.  5 mm right lower lobe pulmonary nodule versus intrapulmonary lymph node: Repeat noncontrast CT chest in 12 months to reassess.  Mild AKI - IVF, as on Acylovir, continue Foley. DC'd ARB.    Diet: Diet Order             Diet regular Room service appropriate? No; Fluid consistency: Thin  Diet effective now                    Code Status:  DNR  Family Communication:  [id number] called 06/17/21, 06/18/21 - detailed updates, 06/19/21 -message left at 12:27 PM.  Disposition Plan: CIR is recommended by PT and OT.  Status is: Inpatient  The patient will require care spanning > 2 midnights and should be moved to inpatient because: Inpatient level of care appropriate due to severity of illness  Dispo: The patient is from: Home              Anticipated d/c is to: SNF               Patient currently is not medically stable to d/c.   Difficult to place patient No   Barriers to Discharge: Right lower extremity weakness-rash of unknown etiology-work-up in progress  Antimicrobial agents: Anti-infectives (From admission, onward)    Start     Dose/Rate Route Frequency Ordered Stop   06/16/21 2000  acyclovir (ZOVIRAX) 660 mg in dextrose 5 % 100 mL IVPB  Status:  Discontinued        660 mg 113.2 mL/hr over 60 Minutes Intravenous Every 12 hours 06/16/21 0930 06/16/21 1011   06/16/21 2000  acyclovir (ZOVIRAX) 660 mg in dextrose 5 % 100 mL IVPB        660 mg 113.2 mL/hr over 60 Minutes Intravenous Every 12 hours 06/16/21 1021 06/25/21 2359   06/12/21 1815  acyclovir (ZOVIRAX) 660 mg in dextrose 5 % 100 mL IVPB  Status:  Discontinued        10 mg/kg  66.2 kg 113.2 mL/hr over 60 Minutes Intravenous Every 12 hours 06/12/21 1718 06/16/21 0930       MEDICATIONS:  Scheduled Meds:  amLODipine  10 mg Oral Daily   apixaban  2.5 mg Oral BID   atorvastatin  40 mg Oral QHS   carvedilol  3.125 mg Oral BID WC   Chlorhexidine Gluconate Cloth  6 each Topical Daily   docusate sodium  100 mg Oral BID   feeding supplement  237 mL Oral BID BM   multivitamin with minerals  1 tablet Oral Daily   mupirocin cream   Topical BID   pregabalin  50 mg Oral BID   tamsulosin  0.4 mg  Oral Daily   Continuous Infusions:  sodium chloride     acyclovir 660 mg (06/19/21 0810)   magnesium sulfate bolus IVPB     PRN Meds:.acetaminophen **OR** [DISCONTINUED] acetaminophen, bisacodyl, hydrALAZINE, lip balm, liver oil-zinc oxide, [DISCONTINUED] ondansetron **OR** ondansetron (ZOFRAN) IV, polyethylene glycol, polyvinyl alcohol, traMADol   PHYSICAL EXAM: Vital signs: Vitals:   06/19/21 0001 06/19/21 0400 06/19/21 0800 06/19/21 1114  BP: (!) 158/63  (!) 163/88 118/64  Pulse: 74  71 62  Resp: '18  17 17  '$ Temp: 97.6 F (36.4 C) 98 F (36.7 C) (!) 97.5 F (36.4 C) (!) 97.4 F (36.3 C)   TempSrc: Oral Oral Oral Oral  SpO2: 94%  98% 95%  Weight:       Filed Weights   06/14/21 0407  Weight: 67 kg   Body mass index is 22.46 kg/m.   Exam:  Awake Alert, No new F.N deficits, Normal affect [place].AT,PERRAL Supple Neck,No JVD, No cervical lymphadenopathy appriciated.  Symmetrical Chest wall movement, Good air movement bilaterally, CTAB RRR,No Gallops, Rubs or new Murmurs, No Parasternal Heave +ve B.Sounds, Abd Soft, No tenderness, No organomegaly appriciated, No rebound - guarding or rigidity. Weakness noted bilateral lower extremities.  Right greater than left. Purpuric rash on right lower extremity >> Left.    I have personally reviewed following labs and imaging studies  LABORATORY DATA:  Recent Labs  Lab 06/12/21 1455 06/13/21 0224 06/14/21 0111 06/15/21 0152 06/16/21 0215 06/18/21 0140 06/19/21 0320  WBC 5.3   < > 7.0 7.1 6.0 6.2 5.3  HGB 12.4*   < > 11.1* 11.1* 10.5* 10.0* 10.3*  HCT 36.4*   < > 31.5* 32.3* 30.6* 29.6* 30.6*  PLT 150   < > 147* 148* 137* 135* 125*  MCV 98.6   < > 96.6 99.4 99.0 100.3* 101.0*  MCH 33.6   < > 34.0 34.2* 34.0 33.9 34.0  MCHC 34.1   < > 35.2 34.4 34.3 33.8 33.7  RDW 13.2   < > 13.3 13.5 13.5 13.7 13.7  LYMPHSABS 0.9  --   --   --   --  0.9 1.1  MONOABS 0.6  --   --   --   --  0.5 0.4  EOSABS 0.2  --   --   --   --  0.2 0.2  BASOSABS 0.0  --   --   --   --  0.0 0.0   < > = values in this interval not displayed.    Recent Labs  Lab 06/13/21 0224 06/14/21 0111 06/15/21 0152 06/16/21 0215 06/18/21 0140 06/19/21 0320  NA 128* 130* 131* 131* 133*  --   K 3.9 4.3 4.8 4.1 4.6  --   CL 102 102 102 102 107  --   CO2 22 21* 24 24 20*  --   GLUCOSE 119* 104* 122* 115* 103*  --   BUN '13 17 14 14 20  '$ --   CREATININE 0.82 0.83 0.98 1.02 1.33*  --   CALCIUM 8.8* 9.1 9.3 9.1 8.9  --   AST '23 17 16 16 26  '$ --   ALT '24 22 21 21 18  '$ --   ALKPHOS 99 113 110 105 100  --   BILITOT 1.9* 1.3* 1.9* 1.4* 1.2  --   ALBUMIN 3.0*  2.9* 2.9* 2.9* 2.6*  --   MG  --   --   --   --  1.9 1.7  BNP  --   --   --   --  106.3* 220.2*       Urine analysis:    Component Value Date/Time   COLORURINE YELLOW 06/17/2021 [place] 06/17/2021 0758   LABSPEC 1.006 06/17/2021 0758   PHURINE 7.0 06/17/2021 0758   GLUCOSEU NEGATIVE 06/17/2021 0758   HGBUR NEGATIVE 06/17/2021 0758   BILIRUBINUR NEGATIVE 06/17/2021 0758   KETONESUR NEGATIVE 06/17/2021 0758   PROTEINUR NEGATIVE 06/17/2021 0758   UROBILINOGEN 0.2 06/13/2012 1027   NITRITE NEGATIVE 06/17/2021 0758   LEUKOCYTESUR NEGATIVE 06/17/2021 0758      RADIOLOGY STUDIES/RESULTS:   No results found.   LOS: 8 days   Signature  Lala Lund M.D on 06/19/2021 at 12:26 PM   -  To page go to [email or web address]

## 2021-06-20 DIAGNOSIS — R21 Rash and other nonspecific skin eruption: Secondary | ICD-10-CM | POA: Diagnosis not present

## 2021-06-20 LAB — CBC WITH DIFFERENTIAL/PLATELET
Abs Immature Granulocytes: 0.02 10*3/uL (ref 0.00–0.07)
Basophils Absolute: 0 10*3/uL (ref 0.0–0.1)
Basophils Relative: 0 %
Eosinophils Absolute: 0.1 10*3/uL (ref 0.0–0.5)
Eosinophils Relative: 3 %
HCT: 28.9 % — ABNORMAL LOW (ref 39.0–52.0)
Hemoglobin: 9.9 g/dL — ABNORMAL LOW (ref 13.0–17.0)
Immature Granulocytes: 1 %
Lymphocytes Relative: 22 %
Lymphs Abs: 1 10*3/uL (ref 0.7–4.0)
MCH: 34 pg (ref 26.0–34.0)
MCHC: 34.3 g/dL (ref 30.0–36.0)
MCV: 99.3 fL (ref 80.0–100.0)
Monocytes Absolute: 0.4 10*3/uL (ref 0.1–1.0)
Monocytes Relative: 9 %
Neutro Abs: 2.8 10*3/uL (ref 1.7–7.7)
Neutrophils Relative %: 65 %
Platelets: 125 10*3/uL — ABNORMAL LOW (ref 150–400)
RBC: 2.91 MIL/uL — ABNORMAL LOW (ref 4.22–5.81)
RDW: 13.6 % (ref 11.5–15.5)
WBC: 4.4 10*3/uL (ref 4.0–10.5)
nRBC: 0 % (ref 0.0–0.2)

## 2021-06-20 LAB — COMPREHENSIVE METABOLIC PANEL
ALT: 19 U/L (ref 0–44)
AST: 18 U/L (ref 15–41)
Albumin: 2.7 g/dL — ABNORMAL LOW (ref 3.5–5.0)
Alkaline Phosphatase: 101 U/L (ref 38–126)
Anion gap: 4 — ABNORMAL LOW (ref 5–15)
BUN: 16 mg/dL (ref 8–23)
CO2: 25 mmol/L (ref 22–32)
Calcium: 8.8 mg/dL — ABNORMAL LOW (ref 8.9–10.3)
Chloride: 104 mmol/L (ref 98–111)
Creatinine, Ser: 1.11 mg/dL (ref 0.61–1.24)
GFR, Estimated: >60 mL/min (ref >60)
Glucose, Bld: 102 mg/dL — ABNORMAL HIGH (ref 70–99)
Potassium: 4.1 mmol/L (ref 3.5–5.1)
Sodium: 133 mmol/L — ABNORMAL LOW (ref 135–145)
Total Bilirubin: 1 mg/dL (ref 0.3–1.2)
Total Protein: 5.3 g/dL — ABNORMAL LOW (ref 6.5–8.1)

## 2021-06-20 MED ORDER — CARVEDILOL 6.25 MG PO TABS
6.2500 mg | ORAL_TABLET | Freq: Two times a day (BID) | ORAL | Status: DC
  Administered 2021-06-20 – 2021-06-25 (×11): 6.25 mg via ORAL
  Filled 2021-06-20 (×11): qty 1

## 2021-06-20 NOTE — Progress Notes (Signed)
Inpatient Rehab Admissions Coordinator:   Notified that pt should be off airborne precautions tomorrow (Saturday).  I will go ahead and start insurance authorization today, since [facility] is closed on Monday.  We still won't expect to hear an answer before Tuesday due to the holiday weekend.    Shann Medal, PT, DPT Admissions Coordinator [phone] 06/20/21  11:13 AM

## 2021-06-20 NOTE — Progress Notes (Signed)
PROGRESS NOTE        PATIENT DETAILS Name: Parker Odonnell. Age: 87 y.o. Sex: male Date of Birth: 06-15-1932 Admit Date: 06/10/2021 Admitting Physician Evalee Mutton Kristeen Mans, MD PF:[id number], Dwyane Luo, MD  Brief Narrative: Patient is a 87 y.o. male persistent atrial fibrillation on Eliquis, HFrEF, HTN who presented to the hospital with right leg weakness and a purpuric right leg rash.  Given broad differential diagnosis (infectious/autoimmune/paraneoplastic/structural)-admitted to the [facility] service for further evaluation.  Work-up in progress-see below for results-ID/neurology following.  Significant events: 8/14>> presented to ED with right leg weakness/pain-fall-found to have rash-thought to have shingles-started on Valtrex and discharged  8/23>> admit to [facility] for right leg weakness/purpuric right leg rash eval/management 8/24>> punch biopsy by surgery 8/25>> LP-CSF w leukocytosis (lymphocytes) and elevated protein-Acyclovir started.  Significant radiological studies:  8/14>> CT head: No acute intracranial findings. 8/25>> MRI brain: No evidence of encephalitis/paraneoplastic disease/intracranial infection. 8/25>> MRA brain: No evidence of CNS vasculitis-no major stenosis. 8/25>> MRI thoracic/lumbar-spine: Compression fracture superior endplate of L1, acute fracture of left sacrum, mild edema within spinous process of T4/T5.  No MRI changes to suggest myelopathy. 8/27>> CT chest: 5 mm nodule in right major fissure-possibly intrapulmonary lymph node 8/27>> CT abdomen: No mass/adenopathy-Marked prostate enlargement-mild bilateral hydronephrosis.  Significant serological studies: 8/23>> TSH: Within normal limit 8/24>> RA factor: Negative 8/24>> ANA: Negative 8/24>> ds DNA antibody/anti-smooth antibody: Negative 8/24>> Sjogren's A/Sjogren's B antibody: Negative 8/24>> anti-smooth muscle antibody: Negative 8/24>> ANCA antibodies: Negative 8/24>> cardiolipin  antibodies: Negative 8/24>> Lyme antibody/PCR: Negative 8/24>> C3/C4/total complement: Normal limits 8/24>> C1 esterase inhibitor: Normal limit 8/24>> cryoglobulin: Pending 8/24>> SPEP/UPEP: No M spike noted.  No evidence for monoclonal antibodies 8/24>> Lupus anticoagulant: Not detected 8/25>>CSF Oligoclonal bands/IgG index:pending 8/27>>Vitamin B12: 998  Microbiology data: 8/23>> COVID PCR: Negative 8/23>> blood culture: No growth 8/24>> acute hepatitis panel: Negative 8/24>> HIV: Negative 8/25>> blood culture: No growth 8/25>> CSF cryptococcal antigen: Negative 8/25>> CSF culture: No growth for 3 days 8/25>> CSF VDRL: Non reactive 8/25>> CSF VZV PCR: Pending 8/25>> CSF HSV PCR: Negative 8/25>>CSF ARUP Encephalitis panel:Pending 8/26>>RPR:Non reactive  Pathology data: 8/24>> skin biopsy: Pending 8/25>> CSF cytology: negative for malignancy  Antimicrobial therapy: Acyclovir: 8/25>>  Procedures : 8/24>> punch biopsy performed by general surgery:   8/25>> lumbar puncture by neurology  Consults: Neuro, ID, general surgery  DVT Prophylaxis : apixaban (ELIQUIS) tablet 2.5 mg Start: 06/18/21 2200Eliquis  apixaban (ELIQUIS) tablet 2.5 mg   Subjective:  Patient in bed, appears comfortable, denies any headache, no fever, no chest pain or pressure, no shortness of breath , no abdominal pain. No new focal weakness.   Assessment/Plan:  Bilateral lower extremity weakness, right more than left/purpuric right lower extremity rash: Etiology unclear-differentials include infectious/vasculitis/paraneoplastic-extensive work-up in process.  Imaging studies so far unrevealing.  Autoimmune work-up negative so far.  Skin biopsy results still pending. No masses or lymphadenopathy is seen on CT chest/abdomen.  Given lymphocytic pleocytosis and elevated protein on CSF, HSV PCR negative.  VZV PCR is now +ve.  Continue acyclovir for stop date 06/25/21.    Viral Leg rash - all lesions are old and  fully crusted, DC isolation 06/21/21.  Hyponatremia: Volume status is stable-although he has a history of HFrEF- wonder if he has developed SIADH physiology.  He is on low-dose IV fluid because he is on acyclovir. CT  chest without any lung mass-although has a 5 mm lung nodule versus intrapulmonary lymph node. Sodium level has been stable.  Persistent atrial fibrillation: Rate controlled-on Coreg-Eliquis.  HFrEF: Volume status is stable-continue to hold diuretics for now.  EF 35 to 40% based on echocardiogram done in 2020.  History of CAD-s/p remote PCI: No anginal symptoms.  Hyperlipidemia: Continue statin  Essential hypertension: BP stable-stopped ARB due to AKI, Coreg and Norvasc combination.  Large prostate with mild hydronephrosis seen on CT abdomen: Renal function stable-started on low-dose Flomax, if Bladder scan > 300 cc x 2 - Foley with Urology follow up.  L1 superior endplate compression fracture/mild edema within spinous process of T4/T5, Sacral insufficiency fracture : Seen incidentally on MRI imaging.  Supportive care, no pain.  5 mm right lower lobe pulmonary nodule versus intrapulmonary lymph node: Repeat noncontrast CT chest in 12 months to reassess.  Mild AKI - IVF, as on Acylovir, continue Foley. DC'd ARB.   Diet: Diet Order             Diet regular Room service appropriate? No; Fluid consistency: Thin  Diet effective now                    Code Status:  DNR  Family Communication:  [id number] called 06/17/21, 06/18/21 - detailed updates, 06/19/21 -message left at 12:27 PM.  Disposition Plan: CIR is recommended by PT and OT.  Status is: Inpatient  The patient will require care spanning > 2 midnights and should be moved to inpatient because: Inpatient level of care appropriate due to severity of illness  Dispo: The patient is from: Home              Anticipated d/c is to: SNF              Patient currently is not medically stable to d/c.    Difficult to place patient No   Barriers to Discharge: Right lower extremity weakness-rash of unknown etiology-work-up in progress  Antimicrobial agents: Anti-infectives (From admission, onward)    Start     Dose/Rate Route Frequency Ordered Stop   06/16/21 2000  acyclovir (ZOVIRAX) 660 mg in dextrose 5 % 100 mL IVPB  Status:  Discontinued        660 mg 113.2 mL/hr over 60 Minutes Intravenous Every 12 hours 06/16/21 0930 06/16/21 1011   06/16/21 2000  acyclovir (ZOVIRAX) 660 mg in dextrose 5 % 100 mL IVPB        660 mg 113.2 mL/hr over 60 Minutes Intravenous Every 12 hours 06/16/21 1021 06/25/21 2359   06/12/21 1815  acyclovir (ZOVIRAX) 660 mg in dextrose 5 % 100 mL IVPB  Status:  Discontinued        10 mg/kg  66.2 kg 113.2 mL/hr over 60 Minutes Intravenous Every 12 hours 06/12/21 1718 06/16/21 0930       MEDICATIONS:  Scheduled Meds:  amLODipine  10 mg Oral Daily   apixaban  2.5 mg Oral BID   atorvastatin  40 mg Oral QHS   carvedilol  6.25 mg Oral BID WC   Chlorhexidine Gluconate Cloth  6 each Topical Daily   docusate sodium  100 mg Oral BID   feeding supplement  237 mL Oral BID BM   multivitamin with minerals  1 tablet Oral Daily   mupirocin cream   Topical BID   pregabalin  50 mg Oral BID   tamsulosin  0.4 mg Oral Daily   Continuous Infusions:  sodium chloride  50 mL/hr at 06/19/21 1324   acyclovir 660 mg (06/20/21 0848)   PRN Meds:.acetaminophen **OR** [DISCONTINUED] acetaminophen, bisacodyl, hydrALAZINE, lip balm, liver oil-zinc oxide, [DISCONTINUED] ondansetron **OR** ondansetron (ZOFRAN) IV, polyethylene glycol, polyvinyl alcohol, traMADol   PHYSICAL EXAM: Vital signs: Vitals:   06/19/21 2357 06/20/21 0416 06/20/21 0700 06/20/21 1232  BP: 122/90 (!) 159/83 (!) 166/72 (!) 111/50  Pulse: 63 64 63 61  Resp: '12 14 17 13  '$ Temp: 98.2 F (36.8 C) 98.6 F (37 C) 98.7 F (37.1 C) 98.8 F (37.1 C)  TempSrc: Oral Axillary Oral Oral  SpO2: 94% 95% 96% 96%  Weight:        Filed Weights   06/14/21 0407  Weight: 67 kg   Body mass index is 22.46 kg/m.   Exam:  Awake Alert, No new F.N deficits, Normal affect [place].AT,PERRAL Supple Neck,No JVD, No cervical lymphadenopathy appriciated.  Symmetrical Chest wall movement, Good air movement bilaterally, CTAB RRR,No Gallops, Rubs or new Murmurs, No Parasternal Heave +ve B.Sounds, Abd Soft, No tenderness, No organomegaly appriciated, No rebound - guarding or rigidity. Weakness noted bilateral lower extremities.  Right greater than left. Purpuric rash on right lower extremity >> Left.    I have personally reviewed following labs and imaging studies  LABORATORY DATA:  Recent Labs  Lab 06/15/21 0152 06/16/21 0215 06/18/21 0140 06/19/21 0320 06/20/21 0100  WBC 7.1 6.0 6.2 5.3 4.4  HGB 11.1* 10.5* 10.0* 10.3* 9.9*  HCT 32.3* 30.6* 29.6* 30.6* 28.9*  PLT 148* 137* 135* 125* 125*  MCV 99.4 99.0 100.3* 101.0* 99.3  MCH 34.2* 34.0 33.9 34.0 34.0  MCHC 34.4 34.3 33.8 33.7 34.3  RDW 13.5 13.5 13.7 13.7 13.6  LYMPHSABS  --   --  0.9 1.1 1.0  MONOABS  --   --  0.5 0.4 0.4  EOSABS  --   --  0.2 0.2 0.1  BASOSABS  --   --  0.0 0.0 0.0    Recent Labs  Lab 06/14/21 0111 06/15/21 0152 06/16/21 0215 06/18/21 0140 06/19/21 0320 06/20/21 0100  NA 130* 131* 131* 133*  --  133*  K 4.3 4.8 4.1 4.6  --  4.1  CL 102 102 102 107  --  104  CO2 21* 24 24 20*  --  25  GLUCOSE 104* 122* 115* 103*  --  102*  BUN '17 14 14 20  '$ --  16  CREATININE 0.83 0.98 1.02 1.33*  --  1.11  CALCIUM 9.1 9.3 9.1 8.9  --  8.8*  AST '17 16 16 26  '$ --  18  ALT '22 21 21 18  '$ --  19  ALKPHOS 113 110 105 100  --  101  BILITOT 1.3* 1.9* 1.4* 1.2  --  1.0  ALBUMIN 2.9* 2.9* 2.9* 2.6*  --  2.7*  MG  --   --   --  1.9 1.7  --   BNP  --   --   --  106.3* 220.2*  --        Urine analysis:    Component Value Date/Time   COLORURINE YELLOW 06/17/2021 0758   APPEARANCEUR CLEAR 06/17/2021 0758   LABSPEC 1.006 06/17/2021 0758    PHURINE 7.0 06/17/2021 0758   GLUCOSEU NEGATIVE 06/17/2021 0758   HGBUR NEGATIVE 06/17/2021 [place] 06/17/2021 0758   KETONESUR NEGATIVE 06/17/2021 0758   PROTEINUR NEGATIVE 06/17/2021 0758   UROBILINOGEN 0.2 06/13/2012 1027   NITRITE NEGATIVE 06/17/2021 0758   LEUKOCYTESUR NEGATIVE  06/17/2021 0758    RADIOLOGY STUDIES/RESULTS:   No results found.   LOS: 9 days   Signature  Lala Lund M.D on 06/20/2021 at 12:37 PM   -  To page go to [email or web address]

## 2021-06-20 NOTE — PMR Pre-admission (Signed)
PMR Admission Coordinator Pre-Admission Assessment  Patient: Parker Odonnell. is an 87 y.o., male MRN: [id number] DOB: 10-29-1931 Height: 5\' 8"  (172.7 cm) Weight: 67 kg  Insurance Information HMO:     PPO: yes     PCP:      IPA:      80/20:      OTHER:  PRIMARY: [employer] Medicare      Policy#: [id number]      Subscriber: pt CM Name: Lattie Haw      Phone#: [phone]     Fax#: [phone] Pre-Cert#: [id number] [place] for CIR provided by Lattie Haw with [employer] Expeditd Appeals with updates due to fax listed above on 9/13      Employer:  Benefits:  Phone #: [phone]     Name:  Eff. Date: 10/19/20     Deduct: $200 (met $200)      Out of Pocket Max: $2200 (met $200)      Life Max: n/a CIR: 100%       SNF: 100% once deductible met Outpatient: 100% once deductible met     Co-Pay:  Home Health: 100% once deductible met      Co-Pay:  DME: 100%     Co-Pay:  Providers:  SECONDARY:       Policy#:      Phone#:   Development worker, community:       Phone#:   The Therapist, art Information Summary" for patients in Inpatient Rehabilitation Facilities with attached "Privacy Act [place] Records" was provided and verbally reviewed with: Patient  Emergency Contact Information Contact Information     Name Relation Home Work Mobile   [place] Daughter [phone]  [phone]   Chandler, Swiderski [phone]  [phone]   [facility] PAGE Daughter   [phone]   Sayan, Aldava   [phone]       Current Medical History  Patient Admitting Diagnosis: VZV meningitis   History of Present Illness: Pt is a 87 y/o male with persistent afrib, on eliquis, HFrEF, and HTN who presented to [facility] on 8/11 with BLE weakness and purpuric RLE rash.  Pt was admitted for extensive workup.  Neuro consulted and recommended CT head, MRI/A brain, MRI whole spine, a wide range of serological studies, and an LP, all of which was negative except MRI spine showing compression fractures of L1, L sacrum, and  compression fracture of T4/5, and LP which showed +VZV in CSF.  Diagnosed with VZV meningitis.  ID following and recommended 14 days of IV Acyclovir, contact/airborne precautions through 9/3, IV fluids, and therapy.  Therapy evaluations were completed and pt was recommended for CIR.     Patient's medical record from [facility] has been reviewed by the rehabilitation admission coordinator and physician.  Past Medical History  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   Cancer (HCC)    CHF (congestive heart failure) (HCC)    Coronary atherosclerosis of native coronary artery    DNR (do not resuscitate) 06/10/2021   Dyslipidemia    ED (erectile dysfunction)    GERD (gastroesophageal reflux disease)    hx of   History of kidney stones    Hypertension    Inguinal hernia    Left bundle branch block    chronic   Postsurgical percutaneous transluminal coronary angioplasty status    Shingles     Has the patient had major surgery during 100 days prior to admission? Yes  Family  History   family history includes Arthritis in his father; Hypertension in his paternal uncle.  Current Medications  Current Facility-Administered Medications:    acetaminophen (TYLENOL) tablet 650 mg, 650 mg, Oral, Q6H PRN, 650 mg at 06/21/21 2243 **OR** [DISCONTINUED] acetaminophen (TYLENOL) suppository 650 mg, 650 mg, Rectal, Q6H PRN, Karmen Bongo, MD   acyclovir (ZOVIRAX) 660 mg in dextrose 5 % 100 mL IVPB, 660 mg, Intravenous, Q12H, Rozann Lesches, RPH, Last Rate: 113.2 mL/hr at 06/25/21 0851, 660 mg at 06/25/21 0851   amLODipine (NORVASC) tablet 10 mg, 10 mg, Oral, Daily, Thurnell Lose, MD, 10 mg at 06/25/21 0847   apixaban (ELIQUIS) tablet 2.5 mg, 2.5 mg, Oral, BID, Thurnell Lose, MD, 2.5 mg at 06/25/21 0847   atorvastatin (LIPITOR) tablet 40 mg, 40 mg, Oral, QHS, Karmen Bongo, MD, 40 mg at 06/24/21 2106   bisacodyl  (DULCOLAX) EC tablet 5 mg, 5 mg, Oral, Daily PRN, Karmen Bongo, MD   carvedilol (COREG) tablet 6.25 mg, 6.25 mg, Oral, BID WC, Thurnell Lose, MD, 6.25 mg at 06/25/21 0847   Chlorhexidine Gluconate Cloth 2 % PADS 6 each, 6 each, Topical, Daily, Thurnell Lose, MD, 6 each at 06/25/21 0848   docusate sodium (COLACE) capsule 100 mg, 100 mg, Oral, BID, Karmen Bongo, MD, 100 mg at 06/25/21 0847   feeding supplement (ENSURE ENLIVE / ENSURE PLUS) liquid 237 mL, 237 mL, Oral, TID BM, Thurnell Lose, MD, 237 mL at 06/25/21 0848   hydrALAZINE (APRESOLINE) injection 10 mg, 10 mg, Intravenous, Q6H PRN, Thurnell Lose, MD   lip balm (CARMEX) ointment 1 application, 1 application, Topical, PRN, Bonnielee Haff, MD   liver oil-zinc oxide (DESITIN) 40 % ointment, , Topical, TID PRN, Bonnielee Haff, MD   multivitamin with minerals tablet 1 tablet, 1 tablet, Oral, Daily, Ghimire, Henreitta Leber, MD, 1 tablet at 06/25/21 0847   mupirocin cream (BACTROBAN) 2 %, , Topical, BID, Karmen Bongo, MD, Given at 06/25/21 0848   [DISCONTINUED] ondansetron (ZOFRAN) tablet 4 mg, 4 mg, Oral, Q6H PRN **OR** ondansetron (ZOFRAN) injection 4 mg, 4 mg, Intravenous, Q6H PRN, Karmen Bongo, MD   polyethylene glycol (MIRALAX / GLYCOLAX) packet 17 g, 17 g, Oral, Daily PRN, Karmen Bongo, MD   polyvinyl alcohol (LIQUIFILM TEARS) 1.4 % ophthalmic solution 1 drop, 1 drop, Both Eyes, BID PRN, Heloise Purpura, RPH   pregabalin (LYRICA) capsule 50 mg, 50 mg, Oral, BID, Lora Havens, MD, 50 mg at 06/25/21 0847   tamsulosin (FLOMAX) capsule 0.4 mg, 0.4 mg, Oral, Daily, Ghimire, Shanker M, MD, 0.4 mg at 06/25/21 0847   traMADol (ULTRAM) tablet 50 mg, 50 mg, Oral, Q6H PRN, Karmen Bongo, MD, 50 mg at 06/25/21 1233  Patients Current Diet:  Diet Order             Diet regular Room service appropriate? No; Fluid consistency: Thin  Diet effective now                   Precautions /  Restrictions Precautions Precautions: Fall Precaution Comments: unsteady Restrictions Weight Bearing Restrictions: No   Has the patient had 2 or more falls or a fall with injury in the past year? Yes  Prior Activity Level Limited Community (1-2x/wk): living in [place], was completely independent until early Aug when he started needing to use AE for mobility/ADLs, and was requiring 24/7 supervision/assist from comfort keepers  Prior Functional Level Self Care: Did the patient need help bathing, dressing, using  the toilet or eating? Independent  Indoor Mobility: Did the patient need assistance with walking from room to room (with or without device)? Independent  Stairs: Did the patient need assistance with internal or external stairs (with or without device)? Independent  Functional Cognition: Did the patient need help planning regular tasks such as shopping or remembering to take medications? Independent  Patient Information Are you of Hispanic, Latino/a,or Spanish origin?: A. No, not of Hispanic, Latino/a, or Spanish origin What is your race?: A. White Do you need or want an interpreter to communicate with a doctor or health care staff?: 0. No  Patient's Response To:  Health Literacy and Transportation Is the patient able to respond to health literacy and transportation needs?: Yes Health Literacy - How often do you need to have someone help you when you read instructions, pamphlets, or other written material from your doctor or pharmacy?: Sometimes In the past 12 months, has lack of transportation kept you from medical appointments or from getting medications?: No In the past 12 months, has lack of transportation kept you from meetings, work, or from getting things needed for daily living?: No  Home Assistive Devices / [place] Devices/Equipment: Radio producer (specify quad or straight) Home Equipment: Walker - 2 wheels, Grab bars - tub/shower, Grab bars -  toilet, Shower seat  Prior Device Use: Indicate devices/aids used by the patient prior to current illness, exacerbation or injury?  At baseline does not use DME except occasionally a RW.   Current Functional Level Cognition  Overall Cognitive Status: Within Functional Limits for tasks assessed Orientation Level: Disoriented to time, Disoriented to place General Comments: pt a&ox4    Extremity Assessment (includes Sensation/Coordination)  Upper Extremity Assessment: Defer to OT evaluation  Lower Extremity Assessment: Defer to PT evaluation RLE Deficits / Details: hip/knee extensors 3+; ankle DF 5/5, PF 4/5 RLE Sensation: history of peripheral neuropathy (hypersensitive Rt foot) LLE Deficits / Details: hip/knee extensors 4/5, ankle DF 5/5, PF 4/5 LLE Sensation: history of peripheral neuropathy    ADLs  Overall ADL's : Needs assistance/impaired Eating/Feeding: Set up, Sitting Grooming: Wash/dry hands, Wash/dry face, Oral care, Minimal assistance, Sitting Grooming Details (indicate cue type and reason): Performed seated in recliner with assistance to load toothbrush Upper Body Bathing: Set up, Sitting Lower Body Bathing: Maximal assistance, Sit to/from stand Lower Body Bathing Details (indicate cue type and reason):  (Patient stood at RW to perform perineal cleaning with max assist) Upper Body Dressing : Minimal assistance, Sitting Lower Body Dressing: Moderate assistance, Sit to/from stand Toilet Transfer: Minimal assistance, BSC, Stand-pivot Functional mobility during ADLs: Minimal assistance, Rolling walker General ADL Comments: Unsafe seated on EOB    Mobility  Overal bed mobility: Needs Assistance Bed Mobility: Supine to Sit Supine to sit: Supervision Sit to supine: Min guard General bed mobility comments:  (Patient up in recliner with complaints of discomfort)    Transfers  Overall transfer level: Needs assistance Equipment used: Rolling walker (2 wheeled) Transfer via  Lift Equipment: Stedy Transfers: Sit to/from Stand Sit to Stand: Mod assist, +2 physical assistance (using walker mod assist +2, min assist +2 with stedy) Stand pivot transfers: Max assist General transfer comment: performed one sit to stand at rw and 3 from stedy.  very fearful of falling    Ambulation / Gait / Stairs / Wheelchair Mobility  Ambulation/Gait Ambulation/Gait assistance: Min assist, +2 safety/equipment Gait Distance (Feet): 2 Feet Assistive device: Rolling walker (2 wheeled) Gait Pattern/deviations: Step-to pattern General Gait Details: pregait/weight shifting  in standing in stedy with right knee buckling    Posture / Balance Dynamic Sitting Balance Sitting balance - Comments: seated in recliner Balance Overall balance assessment: Needs assistance Sitting-balance support: No upper extremity supported, Feet supported Sitting balance-Leahy Scale: Fair Sitting balance - Comments: seated in recliner Postural control: Right lateral lean Standing balance support: Bilateral upper extremity supported Standing balance-Leahy Scale: Poor Standing balance comment: reliant on RW/UE support    Special needs/care consideration Continuous Drip IV  acyclovir 113.2 ml/hr and Skin rash to BLEs   Previous Home Environment (from acute therapy documentation) Living Arrangements: Alone Available Help at Discharge: Family, Available PRN/intermittently, Other (Comment) (facility staff assist with showers and meds.) Type of Home: Apartment Home Layout: One level Home Access: Level entry, Elevator Bathroom Shower/Tub: Multimedia programmer: Handicapped height Bathroom Accessibility: Yes How Accessible: Accessible via wheelchair Home Care Services: No Additional Comments: Patient lives in [place] with no other levels of care. Recommending post-acute rehab  Discharge Living Setting Plans for Discharge Living Setting: Other (Comment) ([place] [facility]) Type of Home at Discharge:  [place] Name at Discharge: [place]: One level Does the patient have any problems obtaining your medications?: No  Social/Family/Support Systems Anticipated Caregiver: hired caregivers, [employer] Anticipated Ambulance person Information: daughter, [name], is primary contact: [phone] Ability/Limitations of Caregiver: family works Building control surveyor Availability: 24/7 Discharge Plan Discussed with Primary Caregiver: Yes Is Caregiver In Agreement with Plan?: Yes Does Caregiver/Family have Issues with Lodging/Transportation while Pt is in Rehab?: No  Goals Patient/Family Goal for Rehab: , Expected length of stay: 7-10 days Additional Information: plan to return to [place] with hired caregivers Pt/Family Agrees to Admission and willing to participate: Yes Program Orientation Provided & Reviewed with Pt/Caregiver Including Roles  & Responsibilities: Yes  Barriers to Discharge: Insurance for SNF coverage  Decrease burden of Care through IP rehab admission: n/a  Possible need for SNF placement upon discharge: Not anticipated  Patient Condition: I have reviewed medical records from [facility], spoken with CM, and patient and daughter. I met with patient at the bedside and discussed via phone for inpatient rehabilitation assessment.  Patient will benefit from ongoing PT, OT, and SLP, can actively participate in 3 hours of therapy a day 5 days of the week, and can make measurable gains during the admission.  Patient will also benefit from the coordinated team approach during an Inpatient Acute Rehabilitation admission.  The patient will receive intensive therapy as well as Rehabilitation physician, nursing, social worker, and care management interventions.  Due to bladder management, bowel management, safety, skin/wound care, disease management, medication administration, pain management, and patient education the patient  requires 24 hour a day rehabilitation nursing.  The patient is currently min assist with mobility and basic ADLs.  Discharge setting and therapy post discharge at home with home health is anticipated.  Patient has agreed to participate in the Acute Inpatient Rehabilitation Program and will admit today.  Preadmission Screen Completed By:  Michel Santee, PT, DPT 06/25/2021 2:23 PM ______________________________________________________________________   Discussed status with Dr. Posey Pronto on 06/25/21   at 2:23 PM  and received approval for admission today.  Admission Coordinator:  Michel Santee, PT,DPT  time 2:23 PM Sudie Grumbling 06/25/21    Assessment/Plan: Diagnosis: VZV meningitis  Does the need for close, 24 hr/day Medical supervision in concert with the patient's rehab needs make it unreasonable for this patient to be served in a less intensive setting? Yes Co-Morbidities  requiring supervision/potential complications: persistent afrib, HFrEF, and HTN (monitor and provide prns in accordance with increased physical exertion and pain) Due to bladder management, safety, skin/wound care, disease management, medication administration, and patient education, does the patient require 24 hr/day rehab nursing? Yes Does the patient require coordinated care of a physician, rehab nurse, PT, OT, and SLP to address physical and functional deficits in the context of the above medical diagnosis(es)? Yes Addressing deficits in the following areas: balance, endurance, locomotion, strength, transferring, bathing, dressing, toileting, cognition, and psychosocial support Can the patient actively participate in an intensive therapy program of at least 3 hrs of therapy 5 days a week? Yes The potential for patient to make measurable gains while on inpatient rehab is excellent Anticipated functional outcomes upon discharge from inpatient rehab: supervision PT, supervision OT, modified independent and supervision SLP Estimated  rehab length of stay to reach the above functional goals is: 13-17 days. Anticipated discharge destination: Home 10. Overall Rehab/Functional Prognosis: good   MD Signature: Delice Lesch, MD, ABPMR

## 2021-06-20 NOTE — Progress Notes (Addendum)
[facility] Infectious Diseases Follow Up Note  Patient Identification: Patient Name: Parker Odonnell. MRN: [id number] Admit Date: 06/10/2021 12:19 PM Age: 87 y.o.Today's Date: 06/20/2021   Reason for Visit: Bilateral lower extremity weakness/rashes   Principal Problem:   Rash Active Problems:   Dyslipidemia   Essential hypertension   Atrial flutter (HCC)   Weakness   Chronic combined systolic (congestive) and diastolic (congestive) heart failure (HCC)   DNR (do not resuscitate)   Weakness of both lower extremities   Antibiotics:  Acyclovir 8/25-current  Lines/Tubes: PIV's, external urethral catheter  Interval Events: continues to be afebrile, no leukocytosis, awake alert and oriented, rashes noted in the right lower leg more than left upper thigh and lower sacral area ( stable from exam on 06/18/21). 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  Right lower lobe pulmonary nodule( 23m) and marked prostatomegaly 3.  Left hip arthroplasty    Recommendations Continue IV Acyclovir- plan for total 14 days. Unclear benefit of steroids.  His rashes seems to be stable and have crusted. OK to DC isolation precautions - however  defer to IP Adequate hydration with IVF and monitor  Cr Monitor for worsening of rashes and new neurological symptoms/signs PT and OT following  No updates on [facility] Panel ( was told it would not be back before 06/24/21). Will follow these results peripherally for completeness. Otherwise I will sign off for now.   Plan discussed with patient/Daughter/ID pharmacy and Primary   Rest of the management as per the primary team. Thank you for the consult. Please page with pertinent questions or concerns.  ______________________________________________________________________ Subjective patient seen and examined at the bedside. Sitting up in the chair. He says he has not been able to walk but was able to sit on a chir OOB with assistance. He feels poor. Denies fevers, chills and sweats. Denies nausea, vomiting, abdominal pain and diarrhea.   Vitals BP (!) 166/72 (BP Location: Left Arm)   Pulse 63   Temp 98.7 F (37.1 C) (Oral)   Resp 17   Wt 67 kg   SpO2 96%   BMI 22.46 kg/m     Physical Exam Constitutional:  Not in acute distress    Comments:   Cardiovascular:     Rate and Rhythm: Normal rate and regular rhythm.     Heart sounds:   Pulmonary:     Effort: Pulmonary effort is normal.     Comments:   Abdominal:     Palpations: Abdomen is soft.     Tenderness: Non tender, BS+  Musculoskeletal:        General: No swelling or tenderness.  Skin: No rashes in the face, back and chest, upper extremities     Comments: Rashes in the lower extremities appear stable and crusted   Neurological:     General: Bilateral lower extremity weakness ( 4/5 in Rt LE and 5/5 in LLE); RUE 4/5 and LUE 5/5   Psychiatric:        Mood and Affect: Mood normal. Calm and cooperative   Pertinent Microbiology Results for orders placed or performed during the hospital encounter of 06/10/21  Culture, blood (routine x 2)     Status: None   Collection Time: 06/10/21 12:55 PM   Specimen: BLOOD  Result Value Ref Range Status   Specimen Description BLOOD  BLOOD RIGHT FOREARM  Final   Special Requests   Final    BOTTLES DRAWN AEROBIC AND ANAEROBIC Blood Culture adequate volume   Culture   Final    NO GROWTH 5 DAYS Performed at [facility], [address], [place], [place] [address]    Report Status 06/15/2021 FINAL  Final  Culture, blood (routine x 2)     Status: None   Collection Time: 06/10/21  1:10 PM   Specimen: BLOOD  Result Value Ref Range Status   Specimen Description BLOOD RIGHT ANTECUBITAL  Final   Special Requests   Final    BOTTLES DRAWN AEROBIC AND ANAEROBIC Blood Culture adequate volume   Culture   Final    NO GROWTH 5 DAYS Performed at [facility], [place] [address], [place], [place] [address]    Report Status 06/15/2021 FINAL  Final  SARS CORONAVIRUS 2 (TAT 6-24 HRS) Nasopharyngeal Nasopharyngeal Swab     Status: None   Collection Time: 06/10/21  7:11 PM   Specimen: Nasopharyngeal Swab  Result Value Ref Range Status   SARS Coronavirus 2 NEGATIVE NEGATIVE Final    Comment: (NOTE) SARS-CoV-2 target nucleic acids are NOT DETECTED.  The SARS-CoV-2 RNA is generally detectable in upper and lower respiratory specimens during the acute phase of infection. Negative results do not preclude SARS-CoV-2 infection, do not rule out co-infections with other pathogens, and should not be used as the sole basis for treatment or other patient management decisions. Negative results must be combined with clinical observations, patient history, and epidemiological information. The expected result is Negative.  Fact Sheet for Patients: SugarRoll.be  Fact Sheet for Healthcare Providers: https://www.woods-mathews.com/  This test is not yet approved or cleared by the [place] FDA and  has been authorized for detection and/or diagnosis of SARS-CoV-2 by FDA under an Emergency Use Authorization (EUA). This EUA will remain  in effect (meaning this test can be used) for the duration  of the COVID-19 declaration under Se ction 564(b)(1) of the Act, 21 U.S.C. section 360bbb-3(b)(1), unless the authorization is terminated or revoked sooner.  Performed at [facility], [place] [address] [place], [place] [address]   Aerobic/Anaerobic Culture w Gram Stain (surgical/deep wound)     Status: None   Collection Time: 06/11/21 11:23 AM   Specimen: Tissue  Result Value Ref Range Status   Specimen Description TISSUE  Final   Special Requests SKIN EXCISIONAL BIOPSY  Final   Gram Stain   Final    RARE WBC PRESENT,BOTH PMN AND MONONUCLEAR NO ORGANISMS SEEN    Culture   Final    RARE DIPHTHEROIDS(CORYNEBACTERIUM SPECIES) Standardized susceptibility testing for this organism is not available. Performed at [facility], [place] [address] [place], [place] [address]  Report Status 06/17/2021 FINAL  Final  VZV PCR, CSF     Status: Abnormal   Collection Time: 06/12/21  1:35 PM   Specimen: Cerebrospinal Fluid  Result Value Ref Range Status   VZV PCR, CSF Positive (A) Negative Final    Comment: (NOTE) Varicella Zoster Virus DNA detected. Performed At: [facility] [place], [place] [id number] Rush Farmer MD [id number]   CSF culture w Stat Gram Stain     Status: None   Collection Time: 06/12/21  1:36 PM   Specimen: CSF; Cerebrospinal Fluid  Result Value Ref Range Status   Specimen Description CSF  Final   Special Requests NONE  Final   Gram Stain   Final    WBC PRESENT, PREDOMINANTLY MONONUCLEAR NO ORGANISMS SEEN CYTOSPIN SMEAR    Culture   Final    NO GROWTH 3 DAYS Performed at [facility], [place] [address], [place], [place] [address]    Report Status 06/15/2021 FINAL  Final  Culture, blood (routine x 2)     Status: None   Collection Time: 06/12/21  2:47 PM   Specimen: BLOOD RIGHT ARM  Result Value Ref Range Status   Specimen Description BLOOD RIGHT ARM  Final   Special Requests   Final    BOTTLES DRAWN AEROBIC AND  ANAEROBIC Blood Culture adequate volume   Culture   Final    NO GROWTH 5 DAYS Performed at [facility], [place] [address], [place], [place] [address]    Report Status 06/17/2021 FINAL  Final  Culture, blood (routine x 2)     Status: None   Collection Time: 06/12/21  2:55 PM   Specimen: BLOOD LEFT ARM  Result Value Ref Range Status   Specimen Description BLOOD LEFT ARM  Final   Special Requests   Final    BOTTLES DRAWN AEROBIC AND ANAEROBIC Blood Culture adequate volume   Culture   Final    NO GROWTH 5 DAYS Performed at [facility], [place] [address], [place], [place] [address]    Report Status 06/17/2021 FINAL  Final    Pertinent Lab. CBC Latest Ref Rng & Units 06/20/2021 06/19/2021 06/18/2021  WBC 4.0 - 10.5 K/uL 4.4 5.3 6.2  Hemoglobin 13.0 - 17.0 g/dL 9.9(L) 10.3(L) 10.0(L)  Hematocrit 39.0 - 52.0 % 28.9(L) 30.6(L) 29.6(L)  Platelets 150 - 400 K/uL 125(L) 125(L) 135(L)    CMP Latest Ref Rng & Units 06/20/2021 06/18/2021 06/16/2021  Glucose 70 - 99 mg/dL 102(H) 103(H) 115(H)  BUN 8 - 23 mg/dL '16 20 14  '$ Creatinine 0.61 - 1.24 mg/dL 1.11 1.33(H) 1.02  Sodium 135 - 145 mmol/L 133(L) 133(L) 131(L)  Potassium 3.5 - 5.1 mmol/L 4.1 4.6 4.1  Chloride 98 - 111 mmol/L 104 107 102  CO2 22 - 32 mmol/L 25 20(L) 24  Calcium 8.9 - 10.3 mg/dL 8.8(L) 8.9 9.1  Total Protein 6.5 - 8.1 g/dL 5.3(L) 4.9(L) 5.6(L)  Total Bilirubin 0.3 - 1.2 mg/dL 1.0 1.2 1.4(H)  Alkaline Phos 38 - 126 U/L 101 100 105  AST 15 - 41 U/L '18 26 16  '$ ALT 0 - 44 U/L '19 18 21    '$ Pertinent Imaging today Plain films and CT images have been personally visualized and interpreted; radiology reports have been reviewed. Decision making incorporated into the Impression / Recommendations.  I spent more than 35 minutes for this patient encounter including review of prior medical records, coordination of care  with greater than 50%  of time being face to face/counseling and discussing diagnostics/treatment plan with the  patient/family.  Electronically signed by:   Rosiland Oz, MD Infectious Disease Physician [facility] for Infectious Disease Pager: [phone]

## 2021-06-21 LAB — CBC WITH DIFFERENTIAL/PLATELET
Abs Immature Granulocytes: 0.02 10*3/uL (ref 0.00–0.07)
Basophils Absolute: 0 10*3/uL (ref 0.0–0.1)
Basophils Relative: 0 %
Eosinophils Absolute: 0.2 10*3/uL (ref 0.0–0.5)
Eosinophils Relative: 4 %
HCT: 28.6 % — ABNORMAL LOW (ref 39.0–52.0)
Hemoglobin: 9.7 g/dL — ABNORMAL LOW (ref 13.0–17.0)
Immature Granulocytes: 1 %
Lymphocytes Relative: 20 %
Lymphs Abs: 0.8 10*3/uL (ref 0.7–4.0)
MCH: 34 pg (ref 26.0–34.0)
MCHC: 33.9 g/dL (ref 30.0–36.0)
MCV: 100.4 fL — ABNORMAL HIGH (ref 80.0–100.0)
Monocytes Absolute: 0.4 10*3/uL (ref 0.1–1.0)
Monocytes Relative: 10 %
Neutro Abs: 2.8 10*3/uL (ref 1.7–7.7)
Neutrophils Relative %: 65 %
Platelets: 122 10*3/uL — ABNORMAL LOW (ref 150–400)
RBC: 2.85 MIL/uL — ABNORMAL LOW (ref 4.22–5.81)
RDW: 13.7 % (ref 11.5–15.5)
WBC: 4.3 10*3/uL (ref 4.0–10.5)
nRBC: 0 % (ref 0.0–0.2)

## 2021-06-21 LAB — COMPREHENSIVE METABOLIC PANEL
ALT: 19 U/L (ref 0–44)
AST: 20 U/L (ref 15–41)
Albumin: 2.6 g/dL — ABNORMAL LOW (ref 3.5–5.0)
Alkaline Phosphatase: 88 U/L (ref 38–126)
Anion gap: 5 (ref 5–15)
BUN: 15 mg/dL (ref 8–23)
CO2: 25 mmol/L (ref 22–32)
Calcium: 8.8 mg/dL — ABNORMAL LOW (ref 8.9–10.3)
Chloride: 105 mmol/L (ref 98–111)
Creatinine, Ser: 1.02 mg/dL (ref 0.61–1.24)
GFR, Estimated: >60 mL/min (ref >60)
Glucose, Bld: 109 mg/dL — ABNORMAL HIGH (ref 70–99)
Potassium: 4.1 mmol/L (ref 3.5–5.1)
Sodium: 135 mmol/L (ref 135–145)
Total Bilirubin: 1.2 mg/dL (ref 0.3–1.2)
Total Protein: 5.1 g/dL — ABNORMAL LOW (ref 6.5–8.1)

## 2021-06-21 MED ORDER — CHLORHEXIDINE GLUCONATE CLOTH 2 % EX PADS
6.0000 | MEDICATED_PAD | Freq: Every day | CUTANEOUS | Status: DC
  Administered 2021-06-22 – 2021-06-25 (×5): 6 via TOPICAL

## 2021-06-21 MED ORDER — HYDRALAZINE HCL 20 MG/ML IJ SOLN: 10.0000 mg | Freq: Four times a day (QID) | INTRAMUSCULAR | Status: DC | PRN

## 2021-06-21 MED ORDER — FUROSEMIDE 40 MG PO TABS
40.0000 mg | ORAL_TABLET | Freq: Once | ORAL | Status: AC
  Administered 2021-06-21: 40 mg via ORAL
  Filled 2021-06-21: qty 1

## 2021-06-21 NOTE — Progress Notes (Signed)
Foley catheter dc'd per order today around 1100. Patient voided into condom cath around 1500, but only about 370m total.  Bladder scan revealed over 8024murine retained. Patient had no urge or symptoms. Belly distended over bladder area. Order placed by Dr.Singh to place another foley catheter.   Foley placed at 1845 and [id number] yellow, clear urine returned and documented.

## 2021-06-21 NOTE — Progress Notes (Signed)
PROGRESS NOTE        PATIENT DETAILS Name: Parker Odonnell. Age: 87 y.o. Sex: male Date of Birth: [birth year] Admit Date: 06/10/2021 Admitting Physician Evalee Mutton Kristeen Mans, MD [id number], Dwyane Luo, MD  Brief Narrative: Patient is a 87 y.o. male persistent atrial fibrillation on Eliquis, HFrEF, HTN who presented to the hospital with right leg weakness and a purpuric right leg rash.  Given broad differential diagnosis (infectious/autoimmune/paraneoplastic/structural)-admitted to the [facility] service for further evaluation.  Work-up in progress-see below for results-ID/neurology following.  Significant events: 8/14>> presented to ED with right leg weakness/pain-fall-found to have rash-thought to have shingles-started on Valtrex and discharged  8/23>> admit to [facility] for right leg weakness/purpuric right leg rash eval/management 8/24>> punch biopsy by surgery 8/25>> LP-CSF w leukocytosis (lymphocytes) and elevated protein-Acyclovir started.  Significant radiological studies:  8/14>> CT head: No acute intracranial findings. 8/25>> MRI brain: No evidence of encephalitis/paraneoplastic disease/intracranial infection. 8/25>> MRA brain: No evidence of CNS vasculitis-no major stenosis. 8/25>> MRI thoracic/lumbar-spine: Compression fracture superior endplate of L1, acute fracture of left sacrum, mild edema within spinous process of T4/T5.  No MRI changes to suggest myelopathy. 8/27>> CT chest: 5 mm nodule in right major fissure-possibly intrapulmonary lymph node 8/27>> CT abdomen: No mass/adenopathy-Marked prostate enlargement-mild bilateral hydronephrosis.  Significant serological studies: 8/23>> TSH: Within normal limit 8/24>> RA factor: Negative 8/24>> ANA: Negative 8/24>> ds DNA antibody/anti-smooth antibody: Negative 8/24>> Sjogren's A/Sjogren's B antibody: Negative 8/24>> anti-smooth muscle antibody: Negative 8/24>> ANCA antibodies: Negative 8/24>> cardiolipin  antibodies: Negative 8/24>> Lyme antibody/PCR: Negative 8/24>> C3/C4/total complement: Normal limits 8/24>> C1 esterase inhibitor: Normal limit 8/24>> cryoglobulin: Pending 8/24>> SPEP/UPEP: No M spike noted.  No evidence for monoclonal antibodies 8/24>> Lupus anticoagulant: Not detected 8/25>>CSF Oligoclonal bands/IgG index:pending 8/27>>Vitamin B12: 998  Microbiology data: 8/23>> COVID PCR: Negative 8/23>> blood culture: No growth 8/24>> acute hepatitis panel: Negative 8/24>> HIV: Negative 8/25>> blood culture: No growth 8/25>> CSF cryptococcal antigen: Negative 8/25>> CSF culture: No growth for 3 days 8/25>> CSF VDRL: Non reactive 8/25>> CSF VZV PCR: Pending 8/25>> CSF HSV PCR: Negative 8/25>>CSF [facility] Encephalitis panel:Pending 8/26>>RPR:Non reactive  Pathology data: 8/24>> skin biopsy: Pending 8/25>> CSF cytology: negative for malignancy  Antimicrobial therapy: Acyclovir: 8/25>>  Procedures : 8/24>> punch biopsy performed by general surgery:   8/25>> lumbar puncture by neurology  Consults: Neuro, ID, general surgery  DVT Prophylaxis : apixaban (ELIQUIS) tablet 2.5 mg Start: 06/18/21 2200Eliquis  apixaban (ELIQUIS) tablet 2.5 mg   Subjective:  Patient in bed, appears comfortable, denies any headache, no fever, no chest pain or pressure, no shortness of breath , no abdominal pain. No new focal weakness.   Assessment/Plan:  Bilateral lower extremity weakness, right more than left/purpuric right lower extremity rash: Etiology unclear-differentials include infectious/vasculitis/paraneoplastic-extensive work-up in process.  Imaging studies so far unrevealing.  Autoimmune work-up negative so far.  Skin biopsy results still pending. No masses or lymphadenopathy is seen on CT chest/abdomen.  Given lymphocytic pleocytosis and elevated protein on CSF, HSV PCR negative.  VZV PCR is now +ve.  Continue acyclovir for stop date 06/25/21.    Viral Leg rash - all lesions are old and  fully crusted, DC isolation 06/21/21.  Hyponatremia: Due to dehydration resolved after IV fluids.  Persistent atrial fibrillation: Rate controlled-on Coreg-Eliquis.  HFrEF: Volume status is stable-continue to hold diuretics for now.  EF 35 to 40%  based on echocardiogram done in 2020.  History of CAD-s/p remote PCI: No anginal symptoms.  Hyperlipidemia: Continue statin  Essential hypertension: BP stable-stopped ARB due to AKI, Coreg and Norvasc combination.  Large prostate with mild hydronephrosis seen on CT abdomen: Renal function stable-started on low-dose Flomax, if Bladder scan > 300 cc x 2 - Foley with Urology follow up.  Will give a trial of Foley removal on 06/21/2020 monitor.  L1 superior endplate compression fracture/mild edema within spinous process of T4/T5, Sacral insufficiency fracture : Seen incidentally on MRI imaging.  Supportive care, no pain.  5 mm right lower lobe pulmonary nodule versus intrapulmonary lymph node: Repeat noncontrast CT chest in 12 months to reassess.  Mild AKI - IVF, as on Acylovir, continue Foley. DC'd ARB.   Diet: Diet Order             Diet regular Room service appropriate? No; Fluid consistency: Thin  Diet effective now                    Code Status:  DNR  Family Communication:  [id number] called 06/17/21, 06/18/21 - detailed updates, 06/19/21 -message left at 12:27 PM.  Disposition Plan: CIR is recommended by PT and OT.  Status is: Inpatient  The patient will require care spanning > 2 midnights and should be moved to inpatient because: Inpatient level of care appropriate due to severity of illness  Dispo: The patient is from: Home              Anticipated d/c is to: SNF              Patient currently is not medically stable to d/c.   Difficult to place patient No   Barriers to Discharge: Right lower extremity weakness-rash of unknown etiology-work-up in progress  Antimicrobial agents: Anti-infectives (From  admission, onward)    Start     Dose/Rate Route Frequency Ordered Stop   06/16/21 2000  acyclovir (ZOVIRAX) 660 mg in dextrose 5 % 100 mL IVPB  Status:  Discontinued        660 mg 113.2 mL/hr over 60 Minutes Intravenous Every 12 hours 06/16/21 0930 06/16/21 1011   06/16/21 2000  acyclovir (ZOVIRAX) 660 mg in dextrose 5 % 100 mL IVPB        660 mg 113.2 mL/hr over 60 Minutes Intravenous Every 12 hours 06/16/21 1021 06/25/21 2359   06/12/21 1815  acyclovir (ZOVIRAX) 660 mg in dextrose 5 % 100 mL IVPB  Status:  Discontinued        10 mg/kg  66.2 kg 113.2 mL/hr over 60 Minutes Intravenous Every 12 hours 06/12/21 1718 06/16/21 0930       MEDICATIONS:  Scheduled Meds:  amLODipine  10 mg Oral Daily   apixaban  2.5 mg Oral BID   atorvastatin  40 mg Oral QHS   carvedilol  6.25 mg Oral BID WC   Chlorhexidine Gluconate Cloth  6 each Topical Daily   docusate sodium  100 mg Oral BID   feeding supplement  237 mL Oral BID BM   furosemide  40 mg Oral Once   multivitamin with minerals  1 tablet Oral Daily   mupirocin cream   Topical BID   pregabalin  50 mg Oral BID   tamsulosin  0.4 mg Oral Daily   Continuous Infusions:  sodium chloride 50 mL/hr at 06/19/21 1324   acyclovir 660 mg (06/21/21 1005)   PRN Meds:.acetaminophen **OR** [DISCONTINUED] acetaminophen, bisacodyl, hydrALAZINE, lip balm, liver  oil-zinc oxide, [DISCONTINUED] ondansetron **OR** ondansetron (ZOFRAN) IV, polyethylene glycol, polyvinyl alcohol, traMADol   PHYSICAL EXAM: Vital signs: Vitals:   06/20/21 2000 06/21/21 0003 06/21/21 0206 06/21/21 0342  BP: (!) 127/58 138/65  (!) 148/76  Pulse: 60     Resp: 15   19  Temp: 98.1 F (36.7 C) 98 F (36.7 C)  98.4 F (36.9 C)  TempSrc: Oral Oral  Oral  SpO2: 96%  98%   Weight:       Filed Weights   06/14/21 0407  Weight: 67 kg   Body mass index is 22.46 kg/m.   Exam:  Awake Alert,  [place].AT,PERRAL Supple Neck,No JVD, No cervical lymphadenopathy appriciated.   Symmetrical Chest wall movement, Good air movement bilaterally, CTAB RRR,No Gallops, Rubs or new Murmurs, No Parasternal Heave +ve B.Sounds, Abd Soft, No tenderness, No organomegaly appriciated, No rebound - guarding or rigidity.  Weakness noted bilateral lower extremities.  Right greater than left. Purpuric rash on right lower extremity >> Left, all crusted    I have personally reviewed following labs and imaging studies  LABORATORY DATA:  Recent Labs  Lab 06/16/21 0215 06/18/21 0140 06/19/21 0320 06/20/21 0100 06/21/21 0043  WBC 6.0 6.2 5.3 4.4 4.3  HGB 10.5* 10.0* 10.3* 9.9* 9.7*  HCT 30.6* 29.6* 30.6* 28.9* 28.6*  PLT 137* 135* 125* 125* 122*  MCV 99.0 100.3* 101.0* 99.3 100.4*  MCH 34.0 33.9 34.0 34.0 34.0  MCHC 34.3 33.8 33.7 34.3 33.9  RDW 13.5 13.7 13.7 13.6 13.7  LYMPHSABS  --  0.9 1.1 1.0 0.8  MONOABS  --  0.5 0.4 0.4 0.4  EOSABS  --  0.2 0.2 0.1 0.2  BASOSABS  --  0.0 0.0 0.0 0.0    Recent Labs  Lab 06/15/21 0152 06/16/21 0215 06/18/21 0140 06/19/21 0320 06/20/21 0100 06/21/21 0043  NA 131* 131* 133*  --  133* 135  K 4.8 4.1 4.6  --  4.1 4.1  CL 102 102 107  --  104 105  CO2 24 24 20*  --  25 25  GLUCOSE 122* 115* 103*  --  102* 109*  BUN '14 14 20  '$ --  16 15  CREATININE 0.98 1.02 1.33*  --  1.11 1.02  CALCIUM 9.3 9.1 8.9  --  8.8* 8.8*  AST '16 16 26  '$ --  18 20  ALT '21 21 18  '$ --  19 19  ALKPHOS 110 105 100  --  101 88  BILITOT 1.9* 1.4* 1.2  --  1.0 1.2  ALBUMIN 2.9* 2.9* 2.6*  --  2.7* 2.6*  MG  --   --  1.9 1.7  --   --   BNP  --   --  106.3* 220.2*  --   --        Urine analysis:    Component Value Date/Time   COLORURINE YELLOW 06/17/2021 0758   APPEARANCEUR CLEAR 06/17/2021 0758   LABSPEC 1.006 06/17/2021 0758   PHURINE 7.0 06/17/2021 0758   GLUCOSEU NEGATIVE 06/17/2021 0758   HGBUR NEGATIVE 06/17/2021 0758   BILIRUBINUR NEGATIVE 06/17/2021 0758   KETONESUR NEGATIVE 06/17/2021 0758   PROTEINUR NEGATIVE 06/17/2021 0758    UROBILINOGEN 0.2 06/13/2012 1027   NITRITE NEGATIVE 06/17/2021 0758   LEUKOCYTESUR NEGATIVE 06/17/2021 0758    RADIOLOGY STUDIES/RESULTS:   No results found.   LOS: 10 days   Signature  Lala Lund M.D on 06/21/2021 at 10:20 AM   -  To page go to [email or web address]

## 2021-06-22 LAB — COMPREHENSIVE METABOLIC PANEL
ALT: 21 U/L (ref 0–44)
AST: 20 U/L (ref 15–41)
Albumin: 2.7 g/dL — ABNORMAL LOW (ref 3.5–5.0)
Alkaline Phosphatase: 95 U/L (ref 38–126)
Anion gap: 4 — ABNORMAL LOW (ref 5–15)
BUN: 14 mg/dL (ref 8–23)
CO2: 26 mmol/L (ref 22–32)
Calcium: 8.8 mg/dL — ABNORMAL LOW (ref 8.9–10.3)
Chloride: 105 mmol/L (ref 98–111)
Creatinine, Ser: 0.98 mg/dL (ref 0.61–1.24)
GFR, Estimated: >60 mL/min (ref >60)
Glucose, Bld: 147 mg/dL — ABNORMAL HIGH (ref 70–99)
Potassium: 3.9 mmol/L (ref 3.5–5.1)
Sodium: 135 mmol/L (ref 135–145)
Total Bilirubin: 1.1 mg/dL (ref 0.3–1.2)
Total Protein: 5.1 g/dL — ABNORMAL LOW (ref 6.5–8.1)

## 2021-06-22 NOTE — Progress Notes (Signed)
PROGRESS NOTE        PATIENT DETAILS Name: Parker Odonnell. Age: 87 y.o. Sex: male Date of Birth: Feb 15, 1932 Admit Date: 06/10/2021 Admitting Physician Evalee Mutton Kristeen Mans, MD PF:[id number], Dwyane Luo, MD  Brief Narrative: Patient is a 87 y.o. male persistent atrial fibrillation on Eliquis, HFrEF, HTN who presented to the hospital with right leg weakness and a purpuric right leg rash.  Given broad differential diagnosis (infectious/autoimmune/paraneoplastic/structural)-admitted to the [facility] service for further evaluation.  Work-up in progress-see below for results-ID/neurology following.  Significant events: 8/14>> presented to ED with right leg weakness/pain-fall-found to have rash-thought to have shingles-started on Valtrex and discharged  8/23>> admit to [facility] for right leg weakness/purpuric right leg rash eval/management 8/24>> punch biopsy by surgery 8/25>> LP-CSF w leukocytosis (lymphocytes) and elevated protein-Acyclovir started.  Significant radiological studies:  8/14>> CT head: No acute intracranial findings. 8/25>> MRI brain: No evidence of encephalitis/paraneoplastic disease/intracranial infection. 8/25>> MRA brain: No evidence of CNS vasculitis-no major stenosis. 8/25>> MRI thoracic/lumbar-spine: Compression fracture superior endplate of L1, acute fracture of left sacrum, mild edema within spinous process of T4/T5.  No MRI changes to suggest myelopathy. 8/27>> CT chest: 5 mm nodule in right major fissure-possibly intrapulmonary lymph node 8/27>> CT abdomen: No mass/adenopathy-Marked prostate enlargement-mild bilateral hydronephrosis.  Significant serological studies: 8/23>> TSH: Within normal limit 8/24>> RA factor: Negative 8/24>> ANA: Negative 8/24>> ds DNA antibody/anti-smooth antibody: Negative 8/24>> Sjogren's A/Sjogren's B antibody: Negative 8/24>> anti-smooth muscle antibody: Negative 8/24>> ANCA antibodies: Negative 8/24>> cardiolipin  antibodies: Negative 8/24>> Lyme antibody/PCR: Negative 8/24>> C3/C4/total complement: Normal limits 8/24>> C1 esterase inhibitor: Normal limit 8/24>> cryoglobulin: Pending 8/24>> SPEP/UPEP: No M spike noted.  No evidence for monoclonal antibodies 8/24>> Lupus anticoagulant: Not detected 8/25>>CSF Oligoclonal bands/IgG index:pending 8/27>>Vitamin B12: 998  Microbiology data: 8/23>> COVID PCR: Negative 8/23>> blood culture: No growth 8/24>> acute hepatitis panel: Negative 8/24>> HIV: Negative 8/25>> blood culture: No growth 8/25>> CSF cryptococcal antigen: Negative 8/25>> CSF culture: No growth for 3 days 8/25>> CSF VDRL: Non reactive 8/25>> CSF VZV PCR: Pending 8/25>> CSF HSV PCR: Negative 8/25>>CSF ARUP Encephalitis panel:Pending 8/26>>RPR:Non reactive  Pathology data: 8/24>> skin biopsy: Pending 8/25>> CSF cytology: negative for malignancy  Antimicrobial therapy: Acyclovir: 8/25>>  Procedures : 8/24>> punch biopsy performed by general surgery:   8/25>> lumbar puncture by neurology  Consults: Neuro, ID, general surgery  DVT Prophylaxis : apixaban (ELIQUIS) tablet 2.5 mg Start: 06/18/21 2200Eliquis  apixaban (ELIQUIS) tablet 2.5 mg   Subjective:  Patient in bed, appears comfortable, denies any headache, no fever, no chest pain or pressure, no shortness of breath , no abdominal pain. No new focal weakness.    Assessment/Plan:  Bilateral lower extremity weakness, right more than left/purpuric right lower extremity rash: Etiology unclear-differentials include infectious/vasculitis/paraneoplastic-extensive work-up in process.  Imaging studies so far unrevealing.  Autoimmune work-up negative so far.  Skin biopsy results still pending. No masses or lymphadenopathy is seen on CT chest/abdomen.  Given lymphocytic pleocytosis and elevated protein on CSF, HSV PCR negative.  VZV PCR is now +ve.  Continue acyclovir for stop date 06/25/21.    Viral Leg rash - all lesions are old  and fully crusted, DC'd isolation 06/21/21.  Hyponatremia: Due to dehydration resolved after IV fluids.  Persistent atrial fibrillation: Rate controlled-on Coreg-Eliquis.  HFrEF: Volume status is stable-continue to hold diuretics for now.  EF 35 to  40% based on echocardiogram done in 2020.  History of CAD-s/p remote PCI: No anginal symptoms.  Hyperlipidemia: Continue statin  Essential hypertension: BP stable-stopped ARB due to AKI, Coreg and Norvasc combination.  Large prostate with mild hydronephrosis seen on CT abdomen: Renal function stable-started on Flomax +  Foley with outpt Urology follow up.  Failed Foley removal trial on 06/21/2021.  L1 superior endplate compression fracture/mild edema within spinous process of T4/T5, Sacral insufficiency fracture : Seen incidentally on MRI imaging.  Supportive care, no pain.  5 mm right lower lobe pulmonary nodule versus intrapulmonary lymph node: Repeat noncontrast CT chest in 12 months to reassess.  Mild AKI - IVF, as on Acylovir, continue Foley. DC'd ARB.   Diet: Diet Order             Diet regular Room service appropriate? No; Fluid consistency: Thin  Diet effective now                    Code Status:  DNR  Family Communication:  [id number] called 06/17/21, 06/18/21 - detailed updates, 06/19/21 -message left at 12:27 PM, bedside 06/22/21  Disposition Plan: CIR is recommended by PT and OT.  Status is: Inpatient  The patient will require care spanning > 2 midnights and should be moved to inpatient because: Inpatient level of care appropriate due to severity of illness  Dispo: The patient is from: Home              Anticipated d/c is to: SNF              Patient currently is not medically stable to d/c.   Difficult to place patient No   Barriers to Discharge: Right lower extremity weakness-rash of unknown etiology-work-up in progress  Antimicrobial agents: Anti-infectives (From admission, onward)    Start      Dose/Rate Route Frequency Ordered Stop   06/16/21 2000  acyclovir (ZOVIRAX) 660 mg in dextrose 5 % 100 mL IVPB  Status:  Discontinued        660 mg 113.2 mL/hr over 60 Minutes Intravenous Every 12 hours 06/16/21 0930 06/16/21 1011   06/16/21 2000  acyclovir (ZOVIRAX) 660 mg in dextrose 5 % 100 mL IVPB        660 mg 113.2 mL/hr over 60 Minutes Intravenous Every 12 hours 06/16/21 1021 06/25/21 2359   06/12/21 1815  acyclovir (ZOVIRAX) 660 mg in dextrose 5 % 100 mL IVPB  Status:  Discontinued        10 mg/kg  66.2 kg 113.2 mL/hr over 60 Minutes Intravenous Every 12 hours 06/12/21 1718 06/16/21 0930       MEDICATIONS:  Scheduled Meds:  amLODipine  10 mg Oral Daily   apixaban  2.5 mg Oral BID   atorvastatin  40 mg Oral QHS   carvedilol  6.25 mg Oral BID WC   Chlorhexidine Gluconate Cloth  6 each Topical Daily   docusate sodium  100 mg Oral BID   feeding supplement  237 mL Oral BID BM   multivitamin with minerals  1 tablet Oral Daily   mupirocin cream   Topical BID   pregabalin  50 mg Oral BID   tamsulosin  0.4 mg Oral Daily   Continuous Infusions:  sodium chloride 30 mL/hr at 06/22/21 0700   acyclovir 660 mg (06/22/21 0942)   PRN Meds:.acetaminophen **OR** [DISCONTINUED] acetaminophen, bisacodyl, hydrALAZINE, lip balm, liver oil-zinc oxide, [DISCONTINUED] ondansetron **OR** ondansetron (ZOFRAN) IV, polyethylene glycol, polyvinyl alcohol, traMADol   PHYSICAL  EXAM: Vital signs: Vitals:   06/21/21 2243 06/21/21 2344 06/22/21 0344 06/22/21 0733  BP:  (!) 134/56 (!) 146/83 (!) 155/75  Pulse: 67 60 71 73  Resp: (!) '22 15 20 '$ (!) 21  Temp:  97.9 F (36.6 C) 98.1 F (36.7 C) (!) 97.2 F (36.2 C)  TempSrc:  Axillary Axillary Oral  SpO2: 96% 100% 98% 98%  Weight:      Height:       Filed Weights   06/14/21 0407  Weight: 67 kg   Body mass index is 22.46 kg/m.   Exam:  Awake Alert, No new F.N deficits,   [place].AT,PERRAL Supple Neck,No JVD, No cervical lymphadenopathy  appriciated.  Symmetrical Chest wall movement, Good air movement bilaterally, CTAB RRR,No Gallops, Rubs or new Murmurs, No Parasternal Heave +ve B.Sounds, Abd Soft, No tenderness, No organomegaly appriciated, No rebound - guarding or rigidity.  Weakness noted bilateral lower extremities.  Right greater than left. Purpuric rash on right lower extremity >> Left, all crusted    I have personally reviewed following labs and imaging studies  LABORATORY DATA:  Recent Labs  Lab 06/16/21 0215 06/18/21 0140 06/19/21 0320 06/20/21 0100 06/21/21 0043  WBC 6.0 6.2 5.3 4.4 4.3  HGB 10.5* 10.0* 10.3* 9.9* 9.7*  HCT 30.6* 29.6* 30.6* 28.9* 28.6*  PLT 137* 135* 125* 125* 122*  MCV 99.0 100.3* 101.0* 99.3 100.4*  MCH 34.0 33.9 34.0 34.0 34.0  MCHC 34.3 33.8 33.7 34.3 33.9  RDW 13.5 13.7 13.7 13.6 13.7  LYMPHSABS  --  0.9 1.1 1.0 0.8  MONOABS  --  0.5 0.4 0.4 0.4  EOSABS  --  0.2 0.2 0.1 0.2  BASOSABS  --  0.0 0.0 0.0 0.0    Recent Labs  Lab 06/16/21 0215 06/18/21 0140 06/19/21 0320 06/20/21 0100 06/21/21 0043 06/22/21 0012  NA 131* 133*  --  133* 135 135  K 4.1 4.6  --  4.1 4.1 3.9  CL 102 107  --  104 105 105  CO2 24 20*  --  '25 25 26  '$ GLUCOSE 115* 103*  --  102* 109* 147*  BUN 14 20  --  '16 15 14  '$ CREATININE 1.02 1.33*  --  1.11 1.02 0.98  CALCIUM 9.1 8.9  --  8.8* 8.8* 8.8*  AST 16 26  --  '18 20 20  '$ ALT 21 18  --  '19 19 21  '$ ALKPHOS 105 100  --  101 88 95  BILITOT 1.4* 1.2  --  1.0 1.2 1.1  ALBUMIN 2.9* 2.6*  --  2.7* 2.6* 2.7*  MG  --  1.9 1.7  --   --   --   BNP  --  106.3* 220.2*  --   --   --        Urine analysis:    Component Value Date/Time   COLORURINE YELLOW 06/17/2021 0758   APPEARANCEUR CLEAR 06/17/2021 0758   LABSPEC 1.006 06/17/2021 0758   PHURINE 7.0 06/17/2021 0758   GLUCOSEU NEGATIVE 06/17/2021 0758   HGBUR NEGATIVE 06/17/2021 0758   BILIRUBINUR NEGATIVE 06/17/2021 0758   KETONESUR NEGATIVE 06/17/2021 0758   PROTEINUR NEGATIVE 06/17/2021 0758    UROBILINOGEN 0.2 06/13/2012 1027   NITRITE NEGATIVE 06/17/2021 0758   LEUKOCYTESUR NEGATIVE 06/17/2021 0758    RADIOLOGY STUDIES/RESULTS:   No results found.   LOS: 11 days   Signature  Lala Lund M.D on 06/22/2021 at 10:49 AM   -  To page go to [email or web address]

## 2021-06-23 LAB — URINALYSIS, ROUTINE W REFLEX MICROSCOPIC
Bilirubin Urine: NEGATIVE
Glucose, UA: NEGATIVE mg/dL
Ketones, ur: NEGATIVE mg/dL
Nitrite: POSITIVE — AB
Protein, ur: 30 mg/dL — AB
Specific Gravity, Urine: 1.01 (ref 1.005–1.030)
WBC, UA: >50 WBC/hpf — ABNORMAL HIGH (ref 0–5)
pH: 7 (ref 5.0–8.0)

## 2021-06-23 LAB — COMPREHENSIVE METABOLIC PANEL
ALT: 21 U/L (ref 0–44)
AST: 20 U/L (ref 15–41)
Albumin: 2.7 g/dL — ABNORMAL LOW (ref 3.5–5.0)
Alkaline Phosphatase: 99 U/L (ref 38–126)
Anion gap: 4 — ABNORMAL LOW (ref 5–15)
BUN: 13 mg/dL (ref 8–23)
CO2: 25 mmol/L (ref 22–32)
Calcium: 8.7 mg/dL — ABNORMAL LOW (ref 8.9–10.3)
Chloride: 102 mmol/L (ref 98–111)
Creatinine, Ser: 0.92 mg/dL (ref 0.61–1.24)
GFR, Estimated: >60 mL/min (ref >60)
Glucose, Bld: 113 mg/dL — ABNORMAL HIGH (ref 70–99)
Potassium: 3.9 mmol/L (ref 3.5–5.1)
Sodium: 131 mmol/L — ABNORMAL LOW (ref 135–145)
Total Bilirubin: 0.8 mg/dL (ref 0.3–1.2)
Total Protein: 5.3 g/dL — ABNORMAL LOW (ref 6.5–8.1)

## 2021-06-23 LAB — MISC LABCORP TEST (SEND OUT): Labcorp test code: 247315

## 2021-06-23 LAB — URIC ACID: Uric Acid, Serum: 3.9 mg/dL (ref 3.7–8.6)

## 2021-06-23 LAB — SODIUM, URINE, RANDOM: Sodium, Ur: 74 mmol/L

## 2021-06-23 LAB — OSMOLALITY, URINE: Osmolality, Ur: 350 mOsm/kg (ref 300–900)

## 2021-06-23 LAB — OSMOLALITY: Osmolality: 279 mOsm/kg (ref 275–295)

## 2021-06-23 MED ORDER — FUROSEMIDE 40 MG PO TABS
40.0000 mg | ORAL_TABLET | Freq: Once | ORAL | Status: AC
  Administered 2021-06-23: 40 mg via ORAL
  Filled 2021-06-23: qty 1

## 2021-06-23 MED ORDER — SODIUM CHLORIDE 1 G PO TABS
1.0000 g | ORAL_TABLET | Freq: Two times a day (BID) | ORAL | Status: AC
  Administered 2021-06-23 (×2): 1 g via ORAL
  Filled 2021-06-23 (×2): qty 1

## 2021-06-23 MED ORDER — FUROSEMIDE 40 MG PO TABS: 40.0000 mg | ORAL_TABLET | Freq: Once | ORAL | Status: DC

## 2021-06-23 NOTE — Progress Notes (Signed)
Inpatient Rehabilitation Admissions Coordinator   We await determination form [employer] for a potential Cir admit.  Danne Baxter, RN, MSN Rehab Admissions Coordinator [phone] 06/23/2021 12:15 PM

## 2021-06-23 NOTE — Progress Notes (Signed)
PROGRESS NOTE        PATIENT DETAILS Name: Parker Odonnell. Age: 87 y.o. Sex: male Date of Birth: [birth year] Admit Date: 06/10/2021 Admitting Physician Evalee Mutton Kristeen Mans, MD PF:[id number], Dwyane Luo, MD  Brief Narrative: Patient is a 87 y.o. male persistent atrial fibrillation on Eliquis, HFrEF, HTN who presented to the hospital with right leg weakness and a purpuric right leg rash.  Given broad differential diagnosis (infectious/autoimmune/paraneoplastic/structural)-admitted to the [facility] service for further evaluation.  Work-up in progress-see below for results-ID/neurology following.  Significant events: 8/14>> presented to ED with right leg weakness/pain-fall-found to have rash-thought to have shingles-started on Valtrex and discharged  8/23>> admit to [facility] for right leg weakness/purpuric right leg rash eval/management 8/24>> punch biopsy by surgery 8/25>> LP-CSF w leukocytosis (lymphocytes) and elevated protein-Acyclovir started.  Significant radiological studies:  8/14>> CT head: No acute intracranial findings. 8/25>> MRI brain: No evidence of encephalitis/paraneoplastic disease/intracranial infection. 8/25>> MRA brain: No evidence of CNS vasculitis-no major stenosis. 8/25>> MRI thoracic/lumbar-spine: Compression fracture superior endplate of L1, acute fracture of left sacrum, mild edema within spinous process of T4/T5.  No MRI changes to suggest myelopathy. 8/27>> CT chest: 5 mm nodule in right major fissure-possibly intrapulmonary lymph node 8/27>> CT abdomen: No mass/adenopathy-Marked prostate enlargement-mild bilateral hydronephrosis.  Significant serological studies: 8/23>> TSH: Within normal limit 8/24>> RA factor: Negative 8/24>> ANA: Negative 8/24>> ds DNA antibody/anti-smooth antibody: Negative 8/24>> Sjogren's A/Sjogren's B antibody: Negative 8/24>> anti-smooth muscle antibody: Negative 8/24>> ANCA antibodies: Negative 8/24>> cardiolipin  antibodies: Negative 8/24>> Lyme antibody/PCR: Negative 8/24>> C3/C4/total complement: Normal limits 8/24>> C1 esterase inhibitor: Normal limit 8/24>> cryoglobulin: Pending 8/24>> SPEP/UPEP: No M spike noted.  No evidence for monoclonal antibodies 8/24>> Lupus anticoagulant: Not detected 8/25>>CSF Oligoclonal bands/IgG index:pending 8/27>>Vitamin B12: 998  Microbiology data: 8/23>> COVID PCR: Negative 8/23>> blood culture: No growth 8/24>> acute hepatitis panel: Negative 8/24>> HIV: Negative 8/25>> blood culture: No growth 8/25>> CSF cryptococcal antigen: Negative 8/25>> CSF culture: No growth for 3 days 8/25>> CSF VDRL: Non reactive 8/25>> CSF VZV PCR: Pending 8/25>> CSF HSV PCR: Negative 8/25>>CSF ARUP Encephalitis panel:Pending 8/26>>RPR:Non reactive  Pathology data: 8/24>> skin biopsy: Pending 8/25>> CSF cytology: negative for malignancy  Antimicrobial therapy: Acyclovir: 8/25>>  Procedures : 8/24>> punch biopsy performed by general surgery:   8/25>> lumbar puncture by neurology  Consults: Neuro, ID, general surgery  DVT Prophylaxis : apixaban (ELIQUIS) tablet 2.5 mg Start: 06/18/21 2200Eliquis  apixaban (ELIQUIS) tablet 2.5 mg   Subjective:  Patient in bed, appears comfortable, denies any headache, no fever, no chest pain or pressure, no shortness of breath , no abdominal pain. No new focal weakness.   Assessment/Plan:  Bilateral lower extremity weakness, right more than left/purpuric right lower extremity rash: Etiology unclear-differentials include infectious/vasculitis/paraneoplastic-extensive work-up in process.  Imaging studies so far unrevealing.  Autoimmune work-up negative so far.  Skin biopsy results still pending. No masses or lymphadenopathy is seen on CT chest/abdomen.  Given lymphocytic pleocytosis and elevated protein on CSF, HSV PCR negative.  VZV PCR is now +ve.  Continue acyclovir for stop date 06/25/21.    Viral Leg rash - all lesions are old and  fully crusted, DC'd isolation 06/21/21.  Hyponatremia: Due to dehydration resolved after IV fluids.  Persistent atrial fibrillation: Rate controlled-on Coreg-Eliquis.  HFrEF: Volume status is stable-continue to hold diuretics for now.  EF 35 to 40%  based on echocardiogram done in 2020.  History of CAD-s/p remote PCI: No anginal symptoms.  Hyperlipidemia: Continue statin  Essential hypertension: BP stable-stopped ARB due to AKI, Coreg and Norvasc combination.  Large prostate with mild hydronephrosis seen on CT abdomen: Renal function stable-started on Flomax +  Foley with outpt Urology follow up.  Failed Foley removal trial on 06/21/2021.  L1 superior endplate compression fracture/mild edema within spinous process of T4/T5, Sacral insufficiency fracture : Seen incidentally on MRI imaging.  Supportive care, no pain.  5 mm right lower lobe pulmonary nodule versus intrapulmonary lymph node: Repeat noncontrast CT chest in 12 months to reassess.  Mild AKI - IVF, as on Acylovir, continue Foley. DC'd ARB.   Diet: Diet Order             Diet regular Room service appropriate? No; Fluid consistency: Thin  Diet effective now                    Code Status:  DNR  Family Communication:  [id number] called 06/17/21, 06/18/21 - detailed updates, 06/19/21 -message left at 12:27 PM, bedside 06/22/21  Disposition Plan: CIR is recommended by PT and OT.  Status is: Inpatient  The patient will require care spanning > 2 midnights and should be moved to inpatient because: Inpatient level of care appropriate due to severity of illness  Dispo: The patient is from: Home              Anticipated d/c is to: SNF              Patient currently is not medically stable to d/c.   Difficult to place patient No   Barriers to Discharge: Right lower extremity weakness-rash of unknown etiology-work-up in progress  Antimicrobial agents: Anti-infectives (From admission, onward)    Start      Dose/Rate Route Frequency Ordered Stop   06/16/21 2000  acyclovir (ZOVIRAX) 660 mg in dextrose 5 % 100 mL IVPB  Status:  Discontinued        660 mg 113.2 mL/hr over 60 Minutes Intravenous Every 12 hours 06/16/21 0930 06/16/21 1011   06/16/21 2000  acyclovir (ZOVIRAX) 660 mg in dextrose 5 % 100 mL IVPB        660 mg 113.2 mL/hr over 60 Minutes Intravenous Every 12 hours 06/16/21 1021 06/25/21 2359   06/12/21 1815  acyclovir (ZOVIRAX) 660 mg in dextrose 5 % 100 mL IVPB  Status:  Discontinued        10 mg/kg  66.2 kg 113.2 mL/hr over 60 Minutes Intravenous Every 12 hours 06/12/21 1718 06/16/21 0930       MEDICATIONS:  Scheduled Meds:  amLODipine  10 mg Oral Daily   apixaban  2.5 mg Oral BID   atorvastatin  40 mg Oral QHS   carvedilol  6.25 mg Oral BID WC   Chlorhexidine Gluconate Cloth  6 each Topical Daily   docusate sodium  100 mg Oral BID   feeding supplement  237 mL Oral BID BM   multivitamin with minerals  1 tablet Oral Daily   mupirocin cream   Topical BID   pregabalin  50 mg Oral BID   sodium chloride  1 g Oral BID WC   tamsulosin  0.4 mg Oral Daily   Continuous Infusions:  sodium chloride 30 mL/hr at 06/22/21 1512   acyclovir 660 mg (06/22/21 2100)   PRN Meds:.acetaminophen **OR** [DISCONTINUED] acetaminophen, bisacodyl, hydrALAZINE, lip balm, liver oil-zinc oxide, [DISCONTINUED] ondansetron **OR** ondansetron (ZOFRAN)  IV, polyethylene glycol, polyvinyl alcohol, traMADol   PHYSICAL EXAM: Vital signs: Vitals:   06/22/21 2000 06/23/21 0000 06/23/21 0400 06/23/21 0750  BP: 138/81 (!) 148/60 (!) 142/56 (!) 145/74  Pulse: 67 60 63 71  Resp: '20 15 15 20  '$ Temp: 98.2 F (36.8 C) 98.1 F (36.7 C) 98.2 F (36.8 C) 99 F (37.2 C)  TempSrc: Oral Oral Oral Oral  SpO2: 95% 95% 95% 95%  Weight:      Height:       Filed Weights   06/14/21 0407  Weight: 67 kg   Body mass index is 22.46 kg/m.   Exam:  Awake Alert, No new F.N deficits,   [place].AT,PERRAL Supple Neck,No  JVD, No cervical lymphadenopathy appriciated.  Symmetrical Chest wall movement, Good air movement bilaterally, CTAB RRR,No Gallops, Rubs or new Murmurs, No Parasternal Heave +ve B.Sounds, Abd Soft, No tenderness, No organomegaly appriciated, No rebound - guarding or rigidity.  Weakness noted bilateral lower extremities.  Right greater than left. Purpuric rash on right lower extremity >> Left, all crusted    I have personally reviewed following labs and imaging studies  LABORATORY DATA:  Recent Labs  Lab 06/18/21 0140 06/19/21 0320 06/20/21 0100 06/21/21 0043  WBC 6.2 5.3 4.4 4.3  HGB 10.0* 10.3* 9.9* 9.7*  HCT 29.6* 30.6* 28.9* 28.6*  PLT 135* 125* 125* 122*  MCV 100.3* 101.0* 99.3 100.4*  MCH 33.9 34.0 34.0 34.0  MCHC 33.8 33.7 34.3 33.9  RDW 13.7 13.7 13.6 13.7  LYMPHSABS 0.9 1.1 1.0 0.8  MONOABS 0.5 0.4 0.4 0.4  EOSABS 0.2 0.2 0.1 0.2  BASOSABS 0.0 0.0 0.0 0.0    Recent Labs  Lab 06/18/21 0140 06/19/21 0320 06/20/21 0100 06/21/21 0043 06/22/21 0012 06/23/21 0045  NA 133*  --  133* 135 135 131*  K 4.6  --  4.1 4.1 3.9 3.9  CL 107  --  104 105 105 102  CO2 20*  --  '25 25 26 25  '$ GLUCOSE 103*  --  102* 109* 147* 113*  BUN 20  --  '16 15 14 13  '$ CREATININE 1.33*  --  1.11 1.02 0.98 0.92  CALCIUM 8.9  --  8.8* 8.8* 8.8* 8.7*  AST 26  --  '18 20 20 20  '$ ALT 18  --  '19 19 21 21  '$ ALKPHOS 100  --  101 88 95 99  BILITOT 1.2  --  1.0 1.2 1.1 0.8  ALBUMIN 2.6*  --  2.7* 2.6* 2.7* 2.7*  MG 1.9 1.7  --   --   --   --   BNP 106.3* 220.2*  --   --   --   --        Urine analysis:    Component Value Date/Time   COLORURINE YELLOW 06/23/2021 0730   APPEARANCEUR HAZY (A) 06/23/2021 0730   LABSPEC 1.010 06/23/2021 0730   PHURINE 7.0 06/23/2021 0730   GLUCOSEU NEGATIVE 06/23/2021 0730   HGBUR MODERATE (A) 06/23/2021 0730   BILIRUBINUR NEGATIVE 06/23/2021 0730   KETONESUR NEGATIVE 06/23/2021 0730   PROTEINUR 30 (A) 06/23/2021 0730   UROBILINOGEN 0.2 06/13/2012 1027    NITRITE POSITIVE (A) 06/23/2021 0730   LEUKOCYTESUR LARGE (A) 06/23/2021 0730    RADIOLOGY STUDIES/RESULTS:   No results found.   LOS: 12 days   Signature  Lala Lund M.D on 06/23/2021 at 10:13 AM   -  To page go to [email or web address]

## 2021-06-23 NOTE — Progress Notes (Signed)
Physical Therapy Treatment Patient Details Name: Parker Odonnell. MRN: [id number] DOB: Jul 10, 1932 Today's Date: 06/23/2021    History of Present Illness 87 y.o. male  presented 06/10/21 with rash, weakness. 8/14 at [facility] for same; ?shingles, started acyclovir and discharged. Rash did not improve.  PMH significant of CAD; CHF; HLD; afib on Eliquis; and HTN    PT Comments    Pt remains motivated and willing to work through his pain. "I'll do whatever you think will help me." Per RN, pt again with knees buckling during earlier transfer, therefore utilized stedy lift equipment for safety during standing activities and transfer. Continues with Rt knee buckling in pre-gait activities.     Follow Up Recommendations  CIR     Equipment Recommendations  Rolling walker with 5" wheels    Recommendations for Other Services       Precautions / Restrictions Precautions Precautions: Fall Precaution Comments: per son, 8/14 after legs began feeling weak    Mobility  Bed Mobility Overal bed mobility: Needs Assistance Bed Mobility: Supine to Sit     Supine to sit: Supervision     General bed mobility comments: incr time and cues to get out to EOB with feet on the floor    Transfers Overall transfer level: Needs assistance   Transfers: Sit to/from Stand Sit to Stand: Min assist;From elevated surface         General transfer comment: boost during initial 1/3 of movement when standing from EOB; minguard assist standing from seat of stedy; steadying assist upon standing  Ambulation/Gait             General Gait Details: pregait activities in standing in stedy with right knee buckling   Stairs             Wheelchair Mobility    Modified Rankin (Stroke Patients Only)       Balance Overall balance assessment: Needs assistance Sitting-balance support: No upper extremity supported;Feet supported Sitting balance-Leahy Scale: Fair     Standing balance support:  Bilateral upper extremity supported Standing balance-Leahy Scale: Poor Standing balance comment: reliant on RW/UE support                            Cognition Arousal/Alertness: Awake/alert Behavior During Therapy: WFL for tasks assessed/performed Overall Cognitive Status: Within Functional Limits for tasks assessed                                 General Comments: pt a&ox4      Exercises Other Exercises Other Exercises: sit to stand x 5 reps; standing for up to 90 seconds at a time; progressed to pre-gait    General Comments General comments (skin integrity, edema, etc.): son present      Pertinent Vitals/Pain Pain Assessment: Faces Faces Pain Scale: Hurts even more Pain Location: back Pain Descriptors / Indicators: Discomfort Pain Intervention(s): Limited activity within patient's tolerance;Monitored during session;Repositioned    Home Living Family/patient expects to be discharged to:: Unsure               Additional Comments: Patient lives in [place] with no other levels of care. Recommending post-acute rehab    Prior Function Level of Independence: Independent with assistive device(s)      Comments: prior to recent onset of leg weakness, modified independent with mobility using cane   PT Goals (current goals can now  be found in the care plan section) Acute Rehab PT Goals Patient Stated Goal: find out why he is weak Time For Goal Achievement: 06/25/21 Potential to Achieve Goals: Good Progress towards PT goals: Progressing toward goals    Frequency    Min 3X/week      PT Plan Current plan remains appropriate    Co-evaluation              AM-PAC PT "6 Clicks" Mobility   Outcome Measure  Help needed turning from your back to your side while in a flat bed without using bedrails?: None Help needed moving from lying on your back to sitting on the side of a flat bed without using bedrails?: A Little Help needed moving to and  from a bed to a chair (including a wheelchair)?: Total Help needed standing up from a chair using your arms (e.g., wheelchair or bedside chair)?: Total Help needed to walk in hospital room?: Total Help needed climbing 3-5 steps with a railing? : Total 6 Click Score: 11    End of Session Equipment Utilized During Treatment: Gait belt Activity Tolerance: Patient tolerated treatment well Patient left: in chair;with call bell/phone within reach;with chair alarm set;with family/visitor present Nurse Communication: Mobility status;Need for lift equipment (stedy) PT Visit Diagnosis: Other abnormalities of gait and mobility (R26.89);Muscle weakness (generalized) (M62.81);History of falling (Z91.81)     Time: [id number] PT Time Calculation (min) (ACUTE ONLY): 28 min  Charges:  $Gait Training: 8-22 mins $Therapeutic Activity: 8-22 mins                      Arby Barrette, PT Pager [phone]    Rexanne Mano 06/23/2021, 3:57 PM

## 2021-06-23 NOTE — Progress Notes (Signed)
Occupational Therapy Treatment Patient Details Name: Parker Odonnell. MRN: [id number] DOB: 06-Apr-1932 Today's Date: 06/23/2021    History of present illness 87 y.o. male  presented 06/10/21 with rash, weakness. 06/13/21 MRI brain no acute anbormality; MRI spine L1 compression fx, no cord signal changes; 8/29 MRI pelvis with sacral insufficiency fracture; 8/31 +viral meningitis  PMH significant of CAD; CHF; HLD; afib on Eliquis; and HTN   OT comments  Patient in bed, willing to participate and transfer to recliner.  Patient was mod assist to get to eob due to pain and fear of falling.  Transfer attempted with RW but patient was unsafe due to anxiety.  Max assist for transfer to recliner with SPT.   Grooming performed seated in recliner with assistance for loading toothbrush.  Acute OT to continue to follow.  Follow Up Recommendations  CIR    Equipment Recommendations  Wheelchair (measurements OT);Wheelchair cushion (measurements OT)    Recommendations for Other Services Rehab consult    Precautions / Restrictions Precautions Precautions: Fall Precaution Comments: per son, 8/14 after legs began feeling weak       Mobility Bed Mobility Overal bed mobility: Needs Assistance Bed Mobility: Supine to Sit     Supine to sit: Mod assist     General bed mobility comments: Required extra time and mod assist with hips    Transfers Overall transfer level: Needs assistance Equipment used: None Transfers: Stand Pivot Transfers Sit to Stand: Max assist;From elevated surface Stand pivot transfers: Max assist       General transfer comment: Was unsafe with RW, performed SPT    Balance Overall balance assessment: Needs assistance Sitting-balance support: Feet supported Sitting balance-Leahy Scale: Fair Sitting balance - Comments: demonstrated fair balance once his anxiety decreased Postural control: Posterior lean Standing balance support: Bilateral upper extremity supported;During  functional activity Standing balance-Leahy Scale: Poor Standing balance comment: Stood with RW but was unsafe                           ADL either performed or assessed with clinical judgement   ADL Overall ADL's : Needs assistance/impaired     Grooming: Wash/dry hands;Wash/dry face;Oral care;Minimal assistance;Sitting Grooming Details (indicate cue type and reason): Performed seated in recliner with assistance to load toothbrush                               General ADL Comments: Unsafe seated on EOB     Vision       Perception     Praxis      Cognition Arousal/Alertness: Awake/alert Behavior During Therapy: Anxious Overall Cognitive Status: Within Functional Limits for tasks assessed                                 General Comments: Anxious due to pain at RLE and fear of falling during transfer        Exercises     Shoulder Instructions       General Comments      Pertinent Vitals/ Pain       Pain Assessment: 0-10 Pain Score: 8  Faces Pain Scale: Hurts even more Pain Location: Buttocks Pain Descriptors / Indicators: Aching;Discomfort;Grimacing Pain Intervention(s): Monitored during session  Home Living  Prior Functioning/Environment              Frequency  Min 2X/week        Progress Toward Goals  OT Goals(current goals can now be found in the care plan section)  Progress towards OT goals: Progressing toward goals  Acute Rehab OT Goals Patient Stated Goal: Need to move better OT Goal Formulation: With patient Time For Goal Achievement: 06/25/21 Potential to Achieve Goals: Fair ADL Goals Pt Will Perform Grooming: sitting;standing;with min guard assist Pt Will Perform Lower Body Bathing: with min assist;sit to/from stand Pt Will Perform Lower Body Dressing: with min assist;sit to/from stand Pt Will Transfer to Toilet: with min  assist;ambulating;regular height toilet  Plan Discharge plan remains appropriate    Co-evaluation                 AM-PAC OT "6 Clicks" Daily Activity     Outcome Measure   Help from another person eating meals?: None Help from another person taking care of personal grooming?: A Little Help from another person toileting, which includes using toliet, bedpan, or urinal?: A Lot Help from another person bathing (including washing, rinsing, drying)?: A Lot Help from another person to put on and taking off regular upper body clothing?: A Little Help from another person to put on and taking off regular lower body clothing?: A Lot 6 Click Score: 16    End of Session Equipment Utilized During Treatment: Gait belt;Rolling walker  OT Visit Diagnosis: Unsteadiness on feet (R26.81);Other abnormalities of gait and mobility (R26.89);Muscle weakness (generalized) (M62.81);Pain Pain - Right/Left:  (buttocks)   Activity Tolerance Patient limited by fatigue;Patient limited by pain   Patient Left in chair;with call bell/phone within reach;with chair alarm set   Nurse Communication Patient requests pain meds        Time: [id number] OT Time Calculation (min): 29 min  Charges: OT Treatments $Self Care/Home Management : 8-22 mins $Therapeutic Activity: 8-22 mins  Lodema Hong, OTA    Parker Odonnell Alexis Goodell 06/23/2021, 9:50 AM

## 2021-06-24 LAB — BASIC METABOLIC PANEL
Anion gap: 4 — ABNORMAL LOW (ref 5–15)
BUN: 12 mg/dL (ref 8–23)
CO2: 25 mmol/L (ref 22–32)
Calcium: 8.7 mg/dL — ABNORMAL LOW (ref 8.9–10.3)
Chloride: 103 mmol/L (ref 98–111)
Creatinine, Ser: 1.02 mg/dL (ref 0.61–1.24)
GFR, Estimated: >60 mL/min (ref >60)
Glucose, Bld: 99 mg/dL (ref 70–99)
Potassium: 3.7 mmol/L (ref 3.5–5.1)
Sodium: 132 mmol/L — ABNORMAL LOW (ref 135–145)

## 2021-06-24 MED ORDER — ENSURE ENLIVE PO LIQD
237.0000 mL | Freq: Three times a day (TID) | ORAL | Status: DC
  Administered 2021-06-24 – 2021-06-25 (×5): 237 mL via ORAL

## 2021-06-24 NOTE — Progress Notes (Signed)
Nutrition Follow-up  DOCUMENTATION CODES:   Not applicable  INTERVENTION:   -Increase Ensure Enlive po to TID, each supplement provides 350 kcal and 20 grams of protein  -Continue MVI with minerals daily -Magic cup TID with meals, each supplement provides 290 kcal and 9 grams of protein   NUTRITION DIAGNOSIS:   Increased nutrient needs related to acute illness as evidenced by estimated needs.  Ongoing  GOAL:   Patient will meet greater than or equal to 90% of their needs  Progressing   MONITOR:   PO intake, Supplement acceptance, Labs, Weight trends, Skin, I & O's  REASON FOR ASSESSMENT:   Consult Assessment of nutrition requirement/status  ASSESSMENT:   Parker Odonnell. is a 87 y.o. male with medical history significant of CAD; CHF; HLD; afib on Eliquis; and HTN presenting with rash, weakness.  On 8/14, he was seen at [facility] for leg weakness, acute onset.  8/25- s/p lumbar puncture  Reviewed I/O's: -1.3 L x 24 hours and -11.1 L since admission  UOP: 1.3 L x 24 hours   Pt unavailable at time of visit.   Per MD notes, autoimmune work-up and imaging studies unrevealing so far.   Oral intake has decreased. Noted meal completion 10-50%. Pt is consuming Ensure Enlive supplements.   Pt awaiting insurance authorization for CIR transfer.   Medications reviewed and include colace.  Labs reviewed: Na: 132.   Diet Order:   Diet Order             Diet regular Room service appropriate? No; Fluid consistency: Thin  Diet effective now                   EDUCATION NEEDS:   No education needs have been identified at this time  Skin:  Skin Assessment: Reviewed RN Assessment  Last BM:  06/22/21  Height:   Ht Readings from Last 1 Encounters:  06/21/21 '5\' 8"'$  (1.727 m)    Weight:   Wt Readings from Last 1 Encounters:  06/14/21 67 kg    Ideal Body Weight:  70 kg  BMI:  Body mass index is 22.46 kg/m.  Estimated Nutritional Needs:   Kcal:   [id number]  Protein:  85-100 grams  Fluid:  > 1.7 L    Parker Odonnell, RD, LDN, [place] Registered Dietitian II Certified Diabetes Care and Education Specialist Please refer to [facility] for RD and/or RD on-call/weekend/after hours pager

## 2021-06-24 NOTE — Progress Notes (Addendum)
Inpatient Rehab Admissions Coordinator:   Request for CIR denied.  Dr. Candiss Norse completed peer to peer and denial was upheld.  I left a voicemail for pt's daughter, [name], to discuss next options.   1011: Updated pt's daughter on denial and family would like to pursue an expedited appeal.  I will start this today and a response should take about 72 hours from the time it's received.    Shann Medal, PT, DPT Admissions Coordinator [phone] 06/24/21  10:03 AM

## 2021-06-24 NOTE — Progress Notes (Signed)
ID Brief Note   Per verbal report to pharmacy , [facility] meningitis and encephalitis panel negative. Results will be faxed. No new recommendations ID will sign off. Please call with questions  Rosiland Oz, MD Infectious Disease Physician [facility] [address] [place], [place] [address] Phone: [phone]  Fax: [phone]

## 2021-06-24 NOTE — Progress Notes (Signed)
PROGRESS NOTE        PATIENT DETAILS Name: Parker Odonnell. Age: 87 y.o. Sex: male Date of Birth: [birth year] Admit Date: 06/10/2021 Admitting Physician Evalee Mutton Kristeen Mans, MD [id number], Dwyane Luo, MD  Brief Narrative: Patient is a 87 y.o. male persistent atrial fibrillation on Eliquis, HFrEF, HTN who presented to the hospital with right leg weakness and a purpuric right leg rash.  Given broad differential diagnosis (infectious/autoimmune/paraneoplastic/structural)-admitted to the [facility] service for further evaluation.  Work-up in progress-see below for results-ID/neurology following.  Significant events: 8/14>> presented to ED with right leg weakness/pain-fall-found to have rash-thought to have shingles-started on Valtrex and discharged  8/23>> admit to [facility] for right leg weakness/purpuric right leg rash eval/management 8/24>> punch biopsy by surgery 8/25>> LP-CSF w leukocytosis (lymphocytes) and elevated protein-Acyclovir started.  Significant radiological studies:  8/14>> CT head: No acute intracranial findings. 8/25>> MRI brain: No evidence of encephalitis/paraneoplastic disease/intracranial infection. 8/25>> MRA brain: No evidence of CNS vasculitis-no major stenosis. 8/25>> MRI thoracic/lumbar-spine: Compression fracture superior endplate of L1, acute fracture of left sacrum, mild edema within spinous process of T4/T5.  No MRI changes to suggest myelopathy. 8/27>> CT chest: 5 mm nodule in right major fissure-possibly intrapulmonary lymph node 8/27>> CT abdomen: No mass/adenopathy-Marked prostate enlargement-mild bilateral hydronephrosis.  Significant serological studies: 8/23>> TSH: Within normal limit 8/24>> RA factor: Negative 8/24>> ANA: Negative 8/24>> ds DNA antibody/anti-smooth antibody: Negative 8/24>> Sjogren's A/Sjogren's B antibody: Negative 8/24>> anti-smooth muscle antibody: Negative 8/24>> ANCA antibodies: Negative 8/24>> cardiolipin  antibodies: Negative 8/24>> Lyme antibody/PCR: Negative 8/24>> C3/C4/total complement: Normal limits 8/24>> C1 esterase inhibitor: Normal limit 8/24>> cryoglobulin: Pending 8/24>> SPEP/UPEP: No M spike noted.  No evidence for monoclonal antibodies 8/24>> Lupus anticoagulant: Not detected 8/25>>CSF Oligoclonal bands/IgG index:pending 8/27>>Vitamin B12: 998  Microbiology data: 8/23>> COVID PCR: Negative 8/23>> blood culture: No growth 8/24>> acute hepatitis panel: Negative 8/24>> HIV: Negative 8/25>> blood culture: No growth 8/25>> CSF cryptococcal antigen: Negative 8/25>> CSF culture: No growth for 3 days 8/25>> CSF VDRL: Non reactive 8/25>> CSF VZV PCR: Pending 8/25>> CSF HSV PCR: Negative 8/25>>CSF [facility] Encephalitis panel:Pending 8/26>>RPR:Non reactive  Pathology data: 8/24>> skin biopsy: Pending 8/25>> CSF cytology: negative for malignancy  Antimicrobial therapy: Acyclovir: 8/25>>  Procedures : 8/24>> punch biopsy performed by general surgery:   8/25>> lumbar puncture by neurology  Consults: Neuro, ID, general surgery  DVT Prophylaxis : apixaban (ELIQUIS) tablet 2.5 mg Start: 06/18/21 2200Eliquis  apixaban (ELIQUIS) tablet 2.5 mg   Subjective:  Patient in bed, appears comfortable, denies any headache, no fever, no chest pain or pressure, no shortness of breath , no abdominal pain. No new focal weakness.   Assessment/Plan:  Bilateral lower extremity weakness, right more than left/purpuric right lower extremity rash: Etiology unclear-differentials include infectious/vasculitis/paraneoplastic-extensive work-up in process.  Imaging studies so far unrevealing.  Autoimmune work-up negative so far.  Skin biopsy results still pending. No masses or lymphadenopathy is seen on CT chest/abdomen.  Given lymphocytic pleocytosis and elevated protein on CSF, HSV PCR negative.  VZV PCR is now +ve.  Continue acyclovir for stop date 06/25/21. Off Isolation.   Viral Leg rash - all  lesions are old and fully crusted, DC'd isolation 06/21/21.  Hyponatremia: Due to dehydration resolved after IV fluids, now had some SIADH, better after Lasix x 1. Stable.  Persistent atrial fibrillation: Rate controlled-on Coreg-Eliquis.  HFrEF: Volume status is  stable-continue to hold diuretics for now.  EF 35 to 40% based on echocardiogram done in 2020.  History of CAD-s/p remote PCI: No anginal symptoms.  Hyperlipidemia: Continue statin  Essential hypertension: BP stable-stopped ARB due to AKI, Coreg and Norvasc combination.  Large prostate with mild hydronephrosis seen on CT abdomen: Renal function stable-started on Flomax +  Foley with outpt Urology follow up.  Failed Foley removal trial on 06/21/2021.  L1 superior endplate compression fracture/mild edema within spinous process of T4/T5, Sacral insufficiency fracture : Seen incidentally on MRI imaging.  Supportive care, no pain.  5 mm right lower lobe pulmonary nodule versus intrapulmonary lymph node: Repeat noncontrast CT chest in 12 months to reassess.  Mild AKI - IVF, as on Acylovir, continue Foley. DC'd ARB. Resolved.   Diet: Diet Order             Diet regular Room service appropriate? No; Fluid consistency: Thin  Diet effective now                    Code Status:  DNR  Family Communication:  [id number] called 06/17/21, 06/18/21 - detailed updates, 06/19/21 -message left at 12:27 PM, bedside 06/22/21  Disposition Plan: CIR is recommended by PT and OT.  Status is: Inpatient  The patient will require care spanning > 2 midnights and should be moved to inpatient because: Inpatient level of care appropriate due to severity of illness  Dispo: The patient is from: Home              Anticipated d/c is to: SNF - CIR denied, peer to peer was done 06/24/21              Patient currently is not medically stable to d/c.   Difficult to place patient No   Barriers to Discharge: Right lower extremity  weakness-rash of unknown etiology-work-up in progress  Antimicrobial agents: Anti-infectives (From admission, onward)    Start     Dose/Rate Route Frequency Ordered Stop   06/16/21 2000  acyclovir (ZOVIRAX) 660 mg in dextrose 5 % 100 mL IVPB  Status:  Discontinued        660 mg 113.2 mL/hr over 60 Minutes Intravenous Every 12 hours 06/16/21 0930 06/16/21 1011   06/16/21 2000  acyclovir (ZOVIRAX) 660 mg in dextrose 5 % 100 mL IVPB        660 mg 113.2 mL/hr over 60 Minutes Intravenous Every 12 hours 06/16/21 1021 06/25/21 2359   06/12/21 1815  acyclovir (ZOVIRAX) 660 mg in dextrose 5 % 100 mL IVPB  Status:  Discontinued        10 mg/kg  66.2 kg 113.2 mL/hr over 60 Minutes Intravenous Every 12 hours 06/12/21 1718 06/16/21 0930       MEDICATIONS:  Scheduled Meds:  amLODipine  10 mg Oral Daily   apixaban  2.5 mg Oral BID   atorvastatin  40 mg Oral QHS   carvedilol  6.25 mg Oral BID WC   Chlorhexidine Gluconate Cloth  6 each Topical Daily   docusate sodium  100 mg Oral BID   feeding supplement  237 mL Oral TID BM   multivitamin with minerals  1 tablet Oral Daily   mupirocin cream   Topical BID   pregabalin  50 mg Oral BID   tamsulosin  0.4 mg Oral Daily   Continuous Infusions:  sodium chloride 30 mL/hr at 06/22/21 1512   acyclovir 660 mg (06/23/21 2230)   PRN Meds:.acetaminophen **OR** [DISCONTINUED] acetaminophen, bisacodyl,  hydrALAZINE, lip balm, liver oil-zinc oxide, [DISCONTINUED] ondansetron **OR** ondansetron (ZOFRAN) IV, polyethylene glycol, polyvinyl alcohol, traMADol   PHYSICAL EXAM: Vital signs: Vitals:   06/23/21 2013 06/24/21 0042 06/24/21 0500 06/24/21 0716  BP: (!) 142/61 (!) 103/53 122/62 (!) 165/98  Pulse: 61 66 70 78  Resp: '18 18 18 18  '$ Temp: 98 F (36.7 C) 98.7 F (37.1 C) 99 F (37.2 C) 98.7 F (37.1 C)  TempSrc: Oral Oral Oral Oral  SpO2: 95% 93% 94% 93%  Weight:      Height:       Filed Weights   06/14/21 0407  Weight: 67 kg   Body mass  index is 22.46 kg/m.   Exam:  Awake Alert, Weakness noted bilateral lower extremities.  Right greater than left.  [place].AT,PERRAL Supple Neck,No JVD, No cervical lymphadenopathy appriciated.  Symmetrical Chest wall movement, Good air movement bilaterally, CTAB RRR,No Gallops, Rubs or new Murmurs, No Parasternal Heave +ve B.Sounds, Abd Soft, No tenderness, No organomegaly appriciated, No rebound - guarding or rigidity. Purpuric rash on right lower extremity >> Left, all crusted    I have personally reviewed following labs and imaging studies  LABORATORY DATA:  Recent Labs  Lab 06/18/21 0140 06/19/21 0320 06/20/21 0100 06/21/21 0043  WBC 6.2 5.3 4.4 4.3  HGB 10.0* 10.3* 9.9* 9.7*  HCT 29.6* 30.6* 28.9* 28.6*  PLT 135* 125* 125* 122*  MCV 100.3* 101.0* 99.3 100.4*  MCH 33.9 34.0 34.0 34.0  MCHC 33.8 33.7 34.3 33.9  RDW 13.7 13.7 13.6 13.7  LYMPHSABS 0.9 1.1 1.0 0.8  MONOABS 0.5 0.4 0.4 0.4  EOSABS 0.2 0.2 0.1 0.2  BASOSABS 0.0 0.0 0.0 0.0    Recent Labs  Lab 06/18/21 0140 06/19/21 0320 06/20/21 0100 06/21/21 0043 06/22/21 0012 06/23/21 0045 06/24/21 0249  NA 133*  --  133* 135 135 131* 132*  K 4.6  --  4.1 4.1 3.9 3.9 3.7  CL 107  --  104 105 105 102 103  CO2 20*  --  '25 25 26 25 25  '$ GLUCOSE 103*  --  102* 109* 147* 113* 99  BUN 20  --  '16 15 14 13 12  '$ CREATININE 1.33*  --  1.11 1.02 0.98 0.92 1.02  CALCIUM 8.9  --  8.8* 8.8* 8.8* 8.7* 8.7*  AST 26  --  '18 20 20 20  '$ --   ALT 18  --  '19 19 21 21  '$ --   ALKPHOS 100  --  101 88 95 99  --   BILITOT 1.2  --  1.0 1.2 1.1 0.8  --   ALBUMIN 2.6*  --  2.7* 2.6* 2.7* 2.7*  --   MG 1.9 1.7  --   --   --   --   --   BNP 106.3* 220.2*  --   --   --   --   --        Urine analysis:    Component Value Date/Time   COLORURINE YELLOW 06/23/2021 0730   APPEARANCEUR HAZY (A) 06/23/2021 0730   LABSPEC 1.010 06/23/2021 0730   PHURINE 7.0 06/23/2021 0730   GLUCOSEU NEGATIVE 06/23/2021 0730   HGBUR MODERATE (A) 06/23/2021  0730   BILIRUBINUR NEGATIVE 06/23/2021 0730   KETONESUR NEGATIVE 06/23/2021 0730   PROTEINUR 30 (A) 06/23/2021 0730   UROBILINOGEN 0.2 06/13/2012 1027   NITRITE POSITIVE (A) 06/23/2021 0730   LEUKOCYTESUR LARGE (A) 06/23/2021 0730    RADIOLOGY STUDIES/RESULTS:   No results  found.   LOS: 13 days   Signature  Lala Lund M.D on 06/24/2021 at 9:55 AM   -  To page go to [email or web address]

## 2021-06-25 ENCOUNTER — Encounter (HOSPITAL_COMMUNITY): Payer: Self-pay | Admitting: Physical Medicine & Rehabilitation

## 2021-06-25 ENCOUNTER — Other Ambulatory Visit: Payer: Self-pay

## 2021-06-25 ENCOUNTER — Inpatient Hospital Stay (HOSPITAL_COMMUNITY)
Admission: RE | Admit: 2021-06-25 | Discharge: 2021-07-18 | DRG: 945 | Disposition: A | Discharge status: Skilled Nursing Facility | Payer: Medicare Other | Source: Intra-hospital | Attending: Physical Medicine & Rehabilitation | Admitting: Physical Medicine & Rehabilitation

## 2021-06-25 DIAGNOSIS — B019 Varicella without complication: Principal | ICD-10-CM | POA: Diagnosis present

## 2021-06-25 DIAGNOSIS — I4819 Other persistent atrial fibrillation: Secondary | ICD-10-CM | POA: Diagnosis present

## 2021-06-25 DIAGNOSIS — I5032 Chronic diastolic (congestive) heart failure: Secondary | ICD-10-CM | POA: Diagnosis present

## 2021-06-25 DIAGNOSIS — N400 Enlarged prostate without lower urinary tract symptoms: Secondary | ICD-10-CM | POA: Diagnosis present

## 2021-06-25 DIAGNOSIS — K5901 Slow transit constipation: Secondary | ICD-10-CM | POA: Diagnosis not present

## 2021-06-25 DIAGNOSIS — H814 Vertigo of central origin: Secondary | ICD-10-CM | POA: Diagnosis not present

## 2021-06-25 DIAGNOSIS — Z20822 Contact with and (suspected) exposure to covid-19: Secondary | ICD-10-CM | POA: Diagnosis present

## 2021-06-25 DIAGNOSIS — K5900 Constipation, unspecified: Secondary | ICD-10-CM

## 2021-06-25 DIAGNOSIS — E785 Hyperlipidemia, unspecified: Secondary | ICD-10-CM

## 2021-06-25 DIAGNOSIS — Z6820 Body mass index (BMI) 20.0-20.9, adult: Secondary | ICD-10-CM | POA: Diagnosis not present

## 2021-06-25 DIAGNOSIS — M792 Neuralgia and neuritis, unspecified: Secondary | ICD-10-CM

## 2021-06-25 DIAGNOSIS — Z7901 Long term (current) use of anticoagulants: Secondary | ICD-10-CM | POA: Diagnosis not present

## 2021-06-25 DIAGNOSIS — Z955 Presence of coronary angioplasty implant and graft: Secondary | ICD-10-CM | POA: Diagnosis not present

## 2021-06-25 DIAGNOSIS — L89312 Pressure ulcer of right buttock, stage 2: Secondary | ICD-10-CM | POA: Diagnosis present

## 2021-06-25 DIAGNOSIS — Z79899 Other long term (current) drug therapy: Secondary | ICD-10-CM

## 2021-06-25 DIAGNOSIS — B029 Zoster without complications: Secondary | ICD-10-CM | POA: Diagnosis not present

## 2021-06-25 DIAGNOSIS — B021 Zoster meningitis: Secondary | ICD-10-CM | POA: Diagnosis present

## 2021-06-25 DIAGNOSIS — E440 Moderate protein-calorie malnutrition: Secondary | ICD-10-CM | POA: Diagnosis present

## 2021-06-25 DIAGNOSIS — I10 Essential (primary) hypertension: Secondary | ICD-10-CM | POA: Diagnosis not present

## 2021-06-25 DIAGNOSIS — E875 Hyperkalemia: Secondary | ICD-10-CM | POA: Diagnosis present

## 2021-06-25 DIAGNOSIS — N1330 Unspecified hydronephrosis: Secondary | ICD-10-CM | POA: Diagnosis present

## 2021-06-25 DIAGNOSIS — Z66 Do not resuscitate: Secondary | ICD-10-CM | POA: Diagnosis present

## 2021-06-25 DIAGNOSIS — R5381 Other malaise: Secondary | ICD-10-CM | POA: Diagnosis present

## 2021-06-25 DIAGNOSIS — L8990 Pressure ulcer of unspecified site, unspecified stage: Secondary | ICD-10-CM | POA: Insufficient documentation

## 2021-06-25 DIAGNOSIS — I2510 Atherosclerotic heart disease of native coronary artery without angina pectoris: Secondary | ICD-10-CM | POA: Diagnosis present

## 2021-06-25 DIAGNOSIS — A879 Viral meningitis, unspecified: Secondary | ICD-10-CM | POA: Diagnosis not present

## 2021-06-25 DIAGNOSIS — Z96642 Presence of left artificial hip joint: Secondary | ICD-10-CM | POA: Diagnosis present

## 2021-06-25 DIAGNOSIS — R911 Solitary pulmonary nodule: Secondary | ICD-10-CM | POA: Diagnosis present

## 2021-06-25 DIAGNOSIS — R609 Edema, unspecified: Secondary | ICD-10-CM | POA: Diagnosis not present

## 2021-06-25 DIAGNOSIS — R4189 Other symptoms and signs involving cognitive functions and awareness: Secondary | ICD-10-CM | POA: Diagnosis not present

## 2021-06-25 DIAGNOSIS — I5042 Chronic combined systolic (congestive) and diastolic (congestive) heart failure: Secondary | ICD-10-CM

## 2021-06-25 DIAGNOSIS — E871 Hypo-osmolality and hyponatremia: Secondary | ICD-10-CM | POA: Diagnosis present

## 2021-06-25 DIAGNOSIS — R42 Dizziness and giddiness: Secondary | ICD-10-CM | POA: Diagnosis not present

## 2021-06-25 DIAGNOSIS — H548 Legal blindness, as defined in USA: Secondary | ICD-10-CM | POA: Diagnosis present

## 2021-06-25 DIAGNOSIS — B0223 Postherpetic polyneuropathy: Secondary | ICD-10-CM | POA: Diagnosis present

## 2021-06-25 DIAGNOSIS — I447 Left bundle-branch block, unspecified: Secondary | ICD-10-CM | POA: Diagnosis present

## 2021-06-25 DIAGNOSIS — I776 Arteritis, unspecified: Secondary | ICD-10-CM

## 2021-06-25 DIAGNOSIS — I110 Hypertensive heart disease with heart failure: Secondary | ICD-10-CM | POA: Diagnosis present

## 2021-06-25 DIAGNOSIS — D696 Thrombocytopenia, unspecified: Secondary | ICD-10-CM | POA: Diagnosis present

## 2021-06-25 DIAGNOSIS — R21 Rash and other nonspecific skin eruption: Secondary | ICD-10-CM | POA: Diagnosis not present

## 2021-06-25 DIAGNOSIS — Z8261 Family history of arthritis: Secondary | ICD-10-CM

## 2021-06-25 DIAGNOSIS — M8448XD Pathological fracture, other site, subsequent encounter for fracture with routine healing: Secondary | ICD-10-CM | POA: Diagnosis present

## 2021-06-25 MED ORDER — ENSURE ENLIVE PO LIQD
237.0000 mL | Freq: Three times a day (TID) | ORAL | Status: DC
  Administered 2021-06-25 – 2021-07-18 (×55): 237 mL via ORAL
  Filled 2021-06-25 (×2): qty 237

## 2021-06-25 MED ORDER — ZINC OXIDE 40 % EX OINT
1.0000 "application " | TOPICAL_OINTMENT | Freq: Three times a day (TID) | CUTANEOUS | Status: DC | PRN
  Administered 2021-07-04 – 2021-07-16 (×6): 1 via TOPICAL
  Filled 2021-06-25 (×2): qty 57

## 2021-06-25 MED ORDER — AMLODIPINE BESYLATE 10 MG PO TABS
10.0000 mg | ORAL_TABLET | Freq: Every day | ORAL | Status: DC
  Administered 2021-06-26 – 2021-06-30 (×5): 10 mg via ORAL
  Filled 2021-06-25 (×5): qty 1

## 2021-06-25 MED ORDER — MUPIROCIN CALCIUM 2 % EX CREA
1.0000 "application " | TOPICAL_CREAM | Freq: Two times a day (BID) | CUTANEOUS | Status: DC
  Administered 2021-06-25 – 2021-07-18 (×45): 1 via TOPICAL
  Filled 2021-06-25: qty 15

## 2021-06-25 MED ORDER — CARVEDILOL 6.25 MG PO TABS: 6.2500 mg | ORAL_TABLET | Freq: Two times a day (BID) | ORAL | Status: AC

## 2021-06-25 MED ORDER — POLYVINYL ALCOHOL 1.4 % OP SOLN: 1.0000 [drp] | Freq: Two times a day (BID) | OPHTHALMIC | Status: DC | PRN

## 2021-06-25 MED ORDER — PREGABALIN 25 MG PO CAPS
50.0000 mg | ORAL_CAPSULE | Freq: Two times a day (BID) | ORAL | Status: DC
  Administered 2021-06-25 – 2021-06-26 (×2): 50 mg via ORAL
  Filled 2021-06-25 (×3): qty 2

## 2021-06-25 MED ORDER — LIP MEDEX EX OINT
1.0000 "application " | TOPICAL_OINTMENT | CUTANEOUS | Status: DC | PRN
  Filled 2021-06-25: qty 7

## 2021-06-25 MED ORDER — TAMSULOSIN HCL 0.4 MG PO CAPS
0.4000 mg | ORAL_CAPSULE | Freq: Every day | ORAL | Status: DC
  Administered 2021-06-26 – 2021-07-18 (×23): 0.4 mg via ORAL
  Filled 2021-06-25 (×23): qty 1

## 2021-06-25 MED ORDER — CHLORHEXIDINE GLUCONATE CLOTH 2 % EX PADS
6.0000 | MEDICATED_PAD | Freq: Two times a day (BID) | CUTANEOUS | Status: DC
  Administered 2021-06-25 – 2021-07-18 (×47): 6 via TOPICAL

## 2021-06-25 MED ORDER — DEXTROSE 5 % IV SOLN: 660.0000 mg | Freq: Two times a day (BID) | INTRAVENOUS | Status: DC

## 2021-06-25 MED ORDER — ENSURE ENLIVE PO LIQD: 237.0000 mL | Freq: Three times a day (TID) | ORAL | 12 refills | Status: DC

## 2021-06-25 MED ORDER — TAMSULOSIN HCL 0.4 MG PO CAPS: 0.4000 mg | ORAL_CAPSULE | Freq: Every day | ORAL | Status: DC

## 2021-06-25 MED ORDER — ACETAMINOPHEN 325 MG PO TABS: 650.0000 mg | ORAL_TABLET | Freq: Four times a day (QID) | ORAL | Status: AC | PRN

## 2021-06-25 MED ORDER — TRAMADOL HCL 50 MG PO TABS
50.0000 mg | ORAL_TABLET | Freq: Four times a day (QID) | ORAL | Status: DC | PRN
  Administered 2021-06-26 – 2021-07-11 (×14): 50 mg via ORAL
  Filled 2021-06-25 (×15): qty 1

## 2021-06-25 MED ORDER — CARVEDILOL 6.25 MG PO TABS
6.2500 mg | ORAL_TABLET | Freq: Two times a day (BID) | ORAL | Status: DC
  Administered 2021-06-26 – 2021-07-18 (×46): 6.25 mg via ORAL
  Filled 2021-06-25 (×46): qty 1

## 2021-06-25 MED ORDER — DOCUSATE SODIUM 100 MG PO CAPS
100.0000 mg | ORAL_CAPSULE | Freq: Two times a day (BID) | ORAL | Status: DC
  Administered 2021-06-25 – 2021-07-11 (×31): 100 mg via ORAL
  Filled 2021-06-25 (×32): qty 1

## 2021-06-25 MED ORDER — ADULT MULTIVITAMIN W/MINERALS CH
1.0000 | ORAL_TABLET | Freq: Every day | ORAL | Status: DC
  Administered 2021-06-26 – 2021-07-18 (×23): 1 via ORAL
  Filled 2021-06-25 (×23): qty 1

## 2021-06-25 MED ORDER — AMLODIPINE BESYLATE 10 MG PO TABS: 10.0000 mg | ORAL_TABLET | Freq: Every day | ORAL | Status: DC

## 2021-06-25 MED ORDER — BISACODYL 5 MG PO TBEC
5.0000 mg | DELAYED_RELEASE_TABLET | Freq: Every day | ORAL | Status: DC | PRN
  Administered 2021-07-03: 5 mg via ORAL
  Filled 2021-06-25: qty 1

## 2021-06-25 MED ORDER — TRAMADOL HCL 50 MG PO TABS: 50.0000 mg | ORAL_TABLET | Freq: Two times a day (BID) | ORAL | Status: DC | PRN

## 2021-06-25 MED ORDER — APIXABAN 2.5 MG PO TABS
2.5000 mg | ORAL_TABLET | Freq: Two times a day (BID) | ORAL | Status: DC
  Administered 2021-06-25 – 2021-07-18 (×46): 2.5 mg via ORAL
  Filled 2021-06-25 (×46): qty 1

## 2021-06-25 MED ORDER — DEXTROSE 5 % IV SOLN
660.0000 mg | Freq: Once | INTRAVENOUS | Status: AC
  Administered 2021-06-25: 660 mg via INTRAVENOUS
  Filled 2021-06-25: qty 13.2

## 2021-06-25 MED ORDER — POLYETHYLENE GLYCOL 3350 17 G PO PACK: 17.0000 g | PACK | Freq: Every day | ORAL | Status: DC | PRN

## 2021-06-25 MED ORDER — ACETAMINOPHEN 325 MG PO TABS
650.0000 mg | ORAL_TABLET | Freq: Four times a day (QID) | ORAL | Status: DC | PRN
  Administered 2021-06-26 – 2021-07-18 (×15): 650 mg via ORAL
  Filled 2021-06-25 (×15): qty 2

## 2021-06-25 MED ORDER — PREGABALIN 50 MG PO CAPS: 50.0000 mg | ORAL_CAPSULE | Freq: Two times a day (BID) | ORAL | Status: DC

## 2021-06-25 MED ORDER — ATORVASTATIN CALCIUM 40 MG PO TABS
40.0000 mg | ORAL_TABLET | Freq: Every day | ORAL | Status: DC
  Administered 2021-06-25 – 2021-07-17 (×23): 40 mg via ORAL
  Filled 2021-06-25 (×23): qty 1

## 2021-06-25 NOTE — Progress Notes (Signed)
Inpatient Rehabilitation Medication Review by a Pharmacist  A complete drug regimen review was completed for this patient to identify any potential clinically significant medication issues.  High Risk Drug Classes Is patient taking? Indication by Medication  Antipsychotic No   Anticoagulant Yes Apixaban for atrial fibrillation  Antibiotic Yes, as an intravenous medication Last dose of IV acyclovir tonight  Opioid Yes Tramadol PRN for pain  Antiplatelet No   Hypoglycemics/insulin No   Vasoactive Medication Yes Amlodipine, carvedilol for hypertension  Chemotherapy No   Other No      Type of Medication Issue Identified Description of Issue Recommendation(s)  Drug Interaction(s) (clinically significant)     Duplicate Therapy     Allergy     No Medication Administration End Date     Incorrect Dose     Additional Drug Therapy Needed  Pt needed order for last dose of IV acyclovir tonight (for total of 19 ordered doses) Add order for last dose of acyclovir tonight (per phone conversation with Marlowe Shores, PA)  Significant med changes from prior encounter (inform family/care partners about these prior to discharge).    Other  The following medications were listed on [facility] discharge summary, but pt did not receive these at [facility] and they are not ordered on admission to CIR: B complex multivitamin Methocarbamol Ocuvite-lutein ocular vitamin CIR team to review on AM rounds to determine if pt needs these medications at this time    Clinically significant medication issues were identified that warrant physician communication and completion of prescribed/recommended actions by midnight of the next day:  Yes  Name of provider notified for urgent issues identified:  Marlowe Shores, PA  Provider Method of Notification:  After hours phone conversation  Time spent performing this drug regimen review (minutes):  [place], PharmD, BCPS, [facility] Clinical  Pharmacist 06/25/2021 6:37 PM

## 2021-06-25 NOTE — H&P (Addendum)
Physical Medicine and Rehabilitation Admission H&P    Chief Complaint  Patient presents with   Weakness  : HPI: Parker Sink. is an 87 year old right-handed male with history of CAD, legally blind, diastolic congestive heart failure, hyperlipidemia, atrial fibrillation maintained on Eliquis as well as left hip replacement.  History taken from chart review and patient.  Patient lives alone independent living facility/[place].  Uses a rolling walker/cane for ambulation.  He has a son and daughter in the area with good support planning on hired caregivers as needed.  He presented on 06/10/2021 with right leg pain since 05/30/21 as well as recent fall.  Denied LOC.  Noted recent rash started on valacyclovir for suspected shingles uncertain diagnosis.  Admission chemistries unremarkable except sodium 125, sedimentation rate within normal limits 15, blood cultures no growth to date, troponin negative, BNP 161.8, uric acid within normal limits.  MRI of the brain unremarkable for acute intracranial.  There was a 9 mm nodular lesion along the left lateral aspect of the medulla, indeterminate.  No associated mass-effect.  MRA of head and neck negative.  MRI thoracic lumbar spine showed acute compression fracture involving the superior endplate of L1 with up to 35% height loss without significant bony retropulsion.  Additional probable acute infarct of the left sacrum partially visualized.  Mild edema within the spinous process of T4 and T5 of uncertain etiology.  CT of the chest abdomen pelvis showed no mass or adenopathy.  There was a 5 mm right lower lobe pulmonary nodule recommend CT scan in 12 months.  MRI follow-up of the pelvis evaluation significantly limited by metallic susceptibility artifact related to left hip arthroplasty.  There was relatively focal bone marrow edema of the left aspect of S3 consistent with insufficiency fracture.  He did undergo LP CSF with leukocytosis/lymphocytes and  elevated protein.  Neurology as well as ID consulted for follow-up bilateral lower extremity weakness right more than left/purpuric right lower extremity rash.  Etiology unclear differential included infectious vasculitis paraneoplastic extensive work-up noted.  Imaging so far was unrevealing.  Autoimmune work-up negative.  Skin biopsy is pending.  Given lymphocytic pleocytosis and elevated protein on CSF, VZV PCR now positive continued on acyclovir stop date on 06/25/2021 admission isolation discontinued.  He remains on chronic Eliquis as prior to admission.  Patient with BPH mild hydronephrosis seen on CT abdomen maintain on Flomax and a Foley catheter tube was placed after failed voiding trial and would continue to maintain Foley until follow-up outpatient urology.  Therapy evaluations completed due to patient decreased functional as well as cognitive ability was admitted for a comprehensive rehab program.  Please see preadmission assessment from earlier today as well.  Review of Systems  Constitutional:  Positive for malaise/fatigue. Negative for chills and fever.  HENT:  Negative for hearing loss.   Eyes:  Negative for blurred vision and double vision.       Legally blind  Respiratory:  Negative for cough and shortness of breath.   Cardiovascular:  Negative for chest pain and palpitations.  Gastrointestinal:  Positive for constipation. Negative for heartburn, nausea and vomiting.       GERD  Genitourinary:  Negative for dysuria, flank pain and hematuria.  Musculoskeletal:  Positive for back pain, falls, joint pain and myalgias.  Skin:  Positive for rash.  Neurological:  Positive for focal weakness and weakness.  All other systems reviewed and are negative. Past Medical History:  Diagnosis Date   Arthritis  ASCVD (arteriosclerotic cardiovascular disease)    single vessel   Cancer (HCC)    CHF (congestive heart failure) (HCC)    Coronary atherosclerosis of native coronary artery    DNR (do  not resuscitate) 06/10/2021   Dyslipidemia    ED (erectile dysfunction)    GERD (gastroesophageal reflux disease)    hx of   History of kidney stones    Hypertension    Inguinal hernia    Left bundle branch block    chronic   Postsurgical percutaneous transluminal coronary angioplasty status    Shingles    Past Surgical History:  Procedure Laterality Date   [place] TEST  05/25/12   [place]   INGUINAL HERNIA REPAIR  06/15/2012   Procedure: HERNIA REPAIR INGUINAL ADULT;  Surgeon: Adin Hector, MD;  Location: [facility];  Service: General;  Laterality: Bilateral;  Bilateral Inguinal Hernia Repair with Ultrapro Mesh   MOHS SURGERY     RIGHT/LEFT HEART CATH AND CORONARY ANGIOGRAPHY N/A 11/13/2019   Procedure: RIGHT/LEFT HEART CATH AND CORONARY ANGIOGRAPHY;  Surgeon: Jettie Booze, MD;  Location: [place] [facility];  Service: Cardiovascular;  Laterality: N/A;   TOTAL HIP ARTHROPLASTY Left 01/10/2020   Procedure: TOTAL HIP ARTHROPLASTY ANTERIOR APPROACH;  Surgeon: Gaynelle Arabian, MD;  Location: [facility];  Service: Orthopedics;  Laterality: Left;  134mn   Family History  Problem Relation Age of Onset   Arthritis Father    Hypertension Paternal Uncle    Heart attack Neg Hx    Stroke Neg Hx    Social History:  reports that he has never smoked. He has never used smokeless tobacco. He reports that he does not drink alcohol and does not use drugs. Allergies: No Known Allergies Medications Prior to Admission  Medication Sig Dispense Refill   atorvastatin (LIPITOR) 40 MG tablet TAKE ONE TABLET BY MOUTH EVERYDAY AT BEDTIME 90 tablet 3   b complex vitamins tablet Take 1 tablet by mouth daily.     carvedilol (COREG) 3.125 MG tablet TAKE ONE TABLET BY MOUTH EVERY MORNING and TAKE ONE TABLET BY MOUTH EVERYDAY AT BEDTIME 180 tablet 3   ELIQUIS 2.5 MG TABS tablet TAKE ONE TABLET BY MOUTH EVERY MORNING and  TAKE ONE TABLET BY MOUTH EVERYDAY AT BEDTIME 60 tablet 5   furosemide (LASIX) 20 MG tablet Take 1 tablet (20 mg total) by mouth daily. 30 tablet 0   losartan (COZAAR) 50 MG tablet Take 50 mg by mouth daily.     Multiple Vitamin (MULTIVITAMIN WITH MINERALS) TABS tablet Take 1 tablet by mouth every other day. [facility]  Spectravite Multivitamin     Multiple Vitamins-Minerals (OCUVITE-LUTEIN PO) Take 1 capsule by mouth daily.     Polyethyl Glycol-Propyl Glycol 0.4-0.3 % SOLN Apply 1 drop to eye 2 (two) times daily as needed (dry eyes).      spironolactone (ALDACTONE) 25 MG tablet Take 0.5 tablets (12.5 mg total) by mouth daily. 30 tablet 0   lisinopril (PRINIVIL,ZESTRIL) 20 MG tablet Take 1 tablet (20 mg total) by mouth daily. (Patient not taking: No sig reported) 30 tablet 0   methocarbamol (ROBAXIN) 500 MG tablet Take 1 tablet (500 mg total) by mouth every 6 (six) hours as needed for muscle spasms. (Patient not taking: No sig reported) 40 tablet 0   traMADol (ULTRAM) 50 MG tablet Take 1 tablet (50 mg total) by mouth every 6 (six) hours as  needed for up to 10 doses. (Patient not taking: No sig reported) 10 tablet 0    Drug Regimen Review Drug regimen was reviewed and remains appropriate with no significant issues identified  Home: Home Living Family/patient expects to be discharged to:: Unsure Living Arrangements: Alone Available Help at Discharge: Family, Available PRN/intermittently, Other (Comment) (facility staff assist with showers and meds.) Type of Home: Apartment Home Access: Level entry, Elevator Home Layout: One level Bathroom Shower/Tub: Multimedia programmer: Handicapped height Bathroom Accessibility: Yes Home Equipment: Environmental consultant - 2 wheels, Grab bars - tub/shower, Grab bars - toilet, Shower seat Additional Comments: Patient lives in [place] with no other levels of care. Recommending post-acute rehab   Functional History: Prior Function Level of Independence: Independent with  assistive device(s) Gait / Transfers Assistance Needed: Patient walks to dinning room with RW, uses elevator from 2 nd floor to 1st floor. ADL's / Homemaking Assistance Needed: Patient independent with sink baths, facility prepars meals, assist with meds, showers  twice a week with supervision/assist as needed. Comments: prior to recent onset of leg weakness, modified independent with mobility using cane  Functional Status:  Mobility: Bed Mobility Overal bed mobility: Needs Assistance Bed Mobility: Supine to Sit Supine to sit: Supervision Sit to supine: Min guard General bed mobility comments:  (Patient up in recliner with complaints of discomfort) Transfers Overall transfer level: Needs assistance Equipment used: Rolling walker (2 wheeled) Transfer via Lift Equipment: Stedy Transfers: Sit to/from Stand Sit to Stand: Mod assist, +2 physical assistance (using walker mod assist +2, min assist +2 with stedy) Stand pivot transfers: Max assist General transfer comment: performed one sit to stand at rw and 3 from stedy.  very fearful of falling Ambulation/Gait Ambulation/Gait assistance: Min assist, +2 safety/equipment Gait Distance (Feet): 2 Feet Assistive device: Rolling walker (2 wheeled) Gait Pattern/deviations: Step-to pattern General Gait Details: pregait/weight shifting in standing in stedy with right knee buckling    ADL: ADL Overall ADL's : Needs assistance/impaired Eating/Feeding: Set up, Sitting Grooming: Wash/dry hands, Wash/dry face, Oral care, Minimal assistance, Sitting Grooming Details (indicate cue type and reason): Performed seated in recliner with assistance to load toothbrush Upper Body Bathing: Set up, Sitting Lower Body Bathing: Maximal assistance, Sit to/from stand Lower Body Bathing Details (indicate cue type and reason):  (Patient stood at RW to perform perineal cleaning with max assist) Upper Body Dressing : Minimal assistance, Sitting Lower Body Dressing:  Moderate assistance, Sit to/from stand Toilet Transfer: Minimal assistance, BSC, Stand-pivot Functional mobility during ADLs: Minimal assistance, Rolling walker General ADL Comments: Unsafe seated on EOB  Cognition: Cognition Overall Cognitive Status: Within Functional Limits for tasks assessed Orientation Level: Disoriented to time, Disoriented to place Cognition Arousal/Alertness: Awake/alert Behavior During Therapy: Anxious, Restless Overall Cognitive Status: Within Functional Limits for tasks assessed General Comments: pt a&ox4  Physical Exam: Blood pressure 113/66, pulse 77, temperature 97.8 F (36.6 C), temperature source Oral, resp. rate 18, height '5\' 8"'$  (1.727 m), weight 67 kg, SpO2 95 %. Physical Exam Vitals reviewed.  Constitutional:      Appearance: Normal appearance.  HENT:     Head: Normocephalic and atraumatic.     Right Ear: External ear normal.     Left Ear: External ear normal.     Nose: Nose normal.  Eyes:     General:        Right eye: No discharge.        Left eye: No discharge.     Extraocular Movements: Extraocular movements intact.  Cardiovascular:     Rate and Rhythm: Normal rate and regular rhythm.  Pulmonary:     Effort: Pulmonary effort is normal. No respiratory distress.     Breath sounds: Normal breath sounds. No stridor.  Abdominal:     General: There is distension.     Tenderness: There is no abdominal tenderness.  Genitourinary:    Comments: +Foley Musculoskeletal:        General: No tenderness.     Cervical back: Normal range of motion and neck supple.     Comments: LE edema  Skin:    General: Skin is warm and dry.  Neurological:     Mental Status: He is alert.     Comments: Patient is alert sitting up in chair, slumped to the side.   Follows verbal commands.   Provides name and age and place however he could not recall the name of his independent living facility. He is a limited medical historian. Motor: Bilateral upper  extremities: 4/5 proximal distal Bilateral lower extremities: Hip flexion, knee extension 2+/5, ankle dorsiflexion 4/5  Psychiatric:        Speech: Speech is tangential.        Cognition and Memory: Cognition is impaired.    Results for orders placed or performed during the hospital encounter of 06/10/21 (from the past 48 hour(s))  Basic metabolic panel     Status: Abnormal   Collection Time: 06/24/21  2:49 AM  Result Value Ref Range   Sodium 132 (L) 135 - 145 mmol/L   Potassium 3.7 3.5 - 5.1 mmol/L   Chloride 103 98 - 111 mmol/L   CO2 25 22 - 32 mmol/L   Glucose, Bld 99 70 - 99 mg/dL    Comment: Glucose reference range applies only to samples taken after fasting for at least 8 hours.   BUN 12 8 - 23 mg/dL   Creatinine, Ser 1.02 0.61 - 1.24 mg/dL   Calcium 8.7 (L) 8.9 - 10.3 mg/dL   GFR, Estimated >60 >60 mL/min    Comment: (NOTE) Calculated using the CKD-EPI Creatinine Equation (2021)    Anion gap 4 (L) 5 - 15    Comment: Performed at [facility] [address], [place], [place] [address]   No results found.     Medical Problem List and Plan: 1.  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  IV acyclovir completed today 06/25/2021  -patient may shower  -ELOS/Goals: 10-14 days/supervision  Admit to CIR 2.  Antithrombotics: -DVT/anticoagulation: Eliquis Pharmaceutical: Other (comment)  -antiplatelet therapy: N/A 3. Pain Management: Lyrica 50 mg twice daily, tramadol as needed  Monitor with increased exertion 4. Mood: Provide emotional support  -antipsychotic agents: N/A 5. Neuropsych: This patient is capable of making decisions on his own behalf. 6. Skin/Wound Care: Routine skin checks 7. Fluids/Electrolytes/Nutrition: Routine in and outs  CMP ordered 8.  Viral leg rash.  All lesions fully crusted.  Isolation discontinued 9.  Hyponatremia.  Felt to be secondary dehydration resolved after IV fluids.    CMP ordered for tomorrow 10.  Persistent atrial fibrillation.   Cardiac rate controlled.  Continue Coreg 6.25 mg twice daily/Eliquis  Monitor with increased activity 11.  Diastolic congestive heart failure.  Ejection fraction 35 to 40% echocardiogram 2020.  Filed Weights   06/14/21 0407  Weight: 67 kg   12.  Hypertension.  Norvasc 10 mg daily  Monitor his increase mobility 13.  BPH/mild hydronephrosis.  Flomax 0.4 mg daily... Maintain Foley tube until follow-up outpatient  urology.  Patient failed voiding trial 06/21/2021 14.  Hyperlipidemia: Lipitor 15.  L1 superior endplate compression fracture/mild edema with spinous process T4-T5, sacral insufficiency fracture.  Supportive care 16.  5 mm right lower lobe pulmonary nodule versus intrapulmonary lymph node.  Repeat noncontrast CT chest 12 months   Cathlyn Parsons, PA-C 06/25/2021  I have personally performed a face to face diagnostic evaluation, including, but not limited to relevant history and physical exam findings, of this patient and developed relevant assessment and plan.  Additionally, I have reviewed and concur with the physician assistant's documentation above.  Delice Lesch, MD, ABPMR

## 2021-06-25 NOTE — Progress Notes (Signed)
PMR Admission Coordinator Pre-Admission Assessment   Patient: Parker Odonnell. is an 87 y.o., male MRN: [id number] DOB: 1931/12/07 Height: _0  (172.7 cm) Weight: 67 kg   Insurance Information HMO:     PPO: yes     PCP:      IPA:      80/20:      OTHER:  PRIMARY: [employer] Medicare      Policy#: [id number]      Subscriber: pt CM Name: Lattie Haw      Phone#: [phone]     Fax#: [phone] Pre-Cert#: [id number] [place] for CIR provided by Lattie Haw with [employer] Expeditd Appeals with updates due to fax listed above on 9/13      Employer:  Benefits:  Phone #: [phone]     Name:  Eff. Date: 10/19/20     Deduct: $200 (met $200)      Out of Pocket Max: $2200 (met $200)      Life Max: n/a CIR: 100%       SNF: 100% once deductible met Outpatient: 100% once deductible met     Co-Pay:  Home Health: 100% once deductible met      Co-Pay:  DME: 100%     Co-Pay:  Providers:  SECONDARY:       Policy#:      Phone#:    Development worker, community:       Phone#:    The Therapist, art Information Summary" for patients in Inpatient Rehabilitation Facilities with attached "Privacy Act [place] Records" was provided and verbally reviewed with: Patient   Emergency Contact Information Contact Information       Name Relation Home Work Mobile    [place] Daughter [phone]   [phone]    Grabiel, Schmutz [phone]   [phone]    [facility] Daughter     [phone]    Raylon, Lamson     [phone]           Current Medical History  Patient Admitting Diagnosis: VZV meningitis    History of Present Illness: Pt is a 87 y/o male with persistent afrib, on eliquis, HFrEF, and HTN who presented to [facility] on 8/11 with BLE weakness and purpuric RLE rash.  Pt was admitted for extensive workup.  Neuro consulted and recommended CT head, MRI/A brain, MRI whole spine, a wide range of serological studies, and an LP, all of which was negative except MRI spine showing compression fractures of L1,  L sacrum, and compression fracture of T4/5, and LP which showed +VZV in CSF.  Diagnosed with VZV meningitis.  ID following and recommended 14 days of IV Acyclovir, contact/airborne precautions through 9/3, IV fluids, and therapy.  Therapy evaluations were completed and pt was recommended for CIR.    Patient's medical record from [facility] has been reviewed by the rehabilitation admission coordinator and physician.   Past Medical History      Past Medical History:  Diagnosis Date   Arthritis     ASCVD (arteriosclerotic cardiovascular disease)      single vessel   Cancer (HCC)     CHF (congestive heart failure) (HCC)     Coronary atherosclerosis of native coronary artery     DNR (do not resuscitate) 06/10/2021   Dyslipidemia     ED (erectile dysfunction)     GERD (gastroesophageal reflux disease)      hx of   History of kidney stones     Hypertension     Inguinal hernia  Left bundle branch block      chronic   Postsurgical percutaneous transluminal coronary angioplasty status     Shingles        Has the patient had major surgery during 100 days prior to admission? Yes   Family History   family history includes Arthritis in his father; Hypertension in his paternal uncle.   Current Medications   Current Facility-Administered Medications:    acetaminophen (TYLENOL) tablet 650 mg, 650 mg, Oral, Q6H PRN, 650 mg at 06/21/21 2243 **OR** [DISCONTINUED] acetaminophen (TYLENOL) suppository 650 mg, 650 mg, Rectal, Q6H PRN, Karmen Bongo, MD   acyclovir (ZOVIRAX) 660 mg in dextrose 5 % 100 mL IVPB, 660 mg, Intravenous, Q12H, Rozann Lesches, RPH, Last Rate: 113.2 mL/hr at 06/25/21 0851, 660 mg at 06/25/21 0851   amLODipine (NORVASC) tablet 10 mg, 10 mg, Oral, Daily, Thurnell Lose, MD, 10 mg at 06/25/21 0847   apixaban (ELIQUIS) tablet 2.5 mg, 2.5 mg, Oral, BID, Thurnell Lose, MD, 2.5 mg at 06/25/21 0847   atorvastatin (LIPITOR) tablet 40 mg, 40 mg, Oral, QHS, Karmen Bongo,  MD, 40 mg at 06/24/21 2106   bisacodyl (DULCOLAX) EC tablet 5 mg, 5 mg, Oral, Daily PRN, Karmen Bongo, MD   carvedilol (COREG) tablet 6.25 mg, 6.25 mg, Oral, BID WC, Thurnell Lose, MD, 6.25 mg at 06/25/21 0847   Chlorhexidine Gluconate Cloth 2 % PADS 6 each, 6 each, Topical, Daily, Thurnell Lose, MD, 6 each at 06/25/21 0848   docusate sodium (COLACE) capsule 100 mg, 100 mg, Oral, BID, Karmen Bongo, MD, 100 mg at 06/25/21 0847   feeding supplement (ENSURE ENLIVE / ENSURE PLUS) liquid 237 mL, 237 mL, Oral, TID BM, Thurnell Lose, MD, 237 mL at 06/25/21 0848   hydrALAZINE (APRESOLINE) injection 10 mg, 10 mg, Intravenous, Q6H PRN, Thurnell Lose, MD   lip balm (CARMEX) ointment 1 application, 1 application, Topical, PRN, Bonnielee Haff, MD   liver oil-zinc oxide (DESITIN) 40 % ointment, , Topical, TID PRN, Bonnielee Haff, MD   multivitamin with minerals tablet 1 tablet, 1 tablet, Oral, Daily, Ghimire, Henreitta Leber, MD, 1 tablet at 06/25/21 0847   mupirocin cream (BACTROBAN) 2 %, , Topical, BID, Karmen Bongo, MD, Given at 06/25/21 0848   [DISCONTINUED] ondansetron (ZOFRAN) tablet 4 mg, 4 mg, Oral, Q6H PRN **OR** ondansetron (ZOFRAN) injection 4 mg, 4 mg, Intravenous, Q6H PRN, Karmen Bongo, MD   polyethylene glycol (MIRALAX / GLYCOLAX) packet 17 g, 17 g, Oral, Daily PRN, Karmen Bongo, MD   polyvinyl alcohol (LIQUIFILM TEARS) 1.4 % ophthalmic solution 1 drop, 1 drop, Both Eyes, BID PRN, Heloise Purpura, RPH   pregabalin (LYRICA) capsule 50 mg, 50 mg, Oral, BID, Lora Havens, MD, 50 mg at 06/25/21 0847   tamsulosin (FLOMAX) capsule 0.4 mg, 0.4 mg, Oral, Daily, Ghimire, Shanker M, MD, 0.4 mg at 06/25/21 0847   traMADol (ULTRAM) tablet 50 mg, 50 mg, Oral, Q6H PRN, Karmen Bongo, MD, 50 mg at 06/25/21 1233   Patients Current Diet:  Diet Order                  Diet regular Room service appropriate? No; Fluid consistency: Thin  Diet effective now                          Precautions / Restrictions Precautions Precautions: Fall Precaution Comments: unsteady Restrictions Weight Bearing Restrictions: No    Has the patient had 2 or  more falls or a fall with injury in the past year? Yes   Prior Activity Level Limited Community (1-2x/wk): living in [place], was completely independent until early Aug when he started needing to use AE for mobility/ADLs, and was requiring 24/7 supervision/assist from comfort keepers   Prior Functional Level Self Care: Did the patient need help bathing, dressing, using the toilet or eating? Independent   Indoor Mobility: Did the patient need assistance with walking from room to room (with or without device)? Independent   Stairs: Did the patient need assistance with internal or external stairs (with or without device)? Independent   Functional Cognition: Did the patient need help planning regular tasks such as shopping or remembering to take medications? Independent   Patient Information Are you of Hispanic, Latino/a,or Spanish origin?: A. No, not of Hispanic, Latino/a, or Spanish origin What is your race?: A. White Do you need or want an interpreter to communicate with a doctor or health care staff?: 0. No   Patient's Response To:  Health Literacy and Transportation Is the patient able to respond to health literacy and transportation needs?: Yes Health Literacy - How often do you need to have someone help you when you read instructions, pamphlets, or other written material from your doctor or pharmacy?: Sometimes In the past 12 months, has lack of transportation kept you from medical appointments or from getting medications?: No In the past 12 months, has lack of transportation kept you from meetings, work, or from getting things needed for daily living?: No   Home Assistive Devices / [place] Devices/Equipment: Radio producer (specify quad or straight) Home Equipment: Walker - 2 wheels, Grab  bars - tub/shower, Grab bars - toilet, Shower seat   Prior Device Use: Indicate devices/aids used by the patient prior to current illness, exacerbation or injury?  At baseline does not use DME except occasionally a RW.    Current Functional Level Cognition   Overall Cognitive Status: Within Functional Limits for tasks assessed Orientation Level: Disoriented to time, Disoriented to place General Comments: pt a&ox4    Extremity Assessment (includes Sensation/Coordination)   Upper Extremity Assessment: Defer to OT evaluation  Lower Extremity Assessment: Defer to PT evaluation RLE Deficits / Details: hip/knee extensors 3+; ankle DF 5/5, PF 4/5 RLE Sensation: history of peripheral neuropathy (hypersensitive Rt foot) LLE Deficits / Details: hip/knee extensors 4/5, ankle DF 5/5, PF 4/5 LLE Sensation: history of peripheral neuropathy     ADLs   Overall ADL's : Needs assistance/impaired Eating/Feeding: Set up, Sitting Grooming: Wash/dry hands, Wash/dry face, Oral care, Minimal assistance, Sitting Grooming Details (indicate cue type and reason): Performed seated in recliner with assistance to load toothbrush Upper Body Bathing: Set up, Sitting Lower Body Bathing: Maximal assistance, Sit to/from stand Lower Body Bathing Details (indicate cue type and reason):  (Patient stood at RW to perform perineal cleaning with max assist) Upper Body Dressing : Minimal assistance, Sitting Lower Body Dressing: Moderate assistance, Sit to/from stand Toilet Transfer: Minimal assistance, BSC, Stand-pivot Functional mobility during ADLs: Minimal assistance, Rolling walker General ADL Comments: Unsafe seated on EOB     Mobility   Overal bed mobility: Needs Assistance Bed Mobility: Supine to Sit Supine to sit: Supervision Sit to supine: Min guard General bed mobility comments:  (Patient up in recliner with complaints of discomfort)     Transfers   Overall transfer level: Needs assistance Equipment used:  Rolling walker (2 wheeled) Transfer via Lift Equipment: Stedy Transfers: Sit to/from [employer] to  Stand: Mod assist, +2 physical assistance (using walker mod assist +2, min assist +2 with stedy) Stand pivot transfers: Max assist General transfer comment: performed one sit to stand at rw and 3 from stedy.  very fearful of falling     Ambulation / Gait / Stairs / Wheelchair Mobility   Ambulation/Gait Ambulation/Gait assistance: Min assist, +2 safety/equipment Gait Distance (Feet): 2 Feet Assistive device: Rolling walker (2 wheeled) Gait Pattern/deviations: Step-to pattern General Gait Details: pregait/weight shifting in standing in stedy with right knee buckling     Posture / Balance Dynamic Sitting Balance Sitting balance - Comments: seated in recliner Balance Overall balance assessment: Needs assistance Sitting-balance support: No upper extremity supported, Feet supported Sitting balance-Leahy Scale: Fair Sitting balance - Comments: seated in recliner Postural control: Right lateral lean Standing balance support: Bilateral upper extremity supported Standing balance-Leahy Scale: Poor Standing balance comment: reliant on RW/UE support     Special needs/care consideration Continuous Drip IV  acyclovir 113.2 ml/hr and Skin rash to BLEs    Previous Home Environment (from acute therapy documentation) Living Arrangements: Alone Available Help at Discharge: Family, Available PRN/intermittently, Other (Comment) (facility staff assist with showers and meds.) Type of Home: Apartment Home Layout: One level Home Access: Level entry, Elevator Bathroom Shower/Tub: Multimedia programmer: Handicapped height Bathroom Accessibility: Yes How Accessible: Accessible via wheelchair Home Care Services: No Additional Comments: Patient lives in [place] with no other levels of care. Recommending post-acute rehab   Discharge Living Setting Plans for Discharge Living Setting: Other (Comment)  ([facility]) Type of Home at Discharge: [place] Name at Discharge: [place]: One level Does the patient have any problems obtaining your medications?: No   Social/Family/Support Systems Anticipated Caregiver: hired caregivers, [employer] Anticipated Ambulance person Information: daughter, [name], is primary contact: [phone] Ability/Limitations of Caregiver: family works Building control surveyor Availability: 24/7 Discharge Plan Discussed with Primary Caregiver: Yes Is Caregiver In Agreement with Plan?: Yes Does Caregiver/Family have Issues with Lodging/Transportation while Pt is in Rehab?: No   Goals Patient/Family Goal for Rehab: PT/OT supervision, SLP min assist Expected length of stay: 7-10 days Additional Information: plan to return to [place] with hired caregivers Pt/Family Agrees to Admission and willing to participate: Yes Program Orientation Provided & Reviewed with Pt/Caregiver Including Roles  & Responsibilities: Yes  Barriers to Discharge: Insurance for SNF coverage   Decrease burden of Care through IP rehab admission: n/a   Possible need for SNF placement upon discharge: Not anticipated   Patient Condition: I have reviewed medical records from [facility], spoken with CM, and patient and daughter. I met with patient at the bedside and discussed via phone for inpatient rehabilitation assessment.  Patient will benefit from ongoing PT, OT, and SLP, can actively participate in 3 hours of therapy a day 5 days of the week, and can make measurable gains during the admission.  Patient will also benefit from the coordinated team approach during an Inpatient Acute Rehabilitation admission.  The patient will receive intensive therapy as well as Rehabilitation physician, nursing, social worker, and care management interventions.  Due to bladder management, bowel management, safety, skin/wound care, disease  management, medication administration, pain management, and patient education the patient requires 24 hour a day rehabilitation nursing.  The patient is currently min assist with mobility and basic ADLs.  Discharge setting and therapy post discharge at home with home health is anticipated.  Patient has agreed to participate in the Acute Inpatient Rehabilitation Program  and will admit today.   Preadmission Screen Completed By:  Michel Santee, PT, DPT 06/25/2021 2:23 PM ______________________________________________________________________   Discussed status with Dr. Posey Pronto on 06/25/21   at 2:23 PM  and received approval for admission today.   Admission Coordinator:  Michel Santee, PT,DPT  time 2:23 PM Sudie Grumbling 06/25/21     Assessment/Plan: Diagnosis: VZV meningitis  Does the need for close, 24 hr/day Medical supervision in concert with the patient's rehab needs make it unreasonable for this patient to be served in a less intensive setting? Yes Co-Morbidities requiring supervision/potential complications: persistent afrib, HFrEF, and HTN (monitor and provide prns in accordance with increased physical exertion and pain) Due to bladder management, safety, skin/wound care, disease management, medication administration, and patient education, does the patient require 24 hr/day rehab nursing? Yes Does the patient require coordinated care of a physician, rehab nurse, PT, OT, and SLP to address physical and functional deficits in the context of the above medical diagnosis(es)? Yes Addressing deficits in the following areas: balance, endurance, locomotion, strength, transferring, bathing, dressing, toileting, cognition, and psychosocial support Can the patient actively participate in an intensive therapy program of at least 3 hrs of therapy 5 days a week? Yes The potential for patient to make measurable gains while on inpatient rehab is excellent Anticipated functional outcomes upon discharge from inpatient  rehab: supervision PT, supervision OT, modified independent and supervision SLP Estimated rehab length of stay to reach the above functional goals is: 13-17 days. Anticipated discharge destination: Home 10. Overall Rehab/Functional Prognosis: good     MD Signature: Delice Lesch, MD, ABPMR

## 2021-06-25 NOTE — Progress Notes (Signed)
Inpatient Rehab Admissions Coordinator:    I have insurance approval and a bed available for pt to admit to CIR today. Dr. Waldron Labs in agreement.  Will let pt/family and TOC team know.   Shann Medal, PT, DPT Admissions Coordinator [phone] 06/25/21  2:19 PM

## 2021-06-25 NOTE — Discharge Instructions (Signed)
Management per CIR 

## 2021-06-25 NOTE — Progress Notes (Signed)
Occupational Therapy Treatment Patient Details Name: Parker Odonnell. MRN: [id number] DOB: September 27, 1932 Today's Date: 06/25/2021    History of present illness 87 y.o. male  presented 06/10/21 with rash, weakness. 8/14 at [facility] for same; ?shingles, started acyclovir and discharged. Rash did not improve. multiple tests run negative;  VZV PCR is now +ve and being treated for viral infection.  PMH significant of CAD; CHF; HLD; afib on Eliquis; and HTN   OT comments  Patient sitting up in recliner with daughter present.  Patient had complaints of discomfort on buttocks, particularly the left side.  Patient has new blue cushion and patient stood from recliner to rw with mod assist +2 and was unsafe to maintain.  Patient stood with steady with min assist +2 and performed 3 stands and was able to place blue cushion.  Patient stated increased comfort.  Acute OT to continue to follow.  Follow Up Recommendations  CIR    Equipment Recommendations  Wheelchair (measurements OT);Wheelchair cushion (measurements OT)    Recommendations for Other Services Rehab consult    Precautions / Restrictions Precautions Precautions: Fall Precaution Comments: unsteady       Mobility Bed Mobility               General bed mobility comments:  (Patient up in recliner with complaints of discomfort)    Transfers Overall transfer level: Needs assistance Equipment used: Rolling walker (2 wheeled) Transfers: Sit to/from Stand Sit to Stand: Mod assist;+2 physical assistance (using walker mod assist +2, min assist +2 with stedy)         General transfer comment: performed one sit to stand at rw and 3 from stedy.  very fearful of falling    Balance Overall balance assessment: Needs assistance Sitting-balance support: No upper extremity supported;Feet supported Sitting balance-Leahy Scale: Fair Sitting balance - Comments: seated in recliner Postural control: Right lateral lean Standing balance support:  Bilateral upper extremity supported Standing balance-Leahy Scale: Poor Standing balance comment: reliant on RW/UE support                           ADL either performed or assessed with clinical judgement   ADL                                               Vision       Perception     Praxis      Cognition Arousal/Alertness: Awake/alert Behavior During Therapy: Anxious;Restless Overall Cognitive Status: Within Functional Limits for tasks assessed                                 General Comments: pt a&ox4        Exercises Exercises: Other exercises General Exercises - Lower Extremity Long Arc Quad: AROM;Right;10 reps;Seated Hip Flexion/Marching: AROM;Both;5 reps (increases left buttock pain and further deferred) Other Exercises Other Exercises: sit to stand x 5 reps; standing for up to 60 seconds at a time; progressed to pre-gait   Shoulder Instructions       General Comments      Pertinent Vitals/ Pain       Pain Assessment: Faces Pain Score: 6  Faces Pain Scale: Hurts even more Pain Location: left buttock Pain Descriptors / Indicators: Discomfort;Tender Pain Intervention(s):  Repositioned  Home Living Family/patient expects to be discharged to:: Unsure Living Arrangements: Alone Available Help at Discharge: Family;Available PRN/intermittently;Other (Comment) (facility staff assist with showers and meds.) Type of Home: Apartment Home Access: Level entry;Elevator     Home Layout: One level     Bathroom Shower/Tub: Occupational psychologist: Handicapped height Bathroom Accessibility: Yes   Home Equipment: Walker - 2 wheels;Grab bars - tub/shower;Grab bars - toilet;Shower seat   Additional Comments: Patient lives in [place] with no other levels of care. Recommending post-acute rehab      Prior Functioning/Environment Level of Independence: Independent with assistive device(s)  Gait / Transfers Assistance  Needed: Patient walks to dinning room with RW, uses elevator from 2 nd floor to 1st floor. ADL's / Homemaking Assistance Needed: Patient independent with sink baths, facility prepars meals, assist with meds, showers  twice a week with supervision/assist as needed.   Comments: prior to recent onset of leg weakness, modified independent with mobility using cane   Frequency  Min 2X/week        Progress Toward Goals  OT Goals(current goals can now be found in the care plan section)  Progress towards OT goals: Progressing toward goals  Acute Rehab OT Goals Patient Stated Goal: find out why he is weak OT Goal Formulation: With patient Time For Goal Achievement: 06/25/21 Potential to Achieve Goals: Fair ADL Goals Pt Will Perform Grooming: sitting;standing;with min guard assist Pt Will Perform Lower Body Bathing: with min assist;sit to/from stand Pt Will Perform Lower Body Dressing: with min assist;sit to/from stand Pt Will Transfer to Toilet: with min assist;ambulating;regular height toilet  Plan Discharge plan remains appropriate    Co-evaluation                 AM-PAC OT "6 Clicks" Daily Activity     Outcome Measure   Help from another person eating meals?: None Help from another person taking care of personal grooming?: A Little Help from another person toileting, which includes using toliet, bedpan, or urinal?: A Lot Help from another person bathing (including washing, rinsing, drying)?: A Lot Help from another person to put on and taking off regular upper body clothing?: A Little Help from another person to put on and taking off regular lower body clothing?: A Lot 6 Click Score: 16    End of Session Equipment Utilized During Treatment: Gait belt;Rolling walker  OT Visit Diagnosis: Unsteadiness on feet (R26.81);Other abnormalities of gait and mobility (R26.89);Muscle weakness (generalized) (M62.81);Pain Pain - Right/Left: Left Pain - part of body:  (buttocks)    Activity Tolerance Patient limited by fatigue;Patient limited by pain   Patient Left in chair;with call bell/phone within reach;with chair alarm set;with family/visitor present   Nurse Communication Other (comment) (need for padding on buttocks)        Time: [id number] OT Time Calculation (min): 29 min  Charges: OT General Charges $OT Visit: 1 Visit OT Treatments $Therapeutic Activity: 23-37 mins  Lodema Hong, [place] 06/25/2021, 12:25 PM

## 2021-06-25 NOTE — Progress Notes (Signed)
INPATIENT REHABILITATION ADMISSION NOTE   Arrival Method: bed     Mental Orientation: A&O x2   Assessment: see flowsheets    Skin: scabs BLE, stage 2 on buttocks   IV'S: LUE   Pain: BLE   Tubes and Drains: none   Safety Measures: fall risk    Vital Signs:   Height and Weight:   Rehab Orientation:   Family: daughter at bedside    Notes:

## 2021-06-25 NOTE — H&P (Signed)
Physical Medicine and Rehabilitation Admission H&P    Chief Complaint  Patient presents with   Weakness  : HPI: [facility] Odonnell. is an 87 year old right-handed male with history of CAD, legally blind, diastolic congestive heart failure, hyperlipidemia, atrial fibrillation maintained on Eliquis as well as left hip replacement.  History taken from chart review and patient.  Patient lives alone independent living facility/[facility].  Uses a rolling walker/cane for ambulation.  He has a son and daughter in the area with good support planning on hired caregivers as needed.  He presented on 06/10/2021 with right leg pain since 05/30/21 as well as recent fall.  Denied LOC.  Noted recent rash started on valacyclovir for suspected shingles uncertain diagnosis.  Admission chemistries unremarkable except sodium 125, sedimentation rate within normal limits 15, blood cultures no growth to date, troponin negative, BNP 161.8, uric acid within normal limits.  MRI of the brain unremarkable for acute intracranial.  There was a 9 mm nodular lesion along the left lateral aspect of the medulla, indeterminate.  No associated mass-effect.  MRA of head and neck negative.  MRI thoracic lumbar spine showed acute compression fracture involving the superior endplate of L1 with up to 35% height loss without significant bony retropulsion.  Additional probable acute infarct of the left sacrum partially visualized.  Mild edema within the spinous process of T4 and T5 of uncertain etiology.  CT of the chest abdomen pelvis showed no mass or adenopathy.  There was a 5 mm right lower lobe pulmonary nodule recommend CT scan in 12 months.  MRI follow-up of the pelvis evaluation significantly limited by metallic susceptibility artifact related to left hip arthroplasty.  There was relatively focal bone marrow edema of the left aspect of S3 consistent with insufficiency fracture.  He did undergo LP CSF with leukocytosis/lymphocytes and  elevated protein.  Neurology as well as ID consulted for follow-up bilateral lower extremity weakness right more than left/purpuric right lower extremity rash.  Etiology unclear differential included infectious vasculitis paraneoplastic extensive work-up noted.  Imaging so far was unrevealing.  Autoimmune work-up negative.  Skin biopsy is pending.  Given lymphocytic pleocytosis and elevated protein on CSF, VZV PCR now positive continued on acyclovir stop date on 06/25/2021 admission isolation discontinued.  He remains on chronic Eliquis as prior to admission.  Patient with BPH mild hydronephrosis seen on CT abdomen maintain on Flomax and a Foley catheter tube was placed after failed voiding trial and would continue to maintain Foley until follow-up outpatient urology.  Therapy evaluations completed due to patient decreased functional as well as cognitive ability was admitted for a comprehensive rehab program.  Please see preadmission assessment from earlier today as well.  Review of Systems  Constitutional:  Positive for malaise/fatigue. Negative for chills and fever.  HENT:  Negative for hearing loss.   Eyes:  Negative for blurred vision and double vision.       Legally blind  Respiratory:  Negative for cough and shortness of breath.   Cardiovascular:  Negative for chest pain and palpitations.  Gastrointestinal:  Positive for constipation. Negative for heartburn, nausea and vomiting.       GERD  Genitourinary:  Negative for dysuria, flank pain and hematuria.  Musculoskeletal:  Positive for back pain, falls, joint pain and myalgias.  Skin:  Positive for rash.  Neurological:  Positive for focal weakness and weakness.  All other systems reviewed and are negative. Past Medical History:  Diagnosis Date   Arthritis  ASCVD (arteriosclerotic cardiovascular disease)    single vessel   Cancer (HCC)    CHF (congestive heart failure) (HCC)    Coronary atherosclerosis of native coronary artery    DNR (do  not resuscitate) 06/10/2021   Dyslipidemia    ED (erectile dysfunction)    GERD (gastroesophageal reflux disease)    hx of   History of kidney stones    Hypertension    Inguinal hernia    Left bundle branch block    chronic   Postsurgical percutaneous transluminal coronary angioplasty status    Shingles    Past Surgical History:  Procedure Laterality Date   [place] TEST  05/25/12   [place]   INGUINAL HERNIA REPAIR  06/15/2012   Procedure: HERNIA REPAIR INGUINAL ADULT;  Surgeon: Adin Hector, MD;  Location: [facility];  Service: General;  Laterality: Bilateral;  Bilateral Inguinal Hernia Repair with Ultrapro Mesh   MOHS SURGERY     RIGHT/LEFT HEART CATH AND CORONARY ANGIOGRAPHY N/A 11/13/2019   Procedure: RIGHT/LEFT HEART CATH AND CORONARY ANGIOGRAPHY;  Surgeon: Jettie Booze, MD;  Location: [facility];  Service: Cardiovascular;  Laterality: N/A;   TOTAL HIP ARTHROPLASTY Left 01/10/2020   Procedure: TOTAL HIP ARTHROPLASTY ANTERIOR APPROACH;  Surgeon: Gaynelle Arabian, MD;  Location: [facility];  Service: Orthopedics;  Laterality: Left;  168mn   Family History  Problem Relation Age of Onset   Arthritis Father    Hypertension Paternal Uncle    Heart attack Neg Hx    Stroke Neg Hx    Social History:  reports that he has never smoked. He has never used smokeless tobacco. He reports that he does not drink alcohol and does not use drugs. Allergies: No Known Allergies Medications Prior to Admission  Medication Sig Dispense Refill   atorvastatin (LIPITOR) 40 MG tablet TAKE ONE TABLET BY MOUTH EVERYDAY AT BEDTIME 90 tablet 3   b complex vitamins tablet Take 1 tablet by mouth daily.     carvedilol (COREG) 3.125 MG tablet TAKE ONE TABLET BY MOUTH EVERY MORNING and TAKE ONE TABLET BY MOUTH EVERYDAY AT BEDTIME 180 tablet 3   ELIQUIS 2.5 MG TABS tablet TAKE ONE TABLET BY MOUTH EVERY MORNING and  TAKE ONE TABLET BY MOUTH EVERYDAY AT BEDTIME 60 tablet 5   furosemide (LASIX) 20 MG tablet Take 1 tablet (20 mg total) by mouth daily. 30 tablet 0   losartan (COZAAR) 50 MG tablet Take 50 mg by mouth daily.     Multiple Vitamin (MULTIVITAMIN WITH MINERALS) TABS tablet Take 1 tablet by mouth every other day. [facility]  Spectravite Multivitamin     Multiple Vitamins-Minerals (OCUVITE-LUTEIN PO) Take 1 capsule by mouth daily.     Polyethyl Glycol-Propyl Glycol 0.4-0.3 % SOLN Apply 1 drop to eye 2 (two) times daily as needed (dry eyes).      spironolactone (ALDACTONE) 25 MG tablet Take 0.5 tablets (12.5 mg total) by mouth daily. 30 tablet 0   lisinopril (PRINIVIL,ZESTRIL) 20 MG tablet Take 1 tablet (20 mg total) by mouth daily. (Patient not taking: No sig reported) 30 tablet 0   methocarbamol (ROBAXIN) 500 MG tablet Take 1 tablet (500 mg total) by mouth every 6 (six) hours as needed for muscle spasms. (Patient not taking: No sig reported) 40 tablet 0   traMADol (ULTRAM) 50 MG tablet Take 1 tablet (50 mg total) by mouth every 6 (six) hours as  needed for up to 10 doses. (Patient not taking: No sig reported) 10 tablet 0    Drug Regimen Review Drug regimen was reviewed and remains appropriate with no significant issues identified  Home: Home Living Family/patient expects to be discharged to:: Unsure Living Arrangements: Alone Available Help at Discharge: Family, Available PRN/intermittently, Other (Comment) (facility staff assist with showers and meds.) Type of Home: Apartment Home Access: Level entry, Elevator Home Layout: One level Bathroom Shower/Tub: Multimedia programmer: Handicapped height Bathroom Accessibility: Yes Home Equipment: Environmental consultant - 2 wheels, Grab bars - tub/shower, Grab bars - toilet, Shower seat Additional Comments: Patient lives in [place] with no other levels of care. Recommending post-acute rehab   Functional History: Prior Function Level of Independence: Independent with  assistive device(s) Gait / Transfers Assistance Needed: Patient walks to dinning room with RW, uses elevator from 2 nd floor to 1st floor. ADL's / Homemaking Assistance Needed: Patient independent with Odonnell baths, facility prepars meals, assist with meds, showers  twice a week with supervision/assist as needed. Comments: prior to recent onset of leg weakness, modified independent with mobility using cane  Functional Status:  Mobility: Bed Mobility Overal bed mobility: Needs Assistance Bed Mobility: Supine to Sit Supine to sit: Supervision Sit to supine: Min guard General bed mobility comments:  (Patient up in recliner with complaints of discomfort) Transfers Overall transfer level: Needs assistance Equipment used: Rolling walker (2 wheeled) Transfer via Lift Equipment: Stedy Transfers: Sit to/from Stand Sit to Stand: Mod assist, +2 physical assistance (using walker mod assist +2, min assist +2 with stedy) Stand pivot transfers: Max assist General transfer comment: performed one sit to stand at rw and 3 from stedy.  very fearful of falling Ambulation/Gait Ambulation/Gait assistance: Min assist, +2 safety/equipment Gait Distance (Feet): 2 Feet Assistive device: Rolling walker (2 wheeled) Gait Pattern/deviations: Step-to pattern General Gait Details: pregait/weight shifting in standing in stedy with right knee buckling    ADL: ADL Overall ADL's : Needs assistance/impaired Eating/Feeding: Set up, Sitting Grooming: Wash/dry hands, Wash/dry face, Oral care, Minimal assistance, Sitting Grooming Details (indicate cue type and reason): Performed seated in recliner with assistance to load toothbrush Upper Body Bathing: Set up, Sitting Lower Body Bathing: Maximal assistance, Sit to/from stand Lower Body Bathing Details (indicate cue type and reason):  (Patient stood at RW to perform perineal cleaning with max assist) Upper Body Dressing : Minimal assistance, Sitting Lower Body Dressing:  Moderate assistance, Sit to/from stand Toilet Transfer: Minimal assistance, BSC, Stand-pivot Functional mobility during ADLs: Minimal assistance, Rolling walker General ADL Comments: Unsafe seated on EOB  Cognition: Cognition Overall Cognitive Status: Within Functional Limits for tasks assessed Orientation Level: Disoriented to time, Disoriented to place Cognition Arousal/Alertness: Awake/alert Behavior During Therapy: Anxious, Restless Overall Cognitive Status: Within Functional Limits for tasks assessed General Comments: pt a&ox4  Physical Exam: Blood pressure 113/66, pulse 77, temperature 97.8 F (36.6 C), temperature source Oral, resp. rate 18, height '5\' 8"'$  (1.727 m), weight 67 kg, SpO2 95 %. Physical Exam Vitals reviewed.  Constitutional:      Appearance: Normal appearance.  HENT:     Head: Normocephalic and atraumatic.     Right Ear: External ear normal.     Left Ear: External ear normal.     Nose: Nose normal.  Eyes:     General:        Right eye: No discharge.        Left eye: No discharge.     Extraocular Movements: Extraocular movements intact.  Cardiovascular:     Rate and Rhythm: Normal rate and regular rhythm.  Pulmonary:     Effort: Pulmonary effort is normal. No respiratory distress.     Breath sounds: Normal breath sounds. No stridor.  Abdominal:     General: There is distension.     Tenderness: There is no abdominal tenderness.  Genitourinary:    Comments: +Foley Musculoskeletal:        General: No tenderness.     Cervical back: Normal range of motion and neck supple.     Comments: LE edema  Skin:    General: Skin is warm and dry.  Neurological:     Mental Status: He is alert.     Comments: Patient is alert sitting up in chair, slumped to the side.   Follows verbal commands.   Provides name and age and place however he could not recall the name of his independent living facility. He is a limited medical historian. Motor: Bilateral upper  extremities: 4/5 proximal distal Bilateral lower extremities: Hip flexion, knee extension 2+/5, ankle dorsiflexion 4/5  Psychiatric:        Speech: Speech is tangential.        Cognition and Memory: Cognition is impaired.    Results for orders placed or performed during the hospital encounter of 06/10/21 (from the past 48 hour(s))  Basic metabolic panel     Status: Abnormal   Collection Time: 06/24/21  2:49 AM  Result Value Ref Range   Sodium 132 (L) 135 - 145 mmol/L   Potassium 3.7 3.5 - 5.1 mmol/L   Chloride 103 98 - 111 mmol/L   CO2 25 22 - 32 mmol/L   Glucose, Bld 99 70 - 99 mg/dL    Comment: Glucose reference range applies only to samples taken after fasting for at least 8 hours.   BUN 12 8 - 23 mg/dL   Creatinine, Ser 1.02 0.61 - 1.24 mg/dL   Calcium 8.7 (L) 8.9 - 10.3 mg/dL   GFR, Estimated >60 >60 mL/min    Comment: (NOTE) Calculated using the CKD-EPI Creatinine Equation (2021)    Anion gap 4 (L) 5 - 15    Comment: Performed at [facility] [address], [place], [place] [address]   No results found.     Medical Problem List and Plan: 1.  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  IV acyclovir completed today 06/25/2021  -patient may shower  -ELOS/Goals: 10-14 days/supervision  Admit to CIR 2.  Antithrombotics: -DVT/anticoagulation: Eliquis Pharmaceutical: Other (comment)  -antiplatelet therapy: N/A 3. Pain Management: Lyrica 50 mg twice daily, tramadol as needed  Monitor with increased exertion 4. Mood: Provide emotional support  -antipsychotic agents: N/A 5. Neuropsych: This patient is capable of making decisions on his own behalf. 6. Skin/Wound Care: Routine skin checks 7. Fluids/Electrolytes/Nutrition: Routine in and outs  CMP ordered 8.  Viral leg rash.  All lesions fully crusted.  Isolation discontinued 9.  Hyponatremia.  Felt to be secondary dehydration resolved after IV fluids.    CMP ordered for tomorrow 10.  Persistent atrial fibrillation.   Cardiac rate controlled.  Continue Coreg 6.25 mg twice daily/Eliquis  Monitor with increased activity 11.  Diastolic congestive heart failure.  Ejection fraction 35 to 40% echocardiogram 2020.  Filed Weights   06/14/21 0407  Weight: 67 kg   12.  Hypertension.  Norvasc 10 mg daily  Monitor his increase mobility 13.  BPH/mild hydronephrosis.  Flomax 0.4 mg daily... Maintain Foley tube until follow-up outpatient  urology.  Patient failed voiding trial 06/21/2021 14.  Hyperlipidemia: Lipitor 15.  L1 superior endplate compression fracture/mild edema with spinous process T4-T5, sacral insufficiency fracture.  Supportive care 16.  5 mm right lower lobe pulmonary nodule versus intrapulmonary lymph node.  Repeat noncontrast CT chest 12 months   Cathlyn Parsons, PA-C 06/25/2021  I have personally performed a face to face diagnostic evaluation, including, but not limited to relevant history and physical exam findings, of this patient and developed relevant assessment and plan.  Additionally, I have reviewed and concur with the physician assistant's documentation above.  Delice Lesch, MD, ABPMR  The patient's status has not changed. Any changes from the pre-admission screening or documentation from the acute chart are noted above.   Delice Lesch, MD, ABPMR

## 2021-06-25 NOTE — Progress Notes (Signed)
Physical Therapy Treatment Patient Details Name: [place] Odonnell. MRN: [id number] DOB: 11/07/1931 Today's Date: 06/25/2021    History of Present Illness 87 y.o. male  presented 06/10/21 with rash, weakness. 8/14 at [facility] for same; ?shingles, started acyclovir and discharged. Rash did not improve. multiple tests run negative;  VZV PCR is now +ve and being treated for viral infection.  PMH significant of CAD; CHF; HLD; afib on Eliquis; and HTN    PT Comments    Patient in 10/10 pain due to left buttock ulcer and reclined sitting posture on arrival. Very anxious related to pain and fear of falling. Willing to work with therapy "and do what I can." Initial standing efforts with RW to place cushion under his buttocks and assess if pt able to tolerate upright sitting without pushing/leaning to his right. Pt with inability to fully extend knees in standing with RW and extremely anxious re: falling and therefore used stedy lift for remaining sit to stands (coming up from recliner height each time with varying +1 mod to +2 min assist (pt appears to put forth better effort with 1 person on either side of him and he is less fearful of falling). May benefit from use of Sara Plus lift equipment for pre-gait and walking.     Follow Up Recommendations  CIR     Equipment Recommendations  Rolling walker with 5" wheels    Recommendations for Other Services Rehab consult;OT consult     Precautions / Restrictions Precautions Precautions: Fall Precaution Comments: per son, 8/14 after legs began feeling weak    Mobility  Bed Mobility               General bed mobility comments: pt up in chair on arrival; reclined and leaning very far over to right side due to left buttock pain; asked secretary to order blue air cushion for chair; end of session, sitting upright with pillow under bottom and pillows/blankets behind his back to promote ischial instead of sacral sitting with good success and less pain     Transfers Overall transfer level: Needs assistance   Transfers: Sit to/from Stand Sit to Stand: Min assist;+2 physical assistance         General transfer comment: pt very fearful of falling and although stood x 1 with stedy with +1 mod assist; pt preferred 2 person assist. Stood x 3 with +2 min assist with either stedy or RW  Ambulation/Gait Ambulation/Gait assistance: Min assist;+2 safety/equipment           General Gait Details: pregait/weight shifting in standing in stedy with right knee buckling   Stairs             Wheelchair Mobility    Modified Rankin (Stroke Patients Only)       Balance Overall balance assessment: Needs assistance Sitting-balance support: No upper extremity supported;Feet supported Sitting balance-Leahy Scale: Fair Sitting balance - Comments: demonstrated fair balance once his anxiety decreased Postural control: Right lateral lean Standing balance support: Bilateral upper extremity supported Standing balance-Leahy Scale: Poor Standing balance comment: reliant on RW/UE support                            Cognition Arousal/Alertness: Awake/alert Behavior During Therapy: Anxious;Restless Overall Cognitive Status: Within Functional Limits for tasks assessed  General Comments: pt a&ox4      Exercises General Exercises - Lower Extremity Long Arc Quad: AROM;Right;10 reps;Seated Hip Flexion/Marching: AROM;Both;5 reps (increases left buttock pain and further deferred) Other Exercises Other Exercises: sit to stand x 5 reps; standing for up to 60 seconds at a time; progressed to pre-gait    General Comments        Pertinent Vitals/Pain Pain Assessment: Faces Faces Pain Scale: Hurts worst Pain Location: left buttock Pain Descriptors / Indicators: Discomfort;Moaning;Tender Pain Intervention(s): Limited activity within patient's tolerance;Monitored during session;Repositioned     Home Living Family/patient expects to be discharged to:: Unsure Living Arrangements: Alone Available Help at Discharge: Family;Available PRN/intermittently;Other (Comment) (facility staff assist with showers and meds.) Type of Home: Apartment Home Access: Level entry;Elevator   Home Layout: One level Home Equipment: Walker - 2 wheels;Grab bars - tub/shower;Grab bars - toilet;Shower seat Additional Comments: Patient lives in [place] with no other levels of care. Recommending post-acute rehab    Prior Function Level of Independence: Independent with assistive device(s)  Gait / Transfers Assistance Needed: Patient walks to dinning room with RW, uses elevator from 2 nd floor to 1st floor. ADL's / Homemaking Assistance Needed: Patient independent with sink baths, facility prepars meals, assist with meds, showers  twice a week with supervision/assist as needed. Comments: prior to recent onset of leg weakness, modified independent with mobility using cane   PT Goals (current goals can now be found in the care plan section) Acute Rehab PT Goals Patient Stated Goal: find out why he is weak Time For Goal Achievement: 06/25/21 Potential to Achieve Goals: Good Progress towards PT goals: Not progressing toward goals - comment (anxiety and pain limiting progress; ?try Clarise Cruz for pre-gait)    Frequency    Min 3X/week      PT Plan      Co-evaluation              AM-PAC PT "6 Clicks" Mobility   Outcome Measure  Help needed turning from your back to your side while in a flat bed without using bedrails?: None Help needed moving from lying on your back to sitting on the side of a flat bed without using bedrails?: A Little Help needed moving to and from a bed to a chair (including a wheelchair)?: Total Help needed standing up from a chair using your arms (e.g., wheelchair or bedside chair)?: Total Help needed to walk in hospital room?: Total Help needed climbing 3-5 steps with a railing? :  Total 6 Click Score: 11    End of Session Equipment Utilized During Treatment: Gait belt Activity Tolerance: Patient limited by pain Patient left: in chair;with call bell/phone within reach;with chair alarm set Nurse Communication: Mobility status;Need for lift equipment;Other (comment) (stedy; has ulcer left buttock; ordered air cushion for chair; repositioned with feet down for upright sitting to reduce pressure on buttock/sacrum) PT Visit Diagnosis: Other abnormalities of gait and mobility (R26.89);Muscle weakness (generalized) (M62.81);History of falling (Z91.81)     Time: [id number] PT Time Calculation (min) (ACUTE ONLY): 41 min  Charges:  $Therapeutic Exercise: 8-22 mins $Therapeutic Activity: 23-37 mins                      Arby Barrette, PT Pager [phone]    Rexanne Mano 06/25/2021, 10:09 AM

## 2021-06-25 NOTE — Discharge Summary (Signed)
Physician Discharge Summary  Parker Odonnell. IM:[id number] DOB: [birth year] DOA: 06/10/2021  PCP: Lawerance Cruel, MD  Admit date: 06/10/2021 Discharge date: 06/25/2021  Admitted From: Home Disposition:  CIR  Recommendations for Outpatient Follow-up:  Please obtain BMP/CBC in 3 days Last dose of IV acyclovir will be this evening, and this is will finish total of 14 days of IV acyclovir with no further treatment needed after this evening dose. Please do voiding trial when patient is more ambulatory. 5 mm right lower lobe pulmonary nodule versus intrapulmonary lymph node: Repeat noncontrast CT chest in 12 months to reassess.  Discharge Condition:Stable CODE STATUS:DNR Diet recommendation: Regular   Brief/Interim Summary:  Patient is a 87 y.o. male persistent atrial fibrillation on Eliquis, HFrEF, HTN who presented to the hospital with right leg weakness and a purpuric right leg rash.  Given broad differential diagnosis (infectious/autoimmune/paraneoplastic/structural)-admitted to the [facility] service for further evaluation.  see below .   Significant events: 8/14>> presented to ED with right leg weakness/pain-fall-found to have rash-thought to have shingles-started on Valtrex and discharged  8/23>> admit to [facility] for right leg weakness/purpuric right leg rash eval/management 8/24>> punch biopsy by surgery 8/25>> LP-CSF w leukocytosis (lymphocytes) and elevated protein-Acyclovir started.   Significant radiological studies:  8/14>> CT head: No acute intracranial findings. 8/25>> MRI brain: No evidence of encephalitis/paraneoplastic disease/intracranial infection. 8/25>> MRA brain: No evidence of CNS vasculitis-no major stenosis. 8/25>> MRI thoracic/lumbar-spine: Compression fracture superior endplate of L1, acute fracture of left sacrum, mild edema within spinous process of T4/T5.  No MRI changes to suggest myelopathy. 8/27>> CT chest: 5 mm nodule in right major fissure-possibly  intrapulmonary lymph node 8/27>> CT abdomen: No mass/adenopathy-Marked prostate enlargement-mild bilateral hydronephrosis.   Significant serological studies: 8/23>> TSH: Within normal limit 8/24>> RA factor: Negative 8/24>> ANA: Negative 8/24>> ds DNA antibody/anti-smooth antibody: Negative 8/24>> Sjogren's A/Sjogren's B antibody: Negative 8/24>> anti-smooth muscle antibody: Negative 8/24>> ANCA antibodies: Negative 8/24>> cardiolipin antibodies: Negative 8/24>> Lyme antibody/PCR: Negative 8/24>> C3/C4/total complement: Normal limits 8/24>> C1 esterase inhibitor: Normal limit 8/24>> SPEP/UPEP: No M spike noted.  No evidence for monoclonal antibodies 8/24>> Lupus anticoagulant: Not detected 8/27>>Vitamin B12: 998   Microbiology data: 8/23>> COVID PCR: Negative 8/23>> blood culture: No growth 8/24>> acute hepatitis panel: Negative 8/24>> HIV: Negative 8/25>> blood culture: No growth 8/25>> CSF cryptococcal antigen: Negative 8/25>> CSF culture: negative 8/25>> CSF VDRL: Non reactive 8/25>> CSF VZV PCR: POSITIVE 8/25>> CSF HSV PCR: Negative 8/25>>CSF ARUP Encephalitis panel:negative 8/26>>RPR:Non reactive   Pathology data: 8/24>> skin biopsy: PERIVASCULAR DERMATITIS 8/25>> CSF cytology: negative for malignancy   Antimicrobial therapy: Acyclovir: 8/25>>9/7   Procedures : 8/24>> punch biopsy performed by general surgery:   8/25>> lumbar puncture by neurology   Consults: Neuro, ID, general surgery   Bilateral lower extremity weakness, right more than left/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  Imaging studies  unrevealing.  Autoimmune work-up negative.  Skin biopsy with  PERIVASCULAR DERMATITIS. No masses or lymphadenopathy is seen on CT chest/abdomen.  Given lymphocytic pleocytosis and elevated protein on CSF, HSV PCR  negative.  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  treated with  acyclovir with stop date 06/25/21. Off Isolation. - ARUP meningitis and encephalitis panel negative - DC to CIR   Viral Leg rash - all lesions are old and fully crusted, DC'd isolation 06/21/21.   Hyponatremia: Due to dehydration resolved after IV fluids, now had  some SIADH, better after Lasix x 1. Stable.   Persistent atrial fibrillation: Rate controlled-on Coreg-Eliquis.   HFrEF: Volume status is stable-continue to hold diuretics for now.  EF 35 to 40% based on echocardiogram done in 2020.   History of CAD-s/p remote PCI: No anginal symptoms.   Hyperlipidemia: Continue statin   Essential hypertension: BP stable-stopped ARB due to AKI, Coreg and Norvasc combination.   Large prostate with mild hydronephrosis seen on CT abdomen: Renal function stable-started on Flomax +  Foley with outpt Urology follow up.  Failed Foley removal trial on 06/21/2021.   L1 superior endplate compression fracture/mild edema within spinous process of T4/T5, Sacral insufficiency fracture : Seen incidentally on MRI imaging.  Supportive care, no pain.   5 mm right lower lobe pulmonary nodule versus intrapulmonary lymph node: Repeat noncontrast CT chest in 12 months to reassess.   Mild AKI - IVF, as on Acylovir, continue Foley. DC'd ARB. Resolved. Discharge Diagnoses:  Principal Problem:   Rash Active Problems:   Dyslipidemia   Essential hypertension   Atrial flutter (HCC)   Weakness   Chronic combined systolic (congestive) and diastolic (congestive) heart failure (HCC)   DNR (do not resuscitate)   Weakness of both lower extremities    Discharge Instructions  Discharge Instructions     Discharge instructions   Complete by: As directed    Management per CIR   Increase activity slowly   Complete by: As directed       Allergies as of 06/25/2021   No Known Allergies      Medication List     STOP taking these medications    furosemide 20 MG  tablet Commonly known as: LASIX   lisinopril 20 MG tablet Commonly known as: ZESTRIL   losartan 50 MG tablet Commonly known as: COZAAR   spironolactone 25 MG tablet Commonly known as: ALDACTONE       TAKE these medications    acetaminophen 325 MG tablet Commonly known as: TYLENOL Take 2 tablets (650 mg total) by mouth every 6 (six) hours as needed for mild pain (or Fever >/= 101).   acyclovir 660 mg in dextrose 5 % 100 mL Inject 660 mg into the vein every 12 (twelve) hours. LAST dose todAY AT 8:00 pm   amLODipine 10 MG tablet Commonly known as: NORVASC Take 1 tablet (10 mg total) by mouth daily. Start taking on: June 26, 2021   atorvastatin 40 MG tablet Commonly known as: LIPITOR TAKE ONE TABLET BY MOUTH EVERYDAY AT BEDTIME   b complex vitamins tablet Take 1 tablet by mouth daily.   carvedilol 6.25 MG tablet Commonly known as: COREG Take 1 tablet (6.25 mg total) by mouth 2 (two) times daily with a meal. What changed:  medication strength See the new instructions.   Eliquis 2.5 MG Tabs tablet Generic drug: apixaban TAKE ONE TABLET BY MOUTH EVERY MORNING and TAKE ONE TABLET BY MOUTH EVERYDAY AT BEDTIME   feeding supplement Liqd Take 237 mLs by mouth 3 (three) times daily between meals.   methocarbamol 500 MG tablet Commonly known as: ROBAXIN Take 1 tablet (500 mg total) by mouth every 6 (six) hours as needed for muscle spasms.   multivitamin with minerals Tabs tablet Take 1 tablet by mouth every other day. [facility]  Spectravite Multivitamin   OCUVITE-LUTEIN PO Take 1 capsule by mouth daily.   Polyethyl Glycol-Propyl Glycol 0.4-0.3 % Soln Apply 1 drop to eye 2 (two) times daily as needed (dry eyes).   pregabalin  50 MG capsule Commonly known as: LYRICA Take 1 capsule (50 mg total) by mouth 2 (two) times daily.   tamsulosin 0.4 MG Caps capsule Commonly known as: FLOMAX Take 1 capsule (0.4 mg total) by mouth daily. Start taking on: June 26, 2021    traMADol 50 MG tablet Commonly known as: ULTRAM Take 1 tablet (50 mg total) by mouth every 12 (twelve) hours as needed for moderate pain. What changed:  when to take this reasons to take this        No Known Allergies    Procedures/Studies: CT HEAD WO CONTRAST (5MM)  Result Date: 06/01/2021 CLINICAL DATA:  Recent fall with head injury, currently using Eliquis EXAM: CT HEAD WITHOUT CONTRAST TECHNIQUE: Contiguous axial images were obtained from the base of the skull through the vertex without intravenous contrast. COMPARISON:  None. FINDINGS: Brain: No evidence of acute infarction, hemorrhage, hydrocephalus, extra-axial collection or mass lesion/mass effect. Chronic atrophic and ischemic changes are noted. Vascular: No hyperdense vessel or unexpected calcification. Skull: Normal. Negative for fracture or focal lesion. Sinuses/Orbits: No acute finding. Other: None. IMPRESSION: Chronic atrophic and ischemic changes without acute abnormality. Electronically Signed   By: Inez Catalina M.D.   On: 06/01/2021 22:44   MR ANGIO HEAD WO CONTRAST  Result Date: 06/13/2021 CLINICAL DATA:  Initial evaluation for CNS vasculitis, bilateral lower extremity weakness. EXAM: MRA HEAD WITHOUT CONTRAST TECHNIQUE: Angiographic images of the Circle of Willis were acquired using MRA technique without intravenous contrast. COMPARISON:  None available. FINDINGS: Anterior circulation: Examination mildly degraded by motion artifact. Visualized distal cervical segments of the internal carotid arteries are patent with antegrade flow. Petrous, cavernous, and supraclinoid segments patent without stenosis or other abnormality. A1 segments patent bilaterally. Possible short-segment fenestration at the left aspect of the anterior communicating artery complex noted. Anterior communicating artery complex otherwise unremarkable. Both anterior cerebral arteries patent to their distal aspects without stenosis. No M1 stenosis or  occlusion. Normal MCA bifurcations. Distal MCA branches perfused and grossly symmetric. Posterior circulation: Left vertebral artery strongly dominant and widely patent to the vertebrobasilar junction. Left PICA patent. Hypoplastic right vertebral artery largely terminates in PICA. Basilar patent to its distal aspect without stenosis. Superior cerebellar arteries patent bilaterally. Both PCAs primarily supplied via the basilar well perfused to their distal aspects. Anatomic variants: Hypoplastic right vertebral artery terminates in PICA. Other: No intracranial aneurysm. No significant vascular irregularity or beading to suggest CNS vasculitis, although the distal small vessels are somewhat limited assessment due to motion artifact. IMPRESSION: Normal intracranial MRA. No MRI evidence for CNS vasculitis. Electronically Signed   By: Jeannine Boga M.D.   On: 06/13/2021 02:36   MR BRAIN W WO CONTRAST  Result Date: 06/13/2021 CLINICAL DATA:  Initial evaluation for right worse than left lower extremity weakness over 3 weeks. Evaluate for CNS vasculitis versus paraneoplastic disease versus infection. EXAM: MRI HEAD WITHOUT AND WITH CONTRAST TECHNIQUE: Multiplanar, multiecho pulse sequences of the brain and surrounding structures were obtained without and with intravenous contrast. CONTRAST:  6.16m GADAVIST GADOBUTROL 1 MMOL/ML IV SOLN COMPARISON:  None available. FINDINGS: Brain: Mild diffuse prominence of the CSF containing spaces compatible generalized age-related cerebral atrophy. No significant cerebral white matter disease for age. No other focal parenchymal signal abnormality to suggest acute encephalitis, including limbic encephalitis or other paraneoplastic process. No abnormal foci of restricted of restricted diffusion to suggest acute or subacute ischemia. Gray-white matter differentiation maintained. No encephalomalacia to suggest chronic cortical infarction. No foci of susceptibility artifact to  suggest acute or chronic intracranial hemorrhage. No made of a 9 mm nodular lesion along the left lateral aspect of the medulla, immediately adjacent to the foramen of Luschka (series 9, image 4). This lesion demonstrates no appreciable enhancement following contrast administration. No PICA aneurysm seen on corresponding MRA. 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 No associated mass effect. No other mass lesion, midline shift or mass effect. Mild ventricular prominence related to global parenchymal volume loss without hydrocephalus. No extra-axial fluid collection. Pituitary gland and suprasellar region within normal limits. Midline structures intact and normal. No abnormal enhancement. No findings to suggest meningitis or other CNS infection. Vascular: Major intracranial vascular flow voids are maintained. Skull and upper cervical spine: Degenerative thickening noted about the tectorial membrane without significant stenosis. Craniocervical junction otherwise unremarkable. Bone marrow signal intensity within normal limits. No focal marrow replacing lesion. No scalp soft tissue abnormality. Sinuses/Orbits: Globes and orbital soft tissues within normal limits. Mild scattered mucosal thickening noted within the ethmoidal air cells. Paranasal sinuses are otherwise clear. Small right greater than left mastoid effusions noted. Visualized nasopharynx unremarkable. Inner ear structures grossly within normal limits. Other: None. IMPRESSION: 1. No acute intracranial abnormality. Specifically, no MRI evidence for encephalitis, paraneoplastic disease, or intracranial infection. 2. 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 No associated mass effect. This is felt to be incidental in nature and of doubtful significance. 3. Otherwise normal brain MRI for age. 4. Small right greater than left mastoid effusions.  Electronically Signed   By: Jeannine Boga M.D.   On: 06/13/2021 02:30   MR THORACIC SPINE W WO CONTRAST  Result Date: 06/13/2021 CLINICAL DATA:  Initial evaluation for myelopathy, acute or progressive. EXAM: MRI THORACIC AND LUMBAR SPINE WITHOUT AND WITH CONTRAST TECHNIQUE: Multiplanar and multiecho pulse sequences of the thoracic and lumbar spine were obtained without and with intravenous contrast. CONTRAST:  6.66m GADAVIST GADOBUTROL 1 MMOL/ML IV SOLN COMPARISON:  None available. FINDINGS: MRI THORACIC SPINE FINDINGS Alignment: Mild dextroscoliosis. Alignment otherwise normal with preservation of the normal thoracic kyphosis. No listhesis. Vertebrae: Vertebral body height maintained without acute or chronic fracture. Bone marrow signal intensity diffusely heterogeneous without worrisome osseous lesion. Mild edema noted within the spinous processes of T4 and T5, of uncertain etiology or significance (series 1, images 7, 6). No visible discrete osseous lesion, fracture, or other abnormality evident by MRI. The surrounding soft tissues are within normal limits. Mild edema and enhancement noted within the inferior endplate of T624THL favored discogenic and reactive in nature. No other abnormal marrow edema or enhancement within the thoracic spine. Cord: Normal signal and morphology. No abnormal enhancement. No epidural collections. Paraspinal and other soft tissues: Visualized paraspinous soft tissues demonstrate no acute finding. Small layering bilateral pleural effusions noted, left slightly larger than right. 5.2 cm exophytic cyst noted extending from the left kidney. Disc levels: T4-5: Small central disc protrusion indents the ventral thecal sac (series 22, image 14). Posterior element hypertrophy. No significant stenosis. T6-7: Small central disc protrusion indents the ventral thecal sac (series 22, image 20). Mild flattening of the ventral cord without cord signal changes. Left-sided facet hypertrophy.  No spinal stenosis. T7-8: Small central disc protrusion indents the ventral thecal sac (series 22, image 23). Mild flattening of the ventral cord without cord signal changes. Mild posterior element hypertrophy. No significant stenosis. T12-L1: Diffuse disc bulge with bilateral facet hypertrophy. No significant spinal stenosis. Mild bilateral foraminal narrowing.  No other significant disc pathology within the thoracic spine for age. Multilevel facet hypertrophy noted throughout the mid and lower thoracic spine. No significant spinal stenosis. MRI LUMBAR SPINE FINDINGS Segmentation:  Examination degraded by motion artifact. Standard segmentation. Lowest well-formed disc space labeled the L5-S1 level. Alignment: 3 mm anterolisthesis of L3 on L4, with trace retrolisthesis of L4 on L5. Findings chronic and facet mediated. Underlying levoscoliosis. Vertebrae: Acute compression fracture seen involving the superior endplate of L1. Associated height loss measures up to 35% without significant bony retropulsion. Additionally, there is a suspected left sacral fracture, seen only on sagittal projection (series 2, image 14). Otherwise, vertebral body height maintained with no other acute or chronic fracture. Visualized bone marrow signal intensity diffusely heterogeneous without discrete worrisome osseous lesion. No other abnormal marrow edema or enhancement. Conus medullaris: Extends to the T12-L1 level and appears normal. No visible abnormal enhancement on this motion degraded exam. Paraspinal and other soft tissues: Paraspinous soft tissues demonstrate no acute finding. Prominent distension of the partially visualized urinary bladder noted. Disc levels: L1-2: Disc bulge with disc desiccation and intervertebral disc space narrowing. Mild to moderate bilateral facet hypertrophy. Resultant moderate spinal stenosis, with mild to moderate bilateral L1 foraminal narrowing. L2-3: Mild disc bulge. Moderate bilateral facet  hypertrophy. No significant spinal stenosis. Foramina remain patent. L3-4: Anterolisthesis. Degenerative intervertebral disc space narrowing with diffuse disc bulge and disc desiccation. Disc bulging eccentric to the right. Moderate bilateral facet hypertrophy. Resultant moderate spinal stenosis. Mild to moderate right with mild left L3 foraminal narrowing. L4-5: Disc bulge with disc desiccation. Moderate bilateral facet hypertrophy. Mild narrowing of the lateral recesses bilaterally. Central canal remains patent. Mild to moderate left L4 foraminal narrowing. Right neural foramen remains patent. L5-S1: Degenerative intervertebral disc space narrowing with disc desiccation and diffuse disc bulge. Reactive endplate spurring. Mild left greater than right facet hypertrophy. No significant spinal stenosis. Foramina remain patent. IMPRESSION: 1. Acute compression fracture involving the superior endplate of L1 with up to 35% height loss without significant bony retropulsion. 2. Additional probable acute fracture of the left sacrum, partially visualized. Follow-up with dedicated CT and/or MRI of the sacrum could be performed for further evaluation as warranted. 3. Mild edema within the spinous processes of T4 and T5, of uncertain etiology, but could reflect additional subtle fractures given the above findings. Correlation with physical exam for possible pain at this location recommended. 4. Normal MRI appearance of the thoracic spinal cord, conus medullaris, and cauda equina. No cord signal changes to suggest myelopathy. No abnormal enhancement. 5. Moderate lumbar spondylosis with resultant moderate spinal stenosis at L1-2 and L3-4. Additional more mild spondylosis elsewhere within the thoracolumbar spine as above. No other significant stenosis or neural impingement. 6. Prominent distension of the urinary bladder. Clinical correlation for possible urinary retention recommended. Electronically Signed   By: Jeannine Boga M.D.   On: 06/13/2021 03:17   MR Lumbar Spine W Wo Contrast  Result Date: 06/13/2021 CLINICAL DATA:  Initial evaluation for myelopathy, acute or progressive. EXAM: MRI THORACIC AND LUMBAR SPINE WITHOUT AND WITH CONTRAST TECHNIQUE: Multiplanar and multiecho pulse sequences of the thoracic and lumbar spine were obtained without and with intravenous contrast. CONTRAST:  6.37m GADAVIST GADOBUTROL 1 MMOL/ML IV SOLN COMPARISON:  None available. FINDINGS: MRI THORACIC SPINE FINDINGS Alignment: Mild dextroscoliosis. Alignment otherwise normal with preservation of the normal thoracic kyphosis. No listhesis. Vertebrae: Vertebral body height maintained without acute or chronic fracture. Bone marrow signal intensity diffusely heterogeneous without worrisome osseous lesion. Mild  edema noted within the spinous processes of T4 and T5, of uncertain etiology or significance (series 1, images 7, 6). No visible discrete osseous lesion, fracture, or other abnormality evident by MRI. The surrounding soft tissues are within normal limits. Mild edema and enhancement noted within the inferior endplate of 624THL, favored discogenic and reactive in nature. No other abnormal marrow edema or enhancement within the thoracic spine. Cord: Normal signal and morphology. No abnormal enhancement. No epidural collections. Paraspinal and other soft tissues: Visualized paraspinous soft tissues demonstrate no acute finding. Small layering bilateral pleural effusions noted, left slightly larger than right. 5.2 cm exophytic cyst noted extending from the left kidney. Disc levels: T4-5: Small central disc protrusion indents the ventral thecal sac (series 22, image 14). Posterior element hypertrophy. No significant stenosis. T6-7: Small central disc protrusion indents the ventral thecal sac (series 22, image 20). Mild flattening of the ventral cord without cord signal changes. Left-sided facet hypertrophy. No spinal stenosis. T7-8: Small central  disc protrusion indents the ventral thecal sac (series 22, image 23). Mild flattening of the ventral cord without cord signal changes. Mild posterior element hypertrophy. No significant stenosis. T12-L1: Diffuse disc bulge with bilateral facet hypertrophy. No significant spinal stenosis. Mild bilateral foraminal narrowing. No other significant disc pathology within the thoracic spine for age. Multilevel facet hypertrophy noted throughout the mid and lower thoracic spine. No significant spinal stenosis. MRI LUMBAR SPINE FINDINGS Segmentation:  Examination degraded by motion artifact. Standard segmentation. Lowest well-formed disc space labeled the L5-S1 level. Alignment: 3 mm anterolisthesis of L3 on L4, with trace retrolisthesis of L4 on L5. Findings chronic and facet mediated. Underlying levoscoliosis. Vertebrae: Acute compression fracture seen involving the superior endplate of L1. Associated height loss measures up to 35% without significant bony retropulsion. Additionally, there is a suspected left sacral fracture, seen only on sagittal projection (series 2, image 14). Otherwise, vertebral body height maintained with no other acute or chronic fracture. Visualized bone marrow signal intensity diffusely heterogeneous without discrete worrisome osseous lesion. No other abnormal marrow edema or enhancement. Conus medullaris: Extends to the T12-L1 level and appears normal. No visible abnormal enhancement on this motion degraded exam. Paraspinal and other soft tissues: Paraspinous soft tissues demonstrate no acute finding. Prominent distension of the partially visualized urinary bladder noted. Disc levels: L1-2: Disc bulge with disc desiccation and intervertebral disc space narrowing. Mild to moderate bilateral facet hypertrophy. Resultant moderate spinal stenosis, with mild to moderate bilateral L1 foraminal narrowing. L2-3: Mild disc bulge. Moderate bilateral facet hypertrophy. No significant spinal stenosis.  Foramina remain patent. L3-4: Anterolisthesis. Degenerative intervertebral disc space narrowing with diffuse disc bulge and disc desiccation. Disc bulging eccentric to the right. Moderate bilateral facet hypertrophy. Resultant moderate spinal stenosis. Mild to moderate right with mild left L3 foraminal narrowing. L4-5: Disc bulge with disc desiccation. Moderate bilateral facet hypertrophy. Mild narrowing of the lateral recesses bilaterally. Central canal remains patent. Mild to moderate left L4 foraminal narrowing. Right neural foramen remains patent. L5-S1: Degenerative intervertebral disc space narrowing with disc desiccation and diffuse disc bulge. Reactive endplate spurring. Mild left greater than right facet hypertrophy. No significant spinal stenosis. Foramina remain patent. IMPRESSION: 1. Acute compression fracture involving the superior endplate of L1 with up to 35% height loss without significant bony retropulsion. 2. Additional probable acute fracture of the left sacrum, partially visualized. Follow-up with dedicated CT and/or MRI of the sacrum could be performed for further evaluation as warranted. 3. Mild edema within the spinous processes of T4 and T5,  of uncertain etiology, but could reflect additional subtle fractures given the above findings. Correlation with physical exam for possible pain at this location recommended. 4. Normal MRI appearance of the thoracic spinal cord, conus medullaris, and cauda equina. No cord signal changes to suggest myelopathy. No abnormal enhancement. 5. Moderate lumbar spondylosis with resultant moderate spinal stenosis at L1-2 and L3-4. Additional more mild spondylosis elsewhere within the thoracolumbar spine as above. No other significant stenosis or neural impingement. 6. Prominent distension of the urinary bladder. Clinical correlation for possible urinary retention recommended. Electronically Signed   By: Jeannine Boga M.D.   On: 06/13/2021 03:17   MR PELVIS  WO CONTRAST  Result Date: 06/16/2021 CLINICAL DATA:  Pelvic fracture EXAM: MRI PELVIS WITHOUT CONTRAST TECHNIQUE: Multiplanar multisequence MR imaging of the pelvis was performed. No intravenous contrast was administered. COMPARISON:  CT chest abdomen pelvis, 06/14/2021 FINDINGS: Urinary Tract: Severely distended urinary bladder, measuring at least 19 cm. Bowel:  Unremarkable visualized pelvic bowel loops. Vascular/Lymphatic: No pathologically enlarged lymph nodes. No significant vascular abnormality seen. Reproductive:  Prostatomegaly with median lobe hypertrophy. Other:  None. Musculoskeletal: No suspicious bone lesions identified. Evaluation of the pelvis is significantly limited by left hip total arthroplasty and associated metallic susceptibility artifact, which particularly limits fat saturation and assessment for fracture of the left iliac bone. There is relatively focal bone marrow edema of the left aspect of the S3 segment (series 3, image 22). 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 IMPRESSION: 1. Evaluation of the pelvis is significantly limited by metallic susceptibility artifact related to left hip arthroplasty, which particularly limits fat saturation and assessment for fracture of the left iliac bone. 2. Within this limitation, there is relatively focal bone marrow edema of the left aspect of the S3 segment, consistent with insufficiency fracture. No other evidence of pelvic fracture or traumatic bone marrow edema. 3. 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 4. Severely distended urinary bladder, measuring at least 19 cm. Correlate for urinary retention. 5. Prostatomegaly with median lobe hypertrophy. Electronically Signed   By: Eddie Candle M.D.   On: 06/16/2021 16:04   CT CHEST ABDOMEN PELVIS W CONTRAST  Result Date: 06/14/2021 CLINICAL DATA:  L1 and sacral fractures, possible paraneoplastic syndrome EXAM:  CT CHEST, ABDOMEN, AND PELVIS WITH CONTRAST TECHNIQUE: Multidetector CT imaging of the chest, abdomen and pelvis was performed following the standard protocol during bolus administration of intravenous contrast. CONTRAST:  34m OMNIPAQUE IOHEXOL 350 MG/ML SOLN COMPARISON:  Thoracolumbar MRI 06/12/2021, and previous FINDINGS: CT CHEST FINDINGS Cardiovascular: Mild cardiomegaly with biatrial enlargement. Scattered coronary calcifications. Aortic Atherosclerosis (ICD10-170.0). Mediastinum/Nodes: No mass or adenopathy. Lungs/Pleura: No pleural effusion. No pneumothorax. Partially calcified pleural-based 4 mm granuloma, anterior right upper lobe. 5 mm angular nodule adjacent to the right major fissure possibly intrapulmonary lymph node but nonspecific. Coarse scarring or atelectasis in the lung bases. Musculoskeletal: Shoulder DJD left worse than right. Probable chronic bilateral rib fracture deformities. No acute fracture or worrisome bone lesion. CT ABDOMEN PELVIS FINDINGS Hepatobiliary: No focal liver abnormality is seen. Status post cholecystectomy. No biliary dilatation. Pancreas: Unremarkable. No pancreatic ductal dilatation or surrounding inflammatory changes. Spleen: Normal in size without focal abnormality. Adrenals/Urinary Tract: 4.9 cm probable cyst, upper pole left kidney. There is mild bilateral hydronephrosis and ureterectasis without any definite urolithiasis. The urinary bladder is distended above the level of the umbilicus. Stomach/Bowel: Small hiatal hernia. The stomach is incompletely distended. Small bowel decompressed. Appendix not discretely identified. No pericecal  inflammatory/edematous changes. Innumerable descending and sigmoid diverticula without significant adjacent inflammatory change. Vascular/Lymphatic: Scattered calcified aortoiliac plaque without aneurysm. Portal vein patent. No abdominal or pelvic adenopathy localized. Reproductive: Marked prostate enlargement. Other: No ascites.  No  free air. Musculoskeletal: L1 superior endplate subacute compression fracture deformity with approximally the 30% loss of height anteriorly, no retropulsion. Spondylitic changes in the lower lumbar spine. Left hip arthroplasty hardware resulting in streak artifact degrading portions of the scan. Right hip DJD. IMPRESSION: 1. No mass or adenopathy. 2. Marked prostate enlargement with distended urinary bladder, which may account for the bilateral hydronephrosis and ureterectasis in the absence of urolithiasis. 3. Descending and sigmoid diverticulosis 4. 5 mm right lower lobe pulmonary nodule. No follow-up needed if patient is low-risk. Non-contrast chest CT can be considered in 12 months if patient is high-risk. This recommendation follows the consensus statement: Guidelines for Management of Incidental Pulmonary Nodules Detected on CT Images: From the Fleischner Society 2017; Radiology 2017; 284:228-243. Electronically Signed   By: Lucrezia Europe M.D.   On: 06/14/2021 13:52   DG Chest Port 1 View  Result Date: 06/11/2021 CLINICAL DATA:  Bilateral lower extremity weakness EXAM: PORTABLE CHEST 1 VIEW COMPARISON:  Chest x-ray dated October 24th 2021 FINDINGS: Normal cardiac contours. Tortuosity of the thoracic aorta. Elevation of the left hemidiaphragm. Unchanged left basilar reticular opacities, likely scarring. No new parenchymal process. Blunting of the left costophrenic angle, possibly due to pleural thickening versus trace pleural effusion, unchanged from prior. No large pleural effusion or pneumothorax. IMPRESSION: No active disease. Electronically Signed   By: Yetta Glassman M.D.   On: 06/11/2021 08:44   DG Knee Complete 4 Views Right  Result Date: 06/01/2021 CLINICAL DATA:  Recent fall with right knee pain, initial encounter EXAM: RIGHT KNEE - COMPLETE 4+ VIEW COMPARISON:  None. FINDINGS: Tricompartmental degenerative changes are noted. No acute fracture or dislocation is noted. No joint effusion is seen.  IMPRESSION: Degenerative change without acute bony abnormality. Electronically Signed   By: Inez Catalina M.D.   On: 06/01/2021 23:00   DG Hip Unilat With Pelvis 2-3 Views Right  Result Date: 06/01/2021 CLINICAL DATA:  Recent fall with right leg pain, initial encounter EXAM: DG HIP (WITH OR WITHOUT PELVIS) 3V RIGHT COMPARISON:  None. FINDINGS: Pelvic ring is intact. Left hip replacement is noted. Significant degenerative changes of the right hip joint are seen. No acute fracture or dislocation is noted. No soft tissue abnormality is seen. IMPRESSION: Degenerative change of the right hip joint. No acute bony abnormality noted. Electronically Signed   By: Inez Catalina M.D.   On: 06/01/2021 23:01   VAS [place] LOWER EXTREMITY VENOUS (DVT)  Result Date: 06/15/2021  Lower Venous DVT Study Patient Name:  Chanon Croes.  Date of Exam:   06/15/2021 Medical Rec #: [id number]          Accession #:    [id number] Date of Birth: 04-09-1932         Patient Gender: M Patient Age:   64 years Exam Location:  [facility] Procedure:      VAS [place] LOWER EXTREMITY VENOUS (DVT) Referring Phys: Lesleigh Noe --------------------------------------------------------------------------------  Indications: Swelling.  Comparison Study: No prior studies. Performing Technologist: Darlin Coco RDMS, RVT  Examination Guidelines: A complete evaluation includes B-mode imaging, spectral Doppler, color Doppler, and power Doppler as needed of all accessible portions of each vessel. Bilateral testing is considered an integral part of a complete examination. Limited examinations for reoccurring indications may  be performed as noted. The reflux portion of the exam is performed with the patient in reverse Trendelenburg.  +---------+---------------+---------+-----------+----------+--------------+ RIGHT    CompressibilityPhasicitySpontaneityPropertiesThrombus Aging +---------+---------------+---------+-----------+----------+--------------+  CFV      Full           Yes      Yes                                 +---------+---------------+---------+-----------+----------+--------------+ SFJ      Full                                                        +---------+---------------+---------+-----------+----------+--------------+ FV Prox  Full                                                        +---------+---------------+---------+-----------+----------+--------------+ FV Mid   Full                                                        +---------+---------------+---------+-----------+----------+--------------+ FV DistalFull                                                        +---------+---------------+---------+-----------+----------+--------------+ PFV      Full                                                        +---------+---------------+---------+-----------+----------+--------------+ POP      Full           Yes      Yes                                 +---------+---------------+---------+-----------+----------+--------------+ PTV      Full                                                        +---------+---------------+---------+-----------+----------+--------------+ PERO     Full                                                        +---------+---------------+---------+-----------+----------+--------------+   +----+---------------+---------+-----------+----------+--------------+ LEFTCompressibilityPhasicitySpontaneityPropertiesThrombus Aging +----+---------------+---------+-----------+----------+--------------+ CFV Full           Yes      Yes                                 +----+---------------+---------+-----------+----------+--------------+  Summary: RIGHT: - There is no evidence of deep vein thrombosis in the lower extremity.  - No cystic structure found in the popliteal fossa.  LEFT: - No evidence of common femoral vein obstruction.  *See table(s) above  for measurements and observations. Electronically signed by Monica Martinez MD on 06/15/2021 at 2:27:00 PM.    Final       Subjective: Patient denies any fever, chills, chest pain or shortness of breath  Discharge Exam: Vitals:   06/25/21 0729 06/25/21 1111  BP: (!) 151/67 113/66  Pulse: 70 77  Resp: 17 18  Temp: 98 F (36.7 C) 97.8 F (36.6 C)  SpO2: 96% 95%   Vitals:   06/24/21 2354 06/25/21 0321 06/25/21 0729 06/25/21 1111  BP: (!) 118/53 (!) 149/63 (!) 151/67 113/66  Pulse: 68 69 70 77  Resp: '18 16 17 18  '$ Temp: 98 F (36.7 C)  98 F (36.7 C) 97.8 F (36.6 C)  TempSrc: Oral  Oral Oral  SpO2: 94% 93% 96% 95%  Weight:      Height:        General: Pt is alert, awake, not in acute distress, frail, deconditioned. Cardiovascular: RRR, S1/S2 +, no rubs, no gallops Respiratory: CTA bilaterally, no wheezing, no rhonchi Abdominal: Soft, NT, ND, bowel sounds + Extremities: no edema, no cyanosis    The results of significant diagnostics from this hospitalization (including imaging, microbiology, ancillary and laboratory) are listed below for reference.     Microbiology: No results found for this or any previous visit (from the past 240 hour(s)).   Labs: BNP (last 3 results) Recent Labs    06/10/21 1310 06/18/21 0140 06/19/21 0320  BNP 161.8* 106.3* [id number]   Basic Metabolic Panel: Recent Labs  Lab 06/19/21 0320 06/20/21 0100 06/21/21 0043 06/22/21 0012 06/23/21 0045 06/24/21 0249  NA  --  133* 135 135 131* 132*  K  --  4.1 4.1 3.9 3.9 3.7  CL  --  104 105 105 102 103  CO2  --  '25 25 26 25 25  '$ GLUCOSE  --  102* 109* 147* 113* 99  BUN  --  '16 15 14 13 12  '$ CREATININE  --  1.11 1.02 0.98 0.92 1.02  CALCIUM  --  8.8* 8.8* 8.8* 8.7* 8.7*  MG 1.7  --   --   --   --   --    Liver Function Tests: Recent Labs  Lab 06/20/21 0100 06/21/21 0043 06/22/21 0012 06/23/21 0045  AST '18 20 20 20  '$ ALT '19 19 21 21  '$ ALKPHOS 101 88 95 99  BILITOT 1.0 1.2 1.1 0.8   PROT 5.3* 5.1* 5.1* 5.3*  ALBUMIN 2.7* 2.6* 2.7* 2.7*   No results for input(s): LIPASE, AMYLASE in the last 168 hours. No results for input(s): AMMONIA in the last 168 hours. CBC: Recent Labs  Lab 06/19/21 0320 06/20/21 0100 06/21/21 0043  WBC 5.3 4.4 4.3  NEUTROABS 3.6 2.8 2.8  HGB 10.3* 9.9* 9.7*  HCT 30.6* 28.9* 28.6*  MCV 101.0* 99.3 100.4*  PLT 125* 125* 122*   Cardiac Enzymes: No results for input(s): CKTOTAL, CKMB, CKMBINDEX, TROPONINI in the last 168 hours. BNP: Invalid input(s): POCBNP CBG: No results for input(s): GLUCAP in the last 168 hours. D-Dimer No results for input(s): DDIMER in the last 72 hours. Hgb A1c No results for input(s): HGBA1C in the last 72 hours. Lipid Profile No results for input(s): CHOL, HDL, LDLCALC, TRIG, CHOLHDL, LDLDIRECT in the last 72  hours. Thyroid function studies No results for input(s): TSH, T4TOTAL, T3FREE, THYROIDAB in the last 72 hours.  Invalid input(s): FREET3 Anemia work up No results for input(s): VITAMINB12, FOLATE, FERRITIN, TIBC, IRON, RETICCTPCT in the last 72 hours. Urinalysis    Component Value Date/Time   COLORURINE YELLOW 06/23/2021 0730   APPEARANCEUR HAZY (A) 06/23/2021 0730   LABSPEC 1.010 06/23/2021 0730   PHURINE 7.0 06/23/2021 0730   GLUCOSEU NEGATIVE 06/23/2021 0730   HGBUR MODERATE (A) 06/23/2021 0730   BILIRUBINUR NEGATIVE 06/23/2021 0730   KETONESUR NEGATIVE 06/23/2021 0730   PROTEINUR 30 (A) 06/23/2021 0730   UROBILINOGEN 0.2 06/13/2012 1027   NITRITE POSITIVE (A) 06/23/2021 0730   LEUKOCYTESUR LARGE (A) 06/23/2021 0730   Sepsis Labs Invalid input(s): PROCALCITONIN,  WBC,  LACTICIDVEN Microbiology No results found for this or any previous visit (from the past 240 hour(s)).   Time coordinating discharge: Over 30 minutes  SIGNED:   Phillips Climes, MD  [facility] 06/25/2021, 2:12 PM Pager   If 7PM-7AM, please contact night-coverage [email or web address] Password [email or web address]

## 2021-06-26 DIAGNOSIS — A879 Viral meningitis, unspecified: Secondary | ICD-10-CM

## 2021-06-26 DIAGNOSIS — B019 Varicella without complication: Secondary | ICD-10-CM

## 2021-06-26 DIAGNOSIS — I10 Essential (primary) hypertension: Secondary | ICD-10-CM

## 2021-06-26 DIAGNOSIS — B029 Zoster without complications: Secondary | ICD-10-CM

## 2021-06-26 DIAGNOSIS — M792 Neuralgia and neuritis, unspecified: Secondary | ICD-10-CM | POA: Diagnosis not present

## 2021-06-26 DIAGNOSIS — L8990 Pressure ulcer of unspecified site, unspecified stage: Secondary | ICD-10-CM | POA: Insufficient documentation

## 2021-06-26 DIAGNOSIS — I4819 Other persistent atrial fibrillation: Secondary | ICD-10-CM

## 2021-06-26 LAB — COMPREHENSIVE METABOLIC PANEL
ALT: 19 U/L (ref 0–44)
AST: 24 U/L (ref 15–41)
Albumin: 2.7 g/dL — ABNORMAL LOW (ref 3.5–5.0)
Alkaline Phosphatase: 88 U/L (ref 38–126)
Anion gap: 5 (ref 5–15)
BUN: 13 mg/dL (ref 8–23)
CO2: 25 mmol/L (ref 22–32)
Calcium: 8.9 mg/dL (ref 8.9–10.3)
Chloride: 103 mmol/L (ref 98–111)
Creatinine, Ser: 0.93 mg/dL (ref 0.61–1.24)
GFR, Estimated: >60 mL/min (ref >60)
Glucose, Bld: 108 mg/dL — ABNORMAL HIGH (ref 70–99)
Potassium: 3.7 mmol/L (ref 3.5–5.1)
Sodium: 133 mmol/L — ABNORMAL LOW (ref 135–145)
Total Bilirubin: 1.3 mg/dL — ABNORMAL HIGH (ref 0.3–1.2)
Total Protein: 5.4 g/dL — ABNORMAL LOW (ref 6.5–8.1)

## 2021-06-26 LAB — CBC WITH DIFFERENTIAL/PLATELET
Abs Immature Granulocytes: 0.02 10*3/uL (ref 0.00–0.07)
Basophils Absolute: 0 10*3/uL (ref 0.0–0.1)
Basophils Relative: 0 %
Eosinophils Absolute: 0.2 10*3/uL (ref 0.0–0.5)
Eosinophils Relative: 3 %
HCT: 29.7 % — ABNORMAL LOW (ref 39.0–52.0)
Hemoglobin: 10 g/dL — ABNORMAL LOW (ref 13.0–17.0)
Immature Granulocytes: 0 %
Lymphocytes Relative: 12 %
Lymphs Abs: 0.6 10*3/uL — ABNORMAL LOW (ref 0.7–4.0)
MCH: 33.8 pg (ref 26.0–34.0)
MCHC: 33.7 g/dL (ref 30.0–36.0)
MCV: 100.3 fL — ABNORMAL HIGH (ref 80.0–100.0)
Monocytes Absolute: 0.5 10*3/uL (ref 0.1–1.0)
Monocytes Relative: 9 %
Neutro Abs: 4.1 10*3/uL (ref 1.7–7.7)
Neutrophils Relative %: 76 %
Platelets: 143 10*3/uL — ABNORMAL LOW (ref 150–400)
RBC: 2.96 MIL/uL — ABNORMAL LOW (ref 4.22–5.81)
RDW: 14.5 % (ref 11.5–15.5)
WBC: 5.3 10*3/uL (ref 4.0–10.5)
nRBC: 0 % (ref 0.0–0.2)

## 2021-06-26 LAB — MISC LABCORP TEST (SEND OUT): Labcorp test code: 9985

## 2021-06-26 MED ORDER — PREGABALIN 25 MG PO CAPS
75.0000 mg | ORAL_CAPSULE | Freq: Two times a day (BID) | ORAL | Status: DC
  Administered 2021-06-26 – 2021-07-01 (×10): 75 mg via ORAL
  Filled 2021-06-26 (×10): qty 3

## 2021-06-26 NOTE — Evaluation (Signed)
Physical Therapy Assessment and Plan  Patient Details  Name: Parker Odonnell. MRN: [id number] Date of Birth: July 27, 1932  PT Diagnosis: Abnormal posture, Abnormality of gait, Cognitive deficits, Coordination disorder, Difficulty walking, Muscle spasms, Muscle weakness, and Pain in B LEs R>L Rehab Potential: Good ELOS: 2-3 weeks   Today's Date: 06/26/2021 PT Individual Time: 0175-1025 PT Individual Time Calculation (min): 70 min    Hospital Problem: Principal Problem:   VZV (varicella-zoster virus) infection Active Problems:   Pressure injury of skin   Past Medical History:  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   Cancer ([place])    CHF (congestive heart failure) ([place])    Coronary atherosclerosis of native coronary artery    DNR (do not resuscitate) 06/10/2021   Dyslipidemia    ED (erectile dysfunction)    GERD (gastroesophageal reflux disease)    hx of   History of kidney stones    Hypertension    Inguinal hernia    Left bundle branch block    chronic   Postsurgical percutaneous transluminal coronary angioplasty status    Shingles    Past Surgical History:  Past Surgical History:  Procedure Laterality Date   CARDIAC CATHETERIZATION  1998   CARDIOVASCULAR STRESS TEST  05/25/12   CHOLECYSTECTOMY  1979   CORONARY STENT PLACEMENT  1997   INGUINAL HERNIA REPAIR  06/15/2012   Procedure: HERNIA REPAIR INGUINAL ADULT;  Surgeon: Adin Hector, MD;  Location: [facility];  Service: General;  Laterality: Bilateral;  Bilateral Inguinal Hernia Repair with Ultrapro Mesh   MOHS SURGERY     RIGHT/LEFT HEART CATH AND CORONARY ANGIOGRAPHY N/A 11/13/2019   Procedure: RIGHT/LEFT HEART CATH AND CORONARY ANGIOGRAPHY;  Surgeon: Jettie Booze, MD;  Location: [facility];  Service: Cardiovascular;  Laterality: N/A;   TOTAL HIP ARTHROPLASTY Left 01/10/2020   Procedure: TOTAL HIP ARTHROPLASTY ANTERIOR  APPROACH;  Surgeon: Gaynelle Arabian, MD;  Location: [facility];  Service: Orthopedics;  Laterality: Left;  149min    Assessment & Plan Clinical Impression: Patient is a 87 y.o. year old right-handed male with history of CAD, legally blind, diastolic congestive heart failure, hyperlipidemia, atrial fibrillation maintained on Eliquis as well as left hip replacement.  History taken from chart review and patient.  Patient lives alone independent living facility/[facility].  Uses a rolling walker/cane for ambulation.  He has a son and daughter in the area with good support planning on hired caregivers as needed.  He presented on 06/10/2021 with right leg pain since 05/30/21 as well as recent fall.  Denied LOC.  Noted recent rash started on valacyclovir for suspected shingles uncertain diagnosis.  Admission chemistries unremarkable except sodium 125, sedimentation rate within normal limits 15, blood cultures no growth to date, troponin negative, BNP 161.8, uric acid within normal limits.  MRI of the brain unremarkable for acute intracranial.  There was a 9 mm nodular lesion along the left lateral aspect of the medulla, indeterminate.  No associated mass-effect.  MRA of head and neck negative.  MRI thoracic lumbar spine showed acute compression fracture involving the superior endplate of L1 with up to 35% height loss without significant bony retropulsion.  Additional probable acute infarct of the left sacrum partially visualized.  Mild edema within the spinous process of T4 and T5 of uncertain etiology.  CT of the chest abdomen pelvis showed no mass or adenopathy.  There was a 5 mm  right lower lobe pulmonary nodule recommend CT scan in 12 months.  MRI follow-up of the pelvis evaluation significantly limited by metallic susceptibility artifact related to left hip arthroplasty.  There was relatively focal bone marrow edema of the left aspect of S3 consistent with insufficiency fracture.  He did undergo LP CSF with  leukocytosis/lymphocytes and elevated protein.  Neurology as well as ID consulted for follow-up bilateral lower extremity weakness right more than left/purpuric right lower extremity rash.  Etiology unclear differential included infectious vasculitis paraneoplastic extensive work-up noted.  Imaging so far was unrevealing.  Autoimmune work-up negative.  Skin biopsy is pending.  Given lymphocytic pleocytosis and elevated protein on CSF, VZV PCR now positive continued on acyclovir stop date on 06/25/2021 admission isolation discontinued.  He remains on chronic Eliquis as prior to admission.  Patient with BPH mild hydronephrosis seen on CT abdomen maintain on Flomax and a Foley catheter tube was placed after failed voiding trial and would continue to maintain Foley until follow-up outpatient urology.  Therapy evaluations completed due to patient decreased functional as well as cognitive ability was admitted for a comprehensive rehab program.  Please see preadmission assessment from earlier today as well. Patient transferred to [facility] on 06/25/2021 .   Patient currently requires mod with mobility secondary to muscle weakness and muscle joint tightness, decreased cardiorespiratoy endurance, decreased visual acuity and decreased visual perceptual skills, decreased problem solving, decreased safety awareness, and decreased memory, and decreased sitting balance, decreased standing balance, decreased postural control, and decreased balance strategies.  Prior to hospitalization, patient was modified independent  with mobility and lived with Alone in a [facility] home.  Home access is  Level entry, Elevator.  Patient will benefit from skilled PT intervention to maximize safe functional mobility, minimize fall risk, and decrease caregiver burden for planned discharge home with 24 hour assist.  Anticipate patient will benefit from follow up [facility] at discharge.  PT - End of Session Activity Tolerance: Tolerates 10 - 20 min activity  with multiple rests Endurance Deficit: Yes Endurance Deficit Description: Multiple rest breaks within BADL tasks PT Assessment Rehab Potential (ACUTE/IP ONLY): Good PT Barriers to Discharge: Decreased caregiver support;Lack of/limited family support;Behavior PT Patient demonstrates impairments in the following area(s): Balance;Behavior;Edema;Endurance;Motor;Nutrition;Pain;Skin Integrity;Sensory;Safety;Perception PT Transfers Functional Problem(s): Bed Mobility;Bed to Chair;Car;Furniture PT Locomotion Functional Problem(s): Ambulation;Wheelchair Mobility;Stairs PT Plan PT Intensity: Minimum of 1-2 x/day ,45 to 90 minutes PT Frequency: 5 out of 7 days PT Duration Estimated Length of Stay: 2-3 weeks PT Treatment/Interventions: Ambulation/gait training;Cognitive remediation/compensation;Discharge planning;DME/adaptive equipment instruction;Functional mobility training;Pain management;Psychosocial support;Splinting/orthotics;Therapeutic Activities;UE/LE Strength taining/ROM;Visual/perceptual remediation/compensation;Wheelchair propulsion/positioning;UE/LE Coordination activities;Therapeutic Exercise;Stair training;Skin care/wound management;Patient/family education;Neuromuscular re-education;Functional electrical stimulation;Disease management/prevention;Community reintegration;Balance/vestibular training PT Transfers Anticipated Outcome(s): CGA using RW PT Locomotion Anticipated Outcome(s): CGA 75 ft using LRAD PT Recommendation Follow Up Recommendations: Home health PT Patient destination: Home Equipment Recommended: Rolling walker with 5" wheels;Wheelchair cushion (measurements);To be determined   PT Evaluation Precautions/Restrictions Precautions Precautions: Fall Precaution Comments: R>L lower extremity weakness, pt is legally blind, has foley catheter Restrictions Weight Bearing Restrictions: No Pain Pain Assessment Pain Scale: 0-10 Pain Score: 2  Pain Interference Pain  Interference Pain Effect on Sleep: 1. Rarely or not at all Pain Interference with Therapy Activities: 2. Occasionally Pain Interference with Day-to-Day Activities: 4. Almost constantly Home Living/Prior Functioning Home Living Living Arrangements: Alone Available Help at Discharge: Family;Available PRN/intermittently Type of Home: Apartment Home Access: Level entry;Elevator Home Layout: One level Bathroom Shower/Tub: Multimedia programmer: Handicapped height Bathroom Accessibility: Yes Additional Comments: Patient lives  in ILF  Lives With: Alone Prior Function Level of Independence: Independent with basic ADLs;Independent with transfers;Independent with gait;Requires assistive device for independence  Able to Take Stairs?: Yes (states he could go up stairs, but took the elevator down) Driving: No Vocation: Retired Comments: prior to recent onset of leg weakness, modified independent with mobility using cane Vision/Perception  Vision - History Ability to See in Adequate Light: 3 Highly impaired Vision - Assessment Eye Alignment: Impaired (comment) (R exophoria) Ocular Range of Motion: Within Functional Limits Alignment/Gaze Preference: Within Defined Limits Tracking/Visual Pursuits: Able to track stimulus in all quads without difficulty Saccades: Within functional limits Perception Perception: Within Functional Limits Praxis Praxis: Intact  Cognition Overall Cognitive Status: Impaired/Different from baseline Arousal/Alertness: Awake/alert Orientation Level: Oriented to person;Disoriented to place;Disoriented to situation;Oriented to time Year: 2022 Month: September Day of Week: Incorrect Memory: Impaired Memory Impairment: Retrieval deficit;Decreased recall of new information Immediate Memory Recall: Blue;Sock;Bed Memory Recall Sock: Without Cue Memory Recall Blue: With Cue Memory Recall Bed: With Cue Safety/Judgment: Impaired Sensation Sensation Light  Touch: Impaired Detail Peripheral sensation comments: hyperalgesia throughout both LEs R>L, intact light touch with testing Light Touch Impaired Details: Impaired RLE Coordination Gross Motor Movements are Fluid and Coordinated: No Fine Motor Movements are Fluid and Coordinated: No Coordination and Movement Description: decreased smoothness and accuracy Motor  Motor Motor: Abnormal postural alignment and control Motor - Skilled Clinical Observations: Generalized weakness, decreased balance strategies, significant fear of falling   Trunk/Postural Assessment  Cervical Assessment Cervical Assessment: Exceptions to [facility] (forward head posture) Thoracic Assessment Thoracic Assessment: Exceptions to [facility] (kyphosis) Lumbar Assessment Lumbar Assessment: Exceptions to [facility] (posterior pelvic tilt) Postural Control Postural Control: Deficits on evaluation (decreased/delayed)  Balance Standardized Balance Assessment Standardized Balance Assessment: Berg Balance Test Berg Balance Test Sit to Stand: Needs moderate or maximal assist to stand Standing Unsupported: Unable to stand 30 seconds unassisted Sitting with Back Unsupported but Feet Supported on Floor or Stool: Able to sit safely and securely 2 minutes Stand to Sit: Needs assistance to sit Transfers: Needs two people to assist of supervise to be safe Standing Unsupported with Eyes Closed: Needs help to keep from falling Standing Ubsupported with Feet Together: Needs help to attain position and unable to hold for 15 seconds From Standing, Reach Forward with Outstretched Arm: Loses balance while trying/requires external support From Standing Position, Pick up Object from Floor: Unable to try/needs assist to keep balance From Standing Position, Turn to Look Behind Over each Shoulder: Needs assist to keep from losing balance and falling Turn 360 Degrees: Needs assistance while turning Standing Unsupported, Alternately Place Feet on Step/Stool:  Needs assistance to keep from falling or unable to try Standing Unsupported, One Foot in Front: Loses balance while stepping or standing Standing on One Leg: Unable to try or needs assist to prevent fall Total Score: 4 Static Sitting Balance Static Sitting - Balance Support: Feet supported;Bilateral upper extremity supported Static Sitting - Level of Assistance: 4: Min assist Dynamic Sitting Balance Dynamic Sitting - Balance Support: During functional activity;Feet supported Dynamic Sitting - Level of Assistance: 4: Min Insurance risk surveyor Standing - Balance Support: Bilateral upper extremity supported Static Standing - Level of Assistance: 3: Mod assist Static Standing - Comment/# of Minutes: <10 sec in standing due to pain and fear of falling Dynamic Standing Balance Dynamic Standing - Balance Support: Bilateral upper extremity supported;During functional activity Dynamic Standing - Level of Assistance: 1: +2 Total assist Extremity Assessment  RUE Assessment RUE Assessment:  Exceptions to [facility] General Strength Comments: Generalized weakness, R UE weaker than L LUE Assessment LUE Assessment: Exceptions to [facility] General Strength Comments: generalized weakness RLE Assessment RLE Assessment: Exceptions to [facility] Passive Range of Motion (PROM) Comments: limited R knee extension, painful end range General Strength Comments: Grossly 3/5 with functional mobility LLE Assessment LLE Assessment: Exceptions to [facility] General Strength Comments: Grossly 3+/5 with functional mobility  Care Tool Care Tool Bed Mobility Roll left and right activity   Roll left and right assist level: Supervision/Verbal cueing    Sit to lying activity   Sit to lying assist level: Minimal Assistance - Patient > 75%    Lying to sitting on side of bed activity   Lying to sitting on side of bed assist level: the ability to move from lying on the back to sitting on the side of the bed with no back  support.: Minimal Assistance - Patient > 75%     Care Tool Transfers Sit to stand transfer   Sit to stand assist level: Moderate Assistance - Patient 50 - 74% Sit to stand assistive device: Walker  Chair/bed transfer   Chair/bed transfer assist level: Dependent - mechanical lift [employer])     Toilet transfer   Assist Level: Total Assistance - Patient < 25% (Stedy)    Scientist, product/process development transfer activity did not occur: Safety/medical concerns (pt with hyperalgesia with all mobility)        Care Tool Locomotion Ambulation   Assist level: 2 helpers Assistive device: Walker-rolling Max distance: 2 ft  Walk 10 feet activity Walk 10 feet activity did not occur: Safety/medical concerns       Walk 50 feet with 2 turns activity Walk 50 feet with 2 turns activity did not occur: Safety/medical concerns      Walk 150 feet activity Walk 150 feet activity did not occur: Safety/medical concerns      Walk 10 feet on uneven surfaces activity Walk 10 feet on uneven surfaces activity did not occur: Safety/medical concerns      Stairs Stair activity did not occur: Safety/medical concerns        Walk up/down 1 step activity Walk up/down 1 step or curb (drop down) activity did not occur: Safety/medical concerns        Walk up/down 4 steps activity      Walk up/down 12 steps activity Walk up/down 12 steps activity did not occur: Safety/medical concerns      Pick up small objects from floor Pick up small object from the floor (from standing position) activity did not occur: Safety/medical concerns      Wheelchair     Wheelchair activity did not occur: Safety/medical concerns (unable to tolerate sitting in a manual chair due to pain)      Wheel 50 feet with 2 turns activity Wheelchair 50 feet with 2 turns activity did not occur: Safety/medical concerns    Wheel 150 feet activity Wheelchair 150 feet activity did not occur: Safety/medical concerns      Refer to Care Plan for Long Term  Goals  SHORT TERM GOAL WEEK 1 PT Short Term Goal 1 (Week 1): Patient will perform bed mobility with supervision using hospital bed features. PT Short Term Goal 2 (Week 1): Patient will perform basic transfers with min A consistently. PT Short Term Goal 3 (Week 1): Patient will ambulate >10 feet with +2 assist.  Recommendations for other services: None   Skilled Therapeutic Intervention Evaluation completed (see details above and below) with  education on PT POC and goals and individual treatment initiated with focus on functional mobility/transfers, LE strength, dynamic standing balance/coordination, ambulation, stair navigation, simulated car transfers, and improved endurance with activity Patient provided with TIS wheelchair with Roho cushion and adjustments made to promote optimal seating posture and pressure distribution. Patient also provided with RW for use in room and therapist adjusted to proper height for patient.  Patient in bed upon PT arrival. Patient alert and agreeable to PT session. Patient reported 8/10 B lower extremity pain, R>L, with spasms during session, RN made aware and provided Tramadol during session. PT provided repositioning, rest breaks, and distraction as pain interventions throughout session.   Overall, patient with significant hyperalgia to touch and with movement with visible muscle spasms. Required significantly increased time to perform mobility and rest breaks to recovery from pain escalation with mobility throughout session. Evaluation of functional mobility limited due to elevated pain levels and decreased patient tolerance. Provided cues and education on diaphragmatic breathing, repositioning, and self massage to reduce pain/spasms throughout session.    Therapeutic Activity: Bed Mobility: Patient performed rolling R/L with supervision, somewhat limited by pain with mobility. He performed supine to/from sit with min A in a flat bed without use of bed rails.   Transfers: Patient performed sit to/from stand x1 with mod A using RW and x1 with min A with cues for forward weight shift and hand placement on RW. Patient stood <10 sec each trial due to posterior lean and decreased trunk and LE elongation in standing on R. Performed stand-step transfer bed>TIS w/c with 2 assist for increased patient confidence with mod A +2 due to patient with increased anxiety when stepping with his R leg leading to premature sitting.  Patient resting comfortably in TIS w/c >15 min before reporting sacral pain and increased lower extremity discomfort. Adjusted chair and padded foot rest for improved sitting tolerance. Assessed Roho inflation, 1-2" from seat. Patient performed sit to/from stand x3 in the [place] with min A and CGA x1 from [facility] seat, placed pillow in front of shins for improved standing tolerance. Patient stood >30 sec x2 in the stedy with improved R knee and trunk extension with R knee blocked. Utilized Stedy to return patient to the bed with min A. Recommending Stedy for transfers with nursing staff, safety plan updated and RN and NT made aware.  Instructed pt in results of PT evaluation as detailed above, PT POC, rehab potential, rehab goals, and discharge recommendations. Additionally discussed [facility]'s policies regarding fall safety and use of chair alarm and/or quick release belt. Pt verbalized understanding and in agreement. Will update pt's family members as they become available.   Patient in bed at end of session with breaks locked, bed alarm set, and all needs within reach.    Discharge Criteria: Patient will be discharged from PT if patient refuses treatment 3 consecutive times without medical reason, if treatment goals not met, if there is a change in medical status, if patient makes no progress towards goals or if patient is discharged from hospital.  The above assessment, treatment plan, treatment alternatives and goals were discussed and mutually agreed  upon: No family available/patient unable  Doreene Burke PT, DPT  06/26/2021, 12:56 PM

## 2021-06-26 NOTE — Evaluation (Signed)
Speech Language Pathology Assessment and Plan  Patient Details  Name: [name]. MRN: [id number] Date of Birth: [birth year]  SLP Diagnosis: Cognitive Impairments  Rehab Potential: Good ELOS: 2.5-3 weeks    Today's Date: 06/26/2021 SLP Individual Time: 1405-1500 SLP Individual Time Calculation (min): 57 min   Hospital Problem: Principal Problem:   VZV (varicella-zoster virus) infection Active Problems:   Pressure injury of skin  Past Medical History:  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   Cancer ([place])    CHF (congestive heart failure) ([place])    Coronary atherosclerosis of native coronary artery    DNR (do not resuscitate) 06/10/2021   Dyslipidemia    ED (erectile dysfunction)    GERD (gastroesophageal reflux disease)    hx of   History of kidney stones    Hypertension    Inguinal hernia    Left bundle branch block    chronic   Postsurgical percutaneous transluminal coronary angioplasty status    Shingles    Past Surgical History:  Past Surgical History:  Procedure Laterality Date   [place] TEST  05/25/12   CHOLECYSTECTOMY  1979   CORONARY STENT PLACEMENT  1997   INGUINAL HERNIA REPAIR  06/15/2012   Procedure: HERNIA REPAIR INGUINAL ADULT;  Surgeon: Adin Hector, MD;  Location: [facility];  Service: General;  Laterality: Bilateral;  Bilateral Inguinal Hernia Repair with Ultrapro Mesh   MOHS SURGERY     RIGHT/LEFT HEART CATH AND CORONARY ANGIOGRAPHY N/A 11/13/2019   Procedure: RIGHT/LEFT HEART CATH AND CORONARY ANGIOGRAPHY;  Surgeon: Jettie Booze, MD;  Location: [facility];  Service: Cardiovascular;  Laterality: N/A;   TOTAL HIP ARTHROPLASTY Left 01/10/2020   Procedure: TOTAL HIP ARTHROPLASTY ANTERIOR APPROACH;  Surgeon: Gaynelle Arabian, MD;  [facility];  Service: Orthopedics;  Laterality: Left;  168mn    Assessment /  Plan / Recommendation Clinical Impression Pt is a 87y/o male with persistent afrib, on eliquis, HFrEF, and HTN [facility] and purpuric RLE rash.  Pt was admitted for extensive workup.  Neuro consulted and recommended CT head, MRI/A brain, MRI whole spine, a wide range of serological studies, and an LP, all of which was negative except MRI spine showing compression fractures of L1, L sacrum, and compression fracture of T4/5, and LP which showed +VZV in CSF.  Diagnosed with VZV meningitis.  ID following and recommended 14 days of IV Acyclovir, contact/airborne precautions through 9/3, IV fluids, and therapy. Therapy evaluations were completed and pt was recommended for CIR. Patient admitted 06/25/21.  Patient demonstrates moderate-severe cognitive impairments characterized by impaired orientation, decreased recall of functional information, decreased working memory, decreased intellectual awareness and decreased problem solving which impacts his safety with functional and familiar tasks. [facility] points with a score 18 or above considered normal also reinforcing above deficits. Although patient appeared lethargic throughout session, he remained attentive and cooperative. Patient would benefit from skilled SLP intervention to maximize his cognitive functioning and overall functional independence prior to discharge.     Skilled Therapeutic Interventions          Administered a cognitive-linguistic evaluation, please see above for details. Patient's daughter present and provided education regarding patient's current cognitive functioning, goals of skilled SLP intervention and rehab process/procedures. She verbalized understanding.  SLP Assessment  Patient will need skilled [place] Pathology Services during CIR admission    Recommendations  Oral Care Recommendations: Oral care BID Recommendations for Other  Services: Neuropsych consult Patient destination: Home Follow up Recommendations: 24 hour supervision/assistance;Home Health SLP;Outpatient SLP Equipment Recommended: None recommended by SLP    SLP Frequency 3 to 5 out of 7 days   SLP Duration  SLP Intensity  SLP Treatment/Interventions 2.5-3 weeks  Minumum of 1-2 x/day, 30 to 90 minutes  Cognitive remediation/compensation;Internal/external aids;Therapeutic Activities;Environmental controls;Cueing hierarchy;Functional tasks;Patient/family education    Pain No/Denies Pain   Prior Functioning Type of Home: Apartment  Lives With: Alone Available Help at Discharge: Family;Available PRN/intermittently Vocation: Retired  SLP Evaluation Cognition Overall Cognitive Status: Impaired/Different from baseline Arousal/Alertness: Awake/alert Orientation Level: Oriented to person;Disoriented to situation;Disoriented to time;Disoriented to place Year: 2022 Month: September Day of Week: Incorrect Memory: Impaired Memory Impairment: Retrieval deficit;Decreased recall of new information Immediate Memory Recall: Blue;Sock;Bed Memory Recall Sock: Without Cue Memory Recall Blue: With Cue Memory Recall Bed: With Cue Awareness: Impaired Awareness Impairment: Intellectual impairment Problem Solving: Impaired Problem Solving Impairment: Functional basic Safety/Judgment: Impaired  Comprehension Auditory Comprehension Overall Auditory Comprehension: Appears within functional limits for tasks assessed Expression Expression Primary Mode of Expression: Verbal Verbal Expression Overall Verbal Expression: Appears within functional limits for tasks assessed Oral Motor Oral Motor/Sensory Function Overall Oral Motor/Sensory Function: Within functional limits Motor Speech Overall Motor Speech: Appears within functional limits for tasks assessed  Care Tool Care Tool Cognition Ability to hear (with hearing aid or hearing appliances if normally  used Ability to hear (with hearing aid or hearing appliances if normally used): 0. Adequate - no difficulty in normal conservation, social interaction, listening to TV   Expression of Ideas and Wants Expression of Ideas and Wants: 3. Some difficulty - exhibits some difficulty with expressing needs and ideas (e.g, some words or finishing thoughts) or speech is not clear   Understanding Verbal and Non-Verbal Content Understanding Verbal and Non-Verbal Content: 3. Usually understands - understands most conversations, but misses some part/intent of message. Requires cues at times to understand  Memory/Recall Ability Memory/Recall Ability : Current season     Short Term Goals: Week 1: SLP Short Term Goal 1 (Week 1): Patient will verbalize orientation to place, time and situation with Mod A multimodal cues. SLP Short Term Goal 2 (Week 1): Patient will demonstrate functional problem solving with Mod A multimodal cues. SLP Short Term Goal 3 (Week 1): Patient will identify 2 cognitive and 2 physical deficits with Mod verbal cues.  Refer to Care Plan for Long Term Goals  Recommendations for other services: None   Discharge Criteria: Patient will be discharged from SLP if patient refuses treatment 3 consecutive times without medical reason, if treatment goals not met, if there is a change in medical status, if patient makes no progress towards goals or if patient is discharged from hospital.  The above assessment, treatment plan, treatment alternatives and goals were discussed and mutually agreed upon: by patient and by family  Parker Odonnell 06/26/2021, 3:23 PM

## 2021-06-26 NOTE — Plan of Care (Signed)
Problem: RH Balance Goal: LTG: Patient will maintain dynamic sitting balance (OT) Description: LTG:  Patient will maintain dynamic sitting balance with assistance during activities of daily living (OT) Flowsheets (Taken 06/26/2021 1523) LTG: Pt will maintain dynamic sitting balance during ADLs with: Supervision/Verbal cueing Goal: LTG Patient will maintain dynamic standing with ADLs (OT) Description: LTG:  Patient will maintain dynamic standing balance with assist during activities of daily living (OT)  Flowsheets (Taken 06/26/2021 1523) LTG: Pt will maintain dynamic standing balance during ADLs with: Contact Guard/Touching assist   Problem: Sit to Stand Goal: LTG:  Patient will perform sit to stand in prep for activites of daily living with assistance level (OT) Description: LTG:  Patient will perform sit to stand in prep for activites of daily living with assistance level (OT) Flowsheets (Taken 06/26/2021 1523) LTG: PT will perform sit to stand in prep for activites of daily living with assistance level: Contact Guard/Touching assist   Problem: Sit to Stand Goal: LTG:  Patient will perform sit to stand in prep for activites of daily living with assistance level (OT) Description: LTG:  Patient will perform sit to stand in prep for activites of daily living with assistance level (OT) Flowsheets (Taken 06/26/2021 1523) LTG: PT will perform sit to stand in prep for activites of daily living with assistance level: Contact Guard/Touching assist   Problem: RH Eating Goal: LTG Patient will perform eating w/assist, cues/equip (OT) Description: LTG: Patient will perform eating with assist, with/without cues using equipment (OT) Flowsheets (Taken 06/26/2021 1523) LTG: Pt will perform eating with assistance level of: Independent   Problem: RH Grooming Goal: LTG Patient will perform grooming w/assist,cues/equip (OT) Description: LTG: Patient will perform grooming with assist, with/without cues using  equipment (OT) Flowsheets (Taken 06/26/2021 1523) LTG: Pt will perform grooming with assistance level of: Independent   Problem: RH Bathing Goal: LTG Patient will bathe all body parts with assist levels (OT) Description: LTG: Patient will bathe all body parts with assist levels (OT) Flowsheets (Taken 06/26/2021 1523) LTG: Pt will perform bathing with assistance level/cueing: Contact Guard/Touching assist   Problem: RH Dressing Goal: LTG Patient will perform upper body dressing (OT) Description: LTG Patient will perform upper body dressing with assist, with/without cues (OT). Flowsheets (Taken 06/26/2021 1523) LTG: Pt will perform upper body dressing with assistance level of: Supervision/Verbal cueing Goal: LTG Patient will perform lower body dressing w/assist (OT) Description: LTG: Patient will perform lower body dressing with assist, with/without cues in positioning using equipment (OT) Flowsheets (Taken 06/26/2021 1523) LTG: Pt will perform lower body dressing with assistance level of: Contact Guard/Touching assist   Problem: RH Toileting Goal: LTG Patient will perform toileting task (3/3 steps) with assistance level (OT) Description: LTG: Patient will perform toileting task (3/3 steps) with assistance level (OT)  Flowsheets (Taken 06/26/2021 1523) LTG: Pt will perform toileting task (3/3 steps) with assistance level: Contact Guard/Touching assist   Problem: RH Functional Use of Upper Extremity Goal: LTG Patient will use RT/LT upper extremity as a (OT) Description: LTG: Patient will use right/left upper extremity as a stabilizer/gross assist/diminished/nondominant/dominant level with assist, with/without cues during functional activity (OT) Flowsheets (Taken 06/26/2021 1523) LTG: Use of upper extremity in functional activities: RUE as dominant level   Problem: RH Toilet Transfers Goal: LTG Patient will perform toilet transfers w/assist (OT) Description: LTG: Patient will perform toilet  transfers with assist, with/without cues using equipment (OT) Flowsheets (Taken 06/26/2021 1523) LTG: Pt will perform toilet transfers with assistance level of: Contact Guard/Touching assist  Problem: RH Tub/Shower Transfers Goal: LTG Patient will perform tub/shower transfers w/assist (OT) Description: LTG: Patient will perform tub/shower transfers with assist, with/without cues using equipment (OT) Flowsheets (Taken 06/26/2021 1523) LTG: Pt will perform tub/shower stall transfers with assistance level of: Contact Guard/Touching assist   Problem: RH Awareness Goal: LTG: Patient will demonstrate awareness during functional activites type of (OT) Description: LTG: Patient will demonstrate awareness during functional activites type of (OT) Flowsheets (Taken 06/26/2021 1523) LTG: Patient will demonstrate awareness during functional activites type of (OT): Supervision

## 2021-06-26 NOTE — Progress Notes (Signed)
Inpatient Rehabilitation Care Coordinator Assessment and Plan Patient Details  Name: Parker Odonnell. MRN: [id number] Date of Birth: Oct 18, 1932  Today's Date: 06/26/2021  Hospital Problems: Principal Problem:   VZV (varicella-zoster virus) infection Active Problems:   Pressure injury of skin  Past Medical History:  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   Cancer ([place])    CHF (congestive heart failure) ([place])    Coronary atherosclerosis of native coronary artery    DNR (do not resuscitate) 06/10/2021   Dyslipidemia    ED (erectile dysfunction)    GERD (gastroesophageal reflux disease)    hx of   History of kidney stones    Hypertension    Inguinal hernia    Left bundle branch block    chronic   Postsurgical percutaneous transluminal coronary angioplasty status    Shingles    Past Surgical History:  Past Surgical History:  Procedure Laterality Date   [place] TEST  05/25/12   [place]   INGUINAL HERNIA REPAIR  06/15/2012   Procedure: HERNIA REPAIR INGUINAL ADULT;  Surgeon: Adin Hector, MD;  Location: [facility];  Service: General;  Laterality: Bilateral;  Bilateral Inguinal Hernia Repair with Ultrapro Mesh   MOHS SURGERY     RIGHT/LEFT HEART CATH AND CORONARY ANGIOGRAPHY N/A 11/13/2019   Procedure: RIGHT/LEFT HEART CATH AND CORONARY ANGIOGRAPHY;  Surgeon: Jettie Booze, MD;  Location: [facility];  Service: Cardiovascular;  Laterality: N/A;   TOTAL HIP ARTHROPLASTY Left 01/10/2020   Procedure: TOTAL HIP ARTHROPLASTY ANTERIOR APPROACH;  Surgeon: Gaynelle Arabian, MD;  [facility];  Service: Orthopedics;  Laterality: Left;  162min   Social History:  reports that he has never smoked. He has never used smokeless tobacco. He reports that he does not drink alcohol and does not use drugs.  Family  / Support Systems Marital Status: Widow/Widower How Long?: 12 years; married for 52 years Patient Roles: Parent Spouse/Significant Other: Widow Children: 4 adult children. # live in [place], [place], and [place]. Pt reports his dtr Arby Barrette passed in 11 yrs ago. Other Supports: Private Aide- [employer] Anticipated Caregiver: [employer] Ability/Limitations of Caregiver: None reported Caregiver Availability: 24/7 Family Dynamics: Pt lives alone and receives aide assistance with Engineer, manufacturing.  Social History Preferred language: English Religion: Methodist Cultural Background: Pt worked as a Primary school teacher for 45yrs with  [employer] Education: college [place] - How often do you need to have someone help you when you read instructions, pamphlets, or other written material from your doctor or pharmacy?: Rarely Writes: Yes Employment Status: Retired Date Retired/Disabled/Unemployed: 1996 Age Retired: 64 Public relations account executive Issues: Denies Guardian/Conservator: N/A   Abuse/Neglect Abuse/Neglect Assessment Can Be Completed: Yes Physical Abuse: Denies Verbal Abuse: Denies Sexual Abuse: Denies Exploitation of patient/patient's resources: Denies Self-Neglect: Denies  Patient response to: Social Isolation - How often do you feel lonely or isolated from those around you?: Never  Emotional Status Pt's affect, behavior and adjustment status: Pt in good spirits at time of visit. Pt was resting with his eyes closed during visit. Pt was responsive to all questions. Recent Psychosocial Issues: Denies Psychiatric History: Denies Substance Abuse History: Denies  Patient / Family Perceptions, Expectations & Goals Pt/Family understanding of illness & functional limitations: Pt and family have a general understanding of care needs  Premorbid pt/family roles/activities: Independent Anticipated changes in roles/activities/participation: Assistance with  ADLs/IADLs Pt/family expectations/goals: Pt goal is to work on "walking and talking...whatever I can get out of life here to improve whatever I can."  [employer]: None Premorbid Home Care/DME Agencies: Other (Comment) Warehouse manager) Transportation available at discharge: TBD Is the patient able to respond to transportation needs?: Yes In the past 12 months, has lack of transportation kept you from medical appointments or from getting medications?: No In the past 12 months, has lack of transportation kept you from meetings, work, or from getting things needed for daily living?: No Resource referrals recommended: Neuropsychology  Discharge Planning Living Arrangements: Alone Support Systems: Children, Home care staff Type of Residence: Private residence, Other (Comment) [place] Name: Other (enter name of facility below) [place] Name: [place] (ILF) Insurance Resources: Multimedia programmer (specify) (UHC Medicare) Museum/gallery curator Resources: Fish farm manager, Other (Comment) (savings/retirement) Financial Screen Referred: No Living Expenses: Education officer, community Management: Family Does the patient have any problems obtaining your medications?: No Home Management: Pt reports his dtr [name] assists with home care needs Patient/Family Preliminary Plans: No changes Care Coordinator Barriers to Discharge: Decreased caregiver support, Lack of/limited family support Care Coordinator Anticipated Follow Up Needs: HH/OP  Clinical Impression SW met with pt in room at bedside to introduce self, explain role, and discuss discharge process. 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 No VA benefits. HCPOA- dtr [name]. No DME. Pt aware SW to followup with his dtr [name].   1107- SW left message for pt dtr [name] [phone] to introduce self, explain role, and discuss discharge process. SW encouraged follow up at convenience.   Shamell Hittle A Sue Mcalexander 06/26/2021, 11:14 AM

## 2021-06-26 NOTE — Progress Notes (Signed)
PROGRESS NOTE   Subjective/Complaints: Pt reports an uneventful night. Able to sleep. Ready for therapies today. Both legs with "numbness" although right leg affected more  ROS: Patient denies fever, rash, sore throat, blurred vision, nausea, vomiting, diarrhea, cough, shortness of breath or chest pain, joint or back pain, headache, or mood change.    Objective:   No results found. Recent Labs    06/26/21 0555  WBC 5.3  HGB 10.0*  HCT 29.7*  PLT 143*   Recent Labs    06/24/21 0249 06/26/21 0555  NA 132* 133*  K 3.7 3.7  CL 103 103  CO2 25 25  GLUCOSE 99 108*  BUN 12 13  CREATININE 1.02 0.93  CALCIUM 8.7* 8.9    Intake/Output Summary (Last 24 hours) at 06/26/2021 1013 Last data filed at 06/26/2021 [id number] Gross per 24 hour  Intake --  Output 1200 ml  Net -1200 ml     Pressure Injury 06/25/21 Buttocks Stage 2 -  Partial thickness loss of dermis presenting as a shallow open injury with a red, pink wound bed without slough. (Active)  06/25/21 1820  Location: Buttocks  Location Orientation:   Staging: Stage 2 -  Partial thickness loss of dermis presenting as a shallow open injury with a red, pink wound bed without slough.  Wound Description (Comments):   Present on Admission: Yes    Physical Exam: Vital Signs Blood pressure (!) 145/69, pulse 72, temperature 98.1 F (36.7 C), temperature source Oral, resp. rate 18, height '5\' 8"'$  (1.727 m), weight 69.3 kg, SpO2 93 %.  General: Alert and oriented x 3, No apparent distress HEENT: Head is normocephalic, atraumatic, PERRLA, EOMI, sclera anicteric, oral mucosa pink and moist, dentition intact, ext ear canals clear,  Neck: Supple without JVD or lymphadenopathy Heart: Reg rate and rhythm. No murmurs rubs or gallops Chest: CTA bilaterally without wheezes, rales, or rhonchi; no distress Abdomen: Soft, non-tender, non-distended, bowel sounds positive. Extremities: No  clubbing, cyanosis, or edema. Pulses are 2+ Psych: Pt's affect is appropriate. Pt is cooperative Skin: numerous abrasions R>L LE's, small buttock pressure wound Neuro:  Alert and oriented x 3. Fair insight. STM deficits. Follows one step commands without any issues.. Normal language and speech. Cranial nerve exam unremarkable. UE 5/5. LLE 3-/5 prox to 4/5 distally. RLE 1-2/5 prox to distal and limited by pain. Decreased LT/PP R>L LE's Musculoskeletal: right leg almost allodynic     Assessment/Plan: 1. Functional deficits which require 3+ hours per day of interdisciplinary therapy in a comprehensive inpatient rehab setting. Physiatrist is providing close team supervision and 24 hour management of active medical problems listed below. Physiatrist and rehab team continue to assess barriers to discharge/monitor patient progress toward functional and medical goals  Care Tool:  Bathing              Bathing assist       Upper Body Dressing/Undressing Upper body dressing   What is the patient wearing?: Hospital gown only    Upper body assist Assist Level: Minimal Assistance - Patient > 75%    Lower Body Dressing/Undressing Lower body dressing            Lower  body assist       Toileting Toileting    Toileting assist Assist for toileting: Maximal Assistance - Patient 25 - 49%     Transfers Chair/bed transfer  Transfers assist     Chair/bed transfer assist level: Maximal Assistance - Patient 25 - 49%     Locomotion Ambulation   Ambulation assist              Walk 10 feet activity   Assist           Walk 50 feet activity   Assist           Walk 150 feet activity   Assist           Walk 10 feet on uneven surface  activity   Assist           Wheelchair     Assist               Wheelchair 50 feet with 2 turns activity    Assist            Wheelchair 150 feet activity     Assist          Blood  pressure (!) 145/69, pulse 72, temperature 98.1 F (36.7 C), temperature source Oral, resp. rate 18, height '5\' 8"'$  (1.727 m), weight 69.3 kg, SpO2 93 %.  Medical Problem List and Plan: 1.  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  IV acyclovir completed today 06/25/2021             -patient may shower             -ELOS/Goals: 10-14 days/supervision             Patient is beginning CIR therapies today including PT and OT  2.  Antithrombotics: -DVT/anticoagulation: Eliquis Pharmaceutical: Other (comment)             -antiplatelet therapy: N/A 3. Pain Management: still with substantial neuropathic pain R>L LE -Lyrica 50 mg twice daily--increase to '75mg'$  bid -continue tramadol as needed               4. Mood: Provide emotional support             -antipsychotic agents: N/A 5. Neuropsych: This patient is capable of making decisions on his own behalf. 6. Skin/Wound Care: Routine skin checks 7. Fluids/Electrolytes/Nutrition: Routine in and outs             encourage po  I personally reviewed the patient's labs today.   8.  Viral leg rash/?vasculitis.  All lesions fully crusted.  Isolation discontinued  -skin bx pending  -rx pain 9.  Hyponatremia.  Felt to be secondary dehydration resolved after IV fluids.                133 9/8 10.  Persistent atrial fibrillation.  Cardiac rate controlled.  Continue Coreg 6.25 mg twice daily/Eliquis             Monitor with increased activity 11.  Diastolic congestive heart failure.  Ejection fraction 35 to 40% echocardiogram 2020.     -stable Filed Weights   06/25/21 1826 06/25/21 1834  Weight: 69.3 kg 69.3 kg    12.  Hypertension.  Norvasc 10 mg daily             9/8 bp controlled 13.  BPH/mild hydronephrosis.  Flomax 0.4 mg daily... Maintain Foley tube until follow-up outpatient urology.  Patient failed voiding trial 06/21/2021 14.  Hyperlipidemia:  Lipitor 15.  L1 superior endplate compression fracture/mild edema with spinous process T4-T5, sacral  insufficiency fracture.  Supportive care 16.  5 mm right lower lobe pulmonary nodule versus intrapulmonary lymph node.  Repeat noncontrast CT chest 12 months    LOS: 1 days A FACE TO FACE EVALUATION WAS PERFORMED  Meredith Staggers 06/26/2021, 10:13 AM

## 2021-06-26 NOTE — Evaluation (Signed)
Occupational Therapy Assessment and Plan  Patient Details  Name: Parker Odonnell. MRN: [id number] Date of Birth: Feb 24, 1932  OT Diagnosis: cognitive deficits and muscle weakness (generalized) Rehab Potential: Rehab Potential (ACUTE ONLY): Good ELOS: 2-3 weeks   Today's Date: 06/26/2021 OT Individual Time: 1100-1200 OT Individual Time Calculation (min): 60 min     Hospital Problem: Principal Problem:   VZV (varicella-zoster virus) infection Active Problems:   Pressure injury of skin   Past Medical History:  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   Cancer ([place])    CHF (congestive heart failure) ([place])    Coronary atherosclerosis of native coronary artery    DNR (do not resuscitate) 06/10/2021   Dyslipidemia    ED (erectile dysfunction)    GERD (gastroesophageal reflux disease)    hx of   History of kidney stones    Hypertension    Inguinal hernia    Left bundle branch block    chronic   Postsurgical percutaneous transluminal coronary angioplasty status    Shingles    Past Surgical History:  Past Surgical History:  Procedure Laterality Date   [place] TEST  05/25/12   CHOLECYSTECTOMY  1979   CORONARY STENT PLACEMENT  1997   INGUINAL HERNIA REPAIR  06/15/2012   Procedure: HERNIA REPAIR INGUINAL ADULT;  Surgeon: Adin Hector, MD;  Location: [facility];  Service: General;  Laterality: Bilateral;  Bilateral Inguinal Hernia Repair with Ultrapro Mesh   MOHS SURGERY     RIGHT/LEFT HEART CATH AND CORONARY ANGIOGRAPHY N/A 11/13/2019   Procedure: RIGHT/LEFT HEART CATH AND CORONARY ANGIOGRAPHY;  Surgeon: Jettie Booze, MD;  Location: [facility];  Service: Cardiovascular;  Laterality: N/A;   TOTAL HIP ARTHROPLASTY Left 01/10/2020   Procedure: TOTAL HIP ARTHROPLASTY ANTERIOR APPROACH;  Surgeon: Gaynelle Arabian, MD;  Location: [facility];  Service:  Orthopedics;  Laterality: Left;  161min    Assessment & Plan Clinical Impression: Patient is a 87 y.o. year old male with recent admission to the hospital on with history of CAD, legally blind, diastolic congestive heart failure, hyperlipidemia, atrial fibrillation maintained on Eliquis as well as left hip replacement. History taken from chart review and patient. Patient lives alone independent living facility/[facility]. Uses a rolling walker/cane for ambulation. He has a son and daughter in the area with good support planning on hired caregivers as needed. He presented on 06/10/2021 with right leg pain since 05/30/21 as well as recent fall. Denied LOC. Noted recent rash started on valacyclovir for suspected shingles uncertain diagnosis. Admission chemistries unremarkable except sodium 125, sedimentation rate within normal limits 15, blood cultures no growth to date, troponin negative, BNP 161.8, uric acid within normal limits. MRI of the brain unremarkable for acute intracranial. There was a 9 mm nodular lesion along the left lateral aspect of the medulla, indeterminate. No associated mass-effect. MRA of head and neck negative. MRI thoracic lumbar spine showed acute compression fracture involving the superior endplate of L1 with up to 35% height loss without significant bony retropulsion. Additional probable acute infarct of the left sacrum partially visualized. Mild edema within the spinous process of T4 and T5 of uncertain etiology. CT of the chest abdomen pelvis showed no mass or adenopathy. There was a 5 mm right lower lobe pulmonary nodule recommend CT scan in 12 months. MRI follow-up of the pelvis evaluation significantly limited by metallic  susceptibility artifact related to left hip arthroplasty. There was relatively focal bone marrow edema of the left aspect of S3 consistent with insufficiency fracture. He did undergo LP CSF with leukocytosis/lymphocytes and elevated protein. Neurology as well as  ID consulted for follow-up bilateral lower extremity weakness right more than left/purpuric right lower extremity rash. Etiology unclear differential included infectious vasculitis paraneoplastic extensive work-up noted. Imaging so far was unrevealing. Autoimmune work-up negative. Skin biopsy is pending. Given lymphocytic pleocytosis and elevated protein on CSF, VZV PCR now positive continued on acyclovir stop date on 06/25/2021 admission isolation discontinued. He remains on chronic Eliquis as prior to admission. Patient with BPH mild hydronephrosis seen on CT abdomen maintain on Flomax and a Foley catheter tube was placed after failed voiding trial and would continue to maintain Foley until follow-up outpatient urology. Therapy evaluations completed due to patient decreased functional as well as cognitive ability was admitted for a comprehensive rehab program. Please see preadmission assessment from earlier today as well. Patient transferred to CIR on 06/25/2021 .    Patient currently requires max with basic self-care skills secondary to muscle weakness, decreased cardiorespiratoy endurance, decreased awareness and decreased safety awareness, and decreased sitting balance, decreased standing balance, decreased postural control, and decreased balance strategies.  Prior to hospitalization, patient could complete BADL with modified independent .  Patient will benefit from skilled intervention to increase independence with basic self-care skills prior to discharge home with care partner.  Anticipate patient will require 24 hour supervision and follow up home health.  OT - End of Session Endurance Deficit: Yes Endurance Deficit Description: Multiple rest breaks within BADL tasks OT Assessment Rehab Potential (ACUTE ONLY): Good OT Barriers to Discharge: Decreased caregiver support OT Barriers to Discharge Comments: Patient lives in [place] with limited care resources per chart review-will need to clarify with  family OT Patient demonstrates impairments in the following area(s): Balance;Behavior;Endurance;Motor;Pain;Nutrition;Edema;Cognition;Perception;Safety;Sensory;Skin Integrity;Vision OT Basic ADL's Functional Problem(s): Eating;Grooming;Bathing;Dressing;Toileting OT Transfers Functional Problem(s): Toilet;Tub/Shower OT Additional Impairment(s): Fuctional Use of Upper Extremity OT Plan OT Intensity: Minimum of 1-2 x/day, 45 to 90 minutes OT Frequency: 5 out of 7 days OT Duration/Estimated Length of Stay: 2-3 weeks OT Treatment/Interventions: Balance/vestibular training;Cognitive remediation/compensation;Community reintegration;Discharge planning;Disease mangement/prevention;DME/adaptive equipment instruction;Functional electrical stimulation;Functional mobility training;Neuromuscular re-education;Patient/family education;Pain management;Self Care/advanced ADL retraining;Skin care/wound managment;Psychosocial support;Therapeutic Activities;Therapeutic Exercise;Splinting/orthotics;UE/LE Strength taining/ROM;UE/LE Coordination activities;Visual/perceptual remediation/compensation;Wheelchair propulsion/positioning OT Self Feeding Anticipated Outcome(s): Independent OT Basic Self-Care Anticipated Outcome(s): supervisin/CGA OT Toileting Anticipated Outcome(s): CGA OT Bathroom Transfers Anticipated Outcome(s): CGA OT Recommendation Patient destination: Home (ILF vs. ALF) Follow Up Recommendations: Home health OT Equipment Recommended: To be determined   OT Evaluation Precautions/Restrictions  Precautions Precautions: Fall Precaution Comments: R>L lower extremity weakness, pt is legally blind, has foley catheter Restrictions Weight Bearing Restrictions: No Pain Pain Assessment Pain Scale: 0-10 No pain number given but reports high pain in R LE with movement Home Living/Prior Functioning Home Living Living Arrangements: Alone Available Help at Discharge: Family, Available  PRN/intermittently Type of Home: Apartment Home Access: Level entry, Elevator Home Layout: One level Bathroom Shower/Tub: Multimedia programmer: Handicapped height Bathroom Accessibility: Yes Additional Comments: Patient lives in [place]  Lives With: Alone Prior Function Level of Independence: Independent with basic ADLs, Independent with transfers, Independent with gait, Requires assistive device for independence  Able to Take Stairs?: Yes (states he could go up stairs, but took the elevator down) Driving: No Vocation: Retired Comments: prior to recent onset of leg weakness, modified independent with mobility using cane Vision Baseline Vision/History: 2 Legally blind;6  Macular Degeneration (per patient report) Ability to See in Adequate Light: 3 Highly impaired Patient Visual Report: No change from baseline Vision Assessment?: Yes Eye Alignment: Impaired (comment) (R exophoria) Ocular Range of Motion: Within Functional Limits Alignment/Gaze Preference: Within Defined Limits Tracking/Visual Pursuits: Able to track stimulus in all quads without difficulty Saccades: Within functional limits Perception  Perception: Within Functional Limits Praxis Praxis: Intact Cognition Overall Cognitive Status: Impaired/Different from baseline Arousal/Alertness: Awake/alert Orientation Level: Person;Place;Situation Person: Oriented Place: Disoriented Situation: Disoriented Year: 2022 Month: September Day of Week: Incorrect Memory: Impaired Memory Impairment: Retrieval deficit;Decreased recall of new information Immediate Memory Recall: Blue;Sock;Bed Memory Recall Sock: Without Cue Memory Recall Blue: With Cue Memory Recall Bed: With Cue Safety/Judgment: Impaired Sensation Sensation Light Touch: Impaired Detail Peripheral sensation comments: hyperalgesia throughout both LEs R>L, intact light touch with testing Light Touch Impaired Details: Impaired RLE Coordination Gross Motor  Movements are Fluid and Coordinated: No Fine Motor Movements are Fluid and Coordinated: No Coordination and Movement Description: decreased smoothness and accuracy Motor  Motor Motor: Abnormal postural alignment and control Motor - Skilled Clinical Observations: Generalized weakness, decreased balance strategies, significant fear of falling  Trunk/Postural Assessment  Cervical Assessment Cervical Assessment: Exceptions to [facility] (forward head posture) Thoracic Assessment Thoracic Assessment: Exceptions to [facility] (kyphosis) Lumbar Assessment Lumbar Assessment: Exceptions to [facility] (posterior pelvic tilt) Postural Control Postural Control: Deficits on evaluation (decreased/delayed)  Balance Static Sitting Balance Static Sitting - Balance Support: Feet supported;Bilateral upper extremity supported Static Sitting - Level of Assistance: 4: Min assist Dynamic Sitting Balance Dynamic Sitting - Balance Support: During functional activity;Feet supported Dynamic Sitting - Level of Assistance: 4: Min Insurance risk surveyor Standing - Balance Support: Bilateral upper extremity supported Static Standing - Level of Assistance: 3: Mod assist Static Standing - Comment/# of Minutes: <10 sec in standing due to pain and fear of falling Dynamic Standing Balance Dynamic Standing - Comments: UTA Extremity/Trunk Assessment RUE Assessment RUE Assessment: Exceptions to [facility] General Strength Comments: Generalized weakness, R UE weaker than L LUE Assessment LUE Assessment: Exceptions to [facility] General Strength Comments: generalized weakness  Care Tool Care Tool Self Care Eating   Eating Assist Level: Set up assist    Oral Care    Oral Care Assist Level: Minimal Assistance - Patient > 75%    Bathing   Body parts bathed by patient: Right arm;Chest;Left arm;Abdomen;Front perineal area;Face;Right upper leg;Left upper leg Body parts bathed by helper: Right lower leg;Left lower leg;Buttocks    Assist Level: Maximal Assistance - Patient 24 - 49%    Upper Body Dressing(including orthotics)   What is the patient wearing?: Pull over shirt   Assist Level: Moderate Assistance - Patient 50 - 74%    Lower Body Dressing (excluding footwear)   What is the patient wearing?: Pants Assist for lower body dressing: Total Assistance - Patient < 25%    Putting on/Taking off footwear   What is the patient wearing?: Non-skid slipper socks Assist for footwear: Maximal Assistance - Patient 25 - 49%       Care Tool Toileting Toileting activity   Assist for toileting: Maximal Assistance - Patient 25 - 49%     Care Tool Bed Mobility Roll left and right activity   Roll left and right assist level: Supervision/Verbal cueing    Sit to lying activity   Sit to lying assist level: Minimal Assistance - Patient > 75%    Lying to sitting on side of bed activity   Lying to sitting on  side of bed assist level: the ability to move from lying on the back to sitting on the side of the bed with no back support.: Minimal Assistance - Patient > 75%     Care Tool Transfers Sit to stand transfer   Sit to stand assist level: Moderate Assistance - Patient 50 - 74% Sit to stand assistive device: Walker  Chair/bed transfer   Chair/bed transfer assist level: Dependent - mechanical lift [employer])     Toilet transfer   Assist Level: Total Assistance - Patient < 25% Charlaine Dalton)     Care Tool Cognition  Expression of Ideas and Wants Expression of Ideas and Wants: 3. Some difficulty - exhibits some difficulty with expressing needs and ideas (e.g, some words or finishing thoughts) or speech is not clear  Understanding Verbal and Non-Verbal Content Understanding Verbal and Non-Verbal Content: 3. Usually understands - understands most conversations, but misses some part/intent of message. Requires cues at times to understand   Memory/Recall Ability Memory/Recall Ability : Current season;Location of own room;That he  or she is in a hospital/hospital unit   Refer to Care Plan for [place] 1 OT Short Term Goal 1 (Week 1): Patient will perform sit<>stand with max A of 1 and LRAD in preparation for BADL tasks. OT Short Term Goal 2 (Week 1): Patient will perform 1 step of LB dressing task OT Short Term Goal 3 (Week 1): Pt will don shirt with min A  Recommendations for other services: None    Skilled Therapeutic Intervention Pt greeted semi-reclined in bed and agreeable to OT eval and treat. Pt anxious with strong pain response during bed mobility and BADL tasks. Pt needed min A to get to sitting EOB and Min A for sitting balance. Bathing completed seated EOB with min A to wash upper body and max A for LB. Attempted sit<>stand without AD and pt with strong posterior lean and unable to weight shift to get to standing. Stedy positioned and he was able to stand in [place] with mod A and maintain standing while OT assisted with washing buttocks. Completed 2 more sit<>stands in stedy for practice and moved pt up in bed in [place]. Pt anxious when OT began to move [place]. Mod A to lift R LE back into bed at end of session. OT addressed rehab process, OT purpose, POC, ELOS, and goals.  Pt with intermittent confusion throughout session as well requiring cues for orientation. Pt left semi-reclined in bed at end of session with bed alarm on and needs met.  ADL ADL Eating: Set up Grooming: Minimal assistance Upper Body Bathing: Minimal assistance Lower Body Bathing: Maximal assistance Upper Body Dressing: Moderate assistance Lower Body Dressing: Maximal assistance Toileting: Maximal assistance Toilet Transfer: Dependent Charlaine Dalton) Tub/Shower Transfer: Unable to assess Mobility  Transfers Sit to Stand: Dependent - mechanical lift Stand to Sit: Dependent - mechanical lift   Discharge Criteria: Patient will be discharged from OT if patient refuses treatment 3 consecutive times without medical  reason, if treatment goals not met, if there is a change in medical status, if patient makes no progress towards goals or if patient is discharged from hospital.  The above assessment, treatment plan, treatment alternatives and goals were discussed and mutually agreed upon: by patient  Valma Cava 06/26/2021, 12:53 PM

## 2021-06-26 NOTE — Progress Notes (Signed)
Initial Nutrition Assessment  DOCUMENTATION CODES:   Not applicable  INTERVENTION:  Continue Ensure Enlive po TID, each supplement provides 350 kcal and 20 grams of protein  Encourage adequate PO intake.  NUTRITION DIAGNOSIS:   Increased nutrient needs related to chronic illness (HF) as evidenced by estimated needs.  GOAL:   Patient will meet greater than or equal to 90% of their needs  MONITOR:   PO intake, Supplement acceptance, Labs, Weight trends, Skin, I & O's  REASON FOR ASSESSMENT:   Malnutrition Screening Tool    ASSESSMENT:   87 year old with history of CAD, legally blind, diastolic congestive heart failure, hyperlipidemia, as well as left hip replacement. He presented on 06/10/2021 with right leg pain since 05/30/21 as well as recent fall. bilateral lower extremity weakness right more than left/purpuric right lower extremity rash. Given lymphocytic pleocytosis and elevated protein on CSF, VZV PCR now positive. Patient decreased functional as well as cognitive ability was admitted for a comprehensive rehab program.  Pt unavailable during attempted time of contact. Meal completion has been varied from 35-85%. Pt with no weight loss per weight records. Pt currently has Ensure ordered and has been consuming them. RD to continue with current orders to aid in caloric and protein needs. Unable to complete Nutrition-Focused physical exam at this time.   Labs and medications reviewed.   Diet Order:   Diet Order             Diet regular Room service appropriate? No; Fluid consistency: Thin  Diet effective now                   EDUCATION NEEDS:   Not appropriate for education at this time  Skin:  Skin Assessment: Skin Integrity Issues: Skin Integrity Issues:: Stage II Stage II: buttocks  Last BM:  9/6  Height:   Ht Readings from Last 1 Encounters:  06/25/21 '5\' 8"'$  (1.727 m)    Weight:   Wt Readings from Last 1 Encounters:  06/25/21 69.3 kg   BMI:   Body mass index is 23.23 kg/m.  Estimated Nutritional Needs:   Kcal:  1750-1900  Protein:  80-90 grams  Fluid:  >/= 1.7 L/day  Corrin Parker, MS, RD, LDN RD pager number/after hours weekend pager number on Amion.

## 2021-06-26 NOTE — Progress Notes (Signed)
Inpatient Rehabilitation  Patient information reviewed and entered into eRehab system by Kennan Detter Keyuana Wank, OTR/L.   Information including medical coding, functional ability and quality indicators will be reviewed and updated through discharge.    

## 2021-06-26 NOTE — Discharge Instructions (Addendum)
Inpatient Rehab Discharge Instructions  Parker Odonnell. Discharge date and time: No discharge date for patient encounter.   Activities/Precautions/ Functional Status: Activity: activity as tolerated Diet: regular diet Wound Care: Routine skin checks Functional status:  ___ No restrictions     ___ Walk up steps independently ___ 24/7 supervision/assistance   ___ Walk up steps with assistance ___ Intermittent supervision/assistance  ___ Bathe/dress independently ___ Walk with walker     _x__ Bathe/dress with assistance ___ Walk Independently    ___ Shower independently ___ Walk with assistance    ___ Shower with assistance ___ No alcohol     ___ Return to work/school ________  Special Instructions:  No driving smoking or alcohol  Follow-up CT of the chest 12 months for 5 mm right lower lobe pulmonary nodule  My questions have been answered and I understand these instructions. I will adhere to these goals and the provided educational materials after my discharge from the hospital.  Patient/Caregiver Signature _______________________________ Date __________  Clinician Signature _______________________________________ Date __________  Please bring this form and your medication list with you to all your follow-up doctor's appointments.     ===============================================================================================================  Information on my medicine - ELIQUIS (apixaban)  Why was Eliquis prescribed for you? Eliquis was prescribed for you to reduce the risk of a blood clot forming that can cause a stroke if you have a medical condition called atrial fibrillation (a type of irregular heartbeat).  What do You need to know about Eliquis ? Take your Eliquis TWICE DAILY - one tablet in the morning and one tablet in the evening with or without food. If you have difficulty swallowing the tablet whole please discuss with your pharmacist how to take the  medication safely.  Take Eliquis exactly as prescribed by your doctor and DO NOT stop taking Eliquis without talking to the doctor who prescribed the medication.  Stopping may increase your risk of developing a stroke.  Refill your prescription before you run out.  After discharge, you should have regular check-up appointments with your healthcare provider that is prescribing your Eliquis.  In the future your dose may need to be changed if your kidney function or weight changes by a significant amount or as you get older.  What do you do if you miss a dose? If you miss a dose, take it as soon as you remember on the same day and resume taking twice daily.  Do not take more than one dose of ELIQUIS at the same time to make up a missed dose.  Important Safety Information A possible side effect of Eliquis is bleeding. You should call your healthcare provider right away if you experience any of the following: Bleeding from an injury or your nose that does not stop. Unusual colored urine (red or dark brown) or unusual colored stools (red or black). Unusual bruising for unknown reasons. A serious fall or if you hit your head (even if there is no bleeding).  Some medicines may interact with Eliquis and might increase your risk of bleeding or clotting while on Eliquis. To help avoid this, consult your healthcare provider or pharmacist prior to using any new prescription or non-prescription medications, including herbals, vitamins, non-steroidal anti-inflammatory drugs (NSAIDs) and supplements.  This website has more information on Eliquis (apixaban): http://www.eliquis.com/eliquis/home

## 2021-06-27 MED ORDER — BENZOCAINE 10 % MT GEL
Freq: Four times a day (QID) | OROMUCOSAL | Status: DC
  Administered 2021-07-07 – 2021-07-17 (×5): 1 via OROMUCOSAL
  Filled 2021-06-27: qty 9

## 2021-06-27 NOTE — Progress Notes (Signed)
Occupational Therapy Session Note  Patient Details  Name: Parker Odonnell. MRN: [id number] Date of Birth: [birth year]  Today's Date: 06/27/2021 OT Individual Time: 0700-0800 OT Individual Time Calculation (min): 60 min    Short Term Goals: Week 1:  OT Short Term Goal 1 (Week 1): Patient will perform sit<>stand with max A of 1 and LRAD in preparation for BADL tasks. OT Short Term Goal 2 (Week 1): Patient will perform 1 step of LB dressing task OT Short Term Goal 3 (Week 1): Pt will don shirt with min A  Skilled Therapeutic Interventions/Progress Updates:     Pt received in bed with unrated pain in gums and L knee. Repositioning provided for knee  ADL:  Pt reporting gum pain throughout session. Provided swab and oral rinse & alerted RN/MD to pain. Pt reporting swab helping "some." Pt requires increased time to bed mobilty to EOB and up to MOD A for sitting balnce initiallly and improving to CGA for sitting balance with VC for trunk control at EOB. Overall pt requires MIN A for UB ADLs and reports too much fatigue for LB ADLs. Stedy transfer to w/c with MIN A to power up and VC for body mechanics. [place] A to wash hair and s/u to dry.   Pt left at end of session in bed with exit alarm on, call light in reach and all needs met   Therapy Documentation Precautions:  Precautions Precautions: Fall Precaution Comments: R>L lower extremity weakness, pt is legally blind, has foley catheter Restrictions Weight Bearing Restrictions: No General:   Vital Signs: Therapy Vitals Temp: 97.7 F (36.5 C) Temp Source: Oral Pulse Rate: (!) 59 Resp: 18 BP: 136/75 Patient Position (if appropriate): Lying Oxygen Therapy SpO2: 91 % O2 Device: Room Air Pain:   ADL: ADL Eating: Set up Grooming: Minimal assistance Upper Body Bathing: Minimal assistance Lower Body Bathing: Maximal assistance Upper Body Dressing: Moderate assistance Lower Body Dressing: Maximal assistance Toileting: Maximal  assistance Toilet Transfer: Dependent Charlaine Dalton) Tub/Shower Transfer: Unable to assess Vision   Perception    Praxis   Exercises:   Other Treatments:     Therapy/Group: Individual Therapy  Tonny Branch 06/27/2021, 6:57 AM

## 2021-06-27 NOTE — IPOC Note (Signed)
Overall Plan of Care [facility]) Patient Details Name: Parker Odonnell. MRN: [id number] DOB: 11/01/1931  Admitting Diagnosis: VZV (varicella-zoster virus) infection, meningitis  Hospital Problems: Principal Problem:   VZV (varicella-zoster virus) infection Active Problems:   Pressure injury of skin     Functional Problem List: Nursing Bladder, Bowel, Endurance, Medication Management, Pain, Safety, Sensory  PT Balance, Behavior, Edema, Endurance, Motor, Nutrition, Pain, Skin Integrity, Sensory, Safety, Perception  OT Balance, Behavior, Endurance, Motor, Pain, Nutrition, Edema, Cognition, Perception, Safety, Sensory, Skin Integrity, Vision  SLP Cognition  TR         Basic ADL's: OT Eating, Grooming, Bathing, Dressing, Toileting     Advanced  ADL's: OT       Transfers: PT Bed Mobility, Bed to Chair, Car, Manufacturing systems engineer, Metallurgist: PT Ambulation, Emergency planning/management officer, Stairs     Additional Impairments: OT Fuctional Use of Upper Extremity  SLP Social Cognition   Problem Solving, Memory, Awareness  TR      Anticipated Outcomes Item Anticipated Outcome  Self Feeding Independent  Swallowing      Basic self-care  supervisin/CGA  Toileting  CGA   Bathroom Transfers CGA  Bowel/Bladder  min assist  Transfers  CGA using RW  Locomotion  CGA 75 ft using LRAD  Communication     Cognition  Supervision  Pain  < 3  Safety/Judgment  min assist and no falls   Therapy Plan: PT Intensity: Minimum of 1-2 x/day ,45 to 90 minutes PT Frequency: 5 out of 7 days PT Duration Estimated Length of Stay: 2-3 weeks OT Intensity: Minimum of 1-2 x/day, 45 to 90 minutes OT Frequency: 5 out of 7 days OT Duration/Estimated Length of Stay: 2-3 weeks SLP Intensity: Minumum of 1-2 x/day, 30 to 90 minutes SLP Frequency: 3 to 5 out of 7 days SLP Duration/Estimated Length of Stay: 2.5-3 weeks   Due to the current state of emergency, patients may not be receiving  their 3-hours of Medicare-mandated therapy.   Team Interventions: Nursing Interventions Patient/Family Education, Bladder Management, Bowel Management, Disease Management/Prevention, Pain Management, Medication Management, Discharge Planning  PT interventions Ambulation/gait training, Cognitive remediation/compensation, Discharge planning, DME/adaptive equipment instruction, Functional mobility training, Pain management, Psychosocial support, Splinting/orthotics, Therapeutic Activities, UE/LE Strength taining/ROM, Visual/perceptual remediation/compensation, Wheelchair propulsion/positioning, UE/LE Coordination activities, Therapeutic Exercise, Stair training, Skin care/wound management, Patient/family education, Neuromuscular re-education, Functional electrical stimulation, Disease management/prevention, Academic librarian, Training and development officer  OT Interventions Training and development officer, Cognitive remediation/compensation, Academic librarian, Discharge planning, Disease mangement/prevention, Engineer, drilling, Functional electrical stimulation, Functional mobility training, Neuromuscular re-education, Patient/family education, Pain management, Self Care/advanced ADL retraining, Skin care/wound managment, Psychosocial support, Therapeutic Activities, Therapeutic Exercise, Splinting/orthotics, UE/LE Strength taining/ROM, UE/LE Coordination activities, Visual/perceptual remediation/compensation, Wheelchair propulsion/positioning  SLP Interventions Cognitive remediation/compensation, Internal/external aids, Therapeutic Activities, Environmental controls, Cueing hierarchy, Functional tasks, Patient/family education  TR Interventions    SW/CM Interventions Discharge Planning, Psychosocial Support, Patient/Family Education   Barriers to Discharge MD  Medical stability  Nursing Decreased caregiver support, Home environment access/layout, Incontinence, Neurogenic Bowel &  Bladder, Lack of/limited family support, Weight bearing restrictions, Medication compliance Lives in [place], [place] with level entry. Has hired caregivers, Engineer, manufacturing. Daughter is primary contact. Family works. Caregivers are available 24/7.  PT Decreased caregiver support, Lack of/limited family support, Behavior    OT Decreased caregiver support Patient lives in [place] with limited care resources per chart review-will need to clarify with family  SLP Decreased caregiver support Patient lives in [place]  SW Decreased  caregiver support, Lack of/limited family support     Team Discharge Planning: Destination: PT-Home ,OT- Home (ILF vs. ALF) , SLP-Home Projected Follow-up: PT-Home health PT, OT-  Home health OT, SLP-24 hour supervision/assistance, Home Health SLP, Outpatient SLP Projected Equipment Needs: PT-Rolling walker with 5" wheels, Wheelchair cushion (measurements), To be determined, OT- To be determined, SLP-None recommended by SLP Equipment Details: PT- , OT-  Patient/family involved in discharge planning: PT- Patient unable/family or caregiver not available,  OT-Patient, SLP-Family member/caregiver  MD ELOS: 15-19 days Medical Rehab Prognosis:  Excellent Assessment: The patient has been admitted for CIR therapies with the diagnosis of meningitis/vasculitis. The team will be addressing functional mobility, strength, stamina, balance, safety, adaptive techniques and equipment, self-care, bowel and bladder mgt, patient and caregiver education, NMR, cognition, pain mgt, community reentry. Goals have been set at [facility] to supervision with self-care, CGA with mobility and supervision with cogniton.   Due to the current state of emergency, patients may not be receiving their 3 hours per day of Medicare-mandated therapy.    Meredith Staggers, MD, FAAPMR     See Team Conference Notes for weekly updates to the plan of care

## 2021-06-27 NOTE — Progress Notes (Signed)
PROGRESS NOTE   Subjective/Complaints: Reports pain in gums. Feels that they are swollen, dirty.   ROS: Patient denies fever, rash, sore throat, blurred vision, nausea, vomiting, diarrhea, cough, shortness of breath or chest pain,   headache, or mood change.   Objective:   No results found. Recent Labs    06/26/21 0555  WBC 5.3  HGB 10.0*  HCT 29.7*  PLT 143*   Recent Labs    06/26/21 0555  NA 133*  K 3.7  CL 103  CO2 25  GLUCOSE 108*  BUN 13  CREATININE 0.93  CALCIUM 8.9    Intake/Output Summary (Last 24 hours) at 06/27/2021 1150 Last data filed at 06/27/2021 0830 Gross per 24 hour  Intake 630 ml  Output 1530 ml  Net -900 ml     Pressure Injury 06/25/21 Buttocks Stage 2 -  Partial thickness loss of dermis presenting as a shallow open injury with a red, pink wound bed without slough. (Active)  06/25/21 1820  Location: Buttocks  Location Orientation:   Staging: Stage 2 -  Partial thickness loss of dermis presenting as a shallow open injury with a red, pink wound bed without slough.  Wound Description (Comments):   Present on Admission: Yes    Physical Exam: Vital Signs Blood pressure 136/75, pulse (!) 59, temperature 97.7 F (36.5 C), temperature source Oral, resp. rate 18, height '5\' 8"'$  (1.727 m), weight 69.3 kg, SpO2 91 %.  Constitutional: No distress . Vital signs reviewed. HEENT: NCAT, EOMI, oral membranes moist Neck: supple Cardiovascular: RRR without murmur. No JVD    Respiratory/Chest: CTA Bilaterally without wheezes or rales. Normal effort    GI/Abdomen: BS +, non-tender, non-distended Ext: no clubbing, cyanosis, or edema Psych: pleasant and cooperative  Skin: numerous abrasions R>L LE's, small buttock pressure wound Neuro:  Alert and oriented x 3. Fair insight. STM deficits. Follows one step commands without any issues.. Normal language and speech. Cranial nerve exam unremarkable. UE 5/5. LLE  3-/5 prox to 4/5 distally. RLE 1-2/5 prox to distal and limited by pain. Decreased LT/PP R>L LE's Musculoskeletal: right leg sensitive, but a little less tender today   Assessment/Plan: 1. Functional deficits which require 3+ hours per day of interdisciplinary therapy in a comprehensive inpatient rehab setting. Physiatrist is providing close team supervision and 24 hour management of active medical problems listed below. Physiatrist and rehab team continue to assess barriers to discharge/monitor patient progress toward functional and medical goals  Care Tool:  Bathing    Body parts bathed by patient: Right arm, Chest, Left arm, Abdomen, Front perineal area, Face, Right upper leg, Left upper leg   Body parts bathed by helper: Right lower leg, Left lower leg, Buttocks     Bathing assist Assist Level: Maximal Assistance - Patient 24 - 49%     Upper Body Dressing/Undressing Upper body dressing   What is the patient wearing?: Pull over shirt    Upper body assist Assist Level: Moderate Assistance - Patient 50 - 74%    Lower Body Dressing/Undressing Lower body dressing      What is the patient wearing?: Pants     Lower body assist Assist for lower body  dressing: Total Assistance - Patient < 25%     Toileting Toileting    Toileting assist Assist for toileting: Maximal Assistance - Patient 25 - 49%     Transfers Chair/bed transfer  Transfers assist     Chair/bed transfer assist level: Dependent - mechanical lift     Locomotion Ambulation   Ambulation assist      Assist level: 2 helpers Assistive device: Walker-rolling Max distance: 2 ft   Walk 10 feet activity   Assist  Walk 10 feet activity did not occur: Safety/medical concerns        Walk 50 feet activity   Assist Walk 50 feet with 2 turns activity did not occur: Safety/medical concerns         Walk 150 feet activity   Assist Walk 150 feet activity did not occur: Safety/medical  concerns         Walk 10 feet on uneven surface  activity   Assist Walk 10 feet on uneven surfaces activity did not occur: Safety/medical concerns         Wheelchair     Assist     Wheelchair activity did not occur: Safety/medical concerns         Wheelchair 50 feet with 2 turns activity    Assist    Wheelchair 50 feet with 2 turns activity did not occur: Safety/medical concerns       Wheelchair 150 feet activity     Assist  Wheelchair 150 feet activity did not occur: Safety/medical concerns       Blood pressure 136/75, pulse (!) 59, temperature 97.7 F (36.5 C), temperature source Oral, resp. rate 18, height '5\' 8"'$  (1.727 m), weight 69.3 kg, SpO2 91 %.  Medical Problem List and Plan: 1.  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  IV acyclovir completed today 06/25/2021             -patient may shower             -ELOS/Goals: 10-14 days/supervision             -Continue CIR therapies including PT, OT, and SLP  2.  Antithrombotics: -DVT/anticoagulation: [employer]: Other (comment)             -antiplatelet therapy: N/A 3. Pain Management: still with substantial neuropathic pain R>L LE -Lyrica  -increased to '75mg'$  bid--?some benefit? -continue tramadol as needed               4. Mood: Provide emotional support             -antipsychotic agents: N/A 5. Neuropsych: This patient is capable of making decisions on his own behalf. 6. Skin/Wound Care: Routine skin checks 7. Fluids/Electrolytes/Nutrition: Routine in and outs             encourage po  I personally reviewed the patient's labs today.   8.  Viral leg rash/?vasculitis.  All lesions fully crusted.  Isolation discontinued  -skin bx --perivascular dermatitis  -rx pain 9.  Hyponatremia.  Felt to be secondary dehydration resolved after IV fluids.                133 9/8 10.  Persistent atrial fibrillation.  Cardiac rate controlled.  Continue Coreg 6.25 mg twice daily/Eliquis              Monitor with increased activity 11.  Diastolic congestive heart failure.  Ejection fraction 35 to 40% echocardiogram 2020.     -stable weights--need [facility]  Weights   06/25/21 1826 06/25/21 1834  Weight: 69.3 kg 69.3 kg    12.  Hypertension.  Norvasc 10 mg daily             9/9 bp controlled 13.  BPH/mild hydronephrosis.  Flomax 0.4 mg daily... Maintain Foley tube until follow-up outpatient urology.  Patient failed voiding trial 06/21/2021 14.  Hyperlipidemia: Lipitor 15.  L1 superior endplate compression fracture/mild edema with spinous process T4-T5, sacral insufficiency fracture.  Supportive care 16.  5 mm right lower lobe pulmonary nodule versus intrapulmonary lymph node.  Repeat noncontrast CT chest 12 months    LOS: 2 days A FACE TO FACE EVALUATION WAS PERFORMED  Meredith Staggers 06/27/2021, 11:50 AM

## 2021-06-27 NOTE — Progress Notes (Signed)
Physical Therapy Session Note  Patient Details  Name: Parker Odonnell. MRN: [id number] Date of Birth: Mar 05, 1932  Today's Date: 06/27/2021 PT Individual Time: [id number] PT Individual Time Calculation (min): 38 min and 18 min  Short Term Goals: Week 1:  PT Short Term Goal 1 (Week 1): Patient will perform bed mobility with supervision using hospital bed features. PT Short Term Goal 2 (Week 1): Patient will perform basic transfers with min A consistently. PT Short Term Goal 3 (Week 1): Patient will ambulate >10 feet with +2 assist.  Skilled Therapeutic Interventions/Progress Updates:     Session 1: Patient in TIS w/c upon PT arrival. Patient alert and agreeable to PT session. Patient reported un-rated lower extremity pain R>L with intermittent crying out or grimacing during session, RN made aware. Pain notably improved from yesterday. PT provided repositioning, rest breaks, and distraction as pain interventions throughout session.   Therapeutic Activity: Bed Mobility: Patient performed sit to supine with min a for lower extremity management. Provided verbal cues for bringing knees to chest to lift lower extremities onto the bed. Transfers: Patient performed sit to/from stand x4 in the // bars with mod progressing to min A with B knees blocked to prevent buckling. Provided multimodal cues for scooting forward, foot placement, forward weight shift and knee/hip/trunk extension. Patient stood 30 sec x2 and 45-60 sec x2 with B upper extremity support. Focused on R knee and B hip extension with erect posture in standing, most improved with grounding pressure about the R knee tactile cuing for trunk extension to promote elongation of R side. Patient performed sit to stand in the [place] x2 with CGA with improved knee and hip extension following standing training in // bars, cued patient to use tactile feedback from shin blocks to indicated need for increased kneed extension for improved quad  and gluteal activation. Patient transferred back to bed using Stedy with dependent assist.   Patient in bed at end of session for energy conservation due to patient sitting up >2 hours prior to session. Breaks locked, bed alarm set, and all needs within reach.   Session 2: Patient in bed asleep upon PT arrival. Patient required tactile and verbal stimulation to slowly arouse x2 then closed his eyes. Placed patient in chair position in bed for improved arousal with good results, and patient alert and agreeable to bed level PT session due to fatigue. Patient denied pain during session.  Therapeutic Exercise: Patient performed the following exercises with verbal and tactile cues for proper technique. -B LAQ 2x10 with 50% assist on R -B shoulder flexion with eccentric control 2x10 -B contralateral reaching to targets focused on oblique muscle activation with trunk coming forward off the bed 2x10  Patient intermittently drowsy with inappropriate comments and behaviors that required increased time and max cuing to redirect. Patient transitioned to lying and began closing his eyes again.   Patient in bed alseep at end of session with breaks locked, bed alarm set, and all needs within reach. Patient missed 12 min of skilled PT due to fatigue, RN made aware. Will attempt to make-up missed time as able.     Therapy Documentation Precautions:  Precautions Precautions: Fall Precaution Comments: R>L lower extremity weakness, pt is legally blind, has foley catheter Restrictions Weight Bearing Restrictions: No    Therapy/Group: Individual Therapy  Alveda Vanhorne L Cheyenne Schumm PT, DPT  06/27/2021, 4:04 PM

## 2021-06-27 NOTE — Care Management (Signed)
[facility] Individual Statement of Services  Patient Name:  Parker Odonnell.  Date:  06/27/2021  Welcome to the [facility].  Our goal is to provide you with an individualized program based on your diagnosis and situation, designed to meet your specific needs.  With this comprehensive rehabilitation program, you will be expected to participate in at least 3 hours of rehabilitation therapies Monday-Friday, with modified therapy programming on the weekends.  Your rehabilitation program will include the following services:  Physical Therapy (PT), Occupational Therapy (OT), Speech Therapy (ST), 24 hour per day rehabilitation nursing, Therapeutic Recreaction (TR), Psychology, Neuropsychology, Care Coordinator, Rehabilitation Medicine, [place], and Other  Weekly team conferences will be held on Tuesdays to discuss your progress.  Your Inpatient Rehabilitation Care Coordinator will talk with you frequently to get your input and to update you on team discussions.  Team conferences with you and your family in attendance may also be held.  Expected length of stay: 2-3 weeks  Overall anticipated outcome: Contact Guard  Depending on your progress and recovery, your program may change. Your Inpatient Rehabilitation Care Coordinator will coordinate services and will keep you informed of any changes. Your Inpatient Rehabilitation Care Coordinator's name and contact numbers are listed  below.  The following services may also be recommended but are not provided by the [facility] will be made to provide these services after discharge if needed.  Arrangements include referral to agencies that provide these services.  Your insurance has been verified to be:  [facility]  Your primary  doctor is:  Lona Kettle  Pertinent information will be shared with your doctor and your insurance company.  Inpatient Rehabilitation Care Coordinator:  Cathleen Corti [phone] or [phone]  Information discussed with and copy given to patient by: Rana Snare, 06/27/2021, 8:56 AM

## 2021-06-27 NOTE — Progress Notes (Signed)
Speech Language Pathology Daily Session Note  Patient Details  Name: Parker Odonnell. MRN: [id number] Date of Birth: Oct 09, 1932  Today's Date: 06/27/2021 SLP Individual Time: 0815-0900 SLP Individual Time Calculation (min): 45 min  Short Term Goals: Week 1: SLP Short Term Goal 1 (Week 1): Patient will verbalize orientation to place, time and situation with Mod A multimodal cues. SLP Short Term Goal 2 (Week 1): Patient will demonstrate functional problem solving with Mod A multimodal cues. SLP Short Term Goal 3 (Week 1): Patient will identify 2 cognitive and 2 physical deficits with Mod verbal cues.  Skilled Therapeutic Interventions: Skilled treatment session focused on cognitive goals. Upon arrival, patient was awake while upright in the tilt-in-space wheelchair with his breakfast tray in front. SLP facilitated session by providing overall Min verbal cues for initiation, problem solving and attention to self-feeding. Patient intermittently distracted by discomfort in chair, SLP utilized repositioning and distraction. Patient also reported gum pain which impacted his ability to consume his breakfast. SLP place patient 's dentures in a denture cup of warm water with a soaking tablet and educated patient on importance of removing and cleaning his dentures at night. SLP also provided patient with a large, brightly colored calendar that he utilized with extra time for orientation to date. Patient left upright in wheelchair with alarm on and all needs within reach. Continue with current plan of care.      Pain Patient reports discomfort in wheelchair, patient repositioned multiple times and SLP also utilized distraction.   Therapy/Group: Individual Therapy  Charlotte Fidalgo 06/27/2021, 2:15 PM

## 2021-06-27 NOTE — Progress Notes (Signed)
Occupational Therapy Session Note  Patient Details  Name: Parker Odonnell. MRN: [id number] Date of Birth: 23-Jun-1932  Today's Date: 06/27/2021 OT Individual Time: [id number] OT Individual Time Calculation (min): 35 min    Short Term Goals: Week 1:  OT Short Term Goal 1 (Week 1): Patient will perform sit<>stand with max A of 1 and LRAD in preparation for BADL tasks. OT Short Term Goal 2 (Week 1): Patient will perform 1 step of LB dressing task OT Short Term Goal 3 (Week 1): Pt will don shirt with min A  Skilled Therapeutic Interventions/Progress Updates:  Pt greeted seated in w/c agreeable to OT intervention. Session focus on sit<>stands in stedy and BUE therex. Pt completed x5 sit<>stands in stedy with MIN A, pt able to tolerate standing 30 seconds during 1/4 trials, pt additionally able to reach dynamically during standing trials with MIN A for balance. Pt completed therex below with 3lb dowel rod: X10 reps of chest presses X10 reps of shoulder flexion to 90* X10 reps of rows forward<>backward X10 reps of bicep curls  Pt left seated in w/c with alarm belt activated and all needs within reach.                              Therapy Documentation Precautions:  Precautions Precautions: Fall Precaution Comments: R>L lower extremity weakness, pt is legally blind, has foley catheter Restrictions Weight Bearing Restrictions: No   Pain: Pt reports no pain during session.    Therapy/Group: Individual Therapy  Precious Haws 06/27/2021, 12:43 PM

## 2021-06-28 NOTE — Progress Notes (Signed)
PROGRESS NOTE   Subjective/Complaints: Slept better. Feels that pain in right leg has improved somewhat. [place] helped his gums  ROS: Patient denies fever, rash, sore throat, blurred vision, nausea, vomiting, diarrhea, cough, shortness of breath or chest pain, headache, or mood change. .   Objective:   No results found. Recent Labs    06/26/21 0555  WBC 5.3  HGB 10.0*  HCT 29.7*  PLT 143*   Recent Labs    06/26/21 0555  NA 133*  K 3.7  CL 103  CO2 25  GLUCOSE 108*  BUN 13  CREATININE 0.93  CALCIUM 8.9    Intake/Output Summary (Last 24 hours) at 06/28/2021 1140 Last data filed at 06/28/2021 1100 Gross per 24 hour  Intake 820 ml  Output 1826 ml  Net -1006 ml     Pressure Injury 06/25/21 Buttocks Stage 2 -  Partial thickness loss of dermis presenting as a shallow open injury with a red, pink wound bed without slough. (Active)  06/25/21 1820  Location: Buttocks  Location Orientation:   Staging: Stage 2 -  Partial thickness loss of dermis presenting as a shallow open injury with a red, pink wound bed without slough.  Wound Description (Comments):   Present on Admission: Yes    Physical Exam: Vital Signs Blood pressure 110/61, pulse 70, temperature 97.7 F (36.5 C), temperature source Oral, resp. rate 18, height '5\' 8"'$  (1.727 m), weight 69.3 kg, SpO2 95 %.  Constitutional: No distress . Vital signs reviewed. HEENT: NCAT, EOMI, oral membranes moist Neck: supple Cardiovascular: RRR without murmur. No JVD    Respiratory/Chest: CTA Bilaterally without wheezes or rales. Normal effort    GI/Abdomen: BS +, non-tender, non-distended Ext: no clubbing, cyanosis, or edema Psych: pleasant and cooperative  Skin: numerous abrasions R>L LE's, small buttock pressure wound Neuro:  Alert and oriented x 3. Fair insight. STM deficits. Follows one step commands without any issues.. Normal language and speech. Cranial nerve exam  unremarkable. UE 5/5. LLE 3-/5 prox to 4/5 distally. RLE 1-2/5 prox to distal and limited by pain. Decreased LT/PP R>L LE's--no motor or sensory changes Musculoskeletal: right leg sensitive, but less tender today   Assessment/Plan: 1. Functional deficits which require 3+ hours per day of interdisciplinary therapy in a comprehensive inpatient rehab setting. Physiatrist is providing close team supervision and 24 hour management of active medical problems listed below. Physiatrist and rehab team continue to assess barriers to discharge/monitor patient progress toward functional and medical goals  Care Tool:  Bathing    Body parts bathed by patient: Right arm, Chest, Left arm, Abdomen, Front perineal area, Face, Right upper leg, Left upper leg   Body parts bathed by helper: Right lower leg, Left lower leg, Buttocks     Bathing assist Assist Level: Maximal Assistance - Patient 24 - 49%     Upper Body Dressing/Undressing Upper body dressing   What is the patient wearing?: Pull over shirt    Upper body assist Assist Level: Moderate Assistance - Patient 50 - 74%    Lower Body Dressing/Undressing Lower body dressing      What is the patient wearing?: Pants     Lower body assist  Assist for lower body dressing: Total Assistance - Patient < 25%     Toileting Toileting    Toileting assist Assist for toileting: Maximal Assistance - Patient 25 - 49%     Transfers Chair/bed transfer  Transfers assist     Chair/bed transfer assist level: Dependent - mechanical lift     Locomotion Ambulation   Ambulation assist      Assist level: 2 helpers Assistive device: Walker-rolling Max distance: 2 ft   Walk 10 feet activity   Assist  Walk 10 feet activity did not occur: Safety/medical concerns        Walk 50 feet activity   Assist Walk 50 feet with 2 turns activity did not occur: Safety/medical concerns         Walk 150 feet activity   Assist Walk 150 feet  activity did not occur: Safety/medical concerns         Walk 10 feet on uneven surface  activity   Assist Walk 10 feet on uneven surfaces activity did not occur: Safety/medical concerns         Wheelchair     Assist     Wheelchair activity did not occur: Safety/medical concerns         Wheelchair 50 feet with 2 turns activity    Assist    Wheelchair 50 feet with 2 turns activity did not occur: Safety/medical concerns       Wheelchair 150 feet activity     Assist  Wheelchair 150 feet activity did not occur: Safety/medical concerns       Blood pressure 110/61, pulse 70, temperature 97.7 F (36.5 C), temperature source Oral, resp. rate 18, height '5\' 8"'$  (1.727 m), weight 69.3 kg, SpO2 95 %.  Medical Problem List and Plan: 1.  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  IV acyclovir completed today 06/25/2021             -patient may shower             -ELOS/Goals: 10-14 days/supervision           -Continue CIR therapies including PT, OT, and SLP  2.  Antithrombotics: -DVT/anticoagulation: [employer]: Other (comment)             -antiplatelet therapy: N/A 3. Pain Management: still with substantial neuropathic pain R>L LE -Lyrica  -increased to '75mg'$  bid--  -continue tramadol as needed             9/10pain improved 4. Mood: Provide emotional support             -antipsychotic agents: N/A 5. Neuropsych: This patient is capable of making decisions on his own behalf. 6. Skin/Wound Care: Routine skin checks 7. Fluids/Electrolytes/Nutrition: Routine in and outs             encourage po     -recheck labs Monday  8.  Viral leg rash/?vasculitis.  All lesions fully crusted.  Isolation discontinued  -skin bx --perivascular dermatitis  -rx pain 9.  Hyponatremia.  Felt to be secondary dehydration resolved after IV fluids.                133 9/8 10.  Persistent atrial fibrillation.  Cardiac rate controlled.  Continue Coreg 6.25 mg twice daily/Eliquis              Monitor with increased activity 11.  Diastolic congestive heart failure.  Ejection fraction 35 to 40% echocardiogram 2020.     -stable weights-still not recorded though [employer]  06/25/21 1826 06/25/21 1834  Weight: 69.3 kg 69.3 kg    12.  Hypertension.  Norvasc 10 mg daily             9/9 bp controlled 13.  BPH/mild hydronephrosis.  Flomax 0.4 mg daily... Maintain Foley tube until follow-up outpatient urology.  Patient failed voiding trial 06/21/2021 14.  Hyperlipidemia: Lipitor 15.  L1 superior endplate compression fracture/mild edema with spinous process T4-T5, sacral insufficiency fracture.  Supportive care 16.  5 mm right lower lobe pulmonary nodule versus intrapulmonary lymph node.  Repeat noncontrast CT chest 12 months    LOS: 3 days A FACE TO FACE EVALUATION WAS PERFORMED  Meredith Staggers 06/28/2021, 11:40 AM

## 2021-06-28 NOTE — Progress Notes (Signed)
Speech Language Pathology Daily Session Note  Patient Details  Name: Parker Odonnell. MRN: [id number] Date of Birth: 1932-01-09  Today's Date: 06/28/2021 SLP Individual Time: 1435-1530 SLP Individual Time Calculation (min): 55 min  Short Term Goals: Week 1: SLP Short Term Goal 1 (Week 1): Patient will verbalize orientation to place, time and situation with Mod A multimodal cues. SLP Short Term Goal 2 (Week 1): Patient will demonstrate functional problem solving with Mod A multimodal cues. SLP Short Term Goal 3 (Week 1): Patient will identify 2 cognitive and 2 physical deficits with Mod verbal cues.  Skilled Therapeutic Interventions:   Patient seen for skilled ST session with focus on cognitive function goals. Patient in bed but awake and alert. SLP assisted patient with repositioning in bed as he had slid down. When asked what he had done today, he demonstrated moving around in bed a little but that he had not had any therapy. SLP reviewed his schedule and he was correct. Patient required use of calendar to orient to date. During session he would frequently be leaning to right side and when asked, he reported that it was more comfortable. He did not make eye contact when SLP sitting on left side of bed. He demonstrated delayed recall of date after approximately 5-7 minutes and did recall SLP's name at end of session (approximately 40 minutes delay). He recalled what he ate for breakfast but when SLP let him know that he had not eaten anything of his lunch, patient then seemed disoriented to time, not knowing it was well past lunchtime. He could be very verbose and rambling at times and required verbal cues to redirect. SLP wrote patient's schedule for next date in large print black marker on white paper, which he was able to read (visual impairment.) Patient continues to benefit from skilled SLP intervention to maximize cognitive function prior to discharge.  Pain Pain Assessment Pain Scale:  0-10 Faces Pain Scale: No hurt  Therapy/Group: Individual Therapy  Sonia Baller, MA, CCC-SLP Speech Therapy

## 2021-06-29 NOTE — Progress Notes (Signed)
Occupational Therapy Session Note  Patient Details  Name: Parker Odonnell. MRN: [id number] Date of Birth: 1932/03/11  Today's Date: 06/29/2021 OT Individual Time: [id number]   &   [id number] OT Individual Time Calculation (min): 55 min &  45 min   Short Term Goals: Week 1:  OT Short Term Goal 1 (Week 1): Patient will perform sit<>stand with max A of 1 and LRAD in preparation for BADL tasks. OT Short Term Goal 2 (Week 1): Patient will perform 1 step of LB dressing task OT Short Term Goal 3 (Week 1): Pt will don shirt with min A  Skilled Therapeutic Interventions/Progress Updates:    AM session:   Patient in bed, alert and cooperative.  He notes pain under control at this time in right leg.  Supine to sitting edge of bed with mod A, cues for mechanics.  Posterior lean in unsupported sitting requiring min/mod A at times to maintain upright posture.  Completed grooming, bathing and dressing tasks seated edge of bed - grooming/oral care with set up, UB bathing/dressing min A, mod/max A for LB bathing/dressing, clothing management in stance dependent.  All adl tasks with increased time.  Sit to stand from edge of bed with min A of one and stand by of 2nd.  He is unable to maintain weight on right leg to side step.  Scooting side to side on edge of bed with min A.   Sit to supine max A, cues for body position.  Worked on scooting up in bed with mod A.   He remained in bed at close of session, bed alarm set and call bell in hand.      PM session:   Patient seated in w/c.  He denies pain at this time.  Set up for shaving task, assisted with shampoo cap.   Sit to stand from low w/c with min/mod A attempted x2 to pivot with RW to bed surface but unable to motor plan.  Completed max A sit pivot w/c to bed.    Completed unsupported sitting reach and trunk mobility activities with CS.  Sit to supine with mod/max A and moderate cues for body position.  He remained in bed at close of session, bed alarm set and  call bell in hand.        Therapy Documentation Precautions:  Precautions Precautions: Fall Precaution Comments: R>L lower extremity weakness, pt is legally blind, has foley catheter Restrictions Weight Bearing Restrictions: No   Therapy/Group: Individual Therapy  Carlos Levering 06/29/2021, 7:35 AM

## 2021-06-29 NOTE — Progress Notes (Signed)
Physical Therapy Session Note  Patient Details  Name: Parker Odonnell. MRN: [id number] Date of Birth: May 12, 1932  Today's Date: 06/29/2021 PT Individual Time: 1115-1230 PT Individual Time Calculation (min): 75 min   Short Term Goals: Week 1:  PT Short Term Goal 1 (Week 1): Patient will perform bed mobility with supervision using hospital bed features. PT Short Term Goal 2 (Week 1): Patient will perform basic transfers with min A consistently. PT Short Term Goal 3 (Week 1): Patient will ambulate >10 feet with +2 assist.  Skilled Therapeutic Interventions/Progress Updates:     Patient in bed upon PT arrival. Patient alert and agreeable to PT session. Patient denied pain during session.  Provided orientation to situation during session and education on patient's deficits and limitations in functional mobility. Patient demonstrated frustration at deficits during session, PT provided positive reinforcement and encouragement with good results throughout session.   Therapeutic Activity: Bed Mobility: Patient performed supine to sit with supervision. Provided verbal cues for bending his knees to chest to roll to side-lying then push to sit up. Transfers: Patient performed stand pivot bed>w/c with mod A +2 due to fear of falling and decreased motor planning R>L lower extremity and dependent transfer w/c>w/c using Stedy with CGA-min A.   Wheelchair Mobility:  Patient propelled wheelchair 80 feet with supervision with intermittent veering L. Provided verbal cues for increased stroke length L>R and L attention to reduce risk of hitting the wall. Patient sat in standard manual w/c x5 min during seated rest break and patient education. Patient with posterior pelvic tilt and poor trunk control leading to forward sliding in the chair over time. Switched patient to [facility] w/c for posterior tilt of the seat to reduce sliding and improve safety in sitting.   Neuromuscular Re-ed: Patient performed the  following lower extremity motor control activities: -Focused on sit to stand sequencing and facilitation for forward weight shift and R lower extremity extension using B HHA x2 and 3 musketeer technique x2 with min-mod A +2, RW x3 and // bars x1 with min A of 1 and second person SBA; patient with poor proprioception of R limb often lifting it off the ground or pushing himself backwards in standing; performed standing balance 10-60 seconds each trial; performed weight shifting and attempts to step forward with each foot with poor awareness of what R limb was doing and max facilitation for stepping and standing with R  Patient in [place] w/c at end of session with breaks locked, seat belt alarm set, and set-up for lunch.   Therapy Documentation Precautions:  Precautions Precautions: Fall Precaution Comments: R>L lower extremity weakness, pt is legally blind, has foley catheter Restrictions Weight Bearing Restrictions: No   Therapy/Group: Individual Therapy  Cormac Wint L [place] PT, DPT  06/29/2021, 4:14 PM

## 2021-06-30 LAB — BASIC METABOLIC PANEL
Anion gap: 5 (ref 5–15)
BUN: 13 mg/dL (ref 8–23)
CO2: 27 mmol/L (ref 22–32)
Calcium: 9.5 mg/dL (ref 8.9–10.3)
Chloride: 105 mmol/L (ref 98–111)
Creatinine, Ser: 0.92 mg/dL (ref 0.61–1.24)
GFR, Estimated: >60 mL/min (ref >60)
Glucose, Bld: 109 mg/dL — ABNORMAL HIGH (ref 70–99)
Potassium: 5.4 mmol/L — ABNORMAL HIGH (ref 3.5–5.1)
Sodium: 137 mmol/L (ref 135–145)

## 2021-06-30 MED ORDER — CLONIDINE HCL 0.1 MG/24HR TD PTWK
0.1000 mg | MEDICATED_PATCH | TRANSDERMAL | Status: DC
  Administered 2021-06-30 – 2021-07-14 (×3): 0.1 mg via TRANSDERMAL
  Filled 2021-06-30 (×3): qty 1

## 2021-06-30 MED ORDER — LIDOCAINE 5 % EX PTCH
1.0000 | MEDICATED_PATCH | CUTANEOUS | Status: DC
  Administered 2021-06-30 – 2021-07-18 (×19): 1 via TRANSDERMAL
  Filled 2021-06-30 (×19): qty 1

## 2021-06-30 MED ORDER — SODIUM POLYSTYRENE SULFONATE 15 GM/60ML PO SUSP
15.0000 g | Freq: Once | ORAL | Status: AC
  Administered 2021-06-30: 15 g via ORAL
  Filled 2021-06-30: qty 60

## 2021-06-30 NOTE — Progress Notes (Signed)
Occupational Therapy Session Note  Patient Details  Name: Parker Odonnell. MRN: [id number] Date of Birth: 1932-01-17  Today's Date: 06/30/2021 OT Individual Time: 9969-2493 OT Individual Time Calculation (min): 48 min    Short Term Goals: Week 1:  OT Short Term Goal 1 (Week 1): Patient will perform sit<>stand with max A of 1 and LRAD in preparation for BADL tasks. OT Short Term Goal 2 (Week 1): Patient will perform 1 step of LB dressing task OT Short Term Goal 3 (Week 1): Pt will don shirt with min A  Skilled Therapeutic Interventions/Progress Updates:  Patient met seated in wc in agreement with OT treatment session. Reports generalized pain and stiffness "all over". Pain addressed with med management prior to OT entry. Wheelchair mobility 156ft+ with increased time/effort. Patient able to lock/unlock breaks with Min cues prior to and after rest breaks. BUE HEP with 4lb med ball as detailed below. Total A for wc transfer back to hospital room for energy conservation and time management. Attempted stand-pivot to EOB but patient unable 2/2 severe fear of falling and fatigue. Use of Stedy for return to supine with patient able to stand from wc with heavy Min A. Session concluded with patient lying supine in bed with call bell within reach, bed alarm activated and all needs met. Filled out patient memory book at bedside.   Therapy Documentation Precautions:  Precautions Precautions: Fall Precaution Comments: R>L lower extremity weakness, pt is legally blind, has foley catheter Restrictions Weight Bearing Restrictions: No General:    Therapy/Group: Individual Therapy  [name] R [place] 06/30/2021, 2:26 PM

## 2021-06-30 NOTE — Progress Notes (Signed)
Physical Therapy Session Note  Patient Details  Name: Parker Odonnell. MRN: [id number] Date of Birth: [birth year]  Today's Date: 06/30/2021 PT Individual Time: 1005-1100 PT Individual Time Calculation (min): 55 min   Short Term Goals: Week 1:  PT Short Term Goal 1 (Week 1): Patient will perform bed mobility with supervision using hospital bed features. PT Short Term Goal 2 (Week 1): Patient will perform basic transfers with min A consistently. PT Short Term Goal 3 (Week 1): Patient will ambulate >10 feet with +2 assist.  Skilled Therapeutic Interventions/Progress Updates:     Patient in bed upon PT arrival. Patient alert and agreeable to PT session. Patient reported 3-4/10 R knee pain during session, RN made aware. PT provided repositioning, rest breaks, and distraction as pain interventions throughout session.   Therapeutic Activity: Bed Mobility: Patient performed supine to sit with supervision and increased time in a flat bed without use of bed rails.  Transfers: Patient performed Stedy transfers bed>BSC and BSC to w/c with CGA for balance, noted improved posture and R knee/hip extension in [place] today. Patient unable to have a BM on the [facility] Performed peri-care and lower body clothing management in standing with CGA for sanding balance in the [place]. Provided verbal cues for hand placement and initiation to stand using the Stedy.  Wheelchair Mobility:  Patient propelled wheelchair >100 feet with supervision using B upper extremities. Noted improved correction of L veering and easier propulsion in lighter weight [place] w/c. Provided min cues for propulsion technique and turning technique.   Neuromuscular Re-ed: Patient performed the following lower extremity motor control activities for improved balance and weight shift with functional mobility: -sit to/from stand x4 in // bars focused on forward weight shift, R knee extension, and erect posture, required min A to block R knee to  promote knee extension and prevent buckling -standing balance 3x45-60 sec progressing to lateral weight shifts focused on R quad and gluteal activation with increased weight bearing -ambulated 5 feet in // bars with min A for balance and mod A for R foot placement and blocking R knee in stance  Patient very fatigued by each activity performed and required prolonged rest breaks between activities. Patient oriented to day/month, location, and self, continues to have difficulty recalling situation.   Patient in w/c in 20 deg tilt in the room at end of session with breaks locked, seat belt alarm set, and all needs within reach.   Therapy Documentation Precautions:  Precautions Precautions: Fall Precaution Comments: R>L lower extremity weakness, pt is legally blind, has foley catheter Restrictions Weight Bearing Restrictions: No    Therapy/Group: Individual Therapy  Murel Wigle L Yuta C[place] PT, DPT  06/30/2021, 3:54 PM

## 2021-06-30 NOTE — Progress Notes (Signed)
PROGRESS NOTE   Subjective/Complaints: C/o knee pain No other complaints Going to gym with Cherie K+ up to 5.4  ROS: Patient denies fever, rash, sore throat, blurred vision, nausea, vomiting, diarrhea, cough, shortness of breath or chest pain, headache, or mood change. .   Objective:   No results found. No results for input(s): WBC, HGB, HCT, PLT in the last 72 hours.  Recent Labs    06/30/21 0636  NA 137  K 5.4*  CL 105  CO2 27  GLUCOSE 109*  BUN 13  CREATININE 0.92  CALCIUM 9.5    Intake/Output Summary (Last 24 hours) at 06/30/2021 1328 Last data filed at 06/30/2021 1305 Gross per 24 hour  Intake 830 ml  Output 1600 ml  Net -770 ml     Pressure Injury 06/25/21 Buttocks Stage 2 -  Partial thickness loss of dermis presenting as a shallow open injury with a red, pink wound bed without slough. (Active)  06/25/21 1820  Location: Buttocks  Location Orientation:   Staging: Stage 2 -  Partial thickness loss of dermis presenting as a shallow open injury with a red, pink wound bed without slough.  Wound Description (Comments):   Present on Admission: Yes    Physical Exam: Vital Signs Blood pressure (!) 149/75, pulse 69, temperature 97.8 F (36.6 C), temperature source Oral, resp. rate 16, height '5\' 8"'$  (1.727 m), weight 69.3 kg, SpO2 98 %.  Constitutional: No distress . Vital signs reviewed. HEENT: NCAT, EOMI, oral membranes moist Neck: supple Cardiovascular: RRR without murmur. No JVD    Respiratory/Chest: CTA Bilaterally without wheezes or rales. Normal effort    GI/Abdomen: BS +, non-tender, non-distended Ext: no clubbing, cyanosis, or edema Psych: pleasant and cooperative  Skin: numerous abrasions R>L LE's, small buttock pressure wound stage 2 Neuro:  Alert and oriented x 3. Fair insight. STM deficits. Follows one step commands without any issues.. Normal language and speech. Cranial nerve exam unremarkable.  UE 5/5. LLE 3-/5 prox to 4/5 distally. RLE 1-2/5 prox to distal and limited by pain. Decreased LT/PP R>L LE's--no motor or sensory changes Musculoskeletal: right leg sensitive, but less tender today   Assessment/Plan: 1. Functional deficits which require 3+ hours per day of interdisciplinary therapy in a comprehensive inpatient rehab setting. Physiatrist is providing close team supervision and 24 hour management of active medical problems listed below. Physiatrist and rehab team continue to assess barriers to discharge/monitor patient progress toward functional and medical goals  Care Tool:  Bathing    Body parts bathed by patient: Right arm, Chest, Left arm, Abdomen, Front perineal area, Face, Right upper leg, Left upper leg   Body parts bathed by helper: Right lower leg, Left lower leg, Buttocks     Bathing assist Assist Level: Moderate Assistance - Patient 50 - 74%     Upper Body Dressing/Undressing Upper body dressing   What is the patient wearing?: Pull over shirt    Upper body assist Assist Level: Minimal Assistance - Patient > 75%    Lower Body Dressing/Undressing Lower body dressing      What is the patient wearing?: Pants, Incontinence brief     Lower body assist Assist for lower  body dressing: Maximal Assistance - Patient 25 - 49%     Toileting Toileting    Toileting assist Assist for toileting: Maximal Assistance - Patient 25 - 49%     Transfers Chair/bed transfer  Transfers assist     Chair/bed transfer assist level: Dependent - mechanical lift     Locomotion Ambulation   Ambulation assist      Assist level: 2 helpers Assistive device: Walker-rolling Max distance: 2 ft   Walk 10 feet activity   Assist  Walk 10 feet activity did not occur: Safety/medical concerns        Walk 50 feet activity   Assist Walk 50 feet with 2 turns activity did not occur: Safety/medical concerns         Walk 150 feet activity   Assist Walk 150  feet activity did not occur: Safety/medical concerns         Walk 10 feet on uneven surface  activity   Assist Walk 10 feet on uneven surfaces activity did not occur: Safety/medical concerns         Wheelchair     Assist Is the patient using a wheelchair?: Yes   Wheelchair activity did not occur: Safety/medical concerns         Wheelchair 50 feet with 2 turns activity    Assist    Wheelchair 50 feet with 2 turns activity did not occur: Safety/medical concerns       Wheelchair 150 feet activity     Assist  Wheelchair 150 feet activity did not occur: Safety/medical concerns       Blood pressure (!) 149/75, pulse 69, temperature 97.8 F (36.6 C), temperature source Oral, resp. rate 16, height '5\' 8"'$  (1.727 m), weight 69.3 kg, SpO2 98 %.  Medical Problem List and Plan: 1.  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  IV acyclovir completed today 06/25/2021             -patient may shower             -ELOS/Goals: 10-14 days/supervision           -Continue CIR therapies including PT, OT, and SLP  2.  Antithrombotics: -DVT/anticoagulation: [employer]: Other (comment)             -antiplatelet therapy: N/A 3. Neuropathic pain R>L LE: -Lyrica  -increased to '75mg'$  bid--  -continue tramadol as needed             9/10pain improved  Lidoderm patch ordered.  4. Mood: Provide emotional support             -antipsychotic agents: N/A 5. Neuropsych: This patient is capable of making decisions on his own behalf. 6. Skin/Wound Care: Routine skin checks 7. Fluids/Electrolytes/Nutrition: Routine in and outs             encourage po     -recheck labs Monday  8.  Viral leg rash/?vasculitis.  All lesions fully crusted.  Isolation discontinued  -skin bx --perivascular dermatitis  -rx pain 9.  Hyponatremia.  Felt to be secondary dehydration resolved after IV fluids.                133 9/8, 137 on 9/12, repeat tomorrow 10.  Persistent atrial fibrillation.  Cardiac  rate controlled.  Continue Coreg 6.25 mg twice daily/Eliquis             Monitor with increased activity 11.  Diastolic congestive heart failure.  Ejection fraction 35 to 40% echocardiogram 2020.     -  stable weights-still not recorded though [facility] Weights   06/25/21 1826 06/25/21 1834  Weight: 69.3 kg 69.3 kg    12.  Hypertension. Stop Norvasc. Add clonidine patch right lower extremity for HTN and neuropathic pain 13.  BPH/mild hydronephrosis.  Continue Flomax 0.4 mg daily... Maintain Foley tube until follow-up outpatient urology.  Patient failed voiding trial 06/21/2021 14.  Hyperlipidemia: Lipitor 15.  L1 superior endplate compression fracture/mild edema with spinous process T4-T5, sacral insufficiency fracture.  Supportive care 16.  5 mm right lower lobe pulmonary nodule versus intrapulmonary lymph node.  Repeat noncontrast CT chest 12 months 17. Hyperkalemia: kayexalate 15 today, repeat K+ tomorrow    LOS: 5 days A FACE TO FACE EVALUATION WAS PERFORMED  Ailyn Gladd P Gillie Fleites 06/30/2021, 1:28 PM

## 2021-06-30 NOTE — Progress Notes (Signed)
Physical Therapy Session Note  Patient Details  Name: Parker Odonnell. MRN: [id number] Date of Birth: 1932/05/30  Today's Date: 06/30/2021 PT Missed Time: 25 Minutes Missed Time Reason: Patient fatigue  Short Term Goals: Week 1:  PT Short Term Goal 1 (Week 1): Patient will perform bed mobility with supervision using hospital bed features. PT Short Term Goal 2 (Week 1): Patient will perform basic transfers with min A consistently. PT Short Term Goal 3 (Week 1): Patient will ambulate >10 feet with +2 assist.  Skilled Therapeutic Interventions/Progress Updates:     Pt supine asleep with RN in room, who reports that pt has been up majority of day and is very fatigued. PT will follow up as appropriate.  Therapy Documentation Precautions:  Precautions Precautions: Fall Precaution Comments: R>L lower extremity weakness, pt is legally blind, has foley catheter Restrictions Weight Bearing Restrictions: No General: PT Amount of Missed Time (min): 30 Minutes PT Missed Treatment Reason: Patient fatigue   Therapy/Group: Individual Therapy  Breck Coons, PT, DPT 06/30/2021, 3:22 PM

## 2021-06-30 NOTE — Progress Notes (Signed)
Speech Language Pathology Daily Session Note  Patient Details  Name: Parker Odonnell. MRN: [id number] Date of Birth: 30-Jul-1932  Today's Date: 06/30/2021 SLP Individual Time: 0825-0905 SLP Individual Time Calculation (min): 40 min  Short Term Goals: Week 1: SLP Short Term Goal 1 (Week 1): Patient will verbalize orientation to place, time and situation with Mod A multimodal cues. SLP Short Term Goal 2 (Week 1): Patient will demonstrate functional problem solving with Mod A multimodal cues. SLP Short Term Goal 3 (Week 1): Patient will identify 2 cognitive and 2 physical deficits with Mod verbal cues.  Skilled Therapeutic Interventions: Skilled treatment session focused on cognitive goals. SLP facilitated session by providing a visual aid for orientation to time that patient utilized with Min verbal cues. Patient required total A for orientation to situation and place, therefore, visual aids were created to assist in recall. SLP also provided patient with a larger schedule for anticipation of upcoming therapy appointments that he utilized with Min verbal cues. Patient independently recalled very vague goals of each therapy and demonstrated increased intellectual awareness of physical deficits. However, patient continues to require total A for awareness of cognitive deficits. Patient left upright in bed with alarm on and all needs within reach. Continue with current plan of care.      Pain No/Denies Pain   Therapy/Group: Individual Therapy  Parker Odonnell 06/30/2021, 3:07 PM

## 2021-07-01 ENCOUNTER — Encounter (HOSPITAL_COMMUNITY): Payer: Medicare Other

## 2021-07-01 DIAGNOSIS — I5032 Chronic diastolic (congestive) heart failure: Secondary | ICD-10-CM

## 2021-07-01 DIAGNOSIS — M792 Neuralgia and neuritis, unspecified: Secondary | ICD-10-CM

## 2021-07-01 DIAGNOSIS — E871 Hypo-osmolality and hyponatremia: Secondary | ICD-10-CM

## 2021-07-01 DIAGNOSIS — E875 Hyperkalemia: Secondary | ICD-10-CM

## 2021-07-01 LAB — BASIC METABOLIC PANEL
Anion gap: 8 (ref 5–15)
BUN: 10 mg/dL (ref 8–23)
CO2: 24 mmol/L (ref 22–32)
Calcium: 8.8 mg/dL — ABNORMAL LOW (ref 8.9–10.3)
Chloride: 104 mmol/L (ref 98–111)
Creatinine, Ser: 0.84 mg/dL (ref 0.61–1.24)
GFR, Estimated: >60 mL/min (ref >60)
Glucose, Bld: 95 mg/dL (ref 70–99)
Potassium: 4.1 mmol/L (ref 3.5–5.1)
Sodium: 136 mmol/L (ref 135–145)

## 2021-07-01 MED ORDER — PREGABALIN 25 MG PO CAPS
75.0000 mg | ORAL_CAPSULE | Freq: Three times a day (TID) | ORAL | Status: DC
  Administered 2021-07-01 – 2021-07-08 (×21): 75 mg via ORAL
  Filled 2021-07-01 (×21): qty 3

## 2021-07-01 NOTE — Progress Notes (Addendum)
Physical Therapy Session Note  Patient Details  Name: Parker Odonnell. MRN: [id number] Date of Birth: 16-Aug-1932  Today's Date: 07/01/2021 PT Co-Treatment Time: 0930-1000 PT Co-Treatment Time Calculation (min): 30 min  Short Term Goals: Week 1:  PT Short Term Goal 1 (Week 1): Patient will perform bed mobility with supervision using hospital bed features. PT Short Term Goal 2 (Week 1): Patient will perform basic transfers with min A consistently. PT Short Term Goal 3 (Week 1): Patient will ambulate >10 feet with +2 assist.  Skilled Therapeutic Interventions/Progress Updates:     Patient in bed with OT in the room for co-treat upon PT arrival. Patient alert and agreeable to OT/PT co-treat. Patient without reports of pain during session. Continues to report fatigue and requires increased rest breaks. Demonstrates increased fear of falling and reduced confidence with functional ability at this time, requiring increased encouragement and positive reinforcement throughout session.   PT focused on sitting balance/posture during ADL task performed wit OT. Patient sat EOB with supervision and increased time in a flat bed without use of bed rails with cues for transitioning through side-lying for reduced effort with mobility. Patient sat EOB >20 min with CGA-min A during ADL task. PT provided facilitation to pelvis and rib to promote anterior pelvic tilt and R trunk elongation for improved sitting balance, reduced retropulsion, and improved forward trunk flexion to reach his legs.  Patient performed sit to/from stand holding the bar on the [place], placed backwards with breaks locked to only use bar and not shin block. Patient stood 1-2 min progressing to alternating single upper extremity support with PT facilitating R foot flat on the floor and R knee extension.   Patient attempted sit to stand with B HHA, requiring max A +2 due to patient resistance and poor motor planning due to novel task and fear of  falling. Provided patient with RW and patient performed sit to/from stand x2 with min A +2 and progressed to ambulating 8 feet with min A +2 with OT facilitating forward motion of RW and posture and PT facilitating R foot placement, R knee extension in stance, and B weight shift to promote reciprocal gait pattern.   PT adjusted leg rests to patient following gait training, while OT perform trunk mobility, see OT note for details. Patient seated in [place] w/c in the room at end of session with breaks locked, seat belt alarm set, and all needs in reach.   Therapy Documentation Precautions:  Precautions Precautions: Fall Precaution Comments: R>L lower extremity weakness, pt is legally blind, has foley catheter Restrictions Weight Bearing Restrictions: No    Therapy/Group: Co-Treatment  Rockie Vawter L Glendon Fiser PT, DPT  07/01/2021, 2:52 PM

## 2021-07-01 NOTE — Progress Notes (Signed)
Physical Therapy Session Note  Patient Details  Name: Parker Odonnell. MRN: [id number] Date of Birth: [birth year]  Today's Date: 07/01/2021 PT Individual Time: 1530-1615 PT Individual Time Calculation (min): 45 min   Short Term Goals: Week 1:  PT Short Term Goal 1 (Week 1): Patient will perform bed mobility with supervision using hospital bed features. PT Short Term Goal 2 (Week 1): Patient will perform basic transfers with min A consistently. PT Short Term Goal 3 (Week 1): Patient will ambulate >10 feet with +2 assist.  Skilled Therapeutic Interventions/Progress Updates:    Pt received seated in w/c in room, agreeable to PT session. No complaints of pain at rest, has onset of BLE pain with mobility that improves again at rest. Sit to stand in // bars with mod A with B knees blocked. Attempt to educate patient on importance of anterior weight shift and "nose over toes" but pt prefers to remain with trunk extended and pull himself to stand with use of BUE on // bars. Standing tolerance x 2 min, x 30 sec with min to mod A to maintain standing balance with // bars. Pt exhibits B knee flexion in standing, R>L. Returned to sitting. Pt determined to have tight HS bilaterally, R>L. Seated B HS stretch 3 x 60 sec each. Pt returned to standing with mod A. Ambulation x 5 ft in // bars with mod A for balance with knees blocked in stance, close w/c follow for safety. Pt exhibits anxiety regarding standing and gait due to fear of falling. Pt requests to return to room due to fatigue and to lay down in bed. Stedy transfer w/c to bed with +2 to stand. Stedy transfer to bed with pt having LOB in trunk to the L, requires min A to remain upright and +2 to transfer safely with stedy. Sit to supine mod A needed for BLE management. Pt left in L sidelying in bed with needs in reach. Pt missed 15 min of scheduled therapy session due to fatigue.  Therapy Documentation Precautions:  Precautions Precautions:  Fall Precaution Comments: R>L lower extremity weakness, pt is legally blind, has foley catheter Restrictions Weight Bearing Restrictions: No General: PT Amount of Missed Time (min): 15 Minutes PT Missed Treatment Reason: Patient fatigue     Therapy/Group: Individual Therapy   Excell Seltzer, PT, DPT, CSRS  07/01/2021, 5:02 PM

## 2021-07-01 NOTE — Progress Notes (Addendum)
PROGRESS NOTE   Subjective/Complaints: Patient seen sitting up in bed this morning working with therapies.  He states he slept well overnight.  Discussed edema and erythema with therapies and right lower extremity.  ROS: Denies CP, SOB, N/V/D  Objective:   No results found. No results for input(s): WBC, HGB, HCT, PLT in the last 72 hours.  Recent Labs    06/30/21 0636 07/01/21 0510  NA 137 136  K 5.4* 4.1  CL 105 104  CO2 27 24  GLUCOSE 109* 95  BUN 13 10  CREATININE 0.92 0.84  CALCIUM 9.5 8.8*     Intake/Output Summary (Last 24 hours) at 07/01/2021 1044 Last data filed at 07/01/2021 0900 Gross per 24 hour  Intake 540 ml  Output 2300 ml  Net -1760 ml      Pressure Injury 06/25/21 Buttocks Stage 2 -  Partial thickness loss of dermis presenting as a shallow open injury with a red, pink wound bed without slough. (Active)  06/25/21 1820  Location: Buttocks  Location Orientation:   Staging: Stage 2 -  Partial thickness loss of dermis presenting as a shallow open injury with a red, pink wound bed without slough.  Wound Description (Comments):   Present on Admission: Yes    Physical Exam: Vital Signs Blood pressure 126/66, pulse 69, temperature 98.4 F (36.9 C), resp. rate 17, height '5\' 8"'$  (1.727 m), weight 69.3 kg, SpO2 98 %. Constitutional: No distress . Vital signs reviewed. HENT: Normocephalic.  Atraumatic. Eyes: EOMI. No discharge. Cardiovascular: No JVD.  RRR. Respiratory: Normal effort.  No stridor.  Bilateral clear to auscultation. GI: Non-distended.  BS +. Skin: Warm and dry.  Sacrum not examined today.  Bilateral lower extremities with erythema. Psych: Normal mood.  Normal behavior. Musc: Right lower extremity with edema, no tenderness. Neuro: Alert and oriented x1 Follows one step commands without any issues. Motor: Bilateral UE 5/5.  LLE: 4-/5 prox to 4/5 distally.  RLE: Hip flexion 3/5, knee  extension 2/5, dorsiflexion 3/5   Assessment/Plan: 1. Functional deficits which require 3+ hours per day of interdisciplinary therapy in a comprehensive inpatient rehab setting. Physiatrist is providing close team supervision and 24 hour management of active medical problems listed below. Physiatrist and rehab team continue to assess barriers to discharge/monitor patient progress toward functional and medical goals  Care Tool:  Bathing    Body parts bathed by patient: Right arm, Chest, Left arm, Abdomen, Front perineal area, Face, Right upper leg, Left upper leg   Body parts bathed by helper: Right lower leg, Left lower leg, Buttocks     Bathing assist Assist Level: Moderate Assistance - Patient 50 - 74%     Upper Body Dressing/Undressing Upper body dressing   What is the patient wearing?: Pull over shirt    Upper body assist Assist Level: Minimal Assistance - Patient > 75%    Lower Body Dressing/Undressing Lower body dressing      What is the patient wearing?: Pants, Incontinence brief     Lower body assist Assist for lower body dressing: Maximal Assistance - Patient 25 - 49%     Toileting Toileting    Toileting assist Assist for toileting:  Maximal Assistance - Patient 25 - 49%     Transfers Chair/bed transfer  Transfers assist     Chair/bed transfer assist level: Dependent - mechanical lift     Locomotion Ambulation   Ambulation assist      Assist level: 2 helpers Assistive device: Walker-rolling Max distance: 2 ft   Walk 10 feet activity   Assist  Walk 10 feet activity did not occur: Safety/medical concerns        Walk 50 feet activity   Assist Walk 50 feet with 2 turns activity did not occur: Safety/medical concerns         Walk 150 feet activity   Assist Walk 150 feet activity did not occur: Safety/medical concerns         Walk 10 feet on uneven surface  activity   Assist Walk 10 feet on uneven surfaces activity did not  occur: Safety/medical concerns         Wheelchair     Assist Is the patient using a wheelchair?: Yes   Wheelchair activity did not occur: Safety/medical concerns         Wheelchair 50 feet with 2 turns activity    Assist    Wheelchair 50 feet with 2 turns activity did not occur: Safety/medical concerns       Wheelchair 150 feet activity     Assist  Wheelchair 150 feet activity did not occur: Safety/medical concerns       Blood pressure 126/66, pulse 69, temperature 98.4 F (36.9 C), resp. rate 17, height '5\' 8"'$  (1.727 m), weight 69.3 kg, SpO2 98 %.  Medical Problem List and Plan: 1.  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  IV acyclovir completed today 06/25/2021  Continue CIR  Team conference today 2.  Antithrombotics: -DVT/anticoagulation: [employer]quis [employer]: Other (comment)  Dopplers ordered             -antiplatelet therapy: N/A 3. Neuropathic pain R>L LE: -Lyrica  -increased to '75mg'$  bid, increased to 3 times daily on 9/13 -continue tramadol as needed  Lidoderm patch ordered.  4. Mood: Provide emotional support             -antipsychotic agents: N/A 5. Neuropsych: This patient is capable of making decisions on his own behalf. 6. Skin/Wound Care: Routine skin checks 7. Fluids/Electrolytes/Nutrition: Routine in and outs             encourage po 8.  Viral leg rash/?vasculitis.  All lesions fully crusted.  Isolation discontinued  -skin bx --perivascular dermatitis  -rx pain  See #3 9.  Hyponatremia.  Felt to be secondary dehydration resolved after IV fluids.                Sodium 136 on 9/13, continue to monitor 10.  Persistent atrial fibrillation.  Cardiac rate controlled.  Continue Coreg 6.25 mg twice daily/Eliquis             Monitor with increased activity 11.  Diastolic congestive heart failure.  Ejection fraction 35 to 40% echocardiogram 2020.    Filed Weights   06/25/21 1826 06/25/21 1834  Weight: 69.3 kg 69.3 kg     Daily weights 12.   Hypertension. Stoped Norvasc. Add clonidine patch right lower extremity for HTN and neuropathic pain.  Controlled on 9/13 13.  BPH/mild hydronephrosis.  Continue Flomax 0.4 mg daily... Maintain Foley tube until follow-up outpatient urology.  Patient failed voiding trial 06/21/2021 14.  Hyperlipidemia: Lipitor 15.  L1 superior endplate compression fracture/mild edema with spinous process  T4-T5, sacral insufficiency fracture.  Supportive care 16.  5 mm right lower lobe pulmonary nodule versus intrapulmonary lymph node.  Repeat noncontrast CT chest 12 months 17. Hyperkalemia:   Potassium 4.1 on 9/13 after Kayexalate on 8/13   LOS: 6 days A FACE TO FACE EVALUATION WAS PERFORMED  Annalucia Laino Lorie Phenix 07/01/2021, 10:44 AM

## 2021-07-01 NOTE — Patient Care Conference (Signed)
Inpatient RehabilitationTeam Conference and Plan of Care Update Date: 07/01/2021   Time: 10:19 AM    Patient Name: Parker Odonnell.      Medical Record Number: [id number]  Date of Birth: 1932/10/02 Sex: Male         Room/Bed: 5C09C/5C09C-01 Payor Info: Payor: [employer] / Plan: [facility] MEDICARE / Product Type: *No Product type* /    Admit Date/Time:  06/25/2021  5:42 PM  Primary Diagnosis:  VZV (varicella-zoster virus) infection  Hospital Problems: Principal Problem:   VZV (varicella-zoster virus) infection Active Problems:   Pressure injury of skin   Hyperkalemia   Hyponatremia   Neuropathic pain    Expected Discharge Date: Expected Discharge Date: 07/18/21  Team Members Present: Physician leading conference: Dr. Delice Lesch Social Worker Present: Loralee Pacas, [place] Nurse Present: Dorthula Nettles, RN PT Present: Apolinar Junes, PT OT Present: Cherylynn Ridges, OT SLP Present: Weston Anna, SLP PPS Coordinator present : Gunnar Fusi, SLP     Current Status/Progress Goal Weekly Team Focus  Bowel/Bladder   q  Patient will regain continence of bowel. Follow up with provider/urology to discuss removing indwelling foley catheter when appropriate.  Staff will offer toileting q2h and per patient request. Continue proper foley care to prevent urinary infection.   Swallow/Nutrition/ Hydration             ADL's   Mod to Max A overall  Supervision/CGA  pain management, functional transfers, sit <> stands, activity tolerance, stregthening   Mobility   Supervision bed mobility, Mod +2 or Stedy transfers, min A standing and gait 5 ft in // bard, R knee buckling and decreased motor planning limiting mobility  CGA overall  Strenthening, activity tolerance, balance, gait training, transfers training, R LE NMR, patient/caregiver education   Communication             Safety/Cognition/ Behavioral Observations  Mod-Max A  Min A  use of external aids for orientaiton,  recall of functional information, intellectual awareness of deficits   Pain   Patient denies pain at this time.  Patient will have a pain level of <=3  Asses patient for pain q shift and prn. Administer prn pain medication per order.   Skin   Patient has a stage two pressure area on his buttocks.  Wound to buttocks will resolve. No new pressure ulcers or skin issues.  Encourage and assist patient with turning q2h for pressure relief. Lift foam and assess wound to buttocks q shift and prn.     Discharge Planning:  Pt needs to be Mod I at d/c as he lives in [place], and has hired caregivers. SW will confirm there are no barriers to discharge.   Team Discussion: Hyponatremia improving, weight stable, continue foley. Edema of legs, will check dopplers. Incontinent bowel, foley in place. BLE nerve pain worse when touched. Stage 2 to sacrum with foam dressing. Mod/max assist overall with ADL's. Supervision/contact guard goals. Able to take 5 steps in parallel bars. Not reaching functional gains. Poor carryover. Has been disoriented, now remembers living in [place]. Using visual aids when brought into line of site.   Patient on target to meet rehab goals: no, not making functional gains.   *See Care Plan and progress notes for long and short-term goals.   Revisions to Treatment Plan:  Dopplers for DVT's.  Teaching Needs: Family education, medication management, pain management, foley care education, transfer training, gait training, stair training, balance training, endurance training, safety awareness.   Current  Barriers to Discharge: Decreased caregiver support, Medical stability, Home enviroment access/layout, Incontinence, Wound care, Lack of/limited family support, and Medication compliance  Possible Resolutions to Barriers: Continue current medications, provide emotional support.     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  IV acyclovir completed today 06/25/2021   Barriers to Discharge: Incontinence;Medical stability;Wound care;Behavior;Decreased family/caregiver support   Possible Resolutions to [employer] Focus: Therapies, Dopplers for RLE edema, optimize pain meds, follow labs - Na+   Continued Need for Acute Rehabilitation Level of Care: The patient requires daily medical management by a physician with specialized training in physical medicine and rehabilitation for the following reasons: Direction of a multidisciplinary physical rehabilitation program to maximize functional independence : Yes Medical management of patient stability for increased activity during participation in an intensive rehabilitation regime.: Yes Analysis of laboratory values and/or radiology reports with any subsequent need for medication adjustment and/or medical intervention. : Yes   I attest that I was present, lead the team conference, and concur with the assessment and plan of the team.   Cristi Loron 07/01/2021, 1:24 PM

## 2021-07-01 NOTE — Progress Notes (Signed)
Speech Language Pathology Daily Session Note  Patient Details  Name: Parker Odonnell. MRN: [id number] Date of Birth: 1932-02-24  Today's Date: 07/01/2021 SLP Individual Time: 1105-1130 SLP Individual Time Calculation (min): 25 min  Short Term Goals: Week 1: SLP Short Term Goal 1 (Week 1): Patient will verbalize orientation to place, time and situation with Mod A multimodal cues. SLP Short Term Goal 2 (Week 1): Patient will demonstrate functional problem solving with Mod A multimodal cues. SLP Short Term Goal 3 (Week 1): Patient will identify 2 cognitive and 2 physical deficits with Mod verbal cues.  Skilled Therapeutic Interventions: Skilled treatment session focused on cognitive goals. SLP facilitated session by providing supervision verbal cues for orientation to place and Min A verbal cues for use of visual aids for orientation to tome and situation. Patient was disoriented to time of day but was able to read the clock appropriately. Clock was placed on his bedside table to maximize utilization. Patient recalled events from previous therapy session with Min verbal cues and voiced appropriate frustration regarding difficylty with walking nad feeling like he "can't do what they are asking me to do." SLP provided emotional support. Patient left upright in wheelchair with alarm on and all needs within reach. Continue with current plan of care.      Pain No/Denies Pain   Therapy/Group: Individual Therapy  Jacques Willingham 07/01/2021, 12:23 PM

## 2021-07-01 NOTE — Progress Notes (Signed)
Patient ID:  Sink., male   DOB: August 02, 1932, 87 y.o.   MRN: [id number]  SW spoke with pt dtr [name] [phone]) to provide updates from team conference, and d/c date 9/30. She confirms he does have 24/7 care in which there are three separate aides that complete 8-10hr shifts. SW discussed family education with aides that will be providing care to him. Reports she will begin working on this. States she is going to look into his long term care policy as well. SW informed will continue to provide updates.   Loralee Pacas, MSW, [place] Office: [phone] Cell: [phone] Fax: [phone]

## 2021-07-01 NOTE — Progress Notes (Addendum)
Occupational Therapy Session Note  Patient Details  Name: Parker Odonnell. MRN: [id number] Date of Birth: 03-Jul-1932  Today's Date: 07/01/2021 OT Co-Treatment Time: 0900-0930    OT Co-Treatment Time Calculation (min): 60 min OT individual time:  1345-1440 OT individual time calculation (min):  55 min    Short Term Goals: Week 1:  OT Short Term Goal 1 (Week 1): Patient will perform sit<>stand with max A of 1 and LRAD in preparation for BADL tasks. OT Short Term Goal 2 (Week 1): Patient will perform 1 step of LB dressing task OT Short Term Goal 3 (Week 1): Pt will don shirt with min A  Skilled Therapeutic Interventions/Progress Updates:    AM session:   co-treat with PT.   Patient participated for first half of session on adl training in sitting position edge of bed - PT with focus on posture and seated balance while patient guided through bathing and dressing tasks - overall min/mod A for bathing - difficulty reaching feet, dependent for buttocks.  OH shirt with set up/CS, incontinence brief/pants mod A, CM mod/max A.  Foot wear max A.  Sit to stand at edge of bed varies from max A to min A of 2.   Completed standing balance and gait activity - see PT note for details.  Focus on posture and confidence for mobility.  Completed seated trunk mobility and weight shift activities.  He remained seated in w/c at close of session, seat belt alarm set and call bell in reach.     PM session:   Patient seated in w/c, alert and ready for afternoon session.   He demonstrates fair recall of earlier sessions.  Completed seated forward reach to targets utilizing BITS system.  He is able to see and locate large images (unable to read 1/2" or 1 " words at 4-6 feet)    Reaction time with size 7 dots for 2 minutes, seated = 1.93 sec Saccades:  size 7, A-O = 1:37, rxn 6.47sec  Patient requires moderate prep with w/c push ups and bilateral knee stretches in order to stand effectively.  After multiple attempts  he was able to stand with min A and maintain stance for 2 minutes with reaching activities on BITS.  He is fearful of weight shift to right but improved with repetition.   Completed seated trunk and shoulder stretch with good results.  He remained seated in w/c at close of session, seat belt alarm set and call bell in reach.       Therapy Documentation Precautions:  Precautions Precautions: Fall Precaution Comments: R>L lower extremity weakness, pt is legally blind, has foley catheter Restrictions Weight Bearing Restrictions: No   Therapy/Group: Individual Therapy  Carlos Levering 07/01/2021, 7:35 AM

## 2021-07-02 ENCOUNTER — Inpatient Hospital Stay (HOSPITAL_COMMUNITY): Payer: Medicare Other

## 2021-07-02 DIAGNOSIS — R609 Edema, unspecified: Secondary | ICD-10-CM

## 2021-07-02 NOTE — Progress Notes (Signed)
Speech Language Pathology Daily Session Note  Patient Details  Name: Parker Odonnell. MRN: [id number] Date of Birth: 06/16/1932  Today's Date: 07/02/2021 SLP Individual Time: 1117-1200 SLP Individual Time Calculation (min): 43 min  Short Term Goals: Week 1: SLP Short Term Goal 1 (Week 1): Patient will verbalize orientation to place, time and situation with Mod A multimodal cues. SLP Short Term Goal 2 (Week 1): Patient will demonstrate functional problem solving with Mod A multimodal cues. SLP Short Term Goal 3 (Week 1): Patient will identify 2 cognitive and 2 physical deficits with Mod verbal cues.  Skilled Therapeutic Interventions: Pt seen for skilled ST with focus on cognitive goals. Pt lethargic during session, at times nodding off to sleep while thinking/responding to questions. SLP facilitating orientation task by providing pt with simple timeline re: hospital admission and hospital course (pt was unable to recall why he was hospitalized). With min A verbal cues for use of visual aids, pt oriented to place, time and events of the morning and previous tx sessions. Pt completing simple problem solving task related to current environment with mod fading to min A cues for 75% accuracy. Pt does not appear fully aware of cognitive impairments at this time, will benefit from ongoing education and training. When asked basic questions re: home environment and daily routine, pt repeating "my daughter has the answers to all of that". Pt left in wheelchair with alarm belt present and all needs within reach. Cont ST POC.  Pain Pain Assessment Pain Scale: 0-10 Pain Score: 0-No pain  Therapy/Group: Individual Therapy  Dewaine Conger 07/02/2021, 11:32 AM

## 2021-07-02 NOTE — Progress Notes (Signed)
Occupational Therapy Session Note  Patient Details  Name: Parker Odonnell. MRN: [id number] Date of Birth: [birth year]  Today's Date: 07/02/2021 OT Individual Time: 0700-0801 OT Individual Time Calculation (min): 61 min    Short Term Goals: Week 1:  OT Short Term Goal 1 (Week 1): Patient will perform sit<>stand with max A of 1 and LRAD in preparation for BADL tasks. OT Short Term Goal 2 (Week 1): Patient will perform 1 step of LB dressing task OT Short Term Goal 3 (Week 1): Pt will don shirt with min A  Skilled Therapeutic Interventions/Progress Updates:  Pt received supine in bed, no c/o pain and agreeable to OT session focusing on bathing/dressing tasks. Pt completed bed mobility supine > EOB with min A. Completed squat > pivot transfer to [facility] with mod A and directional cuing for hand placement for visual deficits. Doffed shirt with directional cuing seated in wc. Pt required cues to locate washcloth for UB bathing, performed UB bathing min A with reminders for safe IV management. Completed sit > stand in stedy with min A to prevent posterior bias and tactile cues to direct hand placement. Able to complete posterior peri-care in stedy - needed skilled instruction to alternate hands. Pt return demonstrated technique on opposite side for LB bathing. Required max A for management of catheter and treading legs distally through pants. Mod VC required to replicate alternating hand on stedy for pulling pants over bottom. Shirt donned while seated in wc with setup. Pt had increased anxiety prior to completing sit > stands in stedy and stated "now, wait just a minute" frequently. Overall, pt demonstrated less anxiety throughout tx when offered VC to prepare for sequencing of tasks. Pt left seated in wc, alarm belt on, call bell nearby and breakfast setup on tray table.   Therapy Documentation Precautions:  Precautions Precautions: Fall Precaution Comments: R>L lower extremity weakness, pt is legally  blind, has foley catheter Restrictions Weight Bearing Restrictions: No    Therapy/Group: Individual Therapy  [name] 07/02/2021, 12:47 PM

## 2021-07-02 NOTE — Progress Notes (Signed)
Physical Therapy Weekly Progress Note  Patient Details  Name: Parker Odonnell. MRN: [id number] Date of Birth: 1932-01-17  Beginning of progress report period: June 27, 2021 End of progress report period: July 02, 2021  Today's Date: 07/02/2021 PT Individual Time: 1005-1100 PT Individual Time Calculation (min): 55 min   Patient has met 3 of 3 short term goals.  Patient with slow and steady progress this week, limited by R LE pain and fear of falling. Patient currently performs bed mobility with supervision using hospital bed features and min A without bed features, min A for sit-stand and squat pivot transfers and mod A +2 for stand pivot with RW, ambulating 20 feet with minA +2 using RW.  Patient continues to demonstrate the following deficits muscle weakness and muscle joint tightness, decreased cardiorespiratoy endurance, decreased coordination and decreased motor planning, decreased visual acuity and decreased visual perceptual skills, decreased midline orientation, decreased awareness, decreased problem solving, and decreased memory, and decreased sitting balance, decreased standing balance, decreased postural control, and decreased balance strategies and therefore will continue to benefit from skilled PT intervention to increase functional independence with mobility.  Patient progressing toward long term goals. Continue plan of care.  PT Short Term Goals Week 1:  PT Short Term Goal 1 (Week 1): Patient will perform bed mobility with supervision using hospital bed features. PT Short Term Goal 1 - Progress (Week 1): Met PT Short Term Goal 2 (Week 1): Patient will perform basic transfers with min A consistently. PT Short Term Goal 2 - Progress (Week 1): Met PT Short Term Goal 3 (Week 1): Patient will ambulate >10 feet with +2 assist. PT Short Term Goal 3 - Progress (Week 1): Met Week 2:  PT Short Term Goal 1 (Week 2): Patient will perform stand pivot transfers with mod A from 1  person using LRAD. PT Short Term Goal 2 (Week 2): Patient will ambulate >20 feet using LRAD with mod A of 1 person. PT Short Term Goal 3 (Week 2): Patient will tolerate standing balance with min A >3 min.  Skilled Therapeutic Interventions/Progress Updates:     Patient in bed asleep upon PT arrival. Patient slow to arouse and agreeable to PT session. Patient reported 6/10 R knee pain during session, RN made aware and provided pain medicine during session. PT provided repositioning, rest breaks, and distraction as pain interventions throughout session. Discussed pre-medicating patient prior to therapy sessions for improved activity tolerance, RN instructed therapy to inform nursing staff about pain medicine 30 min prior to therapy sessions.  Therapeutic Activity: Bed Mobility: Patient performed supine to/from sit with min A due to increased R knee pain with mobility. Provided verbal cues for log roll technique and controlled movement for pain management and energy conservation.  Gait Training:  Patient ambulated 12 feet and 20 feet using RW with +2 facilitating forward motion of RW and posture and PT facilitating R foot placement, R knee extension in stance, and B weight shift to promote reciprocal gait pattern. Provided mod multimodal cues for sequencing and R foot/heel placement for increased step length and R knee extension in stance. Discussed having family bring shoes for patient, will need to follow-up as patient may not recall this.CSW made aware.  Wheelchair Mobility:  Patient was transported in the w/c with total A throughout session for energy conservation and time management.  Neuromuscular Re-ed: Patient performed the following functional activities for improved trunk control and R lower extremity motor control for improved balance and sequencing with  functional mobility: -Patient performed sit to/from stand x3 with min A +1 and +2 to stabilize RW for safety. He performed stand pivot  bed>w/c with min-mod A +2 using RW for balance and AD management for safety due to decreased motor planning with pivot and stepping. Provided max multimodal cues for foot placement, scooting forward, anterior pelvic tilt and forward weight shift to stand, hand placement, sequencing and weight shift during pivot, and controlled descent. -sitting balance focused on anterior pelvic tilt with multimodal cues and facilitation to reduce retropulsion with dynamic balance >2 min sitting EOB  Therapeutic Exercise: Patient performed the following exercises with verbal and tactile cues for proper technique. -B hamstring and gastroc stretch 2x1 min seated EOB  Patient in w/c in the room at end of session with breaks locked, seat belt alarm set, and all needs within reach.   Therapy Documentation Precautions:  Precautions Precautions: Fall Precaution Comments: R>L lower extremity weakness, pt is legally blind, has foley catheter Restrictions Weight Bearing Restrictions: No   Therapy/Group: Individual Therapy  Maimouna Rondeau L Amali Uhls PT, DPT  07/02/2021, 4:37 PM

## 2021-07-02 NOTE — Progress Notes (Signed)
Occupational Therapy Session Note  Patient Details  Name: Parker E Garcia Jr. MRN: [id number] Date of Birth: 05/30/1932  Today's Date: 07/02/2021 OT Individual Time: 1305-1345 OT Individual Time Calculation (min): 40 min    Short Term Goals: Week 1:  OT Short Term Goal 1 (Week 1): Patient will perform sit<>stand with max A of 1 and LRAD in preparation for BADL tasks. OT Short Term Goal 2 (Week 1): Patient will perform 1 step of LB dressing task OT Short Term Goal 3 (Week 1): Pt will don shirt with min A  Skilled Therapeutic Interventions/Progress Updates:     Pt received in w/c with 3/10 pain with repositioning provided for pain relief at end of session and rest throughout.  Therapeutic activity Total A transport to tx gym for time management. Attempted to engage pt at tabletop sorting puzzle mathing colored circles to picture however visual deficits impacting performance and requires termination of task. Pt completes 4 sit to stands at high low table with attempt to use BUE to push up from hemi height w/c, however unable to achieve forward weight shift to power up, therefore transitioned to 1 hand on table and completes sit to stand with MOD-MAX overall for power up and R knee block. Pt folds 4 towels at table top over 3 standing trials with up to MOD A for balance and VC for R knee extension. Pt unable to unweight BUE d/t fear of falling and R knee weakness. Worked stand>sit transition with increased facilitation of hand placement on arm rest to control decent to w/c.   Pt left at end of session in w/c with exit alarm on, call light in reach and all needs met   Therapy Documentation Precautions:  Precautions Precautions: Fall Precaution Comments: R>L lower extremity weakness, pt is legally blind, has foley catheter Restrictions Weight Bearing Restrictions: No General:   Vital Signs:   Therapy/Group: Individual Therapy  Stephanie M Schlosser 07/02/2021, 6:50 AM 

## 2021-07-02 NOTE — Progress Notes (Signed)
Lower extremity venous has been completed.   Preliminary results in CV Proc.   Parker Odonnell 07/02/2021 9:37 AM

## 2021-07-02 NOTE — Progress Notes (Signed)
Nutrition Follow-up  DOCUMENTATION CODES:   Non-severe (moderate) malnutrition in context of chronic illness  INTERVENTION:  Continue Ensure Enlive po TID, each supplement provides 350 kcal and 20 grams of protein   Encourage adequate PO intake.  NUTRITION DIAGNOSIS:   Moderate Malnutrition related to chronic illness (CHF) as evidenced by moderate fat depletion, moderate muscle depletion  GOAL:   Patient will meet greater than or equal to 90% of their needs; met  MONITOR:   PO intake, Supplement acceptance, Labs, Weight trends, Skin, I & O's  REASON FOR ASSESSMENT:   Malnutrition Screening Tool    ASSESSMENT:   87 year old with history of CAD, legally blind, diastolic congestive heart failure, hyperlipidemia, as well as left hip replacement. He presented on 06/10/2021 with right leg pain since 05/30/21 as well as recent fall. bilateral lower extremity weakness right more than left/purpuric right lower extremity rash. Given lymphocytic pleocytosis and elevated protein on CSF, VZV PCR now positive. Patient decreased functional as well as cognitive ability was admitted for a comprehensive rehab program.  Meal completion has been 50-100%. Pt reports having a good appetite with no difficulties. Pt has been tolerating his PO diet. Pt currently has Ensure ordered and has been consuming them. RD to continue with current orders to aid in caloric and protein needs.  NUTRITION - FOCUSED PHYSICAL EXAM:  Flowsheet Row Most Recent Value  Orbital Region Unable to assess  Upper Arm Region Moderate depletion  Thoracic and Lumbar Region Moderate depletion  Buccal Region Unable to assess  Temple Region Unable to assess  Clavicle Bone Region Severe depletion  Clavicle and Acromion Bone Region Severe depletion  Scapular Bone Region Unable to assess  Dorsal Hand Unable to assess  Patellar Region Mild depletion  Anterior Thigh Region Moderate depletion  Posterior Calf Region Moderate depletion   Edema (RD Assessment) Mild  Hair Reviewed  Eyes Reviewed  Mouth Reviewed  Skin Reviewed  Nails Reviewed      Labs and medications reviewed.   Diet Order:   Diet Order             Diet regular Room service appropriate? No; Fluid consistency: Thin  Diet effective now                   EDUCATION NEEDS:   Not appropriate for education at this time  Skin:  Skin Assessment: Skin Integrity Issues: Skin Integrity Issues:: Stage II Stage II: buttocks  Last BM:  9/12  Height:   Ht Readings from Last 1 Encounters:  06/25/21 $RemoveB'5\' 8"'MsLwvWaP$  (1.727 m)    Weight:   Wt Readings from Last 1 Encounters:  07/02/21 63 kg   BMI:  Body mass index is 21.12 kg/m.  Estimated Nutritional Needs:   Kcal:  1750-1900  Protein:  80-90 grams  Fluid:  >/= 1.7 L/day  Corrin Parker, MS, RD, LDN RD pager number/after hours weekend pager number on Amion.

## 2021-07-03 DIAGNOSIS — E440 Moderate protein-calorie malnutrition: Secondary | ICD-10-CM | POA: Insufficient documentation

## 2021-07-03 NOTE — Plan of Care (Signed)
  Problem: RH Problem Solving Goal: LTG Patient will demonstrate problem solving for (SLP) Description: LTG:  Patient will demonstrate problem solving for basic/complex daily situations with cues  (SLP) Flowsheets (Taken 07/03/2021 0622) LTG: Patient will demonstrate problem solving for (SLP): Basic daily situations LTG Patient will demonstrate problem solving for: Moderate Assistance - Patient 50 - 74% Note: Goal added to focus on functional problem solving and safety

## 2021-07-03 NOTE — Progress Notes (Signed)
Speech Language Pathology Weekly Progress and Session Note  Patient Details  Name: Parker Odonnell. MRN: [id number] Date of Birth: Jan 15, 1932  Beginning of progress report period: June 26, 2021 End of progress report period: July 03, 2021  Today's Date: 07/03/2021 SLP Individual Time: 0867-6195 SLP Individual Time Calculation (min): 40 min  Short Term Goals: Week 1: SLP Short Term Goal 1 (Week 1): Patient will verbalize orientation to place, time and situation with Mod A multimodal cues. SLP Short Term Goal 1 - Progress (Week 1): Met SLP Short Term Goal 2 (Week 1): Patient will demonstrate functional problem solving with Mod A multimodal cues. SLP Short Term Goal 2 - Progress (Week 1): Met SLP Short Term Goal 3 (Week 1): Patient will identify 2 cognitive and 2 physical deficits with Mod verbal cues. SLP Short Term Goal 3 - Progress (Week 1): Not met    New Short Term Goals: Week 2: SLP Short Term Goal 1 (Week 2): Patient will verbalize orientation to place, time and situation with Min A multimodal cues. SLP Short Term Goal 2 (Week 2): Patient will demonstrate functional problem solving with Min A multimodal cues. SLP Short Term Goal 3 (Week 2): Patient will identify 2 cognitive and 2 physical deficits with Mod verbal cues.  Weekly Progress Updates: Patient has made slow gains and has met 2 of 3 STGs this reporting period. Currently, patient continues to require overall Mod A multimodal cues for use of visual aids for orientation and functional problem solving. Patient demonstrates improved intellectual awareness of physical deficits but requires Max verbal cues for awareness of cognitive deficits. Patient also demonstrates less language of confusion with increased vague recall of daily events. Patient and family education ongoing. Patient would benefit from continued skilled SLP intervention to maximize his cognitive functioning and overall functional independence prior to  discharge.     Intensity: Minumum of 1-2 x/day, 30 to 90 minutes Frequency: 3 to 5 out of 7 days Duration/Length of Stay: 07/18/21 Treatment/Interventions: Cognitive remediation/compensation;Internal/external aids;Therapeutic Activities;Environmental controls;Cueing hierarchy;Functional tasks;Patient/family education   Daily Session  Skilled Therapeutic Interventions:   Skilled treatment session focused on cognitive goals. Upon arrival, patient requested to use the [facility]. Patient scooted to the edge of bed with extra time. Patient was transferred to the [facility] via the [facility] with +2 assist. Patient was continent of bowel. Patient was independently oriented to place and required supervision level verbal and visual cues for use of visual aids for orientation to situation, date and time. Patient able to name 2 physical deficits since admission with Min verbal cues but requires Mod-Max verbal cues to recall cognitive deficits. Overall, patient continues to make steady progress. Patient left upright in the wheelchair with alarm on and all needs within reach. Continue with current plan of care.     Pain No/Denies Pain   Therapy/Group: Individual Therapy  Everette Dimauro 07/03/2021, 6:26 AM

## 2021-07-03 NOTE — Progress Notes (Signed)
Physical Therapy Session Note  Patient Details  Name: Parker Odonnell. MRN: [id number] Date of Birth: 26-Jun-1932  Today's Date: 07/03/2021 PT Individual Time: 0930-1015 PT Individual Time Calculation (min): 45 min   Short Term Goals: Week 2:  PT Short Term Goal 1 (Week 2): Patient will perform stand pivot transfers with mod A from 1 person using LRAD. PT Short Term Goal 2 (Week 2): Patient will ambulate >20 feet using LRAD with mod A of 1 person. PT Short Term Goal 3 (Week 2): Patient will tolerate standing balance with min A >3 min.  Skilled Therapeutic Interventions/Progress Updates:     Pt received seated in [facility] and agrees to therapy. During session, pt noted to have pain in R lower extremity. Number not provided. PT provides rest breaks and repositioning to manage pain. WC transport to gym for time management. Pt stands in parallel bars with minA and cues for body mechanics and hand placement. Pt ambulates x5' forward with modA and PT blocking R knee due to pt keeping knee flexed throughout gait cycle and buckling further during stance phase. Pt attempts to ambulate backward to WC and R knee completely buckles, requiring maxA to maintain balance. Pt able to regain upright standing with verbal and tactile cues for leg extension and upright posture. Pt guided back to [facility] and requires extended seated rest break. Pt then performs standing marching in place, with PT blocking R knee and facilitating lateral weight shift. Pt completes x3 bouts of x10 with each leg and requires extended seated rest breaks between each bout. PT cues for body mechanics when transitioning from stand to sit as pt tends to sit straight down without shifting COG posteriorly, causing him to sit at very edge of WC.   WC transport back to room. Pt left seated in WC with alarm intact and all needs within reach.  Therapy Documentation Precautions:  Precautions Precautions: Fall Precaution Comments: R>L lower extremity  weakness, pt is legally blind, has foley catheter Restrictions Weight Bearing Restrictions: No   Therapy/Group: Individual Therapy  Breck Coons, PT, DPT 07/03/2021, 4:43 PM

## 2021-07-03 NOTE — Progress Notes (Signed)
Patient ID: Parker Gibson Sink., male   DOB: Sep 23, 1932, 87 y.o.   MRN: [id number]  SW received message from therapy team indicating pt needs clothes and tennis shoes while in therapy. SW to call pt dtr.  SW spoke with [name] [phone]) to discuss above. Reports pt should have clothes and tennis shoes here atleast by tomorrow. SW updated medical team.   Loralee Pacas, MSW, [place] Office: [phone] Cell: [phone] Fax: [phone]

## 2021-07-03 NOTE — Plan of Care (Signed)
  Problem: Consults Goal: RH GENERAL PATIENT EDUCATION Description: See Patient Education module for education specifics. Outcome: Progressing   Problem: RH BOWEL ELIMINATION Goal: RH STG MANAGE BOWEL WITH ASSISTANCE Description: STG Manage Bowel with Min Assistance. Outcome: Progressing   Problem: RH SKIN INTEGRITY Goal: RH STG MAINTAIN SKIN INTEGRITY WITH ASSISTANCE Description: STG Maintain Skin Integrity With Java. Outcome: Progressing   Problem: RH SAFETY Goal: RH STG DECREASED RISK OF FALL WITH ASSISTANCE Description: STG Decreased Risk of Fall With Cues and Reminders. Outcome: Progressing   Problem: RH PAIN MANAGEMENT Goal: RH STG PAIN MANAGED AT OR BELOW PT'S PAIN GOAL Description: < 3 on a 0-10 pain scale. Outcome: Progressing

## 2021-07-03 NOTE — Progress Notes (Signed)
PROGRESS NOTE   Subjective/Complaints: Right knee sore, leg sore too, better than before  ROS: Patient denies fever, rash, sore throat, blurred vision, nausea, vomiting, diarrhea, cough, shortness of breath or chest pain,   headache, or mood change. . .   Objective:   VAS [place] LOWER EXTREMITY VENOUS (DVT)  Result Date: 07/02/2021  Lower Venous DVT Study Patient Name:  Parker Odonnell.  Date of Exam:   07/02/2021 Medical Rec #: [id number]          Accession #:    [id number] Date of Birth: Nov 21, 1931         Patient Gender: M Patient Age:   87 years Exam Location:  [facility] Procedure:      VAS [place] LOWER EXTREMITY VENOUS (DVT) Referring Phys: Thelma Comp PATEL --------------------------------------------------------------------------------  Indications: Edema.  Performing Technologist: Archie Patten RVS  Examination Guidelines: A complete evaluation includes B-mode imaging, spectral Doppler, color Doppler, and power Doppler as needed of all accessible portions of each vessel. Bilateral testing is considered an integral part of a complete examination. Limited examinations for reoccurring indications may be performed as noted. The reflux portion of the exam is performed with the patient in reverse Trendelenburg.  +---------+---------------+---------+-----------+----------+--------------+ RIGHT    CompressibilityPhasicitySpontaneityPropertiesThrombus Aging +---------+---------------+---------+-----------+----------+--------------+ CFV      Full           Yes      Yes                                 +---------+---------------+---------+-----------+----------+--------------+ SFJ      Full                                                        +---------+---------------+---------+-----------+----------+--------------+ FV Prox  Full                                                         +---------+---------------+---------+-----------+----------+--------------+ FV Mid   Full                                                        +---------+---------------+---------+-----------+----------+--------------+ FV DistalFull                                                        +---------+---------------+---------+-----------+----------+--------------+ PFV      Full                                                        +---------+---------------+---------+-----------+----------+--------------+  POP      Full           Yes      Yes                                 +---------+---------------+---------+-----------+----------+--------------+ PTV      Full                                                        +---------+---------------+---------+-----------+----------+--------------+ PERO     Full                                                        +---------+---------------+---------+-----------+----------+--------------+   +---------+---------------+---------+-----------+----------+-------------------+ LEFT     CompressibilityPhasicitySpontaneityPropertiesThrombus Aging      +---------+---------------+---------+-----------+----------+-------------------+ CFV      Full           Yes      Yes                                      +---------+---------------+---------+-----------+----------+-------------------+ SFJ      Full                                                             +---------+---------------+---------+-----------+----------+-------------------+ FV Prox  Full                                                             +---------+---------------+---------+-----------+----------+-------------------+ FV Mid   Full                                                             +---------+---------------+---------+-----------+----------+-------------------+ FV DistalFull                                                              +---------+---------------+---------+-----------+----------+-------------------+ PFV      Full                                                             +---------+---------------+---------+-----------+----------+-------------------+ POP      Full  Yes      Yes                                      +---------+---------------+---------+-----------+----------+-------------------+ PTV      Full                                                             +---------+---------------+---------+-----------+----------+-------------------+ PERO                                                  Not well visualized +---------+---------------+---------+-----------+----------+-------------------+     Summary: BILATERAL: - No evidence of deep vein thrombosis seen in the lower extremities, bilaterally. -No evidence of popliteal cyst, bilaterally.   *See table(s) above for measurements and observations. Electronically signed by Monica Martinez MD on 07/02/2021 at 2:56:34 PM.    Final    No results for input(s): WBC, HGB, HCT, PLT in the last 72 hours.  Recent Labs    07/01/21 0510  NA 136  K 4.1  CL 104  CO2 24  GLUCOSE 95  BUN 10  CREATININE 0.84  CALCIUM 8.8*    Intake/Output Summary (Last 24 hours) at 07/03/2021 1037 Last data filed at 07/03/2021 [id number] Gross per 24 hour  Intake 1140 ml  Output 650 ml  Net 490 ml         Physical Exam: Vital Signs Blood pressure 116/66, pulse 61, temperature 98 F (36.7 C), temperature source Oral, resp. rate 18, height '5\' 8"'$  (1.727 m), weight 63 kg, SpO2 96 %.  Constitutional: No distress . Vital signs reviewed. HEENT: NCAT, EOMI, oral membranes moist Neck: supple Cardiovascular: RRR without murmur. No JVD    Respiratory/Chest: CTA Bilaterally without wheezes or rales. Normal effort    GI/Abdomen: BS +, non-tender, non-distended Ext: no clubbing, cyanosis, or edema Psych: pleasant and cooperative  Skin:  continued numerous abrasions R>L LE's, small buttock pressure wound stage 2 Neuro:  Alert and oriented x 3. Fair insight. STM deficits persist. Follows one step commands without any issues.. Normal language and speech. Cranial nerve exam unremarkable. UE 5/5. LLE 3-/5 prox to 4/5 distally. RLE 2- to3-/5 prox to distal limited by pain. Decreased LT/PP R>L LE's--no motor or sensory changes Musculoskeletal: right leg remains sensitive   Assessment/Plan: 1. Functional deficits which require 3+ hours per day of interdisciplinary therapy in a comprehensive inpatient rehab setting. Physiatrist is providing close team supervision and 24 hour management of active medical problems listed below. Physiatrist and rehab team continue to assess barriers to discharge/monitor patient progress toward functional and medical goals  Care Tool:  Bathing    Body parts bathed by patient: Right arm, Chest, Left arm, Abdomen, Front perineal area, Face, Right upper leg, Left upper leg   Body parts bathed by helper: Right lower leg, Left lower leg, Buttocks     Bathing assist Assist Level: Moderate Assistance - Patient 50 - 74%     Upper Body Dressing/Undressing Upper body dressing   What is the patient wearing?: Pull over shirt    Upper body assist Assist Level: Supervision/Verbal  cueing    Lower Body Dressing/Undressing Lower body dressing      What is the patient wearing?: Pants, Incontinence brief     Lower body assist Assist for lower body dressing: Moderate Assistance - Patient 50 - 74%     Toileting Toileting    Toileting assist Assist for toileting: Maximal Assistance - Patient 25 - 49%     Transfers Chair/bed transfer  Transfers assist     Chair/bed transfer assist level: Dependent - mechanical lift     Locomotion Ambulation   Ambulation assist      Assist level: 2 helpers Assistive device: Walker-rolling Max distance: 2 ft   Walk 10 feet activity   Assist  Walk 10  feet activity did not occur: Safety/medical concerns        Walk 50 feet activity   Assist Walk 50 feet with 2 turns activity did not occur: Safety/medical concerns         Walk 150 feet activity   Assist Walk 150 feet activity did not occur: Safety/medical concerns         Walk 10 feet on uneven surface  activity   Assist Walk 10 feet on uneven surfaces activity did not occur: Safety/medical concerns         Wheelchair     Assist Is the patient using a wheelchair?: Yes   Wheelchair activity did not occur: Safety/medical concerns         Wheelchair 50 feet with 2 turns activity    Assist    Wheelchair 50 feet with 2 turns activity did not occur: Safety/medical concerns       Wheelchair 150 feet activity     Assist  Wheelchair 150 feet activity did not occur: Safety/medical concerns       Blood pressure 116/66, pulse 61, temperature 98 F (36.7 C), temperature source Oral, resp. rate 18, height '5\' 8"'$  (1.727 m), weight 63 kg, SpO2 96 %.  Medical Problem List and Plan: 1.  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  IV acyclovir completed today 06/25/2021             -patient may shower             -ELOS/Goals: 10-14 days/supervision          -Continue CIR therapies including PT, OT, and SLP  2.  Antithrombotics: -DVT/anticoagulation: [employer]: Other (comment)             -antiplatelet therapy: N/A 3. Neuropathic pain R>L LE: -Lyrica  -increased to '75mg'$  bid--  -continue tramadol as needed -lidoderm patch             9/15 pain generally improved    4. Mood: Provide emotional support             -antipsychotic agents: N/A 5. Neuropsych: This patient is capable of making decisions on his own behalf. 6. Skin/Wound Care: Routine skin checks 7. Fluids/Electrolytes/Nutrition: Routine in and outs             encourage po     -  8.  Viral leg rash/?vasculitis.  All lesions fully crusted.  Isolation discontinued  -skin bx  --perivascular dermatitis  -rx pain 9.  Hyponatremia.  Felt to be secondary dehydration resolved after IV fluids.                133 9/8, 137 on 9/12, repeat tomorrow 10.  Persistent atrial fibrillation.  Cardiac rate controlled.  Continue Coreg 6.25 mg twice daily/Eliquis  Monitor with increased activity 11.  Diastolic congestive heart failure.  Ejection fraction 35 to 40% echocardiogram 2020.     -stable weights-still not recorded though [facility] Weights   06/25/21 1826 06/25/21 1834 07/02/21 0547  Weight: 69.3 kg 69.3 kg 63 kg    12.  Hypertension. Stop Norvasc. Add clonidine patch right lower extremity for HTN and neuropathic pain 13.  BPH/mild hydronephrosis.  Continue Flomax 0.4 mg daily... Maintain Foley tube until follow-up outpatient urology.  Patient failed voiding trial 06/21/2021 14.  Hyperlipidemia: Lipitor 15.  L1 superior endplate compression fracture/mild edema with spinous process T4-T5, sacral insufficiency fracture.  Supportive care 16.  5 mm right lower lobe pulmonary nodule versus intrapulmonary lymph node.  Repeat noncontrast CT chest 12 months 17. Hyperkalemia: kayexalate lokelma give so far -recheck friday   LOS: 8 days A FACE TO FACE EVALUATION WAS PERFORMED  Meredith Staggers 07/03/2021, 10:37 AM

## 2021-07-03 NOTE — Progress Notes (Signed)
Occupational Therapy Weekly Progress Note  Patient Details  Name: Parker Odonnell. MRN: [id number] Date of Birth: 1932-03-02  Beginning of progress report period: June 26, 2021 End of progress report period: July 03, 2021  Today's Date: 07/03/2021 OT Individual Time: 2542-7062 OT Individual Time Calculation (min): 44 min       Today's Date: 07/03/2021 OT Individual Time: 3762-8315 OT Individual Time Calculation (min): 40 min    Patient has met 3 of 3 short term goals.    Moyses has been progressing well in therapeutic interventions. Overall, anxiety and anticipatory responses have decreased with rapport with therapy team. Requires MIN A for UB ADLs and increased cuing/setup for visual deficits. LB ADL performance is limited to LE weakness and ability to reach forward to don pants/socks. Requires MAX A for distal threading of pants/socks. Charlaine Dalton is used to complete transfers with MOD A for trunk control and cuing to prevent anxiety. Does well with items set up to L side due to visual deficits. Increased activity tolerance and positive affect indicates strong potential towards goals.   Patient continues to demonstrate the following deficits: muscle weakness, decreased cardiorespiratoy endurance, impaired timing and sequencing and decreased motor planning, decreased visual acuity, decreased visual perceptual skills, and decreased visual motor skills, decreased midline orientation and decreased attention to right, and decreased awareness, decreased problem solving, and decreased safety awareness and therefore will continue to benefit from skilled OT intervention to enhance overall performance with BADL and Reduce care partner burden.  Patient progressing toward long term goals..  Continue plan of care.  OT Short Term Goals Week 1:  OT Short Term Goal 1 (Week 1): Patient will perform sit<>stand with max A of 1 and LRAD in preparation for BADL tasks. OT Short Term Goal 2 (Week 1):  Patient will perform 1 step of LB dressing task OT Short Term Goal 3 (Week 1): Pt will don shirt with min A   Skilled Therapeutic Interventions/Progress Updates:  Session 1: Pt received seated in wc, no c/o pain and agreeable to OT session focusing on self-care activities. Pt was able to indicate 2 items he needed for bathing (soap, towel). OTS placed items needed on L side due to visual deficits and pt was able to locate them with 1 VC. Completed UB clothing management/UB washing with setup of items to L to facilitate forward weight shift and reach seated. Stedy was used to facilitate sit>stand with MOD A. Pt able to tolerate standing  ~50 sec in stedy for management of Foley with cuing for trunk control to decrease posterior bias. Pt able to wash peri-area with min A. Pt was able to return demonstrate carryover of alternating hand technique on stedy for peri-care. Completed donning of pants with MAX A for distal threading/foley management. Overall, pt demonstrated a significant improvement in anxiety levels with increased rapport with therapy team. Does well with instructional cuing provided before tasks to promote awareness and decrease pt anticipation. Pt left seated in wc in tilted position, alarm belt on, call  bell nearby and needs met.   Session 2: Pt received seated in wc, wrapped in blanket, with daughter present. No c/o pain and ready for therapy. Family education provided regarding best options for clothing based on current level of function, goals of OT and POC. Daughter to bring clothing, shoes and electric razor soon. Session focused on LB dressing, skin desensitization and skilled education on AE for donning socks. Pt able to remove L sock with min A for distal  support. Some facial grimacing present throughout treatment, pt was instructed to breathe and take breaks as needed throughout. Able to remove R sock requiring mod A due to tightness in R quad and increased pain. Pt was able to apply  lotion to L lower leg and only needed assist for distal portion of foot. Provided skin desensitization with application of lotion to both feet using firm, gentle pressure. Pt tolerated sensation with no c/o pain or discomfort. Compensatory strategy provided for positioning of R foot due to muscle weakness, and pt able to reach further down with foot propped up. Sock aid introduced to pt, and education provided on use. Able to verbalize steps from previous use but for efficiency, sock donned on sock aid. Backwards chaining to don sock on on R foot with MOD A, requiring additional time for command processing and motor planning. Same backwards chaining used with foot and no physical A needed to pull sock onto foot. Attempted to try shoes from home, but shoes were too small due to slight edema. Daughter advised to purchase larger pair. Charlaine Dalton was used for time management to transfer pt back to bed - improvement noted in overall postural control in stedy. Pt left in bed, alarm on, call bell nearby, and daughter in room.   Therapy Documentation Precautions:  Precautions Precautions: Fall Precaution Comments: R>L lower extremity weakness, pt is legally blind, has foley catheter Restrictions Weight Bearing Restrictions: No    Therapy/Group: Individual Therapy  Jenn Worischeck 07/03/2021, 6:56 AM

## 2021-07-04 LAB — BASIC METABOLIC PANEL
Anion gap: 10 (ref 5–15)
BUN: 15 mg/dL (ref 8–23)
CO2: 27 mmol/L (ref 22–32)
Calcium: 9.8 mg/dL (ref 8.9–10.3)
Chloride: 103 mmol/L (ref 98–111)
Creatinine, Ser: 0.93 mg/dL (ref 0.61–1.24)
GFR, Estimated: >60 mL/min (ref >60)
Glucose, Bld: 104 mg/dL — ABNORMAL HIGH (ref 70–99)
Potassium: 4.8 mmol/L (ref 3.5–5.1)
Sodium: 140 mmol/L (ref 135–145)

## 2021-07-04 NOTE — Progress Notes (Signed)
Patient ID: [employer] Sink., male   DOB: [birth year], 87 y.o.   MRN: [id number]  SW spoke with Violeta Gelinas with [employer] [phone])  and SW informed pt will continue to require private aid services. She will follow-up with SW to inform on services pt currently has in place.   Loralee Pacas, MSW, [place] Office: [phone] Cell: [phone] Fax: [phone]

## 2021-07-04 NOTE — Progress Notes (Signed)
Physical Therapy Session Note  Patient Details  Name: Parker Odonnell. MRN: [id number] Date of Birth: 1931/12/22  Today's Date: 07/04/2021 PT Individual Time: 1300-1400 PT Individual Time Calculation (min): 60 min   Short Term Goals: Week 1:  PT Short Term Goal 1 (Week 1): Patient will perform bed mobility with supervision using hospital bed features. PT Short Term Goal 1 - Progress (Week 1): Met PT Short Term Goal 2 (Week 1): Patient will perform basic transfers with min A consistently. PT Short Term Goal 2 - Progress (Week 1): Met PT Short Term Goal 3 (Week 1): Patient will ambulate >10 feet with +2 assist. PT Short Term Goal 3 - Progress (Week 1): Met Week 2:  PT Short Term Goal 1 (Week 2): Patient will perform stand pivot transfers with mod A from 1 person using LRAD. PT Short Term Goal 2 (Week 2): Patient will ambulate >20 feet using LRAD with mod A of 1 person. PT Short Term Goal 3 (Week 2): Patient will tolerate standing balance with min A >3 min. Week 3:     Skilled Therapeutic Interventions/Progress Updates:   Pain:  Pt reports 0/10 at rest, 5/10 w/gait/standing pain.  Treatment to tolerance.  Rest breaks and repositioning as needed.  Pt initially oob in wc and agreeable to treatment session w/focus on functional mobility, standing tolerance, gait, strengthening. Pt unable to lift RLE from legrest of wc w/repositioning.  pt transported to gym for continued session.   Sit to stand from wc w/mod assist in parallel bars, therapist blocking R knee w/poor stability at joint w/transition. Static stand - worked on [employer], quad sets to promote RLE stability/mm activation in standing. Gait 38f, 929fw/close wc follow, therapist blocking R knee w/varying ability of pt to activate quads noted, cues for glut/quad activation, overall mod assist and close wc follow.  Wc propulsion/retropulsion 4514f 2 rounds for quad/HS activation, pt able to perform task, cues to attend to R do  improve function w/task.  Dual LE activity - In sitting, pt tasked w/ squeezing then w/tapping 4in ball alternating between feet.  Initially unable to perfom w/R, attempts to lift thigh w/hands.  Repeated efforts w/tactile cues result in ability to repeat task on R w/min assist for lifting thigh, progressive ability to activate both hip flexor and hamstrings on R.   Pt transported to room.  Pt left oob in wc w/alarm belt set and needs in reach   Therapy Documentation Precautions:  Precautions Precautions: Fall Precaution Comments: R>L lower extremity weakness, pt is legally blind, has foley catheter Restrictions Weight Bearing Restrictions: No     Therapy/Group: Individual Therapy BarCallie FieldingT [place]16/2022, 4:24 PM

## 2021-07-04 NOTE — Progress Notes (Signed)
Occupational Therapy Session Note  Patient Details  Name: Parker Odonnell. MRN: [id number] Date of Birth: [birth year]  Today's Date: 07/04/2021 OT Individual Time: 6195-0932 OT Individual Time Calculation (min): 75 min    Short Term Goals: Week 1:  OT Short Term Goal 1 (Week 1): Patient will perform sit<>stand with max A of 1 and LRAD in preparation for BADL tasks. OT Short Term Goal 1 - Progress (Week 1): Met OT Short Term Goal 2 (Week 1): Patient will perform 1 step of LB dressing task OT Short Term Goal 2 - Progress (Week 1): Met OT Short Term Goal 3 (Week 1): Pt will don shirt with min A OT Short Term Goal 3 - Progress (Week 1): Met Week 2:  OT Short Term Goal 1 (Week 2): Pt will complete sit > stand with LRAD and MOD A in prep for LB dressing OT Short Term Goal 2 (Week 2): Pt will complete transfer to elevated toilet with MOD A OT Short Term Goal 3 (Week 2): Pt will thread BLE through pants with A only for catheter mgmt OT Short Term Goal 4 (Week 2): Pt will complete donning of socks/shoes with MOD A  Skilled Therapeutic Interventions/Progress Updates:    Pt received supine in bed, no c/o pain and requesting to shower. Daughter brought appropriate clothing - did not have time in session to try new shoes. MOD A bed > wc transfer with cuing for L reach of hand. Pt required frequent rest breaks due to fatigue throughout session, and was more anxious at times seated in shower. MOD A x 2 for wc>TTB with pt demonstrating ability to laterally scoot on TTB. Perseverative on washing hair multiple times in shower, and very agitated at times with the shampoo. Difficulty reaching feet but overall MIN A for bathing body with VC provided for peri-area. Directional cuing offered to wash buttocks. Additional time needed for processing of cues with rest breaks. Sit > stand with MOD A for drying LB. Min A cuing needed for drying legs/peri-area. Max A for donning socks for time management. Dons shirt with  setup, and requires MAX A for LB management of clothing due to weakness and catheter. TTB>wc MOD A x 2. Pt left seated in wc, with call bell nearby, alarm belt on and needs met.   Therapy Documentation Precautions:  Precautions Precautions: Fall Precaution Comments: R>L lower extremity weakness, pt is legally blind, has foley catheter Restrictions Weight Bearing Restrictions: No     Therapy/Group: Individual Therapy  Parker Odonnell 07/04/2021, 6:49 AM

## 2021-07-04 NOTE — Progress Notes (Signed)
PROGRESS NOTE   Subjective/Complaints: Pt in good spirits. Resting better. Right leg less tender. Feels that he's making progress with therapy  ROS: Patient denies fever, rash, sore throat, blurred vision, nausea, vomiting, diarrhea, cough, shortness of breath or chest pain, headache, or mood change.    Objective:   No results found. No results for input(s): WBC, HGB, HCT, PLT in the last 72 hours.  Recent Labs    07/04/21 0502  NA 140  K 4.8  CL 103  CO2 27  GLUCOSE 104*  BUN 15  CREATININE 0.93  CALCIUM 9.8    Intake/Output Summary (Last 24 hours) at 07/04/2021 1107 Last data filed at 07/04/2021 0048 Gross per 24 hour  Intake 280 ml  Output 1450 ml  Net -1170 ml         Physical Exam: Vital Signs Blood pressure 130/80, pulse 69, temperature 98 F (36.7 C), temperature source Oral, resp. rate 16, height '5\' 8"'$  (1.727 m), weight 63.9 kg, SpO2 97 %.  Constitutional: No distress . Vital signs reviewed. HEENT: NCAT, EOMI, oral membranes moist Neck: supple Cardiovascular: RRR without murmur. No JVD    Respiratory/Chest: CTA Bilaterally without wheezes or rales. Normal effort    GI/Abdomen: BS +, non-tender, non-distended Ext: no clubbing, cyanosis, or edema Psych: pleasant and cooperative   Skin: slowly resolving abrasions R>L LE's Neuro:  Alert and oriented x 3. Fair insight. STM deficits persist but seems more clear. Vision somewhat limited. Follows one step commands without any issues.. Normal language and speech.   UE 5/5. LLE 3-/5 prox to 4/5 distally. RLE 2 to 3/5 prox to distal limited by pain. Decreased LT/PP R>L LE's Musculoskeletal: right leg much less sensitive to touch.   Assessment/Plan: 1. Functional deficits which require 3+ hours per day of interdisciplinary therapy in a comprehensive inpatient rehab setting. Physiatrist is providing close team supervision and 24 hour management of active  medical problems listed below. Physiatrist and rehab team continue to assess barriers to discharge/monitor patient progress toward functional and medical goals  Care Tool:  Bathing    Body parts bathed by patient: Right arm, Chest, Left arm, Abdomen, Front perineal area, Face, Right upper leg, Left upper leg   Body parts bathed by helper: Right lower leg, Left lower leg, Buttocks     Bathing assist Assist Level: Moderate Assistance - Patient 50 - 74%     Upper Body Dressing/Undressing Upper body dressing   What is the patient wearing?: Pull over shirt    Upper body assist Assist Level: Supervision/Verbal cueing    Lower Body Dressing/Undressing Lower body dressing      What is the patient wearing?: Pants, Incontinence brief     Lower body assist Assist for lower body dressing: Moderate Assistance - Patient 50 - 74%     Toileting Toileting    Toileting assist Assist for toileting: Maximal Assistance - Patient 25 - 49%     Transfers Chair/bed transfer  Transfers assist     Chair/bed transfer assist level: Dependent - mechanical lift     Locomotion Ambulation   Ambulation assist      Assist level: 2 helpers Assistive device: Walker-rolling Max distance:  2 ft   Walk 10 feet activity   Assist  Walk 10 feet activity did not occur: Safety/medical concerns        Walk 50 feet activity   Assist Walk 50 feet with 2 turns activity did not occur: Safety/medical concerns         Walk 150 feet activity   Assist Walk 150 feet activity did not occur: Safety/medical concerns         Walk 10 feet on uneven surface  activity   Assist Walk 10 feet on uneven surfaces activity did not occur: Safety/medical concerns         Wheelchair     Assist Is the patient using a wheelchair?: Yes   Wheelchair activity did not occur: Safety/medical concerns         Wheelchair 50 feet with 2 turns activity    Assist    Wheelchair 50 feet  with 2 turns activity did not occur: Safety/medical concerns       Wheelchair 150 feet activity     Assist  Wheelchair 150 feet activity did not occur: Safety/medical concerns       Blood pressure 130/80, pulse 69, temperature 98 F (36.7 C), temperature source Oral, resp. rate 16, height '5\' 8"'$  (1.727 m), weight 63.9 kg, SpO2 97 %.  Medical Problem List and Plan: 1.  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  IV acyclovir completed today 06/25/2021             -patient may shower             -ELOS/Goals: 10-14 days/supervision            -Continue CIR therapies including PT, OT, and SLP  2.  Antithrombotics: -DVT/anticoagulation: [employer]: Other (comment)             -antiplatelet therapy: N/A 3. Neuropathic pain R>L LE: -Lyrica  -increased to '75mg'$  bid  -continue tramadol as needed -lidoderm patch             9/16 pain control improved    4. Mood: Provide emotional support             -antipsychotic agents: N/A 5. Neuropsych: This patient is capable of making decisions on his own behalf. 6. Skin/Wound Care: Routine skin checks 7. Fluids/Electrolytes/Nutrition: Routine in and outs             encourage po     -  8.  Viral leg rash/?vasculitis.  All lesions fully crusted.  Isolation discontinued  -skin bx --perivascular dermatitis  -rx pain 9.  Hyponatremia.                    9/16 Na+ up to 140 10.  Persistent atrial fibrillation.  Cardiac rate controlled.  Continue Coreg 6.25 mg twice daily/Eliquis             HR controlled 11.  Diastolic congestive heart failure.  Ejection fraction 35 to 40% echocardiogram 2020.     -stable weights Filed Weights   06/25/21 1834 07/02/21 0547 07/04/21 0500  Weight: 69.3 kg 63 kg 63.9 kg    12.  Hypertension. Stopped Norvasc. Added clonidine patch right lower extremity for HTN and neuropathic pain  -bp controlled 13.  BPH/mild hydronephrosis.  Continue Flomax 0.4 mg daily... Maintain Foley tube until follow-up outpatient  urology.  Patient failed voiding trial 06/21/2021 14.  Hyperlipidemia: Lipitor 15.  L1 superior endplate compression fracture/mild edema with spinous process T4-T5, sacral insufficiency fracture.  Supportive care 16.  5 mm right lower lobe pulmonary nodule versus intrapulmonary lymph node.  Repeat noncontrast CT chest 12 months 17. Hyperkalemia: kayexalate lokelma give so far -K+ trending back up 9/16---recheck Monday   LOS: 9 days A FACE TO FACE EVALUATION WAS PERFORMED  Meredith Staggers 07/04/2021, 11:07 AM

## 2021-07-04 NOTE — Progress Notes (Signed)
Speech Language Pathology Daily Session Note  Patient Details  Name: Parker Odonnell. MRN: [id number] Date of Birth: 24-May-1932  Today's Date: 07/04/2021 SLP Individual Time: [id number] SLP Individual Time Calculation (min): 40 min  Short Term Goals: Week 2: SLP Short Term Goal 1 (Week 2): Patient will verbalize orientation to place, time and situation with Min A multimodal cues. SLP Short Term Goal 2 (Week 2): Patient will demonstrate functional problem solving with Min A multimodal cues. SLP Short Term Goal 3 (Week 2): Patient will identify 2 cognitive and 2 physical deficits with Mod verbal cues.  Skilled Therapeutic Interventions: Skilled treatment session focused on cognitive goals. Upon arrival, patient was asleep with his breakfast tray next to him. Patient easily roused and requested to eat his breakfast. With extra time, patient able to locate all items on the tray and perform tray set-up with the exception of cutting his food and opening his juice container. Patient able to alternate attention between self-feeding and basic conversation that focused on orientation with supervision level verbal cues for 30 minutes. Patient required overall supervision level verbal cues for use of external aids for orientation to place, time and situation. Patient left upright in bed with alarm on and all needs within reach. Continue with current plan of care.      Pain No/Denies Pain   Therapy/Group: Individual Therapy  Aretha Levi 07/04/2021, 12:37 PM

## 2021-07-05 NOTE — Progress Notes (Signed)
PROGRESS NOTE   Subjective/Complaints: Patient seen laying in bed this morning.  He states he slept well overnight.  No reported issues overnight.  He asks for cream for his itching.  ROS: Denies CP, SOB, N/V/D  Objective:   No results found. No results for input(s): WBC, HGB, HCT, PLT in the last 72 hours.  Recent Labs    07/04/21 0502  NA 140  K 4.8  CL 103  CO2 27  GLUCOSE 104*  BUN 15  CREATININE 0.93  CALCIUM 9.8     Intake/Output Summary (Last 24 hours) at 07/05/2021 1107 Last data filed at 07/05/2021 0721 Gross per 24 hour  Intake 1040 ml  Output 1500 ml  Net -460 ml          Physical Exam: Vital Signs Blood pressure (!) 108/54, pulse 70, temperature 97.6 F (36.4 C), temperature source Oral, resp. rate 18, height '5\' 8"'$  (1.727 m), weight 64.3 kg, SpO2 93 %. Constitutional: No distress . Vital signs reviewed. HENT: Normocephalic.  Atraumatic. Eyes: EOMI. No discharge. Cardiovascular: No JVD.  RRR. Respiratory: Normal effort.  No stridor.  Bilateral clear to auscultation. GI: Non-distended.  BS +. Skin: Warm and dry.  Healing abrasions. Psych: Normal mood.  Normal behavior. Musc: No edema in extremities.  No tenderness in extremities. Neuro: Alert Motor: Bilateral UE 5/5.  LLE 3-/5 prox to 4/5 distally.  RLE 2+/5 prox to distal (some pain inhibition)  Assessment/Plan: 1. Functional deficits which require 3+ hours per day of interdisciplinary therapy in a comprehensive inpatient rehab setting. Physiatrist is providing close team supervision and 24 hour management of active medical problems listed below. Physiatrist and rehab team continue to assess barriers to discharge/monitor patient progress toward functional and medical goals  Care Tool:  Bathing    Body parts bathed by patient: Left arm, Right arm, Chest, Abdomen, Front perineal area, Buttocks, Left upper leg, Right upper leg, Face    Body parts bathed by helper: Left lower leg, Right lower leg     Bathing assist Assist Level: Minimal Assistance - Patient > 75%     Upper Body Dressing/Undressing Upper body dressing   What is the patient wearing?: Pull over shirt    Upper body assist Assist Level: Set up assist    Lower Body Dressing/Undressing Lower body dressing      What is the patient wearing?: Pants     Lower body assist Assist for lower body dressing: Maximal Assistance - Patient 25 - 49%     Toileting Toileting    Toileting assist Assist for toileting: Maximal Assistance - Patient 25 - 49%     Transfers Chair/bed transfer  Transfers assist     Chair/bed transfer assist level: Moderate Assistance - Patient 50 - 74%     Locomotion Ambulation   Ambulation assist      Assist level: 2 helpers Assistive device: Walker-rolling Max distance: 2 ft   Walk 10 feet activity   Assist  Walk 10 feet activity did not occur: Safety/medical concerns        Walk 50 feet activity   Assist Walk 50 feet with 2 turns activity did not occur: Safety/medical concerns  Walk 150 feet activity   Assist Walk 150 feet activity did not occur: Safety/medical concerns         Walk 10 feet on uneven surface  activity   Assist Walk 10 feet on uneven surfaces activity did not occur: Safety/medical concerns         Wheelchair     Assist Is the patient using a wheelchair?: Yes   Wheelchair activity did not occur: Safety/medical concerns         Wheelchair 50 feet with 2 turns activity    Assist    Wheelchair 50 feet with 2 turns activity did not occur: Safety/medical concerns       Wheelchair 150 feet activity     Assist  Wheelchair 150 feet activity did not occur: Safety/medical concerns       Blood pressure (!) 108/54, pulse 70, temperature 97.6 F (36.4 C), temperature source Oral, resp. rate 18, height '5\' 8"'$  (1.727 m), weight 64.3 kg, SpO2 93  %.  Medical Problem List and Plan: 1.  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  IV acyclovir completed today 06/25/2021  Continue CIR 2.  Antithrombotics: -DVT/anticoagulation: [employer]: Other (comment)             -antiplatelet therapy: N/A 3. Neuropathic pain R>L LE: -Lyrica  -increased to '75mg'$  bid  -continue tramadol as needed -lidoderm patch             Controlled on 9/17 4. Mood: Provide emotional support             -antipsychotic agents: N/A 5. Neuropsych: This patient is capable of making decisions on his own behalf. 6. Skin/Wound Care: Routine skin checks 7. Fluids/Electrolytes/Nutrition: Routine in and outs             encourage po 8.  Viral leg rash/?vasculitis.  All lesions fully crusted.  Isolation discontinued  -skin bx --perivascular dermatitis  -rx pain 9.  Hyponatremia.                    9/16 Na+ up to 140 10.  Persistent atrial fibrillation.  Cardiac rate controlled.  Continue Coreg 6.25 mg twice daily/Eliquis             Heart rate controlled on 9/17 11.  Diastolic congestive heart failure.  Ejection fraction 35 to 40% echocardiogram 2020.   Filed Weights   07/02/21 0547 07/04/21 0500 07/05/21 0506  Weight: 63 kg 63.9 kg 64.3 kg     Stable on 9/17 12.  Hypertension. Stopped Norvasc. Added clonidine patch right lower extremity for HTN and neuropathic pain  Controlled on 9/17  13.  BPH/mild hydronephrosis.  Continue Flomax 0.4 mg daily. Maintain Foley tube until follow-up outpatient urology.  Patient failed voiding trial 06/21/2021 14.  Hyperlipidemia: Lipitor 15.  L1 superior endplate compression fracture/mild edema with spinous process T4-T5, sacral insufficiency fracture.  Supportive care 16.  5 mm right lower lobe pulmonary nodule versus intrapulmonary lymph node.  Repeat noncontrast CT chest 12 months 17. Hyperkalemia: kayexalate lokelma give so far Potassium 4.8 on 9/16, labs ordered for Monday  LOS: 10 days A FACE TO FACE EVALUATION WAS  PERFORMED  Alyzza Andringa Lorie Phenix 07/05/2021, 11:07 AM

## 2021-07-05 NOTE — Progress Notes (Signed)
Physical Therapy Session Note  Patient Details  Name: Parker Odonnell. MRN: [id number] Date of Birth: Feb 05, 1932  Today's Date: 07/05/2021 PT Individual Time: 0805-0900 PT Individual Time Calculation (min): 55 min   Short Term Goals: Week 1:  PT Short Term Goal 1 (Week 1): Patient will perform bed mobility with supervision using hospital bed features. PT Short Term Goal 1 - Progress (Week 1): Met PT Short Term Goal 2 (Week 1): Patient will perform basic transfers with min A consistently. PT Short Term Goal 2 - Progress (Week 1): Met PT Short Term Goal 3 (Week 1): Patient will ambulate >10 feet with +2 assist. PT Short Term Goal 3 - Progress (Week 1): Met Week 2:  PT Short Term Goal 1 (Week 2): Patient will perform stand pivot transfers with mod A from 1 person using LRAD. PT Short Term Goal 2 (Week 2): Patient will ambulate >20 feet using LRAD with mod A of 1 person. PT Short Term Goal 3 (Week 2): Patient will tolerate standing balance with min A >3 min. Week 3:     Skilled Therapeutic Interventions/Progress Updates:   Pain:  Pt reports 5/10 RLE pain.  Treatment to tolerance.  Rest breaks and repositioning as needed.  Pt initially SUPINE and agreeable to treatment session w/focus on Litegait training. Pt supine to sit w/bed reatures, additional time, cga.   Squat pivot bed to wc w/mod assist, max cues for safety/sequencing. Pt transported to day room. Pt educated on use of Litegait/safety/harness system. Pt stood wc to LiteGait mult times, mod assist for stabilizing R knee, total assist for donning and for doffing harness.  Gait trials: 18f x 1, multimodal cues for glut/quad activation w/loading thru stance phase, cues to attend to R knee extension/heel strike, cues for posture. 352fas above, assumes crouched gait in Litegait when not consistently cues.  Wc retropulsion x 6035for bipedal strengthening activity.  W/return to room, pt engaged in "name that tune" activity  w/therapist w/good recognition and ability to recall lyrics to songs provided by therapist.  Pt transported back to room. Pt left oob in wc w/alarm belt set and needs in reach    Therapy Documentation Precautions:  Precautions Precautions: Fall Precaution Comments: R>L lower extremity weakness, pt is legally blind, has foley catheter Restrictions Weight Bearing Restrictions: No     Therapy/Group: Individual Therapy BarCallie FieldingT [place]17/2022, 12:53 PM

## 2021-07-05 NOTE — Progress Notes (Signed)
Occupational Therapy Session Note  Patient Details  Name: Parker E Shippey Jr. MRN: [id number] Date of Birth: 10/05/1932  Today's Date: 07/05/2021 OT Individual Time: 1100-1230 OT Individual Time Calculation (min): 90 min    Short Term Goals: Week 1:  OT Short Term Goal 1 (Week 1): Patient will perform sit<>stand with max A of 1 and LRAD in preparation for BADL tasks. OT Short Term Goal 1 - Progress (Week 1): Met OT Short Term Goal 2 (Week 1): Patient will perform 1 step of LB dressing task OT Short Term Goal 2 - Progress (Week 1): Met OT Short Term Goal 3 (Week 1): Pt will don shirt with min A OT Short Term Goal 3 - Progress (Week 1): Met  Skilled Therapeutic Interventions/Progress Updates:     Pt received in bed with no pain reported, but gaurding of RLE noted. Repositioning and rest provided as needed.   ADL:  Pt completes footwear with up to MOD A for sitting balnce at EOB when attempting to don socks d/t posterior lean and scoot towards EOB. Pt able to pull sock up past heel on BLE but unable to reach to toes. 2 mi stretch BLE for external rotation in hips in prep for footwear. Pt completes lateral scoot transfer to w/c with MIN A overall and facilitation of forward weight shift. At sink pt grooms with VC for visual compensatory strategy of head turns for scanning to R. Use of color as an anchor on R may be helpful. Pt shaves with electric razor with increased time d/t needing cuing for use of LUE to feel hair on face d/t visual deficits.   Therapeutic activity 6 sit to stand from w/c at sink and w/c at RW completed with mod fading to MIN A overall with R knee blocks with facilitation of hand placement to improve safety. Massed practice of transitional movements in prep for LB dressing/toileting tasks. Pt requires prolonged rest d/t fatigue  Pt left at end of session in bed with exit alarm on, call light in reach and all needs met   Therapy Documentation Precautions:   Precautions Precautions: Fall Precaution Comments: R>L lower extremity weakness, pt is legally blind, has foley catheter Restrictions Weight Bearing Restrictions: No    Therapy/Group: Individual Therapy  Stephanie M Schlosser 07/05/2021, 6:58 AM 

## 2021-07-06 NOTE — Progress Notes (Signed)
Occupational Therapy Session Note  Patient Details  Name: Parker Odonnell. MRN: [id number] Date of Birth: [birth year]  Today's Date: 07/06/2021 OT Individual Time: 1100-1155 OT Individual Time Calculation (min): 55 min    Short Term Goals: Week 1:  OT Short Term Goal 1 (Week 1): Patient will perform sit<>stand with max A of 1 and LRAD in preparation for BADL tasks. OT Short Term Goal 1 - Progress (Week 1): Met OT Short Term Goal 2 (Week 1): Patient will perform 1 step of LB dressing task OT Short Term Goal 2 - Progress (Week 1): Met OT Short Term Goal 3 (Week 1): Pt will don shirt with min A OT Short Term Goal 3 - Progress (Week 1): Met  Skilled Therapeutic Interventions/Progress Updates:    Pt resting in bed upon arrival. Pt lying in bed partially on side and face/head resting against side rail. Pt required assistance to reposition in bed. Pt agreeable to sitting EOB for bathing and dressing. Supine>sit EOB with mod A using bed rails. Pt required max multimodal cues for sequencing and technique. Dynamic sitting balance EOB for bathing/dressing with occasional min A. Pt noted with posterior lean when engaging BUE in functional tasks. See Care Tool for assist levels. Pt able to thread RLE into pants but required assistance to thread LLE. Sit<>stand from EOB with min A and max multimodal cues for technique and safety. Pt required tot A to pull pants over hips. Pt commented that his legs were getting weak while standing. Pt assisted back to EOB with mod A for safety. Pt required min A for scoot/squat transfer to w/c. Pt able to reposition self in w/c with supervision. Pt remained in w/c with all needs within reach and belt alarm activated. Pt requires more then a reasonable amount of time to initiate and complete BADLs. Pt required verbal cues to recall month, day, year. Pt thought he was in hospital in [facility]. Pt could not recall where he lived in [place].   Therapy  Documentation Precautions:  Precautions Precautions: Fall Precaution Comments: R>L lower extremity weakness, pt is legally blind, has foley catheter Restrictions Weight Bearing Restrictions: No   Pain:  Pt c/o feet and Bil knee pain (unrated); repositioned and emotional support  Therapy/Group: Individual Therapy  Leroy Libman 07/06/2021, 11:59 AM

## 2021-07-07 LAB — BASIC METABOLIC PANEL
Anion gap: 6 (ref 5–15)
BUN: 19 mg/dL (ref 8–23)
CO2: 26 mmol/L (ref 22–32)
Calcium: 8.9 mg/dL (ref 8.9–10.3)
Chloride: 106 mmol/L (ref 98–111)
Creatinine, Ser: 0.85 mg/dL (ref 0.61–1.24)
GFR, Estimated: >60 mL/min (ref >60)
Glucose, Bld: 99 mg/dL (ref 70–99)
Potassium: 4.1 mmol/L (ref 3.5–5.1)
Sodium: 138 mmol/L (ref 135–145)

## 2021-07-07 LAB — CBC
HCT: 28.9 % — ABNORMAL LOW (ref 39.0–52.0)
Hemoglobin: 9.5 g/dL — ABNORMAL LOW (ref 13.0–17.0)
MCH: 34.2 pg — ABNORMAL HIGH (ref 26.0–34.0)
MCHC: 32.9 g/dL (ref 30.0–36.0)
MCV: 104 fL — ABNORMAL HIGH (ref 80.0–100.0)
Platelets: 153 10*3/uL (ref 150–400)
RBC: 2.78 MIL/uL — ABNORMAL LOW (ref 4.22–5.81)
RDW: 14.7 % (ref 11.5–15.5)
WBC: 3.5 10*3/uL — ABNORMAL LOW (ref 4.0–10.5)
nRBC: 0 % (ref 0.0–0.2)

## 2021-07-07 NOTE — Progress Notes (Signed)
Patient ID: Parker Sink., male   DOB: [birth year], 87 y.o.   MRN: [id number]  SW spoke with Violeta Gelinas with [employer] [phone])  and discussed family education will be needed getting close to pt d/c date. Reports that she will f/u with Elroy, MSW, [place] Office: [phone] Cell: [phone] Fax: [phone]

## 2021-07-07 NOTE — Progress Notes (Signed)
PROGRESS NOTE   Subjective/Complaints: Reports improvements in his strength and pain. Has been able to sleep pretty well  ROS: Patient denies fever, rash, sore throat, blurred vision, nausea, vomiting, diarrhea, cough, shortness of breath or chest pain,   headache, or mood change.   Objective:   No results found. Recent Labs    07/07/21 0620  WBC 3.5*  HGB 9.5*  HCT 28.9*  PLT 153    Recent Labs    07/07/21 0620  NA 138  K 4.1  CL 106  CO2 26  GLUCOSE 99  BUN 19  CREATININE 0.85  CALCIUM 8.9    Intake/Output Summary (Last 24 hours) at 07/07/2021 1214 Last data filed at 07/07/2021 [id number] Gross per 24 hour  Intake --  Output 1350 ml  Net -1350 ml         Physical Exam: Vital Signs Blood pressure (!) 127/59, pulse 66, temperature 98.2 F (36.8 C), resp. rate 17, height '5\' 8"'$  (1.727 m), weight 63.3 kg, SpO2 94 %. Constitutional: No distress . Vital signs reviewed. HEENT: NCAT, EOMI, oral membranes moist Neck: supple Cardiovascular: RRR without murmur. No JVD    Respiratory/Chest: CTA Bilaterally without wheezes or rales. Normal effort    GI/Abdomen: BS +, non-tender, non-distended Ext: no clubbing, cyanosis, or edema Psych: pleasant and cooperative  Skin: healing abrasions both legs Musc: No edema in extremities.  No tenderness in extremities. Neuro: Alert Motor: Bilateral UE 5/5.  LLE 3-/5 prox to 4/5 distally.  RLE 2+/5 prox to distal (some pain inhibition--but much better)  Assessment/Plan: 1. Functional deficits which require 3+ hours per day of interdisciplinary therapy in a comprehensive inpatient rehab setting. Physiatrist is providing close team supervision and 24 hour management of active medical problems listed below. Physiatrist and rehab team continue to assess barriers to discharge/monitor patient progress toward functional and medical goals  Care Tool:  Bathing    Body parts bathed  by patient: Right arm, Left arm, Chest, Abdomen, Front perineal area, Right upper leg, Left upper leg   Body parts bathed by helper: Right lower leg, Left lower leg, Buttocks     Bathing assist Assist Level: Minimal Assistance - Patient > 75%     Upper Body Dressing/Undressing Upper body dressing   What is the patient wearing?: Pull over shirt    Upper body assist Assist Level: Set up assist    Lower Body Dressing/Undressing Lower body dressing      What is the patient wearing?: Pants     Lower body assist Assist for lower body dressing: Maximal Assistance - Patient 25 - 49%     Toileting Toileting    Toileting assist Assist for toileting: Maximal Assistance - Patient 25 - 49%     Transfers Chair/bed transfer  Transfers assist     Chair/bed transfer assist level: Moderate Assistance - Patient 50 - 74%     Locomotion Ambulation   Ambulation assist      Assist level: 2 helpers Assistive device: Walker-rolling Max distance: 2 ft   Walk 10 feet activity   Assist  Walk 10 feet activity did not occur: Safety/medical concerns        Walk  50 feet activity   Assist Walk 50 feet with 2 turns activity did not occur: Safety/medical concerns         Walk 150 feet activity   Assist Walk 150 feet activity did not occur: Safety/medical concerns         Walk 10 feet on uneven surface  activity   Assist Walk 10 feet on uneven surfaces activity did not occur: Safety/medical concerns         Wheelchair     Assist Is the patient using a wheelchair?: Yes   Wheelchair activity did not occur: Safety/medical concerns         Wheelchair 50 feet with 2 turns activity    Assist    Wheelchair 50 feet with 2 turns activity did not occur: Safety/medical concerns       Wheelchair 150 feet activity     Assist  Wheelchair 150 feet activity did not occur: Safety/medical concerns       Blood pressure (!) 127/59, pulse 66,  temperature 98.2 F (36.8 C), resp. rate 17, height '5\' 8"'$  (1.727 m), weight 63.3 kg, SpO2 94 %.  Medical Problem List and Plan: 1.  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  IV acyclovir completed today 06/25/2021  -Continue CIR therapies including PT, OT, and SLP  2.  Antithrombotics: -DVT/anticoagulation: [employer]: Other (comment)             -antiplatelet therapy: N/A 3. Neuropathic pain R>L LE: -Lyrica  -increased to '75mg'$  bid  -continue tramadol as needed -lidoderm patch             Controlled on 9/19 4. Mood: Provide emotional support             -antipsychotic agents: N/A 5. Neuropsych: This patient is capable of making decisions on his own behalf. 6. Skin/Wound Care: Routine skin checks 7. Fluids/Electrolytes/Nutrition: I personally reviewed all of the patient's labs today, and lab work is within normal limits.              encourage po 8.  Viral leg rash/?vasculitis.  All lesions fully crusted.  Isolation discontinued  -skin bx --perivascular dermatitis  -pain better  -cbc stable 9.  Hyponatremia.                    9/16 Na+ up to 140 10.  Persistent atrial fibrillation.  Cardiac rate controlled.  Continue Coreg 6.25 mg twice daily/Eliquis             Heart rate controlled on 9/19 11.  Diastolic congestive heart failure.  Ejection fraction 35 to 40% echocardiogram 2020.   Filed Weights   07/05/21 0506 07/06/21 0500 07/07/21 0600  Weight: 64.3 kg 63.2 kg 63.3 kg    Stable on 9/19 12.  Hypertension. Stopped Norvasc. Added clonidine patch right lower extremity for HTN and neuropathic pain  Controlled on 9/19  13.  BPH/mild hydronephrosis.  Continue Flomax 0.4 mg daily. Maintain Foley tube until follow-up outpatient urology.  Patient failed voiding trial 06/21/2021 14.  Hyperlipidemia: Lipitor 15.  L1 superior endplate compression fracture/mild edema with spinous process T4-T5, sacral insufficiency fracture.  Supportive care 16.  5 mm right lower lobe pulmonary nodule  versus intrapulmonary lymph node.  Repeat noncontrast CT chest 12 months 17. Hyperkalemia: kayexalate lokelma give so far Potassium 4.8 on 9/16, 4.1 9/19  LOS: 12 days A FACE TO [place] 07/07/2021, 12:14 PM

## 2021-07-07 NOTE — Progress Notes (Signed)
Speech Language Pathology Daily Session Note  Patient Details  Name: Parker Odonnell. MRN: [id number] Date of Birth: 04/04/1932  Today's Date: 07/07/2021 SLP Individual Time: 1330-1400 SLP Individual Time Calculation (min): 30 min  Short Term Goals: Week 2: SLP Short Term Goal 1 (Week 2): Patient will verbalize orientation to place, time and situation with Min A multimodal cues. SLP Short Term Goal 2 (Week 2): Patient will demonstrate functional problem solving with Min A multimodal cues. SLP Short Term Goal 3 (Week 2): Patient will identify 2 cognitive and 2 physical deficits with Mod verbal cues.  Skilled Therapeutic Interventions: Pt seen for skilled ST with focus on cognitive goals. Pt in bed with lunch tray untouched, SLP encouraging patient to eat and offered to help set up meal, however pt states he is not hungry at this time. Conversation relayed to NT. Pt independently oriented to place and requiring min A verbal and visual cues for use of external aids posted in room to increase orientation to situation, date and time. SLP facilitating functional problem solving in room environment at bed level providing mod A multimodal cues for 60% accuracy. Pt with intermittent language of confusion during session, easily redirected with verbal cues. Pt continues with decreased awareness of cognitive deficits at this time. Pt left in bed with alarm set and all needs within reach. Cont ST POC.   Pain Pain Assessment Pain Scale: 0-10 Pain Score: 0-No pain Pain Location: Leg Pain Orientation: Right Pain Descriptors / Indicators: Aching Pain Frequency: Intermittent Pain Onset: With Activity Pain Intervention(s): Medication (See eMAR)  Therapy/Group: Individual Therapy  Dewaine Conger 07/07/2021, 1:50 PM

## 2021-07-07 NOTE — Progress Notes (Signed)
Occupational Therapy Session Note  Patient Details  Name: Parker Odonnell. MRN: [id number] Date of Birth: [birth year]  Today's Date: 07/07/2021 OT Individual Time: 3614-4315  &  1300-1330 OT Individual Time Calculation (min): 59 min   &  30 min   Short Term Goals: Week 2:  OT Short Term Goal 1 (Week 2): Pt will complete sit > stand with LRAD and MOD A in prep for LB dressing OT Short Term Goal 2 (Week 2): Pt will complete transfer to elevated toilet with MOD A OT Short Term Goal 3 (Week 2): Pt will thread BLE through pants with A only for catheter mgmt OT Short Term Goal 4 (Week 2): Pt will complete donning of socks/shoes with MOD A  Skilled Therapeutic Interventions/Progress Updates:    AM session:   Patient in bed, alert.  He notes that pain is under control but has ongoing c/o of pain during change of position or light touch.  Supine to sitting edge of bed with min A.  Mild posterior lean with unsupported sitting.  Sit pivot transfer bed to w/c with mod A.  Attempted with aide assist SPT to shower bench but unable due to inaccessibility and limited standing/turning tolerance of patient this am.  Completed w/c level bathing and dressing - UB with set up/CS and increased time, LB with mod/max A and increased time, CM in stance with max A of 2 to stand and dependent washing of buttocks and pants up.  Socks and shoes max A.  Completed bilateral leg stretch and standing with RW x 2 - mod A of 2 to stand and able to maintain for approx 1-2 minutes with light weight shift - poor tolerance/knee control on right side.  He remained seated in w/c at close of session, seat belt alarm set and callbell/tray table in reach.     PM session:   Patient in bed, alert.  He states that he does not want to eat his lunch but is agreeable to sitting edge of bed for light exercise/activity.  Supine to sitting edge of bed with CGA.  He tolerates unsupported sitting with CS for 25 minutes during which time he took his  medication from nursing, completed seated trunk and balance activities with rest breaks between.  He demonstrates impaired recall/awareness at times but is cooperative.  Able to scoot laterally on edge of bed with CGA, sit to supine with CGA.  He notes pain in right leg that is relieved with repositioning on pillow.  He remained in bed at close of session, bed alarm set and call bell in hand.       Therapy Documentation Precautions:  Precautions Precautions: Fall Precaution Comments: R>L lower extremity weakness, pt is legally blind, has foley catheter Restrictions Weight Bearing Restrictions: No  Therapy/Group: Individual Therapy  Carlos Levering 07/07/2021, 7:31 AM

## 2021-07-07 NOTE — Progress Notes (Signed)
Physical Therapy Session Note  Patient Details  Name: Parker Odonnell. MRN: [id number] Date of Birth: 1932-04-08  Today's Date: 07/07/2021 PT Individual Time: 1146-1200 and 1405-1500 PT Individual Time Calculation (min): 14 min and 55 min  Short Term Goals: Week 2:  PT Short Term Goal 1 (Week 2): Patient will perform stand pivot transfers with mod A from 1 person using LRAD. PT Short Term Goal 2 (Week 2): Patient will ambulate >20 feet using LRAD with mod A of 1 person. PT Short Term Goal 3 (Week 2): Patient will tolerate standing balance with min A >3 min.  Skilled Therapeutic Interventions/Progress Updates:     Session 1: Patient in w/c in the room upon PT arrival. Patient grimacing in pain, asking to return to the bed due to R lower extremity pain from sitting. Patient reported 8/10 R>L lower extremity pain during session, RN made aware. PT provided repositioning, rest breaks, and distraction as pain interventions throughout session.   Therapeutic Activity: Bed Mobility: Patient performed sit to supine with min A for R lower extremity management. Provided verbal cues for for patient to lift his R leg onto the bed, however, patient became upset due to elevated pain levels and PT provided assist. Transfers: Patient performed squat pivot w/c>bed with mod A. Provided verbal cues for initiation, patient initially resistive due to not having a second person present, head-hips relationship, and hand placement.  Attempted to provide additional interventions for pain management, offered stretching, massage, or upper extremity exercises bed level. Patient declined all interventions at this time due to pain and fatigue from sitting up.   Patient in bed at end of session with breaks locked, bed alarm set, and all needs within reach. Patient missed 15 min of skilled PT due to pain, RN made aware. Will attempt to make-up missed time as able.    Session 2: Patient in bed upon PT arrival. Patient  alert and reluctant to participate in PT session due to fatigue, stated, "I can only do what I can do." Patient agreeable to discussion of pain management and lower extremity exercises in the bed. Patient reported 4-7/10 R lower extremity pain during session, RN made aware. PT provided repositioning, rest breaks, and distraction as pain interventions throughout session.   Noted patient had an untouched lunch tray at bedside, patient reported having several BM's this morning, reports every 30-40 min, and stomach upset causing him to not have an appetite. Discussed with RN who reported that the patient had one BM this morning and no reports of stomach upset. Stated patient was sleeping when lunch arrived.   Informed patient of RN report and patient stated, "maybe that's right." Progressed to education on pain management and calling for pain medicine prior to therapy sessions, using scooting and shifting techniques to reposition in sitting or lying, and use of ice or heat to manage symptoms. Patient is "cold natured" and does not want to try ice, and declined heat at this time. Patient reports symptoms are best managed when he is lying in the bed. Patient will need reinforcement of education due to memory deficits.  Assessed patient's sensation and proprioception bed level. Sensation intact with improved, but still present, hyperalgia to light touch to R lower extremity. Proprioception impaired at B first MCP joint of great toe, 50% accuracy, and intact at ankle joint, 100% accuracy.   Therapeutic Exercise: Patient performed the following exercises with verbal and tactile cues for proper technique. -B heel cord and hamstring stretch 2x1 min -  R heel slides 2x10 -B clam shells in side-lying 2x10 -bridging 2x10 -R SLR 2x10 with <50% assist -R SAQ 2x10 with <25% assist  Patient in bed at end of session with breaks locked, bed alarm set, and all needs within reach.    Therapy Documentation Precautions:   Precautions Precautions: Fall Precaution Comments: R>L lower extremity weakness, pt is legally blind, has foley catheter Restrictions Weight Bearing Restrictions: No General: PT Amount of Missed Time (min): 16 Minutes PT Missed Treatment Reason: Pain    Therapy/Group: Individual Therapy  Robin Petrakis L Shanece Cochrane PT, DPT  07/07/2021, 3:57 PM

## 2021-07-08 MED ORDER — PREGABALIN 25 MG PO CAPS
50.0000 mg | ORAL_CAPSULE | Freq: Two times a day (BID) | ORAL | Status: DC
  Administered 2021-07-08 – 2021-07-09 (×3): 50 mg via ORAL
  Filled 2021-07-08 (×3): qty 2

## 2021-07-08 NOTE — Progress Notes (Signed)
Speech Language Pathology Daily Session Note  Patient Details  Name: Parker Odonnell. MRN: [id number] Date of Birth: 09-19-1932  Today's Date: 07/08/2021 SLP Individual Time: 1430-1530 SLP Individual Time Calculation (min): 60 min  Short Term Goals: Week 2: SLP Short Term Goal 1 (Week 2): Patient will verbalize orientation to place, time and situation with Min A multimodal cues. SLP Short Term Goal 2 (Week 2): Patient will demonstrate functional problem solving with Min A multimodal cues. SLP Short Term Goal 3 (Week 2): Patient will identify 2 cognitive and 2 physical deficits with Mod verbal cues.  Skilled Therapeutic Interventions: Patient agreeable to skilled ST intervention with focus on cognitive goals. SLP facilitated session by providing min A verbal cues for use of external orientation aids to orient to situation, day of week, and month. SLP also facilitated session by providing min-to-mod A verbal cues for problem solving, abstract reasoning with jeopardy style type task. Pt exhibited improved awareness of cognitive deficits as evidenced by report "I need help with my memory" and "I don't think as fast." Patient was left in chair with alarm activated and immediate needs within reach at end of session. Continue per current plan of care.      Pain Pain Assessment Pain Scale: 0-10 Pain Score: 0-No pain  Therapy/Group: Individual Therapy  Patty Sermons 07/08/2021, 3:34 PM

## 2021-07-08 NOTE — Patient Care Conference (Signed)
Inpatient RehabilitationTeam Conference and Plan of Care Update Date: 07/08/2021   Time: 10:17 AM    Patient Name: Parker Odonnell.      Medical Record Number: [id number]  Date of Birth: [birth year] Sex: Male         Room/Bed: 9D63O/7F64P-32 Payor Info: Payor: Marine scientist / Plan: [facility] MEDICARE / Product Type: *No Product type* /    Admit Date/Time:  06/25/2021  5:42 PM  Primary Diagnosis:  VZV (varicella-zoster virus) infection  Hospital Problems: Principal Problem:   VZV (varicella-zoster virus) infection Active Problems:   Pressure injury of skin   Hyperkalemia   Hyponatremia   Neuropathic pain   Malnutrition of moderate degree    Expected Discharge Date: Expected Discharge Date: 07/18/21  Team Members Present: Physician leading conference: Dr. Alger Simons Social Worker Present: Loralee Pacas, [place] Nurse Present: Dorthula Nettles, RN PT Present: Apolinar Junes, PT OT Present: Elisabeth Most, OT SLP Present: Weston Anna, SLP PPS Coordinator present : Gunnar Fusi, SLP     Current Status/Progress Goal Weekly Team Focus  Bowel/Bladder   foley cath in, incontinent of bowel  Patient will regain continence of bowel. Follow up with provider/urology to discuss removing indwelling foley catheter when appropriate.  offer q 2 toileting and prn   Swallow/Nutrition/ Hydration             ADL's   UB adl CS/set up, LB adl mod/max A with increased time, unable to perform stand pivot transfer, mod A of 2 for sit to stand , mod A sit pivot  Supervision/CGA  transfer training, adl training, standing tolerance, general conditioning, pain management   Mobility   Mod A overall, +2 assist for safety, gait 20 feet with RW min-mod +2  Downgraded to min A  [place], activity tolerance, balance, gait training, transfer training, R LE NMR, patient/caregiver education   Communication             Safety/Cognition/ Behavioral Observations  Min-Mod A  Min A  use of  external aids for orientation and recall of functional information, problem solving   Pain   paient denies pain  Patient will have a pain level of <=3  assess patient for pain q shift and prn   Skin   CDI  no new breakdown  encourage 2 hour turn schedule. assess wound to buttocks q shift and prn     Discharge Planning:  Pt lives in [place], and has hired caregivers.   Team Discussion: RLE responding to treatment, cardiac managed, cardiovascular dermatitis to leg. Chronic foley in place, incontinent bowel, LBM 9/19. Tylenol, Tramadol, and Lidoderm patch for reported pain. Skin is CDI. Nursing educating on pain and medication management, safety, and foley care. Nursing reports patient does Sundown in the late afternoons. Complains that he "can't" but continues on. Posterior lean present. Set-up for ADL's, lower body is max assist, standing or pivoting is now dependent. He is not progressing. Problems with motor planning, carry over. Max assist with 1st stand today, mod assist 2nd stand. Initiated Agricultural consultant, downgrading therapy goals. Had more awareness last week. Will discharge at a W/C level.   Patient on target to meet rehab goals: no, therapy will downgrade discharge goals.  *See Care Plan and progress notes for long and short-term goals.   Revisions to Treatment Plan:  Not at this time.  Teaching Needs: Family education, medication management, pain management, skin/wound care, bowel/bladder management, foley care education, transfer training, slide board training, W/C education, safety  awareness.  Current Barriers to Discharge: Decreased caregiver support, Medical stability, Home enviroment access/layout, Incontinence, Lack of/limited family support, and Medication compliance  Possible Resolutions to Barriers: Continue current medications for pain management, MD and nursing to monitor Sundowning episodes, family to come in for family education. Nursing to provide Foley care  education and incontinence management to family.     Medical Summary Current Status: leg pain improving, tolerating meds. weights balanced and bp's controlled  Barriers to Discharge: Medical stability   Possible Resolutions to Barriers/Weekly Focus: daily assessmnet of pt data and vs. pain control   Continued Need for Acute Rehabilitation Level of Care: The patient requires daily medical management by a physician with specialized training in physical medicine and rehabilitation for the following reasons: Direction of a multidisciplinary physical rehabilitation program to maximize functional independence : Yes Medical management of patient stability for increased activity during participation in an intensive rehabilitation regime.: Yes Analysis of laboratory values and/or radiology reports with any subsequent need for medication adjustment and/or medical intervention. : Yes   I attest that I was present, lead the team conference, and concur with the assessment and plan of the team.   Cristi Loron 07/08/2021, 12:58 PM

## 2021-07-08 NOTE — Plan of Care (Signed)
  Problem: RH Ambulation Goal: LTG Patient will ambulate in controlled environment (PT) Description: LTG: Patient will ambulate in a controlled environment, # of feet with assistance (PT). Outcome: Not Progressing Flowsheets (Taken 07/08/2021 1018) LTG: Pt will ambulate in controlled environ  assist needed:: (Discharge goal due to lack of progress due to poor motor planning and decreased carryover with motor learning.) -- Note: Discharge goal due to lack of progress secondary to poor motor planning and decreased carryover with motor learning.  Goal: LTG Patient will ambulate in home environment (PT) Description: LTG: Patient will ambulate in home environment, # of feet with assistance (PT). Outcome: Not Progressing Flowsheets (Taken 07/08/2021 1018) LTG: Pt will ambulate in home environ  assist needed:: (Discharge goal due to lack of progress secondary to poor motor planning and decreased carryover with motor learning.) -- Note: Discharge goal due to lack of progress secondary to poor motor planning and decreased carryover with motor learning.    Problem: RH Balance Goal: LTG Patient will maintain dynamic standing balance (PT) Description: LTG:  Patient will maintain dynamic standing balance with assistance during mobility activities (PT) Flowsheets (Taken 07/08/2021 1018) LTG: Pt will maintain dynamic standing balance during mobility activities with:: (downgraded due to poor motor planning and decreased carryover with motor learning.) Minimal Assistance - Patient > 75% Note: downgraded due to poor motor planning and decreased carryover with motor learning.    Problem: Sit to Stand Goal: LTG:  Patient will perform sit to stand with assistance level (PT) Description: LTG:  Patient will perform sit to stand with assistance level (PT) Flowsheets (Taken 07/08/2021 1018) LTG: PT will perform sit to stand in preparation for functional mobility with assistance level: (downgraded due to poor motor  planning and decreased carryover with motor learning.) Minimal Assistance - Patient > 75% Note: downgraded due to poor motor planning and decreased carryover with motor learning.    Problem: RH Bed Mobility Goal: LTG Patient will perform bed mobility with assist (PT) Description: LTG: Patient will perform bed mobility with assistance, with/without cues (PT). Flowsheets (Taken 06/26/2021 1945) LTG: Pt will perform bed mobility with assistance level of: Independent with assistive device

## 2021-07-08 NOTE — Progress Notes (Signed)
Physical Therapy Session Note  Patient Details  Name: Parker Odonnell. MRN: [id number] Date of Birth: 09-14-1932  Today's Date: 07/08/2021 PT Individual Time: 0900-0955 PT Individual Time Calculation (min): 55 min   Short Term Goals: Week 2:  PT Short Term Goal 1 (Week 2): Patient will perform stand pivot transfers with mod A from 1 person using LRAD. PT Short Term Goal 2 (Week 2): Patient will ambulate >20 feet using LRAD with mod A of 1 person. PT Short Term Goal 3 (Week 2): Patient will tolerate standing balance with min A >3 min.  Skilled Therapeutic Interventions/Progress Updates:     Patient in bed asleep upon PT arrival. Patient easily aroused and agreeable to PT session. Patient reported 6/10 R lower extremity pain with movement during session, RN made aware. PT provided repositioning, rest breaks, and distraction as pain interventions throughout session.   Therapeutic Activity: Bed Mobility: Patient performed supine to sit with supervision and increased time in a flat bed with use of bed rail. Provided verbal cues for rolling through side-lying for reduced effort with mobility. Transfers: Patient performed sit to/from stand with max A x1 due to posterior lean and min A x1 with +2 assist for stabilizing RW. He performed stand pivot using RW with min-mod A +2 for balance and AD management due to posterior bias, assist for R foot placement, and max cues for sequencing and safety due to motor planning deficits.  Patient performed slide board transfers w/c<>mat table x2 with mod A x1 and CGA x3 and total A for board placement. Provided cues for hand placement, board placement, and head-hips relationship for proper technique and decreased assist with transfers.   Gait Training:  Donned tennis shoes and R AFO with anterior strut for improved knee stability in stance with total A prior to gait training. Patient ambulated 10 feet using RW with min-mod A +2. Ambulated with decreased L foot  clearance, crouched gait, decreased R knee extension and unable to follow max multimodal cues to extend in stance.   Wheelchair Mobility:  Patient propelled wheelchair 30 feet, limited by fatigue with supervision. Provided verbal cues for propulsion technique.  Patient in [place] w/c in the room in ~10 deg tilt to reduce posterior pelvic tilt and anterior sliding in the w/c for increased safety and sitting tolerance at end of session with breaks locked, seat belt alarm set, and all needs within reach.   Therapy Documentation Precautions:  Precautions Precautions: Fall Precaution Comments: R>L lower extremity weakness, pt is legally blind, has foley catheter Restrictions Weight Bearing Restrictions: No    Therapy/Group: Individual Therapy  Duquan Gillooly L Nain Rudd PT, DPT  07/08/2021, 4:25 PM

## 2021-07-08 NOTE — Progress Notes (Signed)
Nutrition Follow-up  DOCUMENTATION CODES:   Non-severe (moderate) malnutrition in context of chronic illness  INTERVENTION:  Continue Ensure Enlive po TID, each supplement provides 350 kcal and 20 grams of protein   Encourage adequate PO intake.  NUTRITION DIAGNOSIS:   Moderate Malnutrition related to chronic illness (CHF) as evidenced by moderate fat depletion, moderate muscle depletion; ongoing  GOAL:   Patient will meet greater than or equal to 90% of their needs; progressing  MONITOR:   PO intake, Supplement acceptance, Labs, Weight trends, Skin, I & O's  REASON FOR ASSESSMENT:   Malnutrition Screening Tool    ASSESSMENT:   87 year old with history of CAD, legally blind, diastolic congestive heart failure, hyperlipidemia, as well as left hip replacement. He presented on 06/10/2021 with right leg pain since 05/30/21 as well as recent fall. bilateral lower extremity weakness right more than left/purpuric right lower extremity rash. Given lymphocytic pleocytosis and elevated protein on CSF, VZV PCR now positive. Patient decreased functional as well as cognitive ability was admitted for a comprehensive rehab program.  Meal completion has been varied from 25-80%. Pt has been tolerating his PO diet. Pt currently has Ensure ordered and has been consuming them. RD to continue with current orders to aid in adequate nutrient needs. Labs and medications reviewed.   Diet Order:   Diet Order             Diet regular Room service appropriate? No; Fluid consistency: Thin  Diet effective now                   EDUCATION NEEDS:   Not appropriate for education at this time  Skin:  Skin Assessment: Reviewed RN Assessment Skin Integrity Issues:: Stage II Stage II: N/A  Last BM:  9/19  Height:   Ht Readings from Last 1 Encounters:  06/25/21 5\' 8"  (1.727 m)    Weight:   Wt Readings from Last 1 Encounters:  07/08/21 63.4 kg   BMI:  Body mass index is 21.25  kg/m.  Estimated Nutritional Needs:   Kcal:  1750-1900  Protein:  80-90 grams  Fluid:  >/= 1.7 L/day  Corrin Parker, MS, RD, LDN RD pager number/after hours weekend pager number on Am[employer].

## 2021-07-08 NOTE — Progress Notes (Signed)
Patient ID: Parker Sink., male   DOB: 1932-03-23, 87 y.o.   MRN: [id number]  SW returned phone call/left message for dtr [name] ([phone]) to clarify family education is needed  with the aides that are working with him vs family meeting. SW requested follow-up to give details on pt care needed at discharge.  *SW returned phone call to pt dtr [name] to discuss pt d/c to home. SW informed on continued physical assistance pt requires at this time, and suggested family education with the aides that will be assisting him. SW asked pt dtr to have homecare agency follow-up with SW to ensure they can continue to provide care to patient, as well as, come in for family education. Dtr intends to explore this and will follow-up with SW.   SW spoke with Violeta Gelinas with [employer] [phone]) to provide updates from team conference, and above information. Reports she will share updates with director of site today, and will f/u with [place], MSW, [place] Office: [phone] Cell: [phone] Fax: [phone]

## 2021-07-08 NOTE — Progress Notes (Signed)
Occupational Therapy Session Note  Patient Details  Name: Parker Odonnell. MRN: [id number] Date of Birth: Nov 28, 1931  Today's Date: 07/08/2021 OT Individual Time: 0865-7846 OT Individual Time Calculation (min): 57 min    Short Term Goals: Week 2:  OT Short Term Goal 1 (Week 2): Pt will complete sit > stand with LRAD and MOD A in prep for LB dressing OT Short Term Goal 2 (Week 2): Pt will complete transfer to elevated toilet with MOD A OT Short Term Goal 3 (Week 2): Pt will thread BLE through pants with A only for catheter mgmt OT Short Term Goal 4 (Week 2): Pt will complete donning of socks/shoes with MOD A  Skilled Therapeutic Interventions/Progress Updates:    Patient in bed, alert, states that he doesn't have an appetite and has multiple c/o pain in legs with transitional movement.  Supine to sitting edge of bed with CS/CGA.  Able to tolerate unsupported sitting with occ posterior lean for 15 minutes with oral care - set up, washing face and using electric razor.  Moved to supine position for rest break with min A.  Completed rolling in bed and peri/buttocks care - mod A, donned incontinence brief max A/dep.  Returned to sitting edge of bed with CS/CGA, completed UB bathing and dressing CS, LB dressing max A, CM in stance dependent, max A to come to standing position.  Max/dep for foot wear.  Returned to supine position due to fatigue with min A.  He remained in bed at close of session, bed alarm set and call bell in hand.    Therapy Documentation Precautions:  Precautions Precautions: Fall Precaution Comments: R>L lower extremity weakness, pt is legally blind, has foley catheter Restrictions Weight Bearing Restrictions: No   Therapy/Group: Individual Therapy  Carlos Levering 07/08/2021, 7:24 AM

## 2021-07-08 NOTE — Progress Notes (Signed)
PROGRESS NOTE   Subjective/Complaints: Pain still controlled. Therapy has noticed that he's been more sluggish and slower to process. Goals downgraded as a result  ROS: Patient denies fever, rash, sore throat, blurred vision, nausea, vomiting, diarrhea, cough, shortness of breath or chest pain,   headache, or mood change.    Objective:   No results found. Recent Labs    07/07/21 0620  WBC 3.5*  HGB 9.5*  HCT 28.9*  PLT 153    Recent Labs    07/07/21 0620  NA 138  K 4.1  CL 106  CO2 26  GLUCOSE 99  BUN 19  CREATININE 0.85  CALCIUM 8.9    Intake/Output Summary (Last 24 hours) at 07/08/2021 1203 Last data filed at 07/08/2021 2025 Gross per 24 hour  Intake 297 ml  Output 1860 ml  Net -1563 ml         Physical Exam: Vital Signs Blood pressure (!) 114/56, pulse (!) 52, temperature 98.6 F (37 C), temperature source Oral, resp. rate 17, height 5\' 8"  (1.727 m), weight 63.4 kg, SpO2 94 %. Constitutional: No distress . Vital signs reviewed. HEENT: NCAT, EOMI, oral membranes moist Neck: supple Cardiovascular: RRR without murmur. No JVD    Respiratory/Chest: CTA Bilaterally without wheezes or rales. Normal effort    GI/Abdomen: BS +, non-tender, non-distended Ext: no clubbing, cyanosis, or edema Psych: pleasant and cooperative  Skin: healing abrasions both legs Musc: No edema in extremities.  No tenderness in extremities. Neuro: Alert, fair insight and awareness. Stm deficits Motor: Bilateral UE 5/5.  LLE 3-/5 prox to 4/5 distally.  RLE 2+/5 prox to distal (some pain inhibition--but much better)  Assessment/Plan: 1. Functional deficits which require 3+ hours per day of interdisciplinary therapy in a comprehensive inpatient rehab setting. Physiatrist is providing close team supervision and 24 hour management of active medical problems listed below. Physiatrist and rehab team continue to assess barriers to  discharge/monitor patient progress toward functional and medical goals  Care Tool:  Bathing    Body parts bathed by patient: Right arm, Left arm, Chest, Abdomen, Front perineal area, Right upper leg, Left upper leg, Face   Body parts bathed by helper: Right lower leg, Left lower leg, Buttocks     Bathing assist Assist Level: Moderate Assistance - Patient 50 - 74%     Upper Body Dressing/Undressing Upper body dressing   What is the patient wearing?: Pull over shirt    Upper body assist Assist Level: Supervision/Verbal cueing    Lower Body Dressing/Undressing Lower body dressing      What is the patient wearing?: Pants, Incontinence brief     Lower body assist Assist for lower body dressing: Maximal Assistance - Patient 25 - 49%     Toileting Toileting    Toileting assist Assist for toileting: Maximal Assistance - Patient 25 - 49%     Transfers Chair/bed transfer  Transfers assist     Chair/bed transfer assist level: Moderate Assistance - Patient 50 - 74%     Locomotion Ambulation   Ambulation assist      Assist level: 2 helpers Assistive device: Walker-rolling Max distance: 2 ft   Walk 10 feet activity  Assist  Walk 10 feet activity did not occur: Safety/medical concerns        Walk 50 feet activity   Assist Walk 50 feet with 2 turns activity did not occur: Safety/medical concerns         Walk 150 feet activity   Assist Walk 150 feet activity did not occur: Safety/medical concerns         Walk 10 feet on uneven surface  activity   Assist Walk 10 feet on uneven surfaces activity did not occur: Safety/medical concerns         Wheelchair     Assist Is the patient using a wheelchair?: Yes   Wheelchair activity did not occur: Safety/medical concerns         Wheelchair 50 feet with 2 turns activity    Assist    Wheelchair 50 feet with 2 turns activity did not occur: Safety/medical concerns        Wheelchair 150 feet activity     Assist  Wheelchair 150 feet activity did not occur: Safety/medical concerns       Blood pressure (!) 114/56, pulse (!) 52, temperature 98.6 F (37 C), temperature source Oral, resp. rate 17, height 5\' 8"  (1.727 m), weight 63.4 kg, SpO2 94 %.  Medical Problem List and Plan: 1.  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  IV acyclovir completed today 06/25/2021  -Continue CIR therapies including PT, OT, and SLP   -team conf today 2.  Antithrombotics: -DVT/anticoagulation: [employer]: Other (comment)             -antiplatelet therapy: N/A 3. Neuropathic pain R>L LE: -Lyrica    75mg  TID -continue tramadol as needed -lidoderm patch             9/20 given reports of lethargy/confusion--decrease lyrica to 50mg  bid 4. Mood: Provide emotional support             -antipsychotic agents: N/A 5. Neuropsych: This patient is capable of making decisions on his own behalf. 6. Skin/Wound Care: Routine skin checks 7. Fluids/Electrolytes/Nutrition: I personally reviewed all of the patient's labs today, and lab work is within normal limits.              encouraging po 8.  Viral leg rash/?vasculitis.  All lesions fully crusted.  Isolation discontinued  -skin bx --perivascular dermatitis  -pain better  -cbc stable 9.  Hyponatremia.                    9/16 Na+ up to 140 10.  Persistent atrial fibrillation.  Cardiac rate controlled.  Continue Coreg 6.25 mg twice daily/Eliquis             Heart rate controlled on 9/20 11.  Diastolic congestive heart failure.  Ejection fraction 35 to 40% echocardiogram 2020.   Filed Weights   07/06/21 0500 07/07/21 0600 07/08/21 0500  Weight: 63.2 kg 63.3 kg 63.4 kg    Stable on 9/19 12.  Hypertension. Stopped Norvasc. Added clonidine patch right lower extremity for HTN and neuropathic pain  Controlled on 9/20 13.  BPH/mild hydronephrosis.  Continue Flomax 0.4 mg daily. Maintain Foley tube until follow-up outpatient  urology.  Patient failed voiding trial 06/21/2021 14.  Hyperlipidemia: Lipitor 15.  L1 superior endplate compression fracture/mild edema with spinous process T4-T5, sacral insufficiency fracture.  Supportive care 16.  5 mm right lower lobe pulmonary nodule versus intrapulmonary lymph node.  Repeat noncontrast CT chest 12 months 17. Hyperkalemia: kayexalate lokelma give so far  Potassium 4.8 on 9/16, 4.1 9/19  LOS: 13 days A FACE TO [place] 07/08/2021, 12:03 PM

## 2021-07-09 MED ORDER — ONDANSETRON HCL 4 MG PO TABS
4.0000 mg | ORAL_TABLET | Freq: Three times a day (TID) | ORAL | Status: DC | PRN
  Administered 2021-07-09: 4 mg via ORAL
  Filled 2021-07-09: qty 1

## 2021-07-09 NOTE — Progress Notes (Signed)
Physical Therapy Session Note  Patient Details  Name: Parker Odonnell. MRN: [id number] Date of Birth: Nov 01, 1931  Today's Date: 07/09/2021 PT Missed Time: 81 Minutes Missed Time Reason: Patient fatigue  Short Term Goals: Week 2:  PT Short Term Goal 1 (Week 2): Patient will perform stand pivot transfers with mod A from 1 person using LRAD. PT Short Term Goal 1 - Progress (Week 2): Not progressing PT Short Term Goal 2 (Week 2): Patient will ambulate >20 feet using LRAD with mod A of 1 person. PT Short Term Goal 2 - Progress (Week 2): Not progressing PT Short Term Goal 3 (Week 2): Patient will tolerate standing balance with min A >3 min. PT Short Term Goal 3 - Progress (Week 2): Progressing toward goal  Skilled Therapeutic Interventions/Progress Updates:     Pt supine in bed asleep. PT will follow up as possible.  Therapy Documentation Precautions:  Precautions Precautions: Fall Precaution Comments: R>L lower extremity weakness, pt is legally blind, has foley catheter Restrictions Weight Bearing Restrictions: No General: PT Amount of Missed Time (min): 45 Minutes PT Missed Treatment Reason: Patient fatigue   Therapy/Group: Individual Therapy  Breck Coons, PT, DPT 07/09/2021, 4:05 PM

## 2021-07-09 NOTE — Plan of Care (Signed)
  Problem: RH Wheelchair Mobility Goal: LTG Patient will propel w/c in controlled environment (PT) Description: LTG: Patient will propel wheelchair in controlled environment, # of feet with assist (PT) Flowsheets (Taken 07/09/2021 0551) LTG: Pt will propel w/c in controlled environ  assist needed:: Supervision/Verbal cueing LTG: Propel w/c distance in controlled environment: 100 ft Goal: LTG Patient will propel w/c in home environment (PT) Description: LTG: Patient will propel wheelchair in home environment, # of feet with assistance (PT). Flowsheets (Taken 07/09/2021 0551) LTG: Pt will propel w/c in home environ  assist needed:: Supervision/Verbal cueing LTG: Propel w/c distance in home environment: 50 ft

## 2021-07-09 NOTE — Progress Notes (Signed)
Physical Therapy Weekly Progress Note  Patient Details  Name: Parker Odonnell. MRN: [id number] Date of Birth: [birth year]  Beginning of progress report period: July 02, 2021 End of progress report period: July 10, 2019  Today's Date: 07/09/2021 PT Individual Time: 1035-1130 PT Individual Time Calculation (min): 55 min   Patient has met 0 of 3 short term goals.  Patient with fluctuations in performance with decline in progress towards transfer and gait goals using RW, limited by decreased activity tolerance, fear of falling, motor planning deficits, and poor carry-over and recall between day to day sessions. Patient currently requires min A for bed mobility, min-mod A for sit to stand transfers, CGA slide board transfers, and mod A +2 for stand pivot transfers and gait with RW 30 feet x1 (unable to replicate performance since). Changed POC due to limited progress toward LTGs to reflect w/c level goals at supervision level and sit to stand at min A using LRAD for functional activities/ADLs.   Patient continues to demonstrate the following deficits muscle weakness and muscle joint tightness, decreased cardiorespiratoy endurance, impaired timing and sequencing, unbalanced muscle activation, decreased coordination, and decreased motor planning, decreased visual acuity, decreased attention, decreased awareness, decreased problem solving, decreased memory, and delayed processing, and decreased sitting balance, decreased standing balance, decreased postural control, and decreased balance strategies and therefore will continue to benefit from skilled PT intervention to increase functional independence with mobility.  Patient not progressing toward long term goals.  See goal revision..  Plan of care revisions: (see above).  PT Short Term Goals Week 2:  PT Short Term Goal 1 (Week 2): Patient will perform stand pivot transfers with mod A from 1 person using LRAD. PT Short Term Goal 1 - Progress  (Week 2): Not progressing PT Short Term Goal 2 (Week 2): Patient will ambulate >20 feet using LRAD with mod A of 1 person. PT Short Term Goal 2 - Progress (Week 2): Not progressing PT Short Term Goal 3 (Week 2): Patient will tolerate standing balance with min A >3 min. PT Short Term Goal 3 - Progress (Week 2): Progressing toward goal Week 3:  PT Short Term Goal 1 (Week 3): STGs=LTGs due to ELOS  Skilled Therapeutic Interventions/Progress Updates:     Patient in bed upon PT arrival. Patient alert and agreeable to PT session. Patient reported un-rated R lower extremity pain during session, RN made aware. PT provided repositioning, rest breaks, and distraction as pain interventions throughout session. Patient also reports bouts of emesis this morning and generalized feeling "unwell." Noted to have intermittent confusion and poor frustration tolerance throughout session. RN aware. Discussed patient's decline in function this week with MD during team conference yesterday.   Therapeutic Activity: Bed Mobility: Patient performed supine to/from sit with supervision with the bed flat and use of 1 bed rail. Provided verbal cues for rolling through side-lying, however, patient initiated this following cues then returned to supine to pull up to long sit using rail. Transfers: Patient performed sit to/from stand x1 with mod A of 1 and a second person to stabilize the RW, required 2 attempts due to posterior bias. Provided verbal cues for scooting forward, foot placement, and forward weight shift. Patient performed blocked practice slide board transfers bed<>w/c x4 progressing from min A to close supervision and total A-min A for board placement. Provided cues for hand placement, board placement, and head-hips relationship for proper technique and decreased assist with transfers.   Gait Training:  Patient ambulated 5 ft feet  using RW with mod-max +2. Ambulated with crouched posture, poor motor planning to follow  multimodal cues for R foot placement and knee/hip/trunk extension, terminated gait training due to safety and patient with poor frustration tolerance with task.   Wheelchair Mobility:  Patient propelled wheelchair >150 feet with supervision. Provided verbal cues for turning technique, equal B upper extremity propulsion, and locking/unlocking breaks throughout session.   Patient in w/c in bed due to increased fatigue at end of session with breaks locked, bed alarm set, and all needs within reach.   Therapy Documentation Precautions:  Precautions Precautions: Fall Precaution Comments: R>L lower extremity weakness, pt is legally blind, has foley catheter Restrictions Weight Bearing Restrictions: No  Therapy/Group: Individual Therapy  Jacquese Hackman L Deseri Loss PT, DPT  07/09/2021, 4:20 PM

## 2021-07-09 NOTE — Progress Notes (Signed)
Occupational Therapy Session Note  Patient Details  Name: Parker Odonnell. MRN: [id number] Date of Birth: [birth year]  Today's Date: 07/09/2021 OT Individual Time: 0905-1000 OT Individual Time Calculation (min): 55 min   OT Individual Time: 0905-1000 OT Individual Time Calculation (min): 29 mins  Short Term Goals: Week 1:  OT Short Term Goal 1 (Week 1): Patient will perform sit<>stand with max A of 1 and LRAD in preparation for BADL tasks. OT Short Term Goal 1 - Progress (Week 1): Met OT Short Term Goal 2 (Week 1): Patient will perform 1 step of LB dressing task OT Short Term Goal 2 - Progress (Week 1): Met OT Short Term Goal 3 (Week 1): Pt will don shirt with min A OT Short Term Goal 3 - Progress (Week 1): Met Week 2:  OT Short Term Goal 1 (Week 2): Pt will complete sit > stand with LRAD and MOD A in prep for LB dressing OT Short Term Goal 2 (Week 2): Pt will complete transfer to elevated toilet with MOD A OT Short Term Goal 3 (Week 2): Pt will thread BLE through pants with A only for catheter mgmt OT Short Term Goal 4 (Week 2): Pt will complete donning of socks/shoes with MOD A  Skilled Therapeutic Interventions/Progress Updates:     Pt received in bed with HOB elevated, pt in twisted position with c/o nausea, being cold, and requesting medication. Pt unaware that it was daytime until curtain was lifted. Repositioning provided for nausea relief - NT made aware that pt was nauseas with evidence of vomiting on bed. Pt agreed to changing shirt and washing up at bed level after encouragement provided.   ADL: Pt completes BADL at overall MOD-MAX A level d/t not feeling well today. Skilled interventions include: Pt completes UB dressing/bathing at bed level with MOD A for redirection from perseveration on holding wash cloth to face. Needed MIN A for orienting shirt to self when donning. RN came in to check on pt - made aware of c/o nausea. Pt agitated with cues from OTS when pulling shirt  down over stomach, demonstrating low frustration tolerance. Brushed teeth sitting in bed with setup. Able to come sit EOB with MIN A. Required MOD - MAX A x 2 for sit > stand to pull pants over hips. Pt unable to follow direction to push up from bed when standing - needed tactile and VC for direction. Lack of forward weight shift - OT facilitates for improved body mechanics. Pants donned with MAX A for catheter management. Pt needed MIN A OT assist to position legs on bed when moving EOB > supine. Extra blanket provided upon pt request. Pt left at end of session in bed with exit alarm on, call light in reach and all needs met.   Session II:  Pt received in bed with no c/o pain and agreeable to OT. Pt reported that he was feeling better from this morning. Session today focused on assessing pt carryover from previous education on sock aid for donning socks. Overall, pt performance fluctuates due to motor planning, cognitive deficits and poor activity tolerance. Noted agitation, low frusteration tolerance and increased anxiety during tx today.   ADL: Pt completes BADL at overall MOD-MAX A level. Skilled interventions include: Pt needed MIN A for bed mobility supine > EOB for support of weak R LE. Pt demonstrated poor carryover with sock aid. Demonstrated poor awareness of sensation of socks on feet, did not know which foot had sock on or  off. Pt had difficult time following 1-step commands and understanding task. Pt needed MAX multimodal cuing to setup sock aid sequentially. R sock donned with use of sock aid, and MIN A tactile, MAX A cuing. MIN A cuing required for scooting up in bed with tactile guidance for bridging at hips. Pt left at end of session in bed with HOB at 30 degrees with exit alarm on, call light in reach and all needs met.      Therapy Documentation Precautions:  Precautions Precautions: Fall Precaution Comments: R>L lower extremity weakness, pt is legally blind, has foley  catheter Restrictions Weight Bearing Restrictions: No    Therapy/Group: Individual Therapy  Horace Lukas 07/09/2021, 7:00 AM

## 2021-07-10 MED ORDER — PREGABALIN 25 MG PO CAPS
25.0000 mg | ORAL_CAPSULE | Freq: Two times a day (BID) | ORAL | Status: DC
  Administered 2021-07-10 – 2021-07-11 (×2): 25 mg via ORAL
  Filled 2021-07-10 (×2): qty 1

## 2021-07-10 NOTE — Progress Notes (Signed)
Speech Language Pathology Weekly Progress and Session Note  Patient Details  Name: Parker Odonnell. MRN: [id number] Date of Birth: 1932/08/12  Beginning of progress report period: July 03, 2021 End of progress report period: July 10, 2021  Today's Date: 07/10/2021 SLP Individual Time: 0865-7846 SLP Individual Time Calculation (min): 23 min  Short Term Goals: Week 2: SLP Short Term Goal 1 (Week 2): Patient will verbalize orientation to place, time and situation with Min A multimodal cues. SLP Short Term Goal 1 - Progress (Week 2): Met SLP Short Term Goal 2 (Week 2): Patient will demonstrate functional problem solving with Min A multimodal cues. SLP Short Term Goal 2 - Progress (Week 2): Not met SLP Short Term Goal 3 (Week 2): Patient will identify 2 cognitive and 2 physical deficits with Mod verbal cues. SLP Short Term Goal 3 - Progress (Week 2): Met    New Short Term Goals: Week 3: SLP Short Term Goal 1 (Week 3): STGs=LTGs due to ELOS  Weekly Progress Updates: Patient has made functional but inconsistent gains and has met 2 of 3 STGs this reporting period. Currently, patient requires overall Min A verbal and visual cues for orientation. However, patient continues to demonstrate poor recall and carryover of new, functional information. Patient requires overall Mod A verba cues for problem solving but demonstrates increased intellectual awareness of deficits. Patient and family education ongoing. Patient would benefit from continued skilled SLP intervention to maximize his cognitive functioning and overall functional independence prior to discharge.     Intensity: Minumum of 1-2 x/day, 30 to 90 minutes Frequency: 3 to 5 out of 7 days Duration/Length of Stay: 07/18/21 Treatment/Interventions: Cognitive remediation/compensation;Internal/external aids;Therapeutic Activities;Environmental controls;Cueing hierarchy;Functional tasks;Patient/family education   Daily  Session  Skilled Therapeutic Interventions:  Skilled treatment session focused on cognitive goals. SLP facilitated session by providing Min A visual cues for orientation to date and situation, however, patient was independently oriented to time. Patient unable to recall any events from the day but was able to vaguely verbalize the reasoning and use of a slidebaod. Patient was extremely restless in bed and reported constant itching. RN aware. Patient left upright in bed with alarm on and all needs within reach. Continue with current plan of care.      Pain No/Denies Pain   Therapy/Group: Individual Therapy  [name] 07/10/2021, 6:26 AM

## 2021-07-10 NOTE — Plan of Care (Addendum)
I attest that I was present and this session was performed under the supervision of a licensed clinician, and that the documentation accurately reflects treatment performed.  Mariane Masters OTR/L, MSOT  Problem: RH Balance Goal: LTG Patient will maintain dynamic standing with ADLs (OT) Description: LTG:  Patient will maintain dynamic standing balance with assist during activities of daily living (OT)  Outcome: Not Applicable Note: Discontinued d/t slow progress. JLW, OTS   Problem: Sit to Stand Goal: LTG:  Patient will perform sit to stand in prep for activites of daily living with assistance level (OT) Description: LTG:  Patient will perform sit to stand in prep for activites of daily living with assistance level (OT) Flowsheets (Taken 07/10/2021 1646) LTG: PT will perform sit to stand in prep for activites of daily living with assistance level: Moderate Assistance - Patient 50 - 74% Note: Downgraded d/t progress. JLW, OTS.    Problem: RH Bathing Goal: LTG Patient will bathe all body parts with assist levels (OT) Description: LTG: Patient will bathe all body parts with assist levels (OT) Flowsheets (Taken 07/10/2021 1646) LTG: Pt will perform bathing with assistance level/cueing: Minimal Assistance - Patient > 75% Note: Downgraded d/t progress. JLW, OTS.    Problem: RH Dressing Goal: LTG Patient will perform lower body dressing w/assist (OT) Description: LTG: Patient will perform lower body dressing with assist, with/without cues in positioning using equipment (OT) Flowsheets (Taken 07/10/2021 1646) LTG: Pt will perform lower body dressing with assistance level of: Moderate Assistance - Patient 50 - 74% Note: Downgraded d/t progress. JLW, OTS.    Problem: RH Toileting Goal: LTG Patient will perform toileting task (3/3 steps) with assistance level (OT) Description: LTG: Patient will perform toileting task (3/3 steps) with assistance level (OT)  Flowsheets (Taken 07/10/2021  1646) LTG: Pt will perform toileting task (3/3 steps) with assistance level: Moderate Assistance - Patient 50 - 74% Note: Downgraded d/t progress. JLW, OTS.

## 2021-07-10 NOTE — Progress Notes (Signed)
Occupational Therapy Weekly Progress Note  Patient Details  Name: Parker Odonnell. MRN: [id number] Date of Birth: Feb 21, 1932  Beginning of progress report period: July 03, 2021 End of progress report period: July 10, 2021  Today's Date: 07/10/2021 OT Individual Time: 1046-1200 OT Individual Time Calculation (min): 74 min    Patient has met 2 of 4 short term goals.    Deficits in activity tolerance, weakness, poor motor planning fluctuations in performance consistency and cognition limit full participation in BADLs. Poor carryover and recall between tx sessions. Slight progress is indicated by decreased anxiety. Currently requiring MOD - MAX A for sit to stand, MIN A SB transfers, MIN A for bed mobility and MAX A for LB dressing with catheter management. Can complete oral hygiene and grooming at sink level in wc with setup (items placed on L for visual deficits).  Pt is making slow progress with small functional gains overall.   Patient continues to demonstrate the following deficits: muscle weakness, decreased cardiorespiratoy endurance, impaired timing and sequencing, decreased coordination, and decreased motor planning, decreased visual acuity, decreased awareness, decreased problem solving, decreased safety awareness, decreased memory, and delayed processing, and decreased sitting balance, decreased standing balance, decreased postural control, and decreased balance strategies and therefore will continue to benefit from skilled OT intervention to enhance overall performance with BADL and Reduce care partner burden.  Patient not progressing toward long term goals.  See goal revision..  Plan of care revisions: LB ADL goals and toileting downgraded to MOD A.  OT Short Term Goals Week 1:  OT Short Term Goal 1 (Week 1): Patient will perform sit<>stand with max A of 1 and LRAD in preparation for BADL tasks. OT Short Term Goal 1 - Progress (Week 1): Met OT Short Term Goal 2 (Week 1):  Patient will perform 1 step of LB dressing task OT Short Term Goal 2 - Progress (Week 1): Met OT Short Term Goal 3 (Week 1): Pt will don shirt with min A OT Short Term Goal 3 - Progress (Week 1): Met Week 2:  OT Short Term Goal 1 (Week 2): Pt will complete sit > stand with LRAD and MOD A in prep for LB dressing OT Short Term Goal 2 (Week 2): Pt will complete transfer to elevated toilet with MOD A OT Short Term Goal 3 (Week 2): Pt will thread BLE through pants with A only for catheter mgmt OT Short Term Goal 4 (Week 2): Pt will complete donning of socks/shoes with MOD A  Skilled Therapeutic Interventions/Progress Updates:    Pt received sitting in wc, agreeable to OT, and reporting feeling better today. OT session focused on self-care activities and functional transfers for toileting to decrease caregiver burden.   ADL: Skilled interventions include: Completed wc > raised toilet seat using SB with MOD A. Tactile cuing needed for hand placement to redirect from pt grabbing on to OTS during transfer. Pt boosted up from raised toilet seat with BUE for OT assist to pull pants/brief down over hips. Attempted BM without success. Toilet > wc with SB MOD A and VC for lateral scoot, additional tactile cues given to direct hand placement on wc armrest. Directed pt to sink for oral hygiene & grooming activities. Pt performed anterior weight shifts in prep for functional transfer to forward lean to turn sink faucet on to wash hands. Required MOD A directional cuing to notice soap dispenser due to visual deficits R side. Was able to brush teeth with materials on L side  with setup assist. Needed comb placed on L side to visually cue pt to brush hair. Once item placed in visual field, pt able to comb hair. Pt unaware of toothpaste on mouth - required VC to wash face.   Therapeutic activity Pt taken down to gym to focus on practice of transfers for toileting wc to mat. Overall required MIN-MOD A for squat > pivot.  MAX cuing to stop pt from holding on to OT. OT facilitated forward lean for proper body mechanics and movement patterns during transfer. Pt demonstrated increased anxiety with placement of R foot and was hypersensitive to touch.   Noted confusion in pt during session - pt stated that "he just got here last night", and was very surprised that lunch was soon. Demonstrated more confusion today by asking if it was nighttime or daytime. Continuously asked for his "phone" - was handed call bell, and thought it was his phone. Needed redirection to orient to object. Pt left at end of session in wc with exit belt alarm on, call light in reach and all needs met.  Therapy Documentation Precautions:  Precautions Precautions: Fall Precaution Comments: R>L lower extremity weakness, pt is legally blind, has foley catheter Restrictions Weight Bearing Restrictions: No    Therapy/Group: Individual Therapy  Jenn Worischeck 07/10/2021, 6:44 AM

## 2021-07-10 NOTE — Progress Notes (Signed)
Physical Therapy Session Note  Patient Details  Name: Parker Odonnell. MRN: [id number] Date of Birth: 11-13-1931  Today's Date: 07/10/2021 PT Individual Time: 9323-5573 and 1315-1345 PT Individual Time Calculation (min): 55 min and 30 min   Short Term Goals: Week 3:  PT Short Term Goal 1 (Week 3): STGs=LTGs due to ELOS  Skilled Therapeutic Interventions/Progress Updates:     Session 1: Patient lying sideways in the bed upon PT arrival. Patient alert and agreeable to PT session. Patient denied pain during session, reports he slept well and is feeling better this morning. Breakfast try beside patient untouched, patient eager to eat. Patient sat EOB with mod I with use of hospital bed features. Performed sitting balance and postural control x20 min while eating breakfast with distant supervision and intermittent cues for erect posture, scapular retraction and forward weight shift due to intermittent retropulsion with fatigue. Provided divided attention with orientation and d/c questioning while patient ate with coughing x1 leading to cessation of divided attention task. Patient oriented to self, location, partially to situation ("my leg"), date and day of the week, but reports that it is evening time, despite eating breakfast and daylight outside with blinds open. Reoriented patient to time.   Noted increased pitting edema of R anteriolateral ankle and B feet, RN made aware and TED hose applied with total A. Donned B tennis shoe with total A for energy/time management.   Focused remainder of session on transfer training: Patient performed a slide board transfer w/c>bed with supervision and mod A for board placement. Provided cues for hand placement, board placement, and head-hips relationship for proper technique and decreased assist with transfers.  Patient performed blocked practice sit to/from stand x5 progressing from mod A to min A with PT blocking R knee and multimodal cues for forward  weight shift and R knee/hip/trunk elongation. Stood 20-30 seconds each trial with min A progressing to CGA, knee block maintained for safety.  Patient in w/c in the room at end of session with breaks locked, seat belt alarm set, and all needs within reach.   Session 2: Patient in w/c in the room upon PT arrival. Patient denied pain during session.  Patient reluctant to participate in PT session initially, however, agreeable to session outside. Focused on w/c mobility and lower extremity stretching outside [facility] entrance.   Therapeutic Activity: Bed Mobility: Patient performed sit to supine with supervision with min use of bed rail. Transfers: Patient slide board transfers as above.  Wheelchair Mobility:  Patient propelled wheelchair >100 feet and >50 feet over unlevel sidewalk with supervision and min A on downhill due to poor motor planning to control w/c. Provided verbal cues for turning technique and safety.  Therapeutic Exercise: Patient performed the following exercises with verbal and tactile cues for proper technique. B heel cord and hamstring stretch 3x1 min B seated IR/ER hip stretch 2 x1 min B LAQ 2x10 Seated clamshells 2x10  Patient in bed at end of session with breaks locked, bed alarm set, and all needs within reach.   Therapy Documentation Precautions:  Precautions Precautions: Fall Precaution Comments: R>L lower extremity weakness, pt is legally blind, has foley catheter Restrictions Weight Bearing Restrictions: No   Therapy/Group: Individual Therapy  Anvika Gashi L Aimee Timmons PT, DPT  07/10/2021, 4:03 PM

## 2021-07-10 NOTE — Progress Notes (Signed)
PROGRESS NOTE   Subjective/Complaints: Pt up at eob eating with PT present. No new issues. Reports that pain is still controlled RLE.   ROS: Limited due to cognitive/behavioral .    Objective:   No results found. No results for input(s): WBC, HGB, HCT, PLT in the last 72 hours.   No results for input(s): NA, K, CL, CO2, GLUCOSE, BUN, CREATININE, CALCIUM in the last 72 hours.   Intake/Output Summary (Last 24 hours) at 07/10/2021 0837 Last data filed at 07/10/2021 0600 Gross per 24 hour  Intake 180 ml  Output 1450 ml  Net -1270 ml         Physical Exam: Vital Signs Blood pressure 140/72, pulse (!) 52, temperature 98 F (36.7 C), resp. rate 19, height 5\' 8"  (1.727 m), weight 63.6 kg, SpO2 98 %. General: No acute distress HEENT: NCAT, EOMI, oral membranes moist Cards: reg rate  Chest: normal effort Abdomen: Soft, NT, ND Skin: dry, intact Extremities: no edema Psych: pleasant and appropriate  Skin: healing abrasions both legs Musc: No edema in extremities.  No tenderness in extremities. Neuro: Alert, fair insight and awareness. Still with STM deficits Motor: Bilateral UE 5/5.  LLE 3+/5 prox to 4/5 distally.  RLE 2+ to 3-/5 prox to distal    Assessment/Plan: 1. Functional deficits which require 3+ hours per day of interdisciplinary therapy in a comprehensive inpatient rehab setting. Physiatrist is providing close team supervision and 24 hour management of active medical problems listed below. Physiatrist and rehab team continue to assess barriers to discharge/monitor patient progress toward functional and medical goals  Care Tool:  Bathing    Body parts bathed by patient: Right arm, Left arm, Chest, Abdomen, Front perineal area, Right upper leg, Left upper leg, Face   Body parts bathed by helper: Right lower leg, Left lower leg, Buttocks     Bathing assist Assist Level: Moderate Assistance - Patient 50 -  74%     Upper Body Dressing/Undressing Upper body dressing   What is the patient wearing?: Pull over shirt    Upper body assist Assist Level: Supervision/Verbal cueing    Lower Body Dressing/Undressing Lower body dressing      What is the patient wearing?: Pants, Incontinence brief     Lower body assist Assist for lower body dressing: Maximal Assistance - Patient 25 - 49%     Toileting Toileting    Toileting assist Assist for toileting: Maximal Assistance - Patient 25 - 49%     Transfers Chair/bed transfer  Transfers assist     Chair/bed transfer assist level: Supervision/Verbal cueing Chair/bed transfer assistive device: Sliding board   Locomotion Ambulation   Ambulation assist      Assist level: 2 helpers Assistive device: Walker-rolling Max distance: 5 ft   Walk 10 feet activity   Assist  Walk 10 feet activity did not occur: Safety/medical concerns        Walk 50 feet activity   Assist Walk 50 feet with 2 turns activity did not occur: Safety/medical concerns         Walk 150 feet activity   Assist Walk 150 feet activity did not occur: Safety/medical concerns  Walk 10 feet on uneven surface  activity   Assist Walk 10 feet on uneven surfaces activity did not occur: Safety/medical concerns         Wheelchair     Assist Is the patient using a wheelchair?: Yes   Wheelchair activity did not occur: Safety/medical concerns  Wheelchair assist level: Supervision/Verbal cueing Max wheelchair distance: >150 ft    Wheelchair 50 feet with 2 turns activity    Assist    Wheelchair 50 feet with 2 turns activity did not occur: Safety/medical concerns   Assist Level: Supervision/Verbal cueing   Wheelchair 150 feet activity     Assist  Wheelchair 150 feet activity did not occur: Safety/medical concerns   Assist Level: Supervision/Verbal cueing   Blood pressure 140/72, pulse (!) 52, temperature 98 F (36.7  C), resp. rate 19, height 5\' 8"  (1.727 m), weight 63.6 kg, SpO2 98 %.  Medical Problem List and Plan: 1.  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  IV acyclovir completed today 06/25/2021  -Continue CIR therapies including PT, OT, and SLP   -adjusted goals to w/c level 2.  Antithrombotics: -DVT/anticoagulation: [employer]: Other (comment)             -antiplatelet therapy: N/A 3. Neuropathic pain R>L LE: -Lyrica    75mg  TID -continue tramadol as needed -lidoderm patch             9/22 tolerating reduced lyrica. Decrease to 25mg  bid 4. Mood: Provide emotional support             -antipsychotic agents: N/A 5. Neuropsych: This patient is capable of making decisions on his own behalf. 6. Skin/Wound Care: Routine skin checks 7. Fluids/Electrolytes/Nutrition: I personally reviewed all of the patient's labs today, and lab work is within normal limits.              encouraging po 8.  Viral leg rash/?vasculitis.  All lesions fully crusted.  Isolation discontinued  -skin bx --perivascular dermatitis  -pain better  -cbc stable 9.  Hyponatremia.                    9/16 Na+ up to 140 10.  Persistent atrial fibrillation.  Cardiac rate controlled.  Continue Coreg 6.25 mg twice daily/Eliquis             Heart rate almost too slow 9/22---continue same meds 11.  Diastolic congestive heart failure.  Ejection fraction 35 to 40% echocardiogram 2020.   Filed Weights   07/08/21 1144 07/09/21 0500 07/10/21 0500  Weight: 63.5 kg 63.5 kg 63.6 kg    Stable on 9/22 12.  Hypertension. Stopped Norvasc. Added clonidine patch right lower extremity for HTN and neuropathic pain  Controlled on 9/22 13.  BPH/mild hydronephrosis.  Continue Flomax 0.4 mg daily. Maintain Foley tube until follow-up outpatient urology.  Patient failed voiding trial 06/21/2021 14.  Hyperlipidemia: Lipitor 15.  L1 superior endplate compression fracture/mild edema with spinous process T4-T5, sacral insufficiency fracture.  Supportive  care 16.  5 mm right lower lobe pulmonary nodule versus intrapulmonary lymph node.  Repeat noncontrast CT chest 12 months 17. Hyperkalemia: kayexalate lokelma give so far Potassium 4.8 on 9/16, 4.1 9/19  LOS: 15 days A FACE TO [place] T Tasia Liz 07/10/2021, 8:37 AM

## 2021-07-11 ENCOUNTER — Inpatient Hospital Stay (HOSPITAL_COMMUNITY): Payer: Medicare Other

## 2021-07-11 DIAGNOSIS — R42 Dizziness and giddiness: Secondary | ICD-10-CM

## 2021-07-11 DIAGNOSIS — K5901 Slow transit constipation: Secondary | ICD-10-CM

## 2021-07-11 LAB — BASIC METABOLIC PANEL
Anion gap: 7 (ref 5–15)
BUN: 16 mg/dL (ref 8–23)
CO2: 23 mmol/L (ref 22–32)
Calcium: 8.9 mg/dL (ref 8.9–10.3)
Chloride: 103 mmol/L (ref 98–111)
Creatinine, Ser: 0.94 mg/dL (ref 0.61–1.24)
GFR, Estimated: >60 mL/min (ref >60)
Glucose, Bld: 145 mg/dL — ABNORMAL HIGH (ref 70–99)
Potassium: 4 mmol/L (ref 3.5–5.1)
Sodium: 133 mmol/L — ABNORMAL LOW (ref 135–145)

## 2021-07-11 LAB — CBC
HCT: 31.2 % — ABNORMAL LOW (ref 39.0–52.0)
Hemoglobin: 10.5 g/dL — ABNORMAL LOW (ref 13.0–17.0)
MCH: 34.2 pg — ABNORMAL HIGH (ref 26.0–34.0)
MCHC: 33.7 g/dL (ref 30.0–36.0)
MCV: 101.6 fL — ABNORMAL HIGH (ref 80.0–100.0)
Platelets: 131 10*3/uL — ABNORMAL LOW (ref 150–400)
RBC: 3.07 MIL/uL — ABNORMAL LOW (ref 4.22–5.81)
RDW: 14.6 % (ref 11.5–15.5)
WBC: 5.4 10*3/uL (ref 4.0–10.5)
nRBC: 0 % (ref 0.0–0.2)

## 2021-07-11 MED ORDER — GADOBUTROL 1 MMOL/ML IV SOLN
6.0000 mL | Freq: Once | INTRAVENOUS | Status: AC | PRN
  Administered 2021-07-11: 6 mL via INTRAVENOUS

## 2021-07-11 MED ORDER — SODIUM CHLORIDE 0.9 % NICU IV INFUSION SIMPLE
INJECTION | INTRAVENOUS | Status: DC
  Filled 2021-07-11 (×2): qty 500

## 2021-07-11 MED ORDER — SENNOSIDES-DOCUSATE SODIUM 8.6-50 MG PO TABS
2.0000 | ORAL_TABLET | Freq: Every day | ORAL | Status: DC
  Administered 2021-07-11 – 2021-07-17 (×7): 2 via ORAL
  Filled 2021-07-11 (×7): qty 2

## 2021-07-11 MED ORDER — SODIUM CHLORIDE 0.9 % IV SOLN: INTRAVENOUS | Status: DC

## 2021-07-11 MED ORDER — FLEET ENEMA 7-19 GM/118ML RE ENEM
1.0000 | ENEMA | Freq: Once | RECTAL | Status: AC
  Administered 2021-07-11: 1 via RECTAL
  Filled 2021-07-11: qty 1

## 2021-07-11 MED ORDER — SORBITOL 70 % SOLN: 60.0000 mL | Freq: Every day | Status: DC | PRN

## 2021-07-11 MED ORDER — POLYETHYLENE GLYCOL 3350 17 G PO PACK
17.0000 g | PACK | Freq: Every day | ORAL | Status: DC
  Administered 2021-07-11 – 2021-07-18 (×6): 17 g via ORAL
  Filled 2021-07-11 (×6): qty 1

## 2021-07-11 MED ORDER — MECLIZINE HCL 12.5 MG PO TABS
12.5000 mg | ORAL_TABLET | Freq: Three times a day (TID) | ORAL | Status: DC
  Administered 2021-07-11 – 2021-07-12 (×3): 12.5 mg via ORAL
  Filled 2021-07-11 (×4): qty 1

## 2021-07-11 NOTE — Progress Notes (Signed)
Occupational Therapy Session Note  Patient Details  Name: Parker Odonnell. MRN: [id number] Date of Birth: 06-16-1932  Today's Date: 07/11/2021 OT Individual Time: 1000-1045 OT Individual Time Calculation (min): 45 min     Short Term Goals: Week 1:  OT Short Term Goal 1 (Week 1): Patient will perform sit<>stand with max A of 1 and LRAD in preparation for BADL tasks. OT Short Term Goal 1 - Progress (Week 1): Met OT Short Term Goal 2 (Week 1): Patient will perform 1 step of LB dressing task OT Short Term Goal 2 - Progress (Week 1): Met OT Short Term Goal 3 (Week 1): Pt will don shirt with min A OT Short Term Goal 3 - Progress (Week 1): Met Week 2:  OT Short Term Goal 1 (Week 2): Pt will complete sit > stand with LRAD and MOD A in prep for LB dressing OT Short Term Goal 2 (Week 2): Pt will complete transfer to elevated toilet with MOD A OT Short Term Goal 3 (Week 2): Pt will thread BLE through pants with A only for catheter mgmt OT Short Term Goal 4 (Week 2): Pt will complete donning of socks/shoes with MOD A  Skilled Therapeutic Interventions/Progress Updates:  Pt received in bed with RN present and c/o constipation. OTS assisted RN with positioning pt during enema. OT/OTS facilitated re-positioning on BSC to encourage BM. OT facilitated coaching through process and bed mobility to EOB to use stedy > BSC. Pt attempted BM for several minutes and verbalized discomfort d/t constipation. Pt transferred BSC > stedy > EOB where he c/o of feeling "sick". Emesis bag provided while in bed - pt experienced disorientation, nausea and vomiting. Nystagmus noted when pt turned head to R side, lasting for ~30-60 seconds. OT facilitated positioning over bedpan to attempt BM again.  OT spoke to daughter regarding POC and d/c recommendations. Daughter agrees that SNF may be appropriate for d/c due to pt decline.Pt left in bed on bedpan with daughter present. RN/MD made aware of pt's status, new nystagmus and  vomiting.     15 minutes of skilled OT time missed d/t nausea. Will attempt to make up time when able.   Therapy Documentation Precautions:  Precautions Precautions: Fall Precaution Comments: R>L lower extremity weakness, pt is legally blind, has foley catheter Restrictions Weight Bearing Restrictions: No    Therapy/Group: Individual Therapy  Darrie Macmillan 07/11/2021, 6:55 AM

## 2021-07-11 NOTE — Progress Notes (Signed)
Pt c/o episodes of dizziness , v/s : BP-151/64, P-70. R-19., Pulse ox -98% RA. Pt is  trying to move his bowels, c/o constipation. Pt was seen by PA. Miralax ordered daily, Fleet enema x 1 .

## 2021-07-11 NOTE — Progress Notes (Signed)
SLP Cancellation Note  Patient Details Name: Parker Odonnell. MRN: [id number] DOB: Oct 12, 1932   Cancelled treatment:       Patient missed 30 minutes of skilled SLP intervention due to reports of not feeling well. Patient reporting stomach pain and constipation. RN aware. Will re-attempt as able. Continue with current plan of care.                                                                                                 Nthony Lefferts 07/11/2021, 8:52 AM

## 2021-07-11 NOTE — Consult Note (Addendum)
Stroke Neurology Consultation Note  Consult Requested by: Dr. Naaman Plummer  Reason for Consult: vertigo and nystagmus  Consult Date: 07/11/21   The history was obtained from the Dr. Naaman Plummer.  Pt is poor historian and likely with dementia, not able to provide meaningful history  History of Present Illness:  Parker [place]ingston. is a 87 y.o. Caucasian male with PMH of CAD, legally blind, diastolic CHF, HLD, AF on Eliquis, s/p left hip replacement admitted on 06/10/2021 with falls. Found to have b/l leg R>L weakness and right LE rash started on valacyclovir for suspected shingles.  MRI of the brain unremarkable for acute intracranial, but there was a 9 mm nodular lesion along the left lateral aspect of the medulla, indeterminate.  No associated mass-effect.  MRA of head and neck negative.  MRI thoracic lumbar spine no spinal cord compression. CT of the chest abdomen pelvis showed no mass or adenopathy.  There was a 5 mm right lower lobe pulmonary nodule recommend CT scan in 12 months.  LP CSF showed WBC 83, RBC 1, protein 75 and glucose 55.  Neurology as well as ID consulted.  underwent extensive work up included infectious, vasculitis, paraneoplastic work-up unremarkable except CSF VZV PCR positive. Had acyclovir 14 day course and stopped on 06/25/2021 when he was admitted to [facility].  so far he has been stable and working with therapist still has some right leg weakness. Today, he reported vertigo episode to Dr. Naaman Plummer and he was found to have nystagmus. No ataxia or speech changes noted. Given his neuro history of recent VZV meningitis and AF on eliquis, neurology was consulted.  On my meeting with pt, he is lying in bed, comfortably, seems to have cognitive impairment and not orientated. Can not provide meaningful history. He did not remember the vertigo and can not tell how long it lasted but stated that he did not feel any dizziness at this time, no HA. He has no nystagmus at this time.    Past Medical History:   Diagnosis Date   Arthritis    ASCVD (arteriosclerotic cardiovascular disease)    single vessel   Cancer (HCC)    CHF (congestive heart failure) ([place])    Coronary atherosclerosis of native coronary artery    DNR (do not resuscitate) 06/10/2021   Dyslipidemia    ED (erectile dysfunction)    GERD (gastroesophageal reflux disease)    hx of   History of kidney stones    Hypertension    Inguinal hernia    Left bundle branch block    chronic   Postsurgical percutaneous transluminal coronary angioplasty status    Shingles     Past Surgical History:  Procedure Laterality Date   [place] TEST  05/25/12   [place]   INGUINAL HERNIA REPAIR  06/15/2012   Procedure: HERNIA REPAIR INGUINAL ADULT;  Surgeon: Adin Hector, MD;  Location: [facility];  Service: General;  Laterality: Bilateral;  Bilateral Inguinal Hernia Repair with Ultrapro Mesh   MOHS SURGERY     RIGHT/LEFT HEART CATH AND CORONARY ANGIOGRAPHY N/A 11/13/2019   Procedure: RIGHT/LEFT HEART CATH AND CORONARY ANGIOGRAPHY;  Surgeon: Jettie Booze, MD;  Location: [facility];  Service: Cardiovascular;  Laterality: N/A;   TOTAL HIP ARTHROPLASTY Left 01/10/2020   Procedure: TOTAL HIP ARTHROPLASTY ANTERIOR APPROACH;  Surgeon: Gaynelle Arabian, MD;  Location: [facility];  Service: Orthopedics;  Laterality: Left;  177min  Family History  Problem Relation Age of Onset   Arthritis Father    Hypertension Paternal Uncle    Heart attack Neg Hx    Stroke Neg Hx     Social History:  reports that he has never smoked. He has never used smokeless tobacco. He reports that he does not drink alcohol and does not use drugs.  Allergies: No Known Allergies  No current facility-administered medications on file prior to encounter.   Current Outpatient Medications on File Prior to Encounter  Medication Sig Dispense Refill   acetaminophen (TYLENOL) 325  MG tablet Take 2 tablets (650 mg total) by mouth every 6 (six) hours as needed for mild pain (or Fever >/= 101).     acyclovir 660 mg in dextrose 5 % 100 mL Inject 660 mg into the vein every 12 (twelve) hours. LAST dose todAY AT 8:00 pm     amLODipine (NORVASC) 10 MG tablet Take 1 tablet (10 mg total) by mouth daily.     atorvastatin (LIPITOR) 40 MG tablet TAKE ONE TABLET BY MOUTH EVERYDAY AT BEDTIME 90 tablet 3   b complex vitamins tablet Take 1 tablet by mouth daily.     carvedilol (COREG) 6.25 MG tablet Take 1 tablet (6.25 mg total) by mouth 2 (two) times daily with a meal.     ELIQUIS 2.5 MG TABS tablet TAKE ONE TABLET BY MOUTH EVERY MORNING and TAKE ONE TABLET BY MOUTH EVERYDAY AT BEDTIME 60 tablet 5   feeding supplement (ENSURE ENLIVE / ENSURE PLUS) LIQD Take 237 mLs by mouth 3 (three) times daily between meals. 237 mL 12   methocarbamol (ROBAXIN) 500 MG tablet Take 1 tablet (500 mg total) by mouth every 6 (six) hours as needed for muscle spasms. (Patient not taking: No sig reported) 40 tablet 0   Multiple Vitamin (MULTIVITAMIN WITH MINERALS) TABS tablet Take 1 tablet by mouth every other day. [facility]  Spectravite Multivitamin     Multiple Vitamins-Minerals (OCUVITE-LUTEIN PO) Take 1 capsule by mouth daily.     Polyethyl Glycol-Propyl Glycol 0.4-0.3 % SOLN Apply 1 drop to eye 2 (two) times daily as needed (dry eyes).      pregabalin (LYRICA) 50 MG capsule Take 1 capsule (50 mg total) by mouth 2 (two) times daily.     tamsulosin (FLOMAX) 0.4 MG CAPS capsule Take 1 capsule (0.4 mg total) by mouth daily. 30 capsule    traMADol (ULTRAM) 50 MG tablet Take 1 tablet (50 mg total) by mouth every 12 (twelve) hours as needed for moderate pain. 30 tablet     Review of Systems: A full ROS was attempted today and was not able to be performed given his cognitive impairment  Physical Examination: Temp:  [97.6 F (36.4 C)-98.4 F (36.9 C)] 97.6 F (36.4 C) (09/23 1420) Pulse Rate:  [62-70] 62 (09/23  1420) Resp:  [16-19] 16 (09/23 1420) BP: (125-163)/(54-80) 163/76 (09/23 1420) SpO2:  [94 %-98 %] 98 % (09/23 0551) Weight:  [62.9 kg] 62.9 kg (09/23 0442)  General - well nourished, well developed, in no apparent distress.    Ophthalmologic - fundi not visualized due to noncooperation.    Cardiovascular - irregularly irregular heart rate and rhythm.  Mental Status -  Awake, alert, only orientated to city but not place, told me his age 36, cannot tell me the time and perseverated on 25. Language exam showed paucity of speech, intermittent perseveration and word finding difficulty, however, follows simple commands, able to name and repeat, mild dysarthria.  Cranial Nerves II - XII - II - limited central vision only, able to see hand waving and inconsistent with finger counting III, IV, VI - Extraocular movements intact. V - Facial sensation intact bilaterally. VII - mild right nasolabial fold flattening VIII - Hearing & vestibular intact bilaterally. X - Palate elevates symmetrically. XI - Chin turning & shoulder shrug intact bilaterally. XII - Tongue protrusion intact.  Motor Strength - The patient's strength was symmetrical 4/5 in upper extremities without pronator drift, left LE 3+/5 proximal and 4/5 distal. RLE proximal 2/5, knee flexion 3/5 and distal ankle DF/PF 4/5.   Motor Tone & Bulk - Muscle tone was assessed at the neck and appendages and was normal.  Bulk was normal and fasciculations were absent.   Reflexes - The patient's reflexes were 1+ in all extremities and he had no pathological reflexes.  Sensory - Light touch, temperature/pinprick were assessed and were symmetrical.    Coordination - The patient had normal movements in the hands slow but with no ataxia or dysmetria.  Not able to cooperate with heel-to-shin exam bilaterally.  Tremor was absent.  Gait and Station - deferred  Data Reviewed: MR ANGIO HEAD WO CONTRAST  Result Date: 06/13/2021 CLINICAL DATA:   Initial evaluation for CNS vasculitis, bilateral lower extremity weakness. EXAM: MRA HEAD WITHOUT CONTRAST TECHNIQUE: Angiographic images of the Circle of Willis were acquired using MRA technique without intravenous contrast. COMPARISON:  None available. FINDINGS: Anterior circulation: Examination mildly degraded by motion artifact. Visualized distal cervical segments of the internal carotid arteries are patent with antegrade flow. Petrous, cavernous, and supraclinoid segments patent without stenosis or other abnormality. A1 segments patent bilaterally. Possible short-segment fenestration at the left aspect of the anterior communicating artery complex noted. Anterior communicating artery complex otherwise unremarkable. Both anterior cerebral arteries patent to their distal aspects without stenosis. No M1 stenosis or occlusion. Normal MCA bifurcations. Distal MCA branches perfused and grossly symmetric. Posterior circulation: Left vertebral artery strongly dominant and widely patent to the vertebrobasilar junction. Left PICA patent. Hypoplastic right vertebral artery largely terminates in PICA. Basilar patent to its distal aspect without stenosis. Superior cerebellar arteries patent bilaterally. Both PCAs primarily supplied via the basilar well perfused to their distal aspects. Anatomic variants: Hypoplastic right vertebral artery terminates in PICA. Other: No intracranial aneurysm. No significant vascular irregularity or beading to suggest CNS vasculitis, although the distal small vessels are somewhat limited assessment due to motion artifact. IMPRESSION: Normal intracranial MRA. No MRI evidence for CNS vasculitis. Electronically Signed   By: Jeannine Boga M.D.   On: 06/13/2021 02:36   MR BRAIN W WO CONTRAST  Result Date: 06/13/2021 CLINICAL DATA:  Initial evaluation for right worse than left lower extremity weakness over 3 weeks. Evaluate for CNS vasculitis versus paraneoplastic disease versus infection.  EXAM: MRI HEAD WITHOUT AND WITH CONTRAST TECHNIQUE: Multiplanar, multiecho pulse sequences of the brain and surrounding structures were obtained without and with intravenous contrast. CONTRAST:  6.85mL GADAVIST GADOBUTROL 1 MMOL/ML IV SOLN COMPARISON:  None available. FINDINGS: Brain: Mild diffuse prominence of the CSF containing spaces compatible generalized age-related cerebral atrophy. No significant cerebral white matter disease for age. No other focal parenchymal signal abnormality to suggest acute encephalitis, including limbic encephalitis or other paraneoplastic process. No abnormal foci of restricted of restricted diffusion to suggest acute or subacute ischemia. Gray-white matter differentiation maintained. No encephalomalacia to suggest chronic cortical infarction. No foci of susceptibility artifact to suggest acute or chronic intracranial hemorrhage. No made of a 9 mm  nodular lesion along the left lateral aspect of the medulla, immediately adjacent to the foramen of Luschka (series 9, image 4). This lesion demonstrates no appreciable enhancement following contrast administration. No PICA aneurysm seen on corresponding MRA. 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 No associated mass effect. No other mass lesion, midline shift or mass effect. Mild ventricular prominence related to global parenchymal volume loss without hydrocephalus. No extra-axial fluid collection. Pituitary gland and suprasellar region within normal limits. Midline structures intact and normal. No abnormal enhancement. No findings to suggest meningitis or other CNS infection. Vascular: Major intracranial vascular flow voids are maintained. Skull and upper cervical spine: Degenerative thickening noted about the tectorial membrane without significant stenosis. Craniocervical junction otherwise unremarkable. Bone marrow signal intensity within normal limits. No focal marrow replacing lesion. No scalp soft tissue  abnormality. Sinuses/Orbits: Globes and orbital soft tissues within normal limits. Mild scattered mucosal thickening noted within the ethmoidal air cells. Paranasal sinuses are otherwise clear. Small right greater than left mastoid effusions noted. Visualized nasopharynx unremarkable. Inner ear structures grossly within normal limits. Other: None. IMPRESSION: 1. No acute intracranial abnormality. Specifically, no MRI evidence for encephalitis, paraneoplastic disease, or intracranial infection. 2. 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 No associated mass effect. This is felt to be incidental in nature and of doubtful significance. 3. Otherwise normal brain MRI for age. 4. Small right greater than left mastoid effusions. Electronically Signed   By: Jeannine Boga M.D.   On: 06/13/2021 02:30   MR THORACIC SPINE W WO CONTRAST  Result Date: 06/13/2021 CLINICAL DATA:  Initial evaluation for myelopathy, acute or progressive. EXAM: MRI THORACIC AND LUMBAR SPINE WITHOUT AND WITH CONTRAST TECHNIQUE: Multiplanar and multiecho pulse sequences of the thoracic and lumbar spine were obtained without and with intravenous contrast. CONTRAST:  6.50mL GADAVIST GADOBUTROL 1 MMOL/ML IV SOLN COMPARISON:  None available. FINDINGS: MRI THORACIC SPINE FINDINGS Alignment: Mild dextroscoliosis. Alignment otherwise normal with preservation of the normal thoracic kyphosis. No listhesis. Vertebrae: Vertebral body height maintained without acute or chronic fracture. Bone marrow signal intensity diffusely heterogeneous without worrisome osseous lesion. Mild edema noted within the spinous processes of T4 and T5, of uncertain etiology or significance (series 1, images 7, 6). No visible discrete osseous lesion, fracture, or other abnormality evident by MRI. The surrounding soft tissues are within normal limits. Mild edema and enhancement noted within the  inferior endplate of V40, favored discogenic and reactive in nature. No other abnormal marrow edema or enhancement within the thoracic spine. Cord: Normal signal and morphology. No abnormal enhancement. No epidural collections. Paraspinal and other soft tissues: Visualized paraspinous soft tissues demonstrate no acute finding. Small layering bilateral pleural effusions noted, left slightly larger than right. 5.2 cm exophytic cyst noted extending from the left kidney. Disc levels: T4-5: Small central disc protrusion indents the ventral thecal sac (series 22, image 14). Posterior element hypertrophy. No significant stenosis. T6-7: Small central disc protrusion indents the ventral thecal sac (series 22, image 20). Mild flattening of the ventral cord without cord signal changes. Left-sided facet hypertrophy. No spinal stenosis. T7-8: Small central disc protrusion indents the ventral thecal sac (series 22, image 23). Mild flattening of the ventral cord without cord signal changes. Mild posterior element hypertrophy. No significant stenosis. T12-L1: Diffuse disc bulge with bilateral facet hypertrophy. No significant spinal stenosis. Mild bilateral foraminal narrowing. No other significant disc pathology within the thoracic spine for age. Multilevel  facet hypertrophy noted throughout the mid and lower thoracic spine. No significant spinal stenosis. MRI LUMBAR SPINE FINDINGS Segmentation:  Examination degraded by motion artifact. Standard segmentation. Lowest well-formed disc space labeled the L5-S1 level. Alignment: 3 mm anterolisthesis of L3 on L4, with trace retrolisthesis of L4 on L5. Findings chronic and facet mediated. Underlying levoscoliosis. Vertebrae: Acute compression fracture seen involving the superior endplate of L1. Associated height loss measures up to 35% without significant bony retropulsion. Additionally, there is a suspected left sacral fracture, seen only on sagittal projection (series 2, image 14).  Otherwise, vertebral body height maintained with no other acute or chronic fracture. Visualized bone marrow signal intensity diffusely heterogeneous without discrete worrisome osseous lesion. No other abnormal marrow edema or enhancement. Conus medullaris: Extends to the T12-L1 level and appears normal. No visible abnormal enhancement on this motion degraded exam. Paraspinal and other soft tissues: Paraspinous soft tissues demonstrate no acute finding. Prominent distension of the partially visualized urinary bladder noted. Disc levels: L1-2: Disc bulge with disc desiccation and intervertebral disc space narrowing. Mild to moderate bilateral facet hypertrophy. Resultant moderate spinal stenosis, with mild to moderate bilateral L1 foraminal narrowing. L2-3: Mild disc bulge. Moderate bilateral facet hypertrophy. No significant spinal stenosis. Foramina remain patent. L3-4: Anterolisthesis. Degenerative intervertebral disc space narrowing with diffuse disc bulge and disc desiccation. Disc bulging eccentric to the right. Moderate bilateral facet hypertrophy. Resultant moderate spinal stenosis. Mild to moderate right with mild left L3 foraminal narrowing. L4-5: Disc bulge with disc desiccation. Moderate bilateral facet hypertrophy. Mild narrowing of the lateral recesses bilaterally. Central canal remains patent. Mild to moderate left L4 foraminal narrowing. Right neural foramen remains patent. L5-S1: Degenerative intervertebral disc space narrowing with disc desiccation and diffuse disc bulge. Reactive endplate spurring. Mild left greater than right facet hypertrophy. No significant spinal stenosis. Foramina remain patent. IMPRESSION: 1. Acute compression fracture involving the superior endplate of L1 with up to 35% height loss without significant bony retropulsion. 2. Additional probable acute fracture of the left sacrum, partially visualized. Follow-up with dedicated CT and/or MRI of the sacrum could be performed for  further evaluation as warranted. 3. Mild edema within the spinous processes of T4 and T5, of uncertain etiology, but could reflect additional subtle fractures given the above findings. Correlation with physical exam for possible pain at this location recommended. 4. Normal MRI appearance of the thoracic spinal cord, conus medullaris, and cauda equina. No cord signal changes to suggest myelopathy. No abnormal enhancement. 5. Moderate lumbar spondylosis with resultant moderate spinal stenosis at L1-2 and L3-4. Additional more mild spondylosis elsewhere within the thoracolumbar spine as above. No other significant stenosis or neural impingement. 6. Prominent distension of the urinary bladder. Clinical correlation for possible urinary retention recommended. Electronically Signed   By: Jeannine Boga M.D.   On: 06/13/2021 03:17   MR Lumbar Spine W Wo Contrast  Result Date: 06/13/2021 CLINICAL DATA:  Initial evaluation for myelopathy, acute or progressive. EXAM: MRI THORACIC AND LUMBAR SPINE WITHOUT AND WITH CONTRAST TECHNIQUE: Multiplanar and multiecho pulse sequences of the thoracic and lumbar spine were obtained without and with intravenous contrast. CONTRAST:  6.86mL GADAVIST GADOBUTROL 1 MMOL/ML IV SOLN COMPARISON:  None available. FINDINGS: MRI THORACIC SPINE FINDINGS Alignment: Mild dextroscoliosis. Alignment otherwise normal with preservation of the normal thoracic kyphosis. No listhesis. Vertebrae: Vertebral body height maintained without acute or chronic fracture. Bone marrow signal intensity diffusely heterogeneous without worrisome osseous lesion. Mild edema noted within the spinous processes of T4 and T5, of uncertain  etiology or significance (series 1, images 7, 6). No visible discrete osseous lesion, fracture, or other abnormality evident by MRI. The surrounding soft tissues are within normal limits. Mild edema and enhancement noted within the inferior endplate of F02, favored discogenic and  reactive in nature. No other abnormal marrow edema or enhancement within the thoracic spine. Cord: Normal signal and morphology. No abnormal enhancement. No epidural collections. Paraspinal and other soft tissues: Visualized paraspinous soft tissues demonstrate no acute finding. Small layering bilateral pleural effusions noted, left slightly larger than right. 5.2 cm exophytic cyst noted extending from the left kidney. Disc levels: T4-5: Small central disc protrusion indents the ventral thecal sac (series 22, image 14). Posterior element hypertrophy. No significant stenosis. T6-7: Small central disc protrusion indents the ventral thecal sac (series 22, image 20). Mild flattening of the ventral cord without cord signal changes. Left-sided facet hypertrophy. No spinal stenosis. T7-8: Small central disc protrusion indents the ventral thecal sac (series 22, image 23). Mild flattening of the ventral cord without cord signal changes. Mild posterior element hypertrophy. No significant stenosis. T12-L1: Diffuse disc bulge with bilateral facet hypertrophy. No significant spinal stenosis. Mild bilateral foraminal narrowing. No other significant disc pathology within the thoracic spine for age. Multilevel facet hypertrophy noted throughout the mid and lower thoracic spine. No significant spinal stenosis. MRI LUMBAR SPINE FINDINGS Segmentation:  Examination degraded by motion artifact. Standard segmentation. Lowest well-formed disc space labeled the L5-S1 level. Alignment: 3 mm anterolisthesis of L3 on L4, with trace retrolisthesis of L4 on L5. Findings chronic and facet mediated. Underlying levoscoliosis. Vertebrae: Acute compression fracture seen involving the superior endplate of L1. Associated height loss measures up to 35% without significant bony retropulsion. Additionally, there is a suspected left sacral fracture, seen only on sagittal projection (series 2, image 14). Otherwise, vertebral body height maintained with no  other acute or chronic fracture. Visualized bone marrow signal intensity diffusely heterogeneous without discrete worrisome osseous lesion. No other abnormal marrow edema or enhancement. Conus medullaris: Extends to the T12-L1 level and appears normal. No visible abnormal enhancement on this motion degraded exam. Paraspinal and other soft tissues: Paraspinous soft tissues demonstrate no acute finding. Prominent distension of the partially visualized urinary bladder noted. Disc levels: L1-2: Disc bulge with disc desiccation and intervertebral disc space narrowing. Mild to moderate bilateral facet hypertrophy. Resultant moderate spinal stenosis, with mild to moderate bilateral L1 foraminal narrowing. L2-3: Mild disc bulge. Moderate bilateral facet hypertrophy. No significant spinal stenosis. Foramina remain patent. L3-4: Anterolisthesis. Degenerative intervertebral disc space narrowing with diffuse disc bulge and disc desiccation. Disc bulging eccentric to the right. Moderate bilateral facet hypertrophy. Resultant moderate spinal stenosis. Mild to moderate right with mild left L3 foraminal narrowing. L4-5: Disc bulge with disc desiccation. Moderate bilateral facet hypertrophy. Mild narrowing of the lateral recesses bilaterally. Central canal remains patent. Mild to moderate left L4 foraminal narrowing. Right neural foramen remains patent. L5-S1: Degenerative intervertebral disc space narrowing with disc desiccation and diffuse disc bulge. Reactive endplate spurring. Mild left greater than right facet hypertrophy. No significant spinal stenosis. Foramina remain patent. IMPRESSION: 1. Acute compression fracture involving the superior endplate of L1 with up to 35% height loss without significant bony retropulsion. 2. Additional probable acute fracture of the left sacrum, partially visualized. Follow-up with dedicated CT and/or MRI of the sacrum could be performed for further evaluation as warranted. 3. Mild edema within  the spinous processes of T4 and T5, of uncertain etiology, but could reflect additional subtle fractures given the  above findings. Correlation with physical exam for possible pain at this location recommended. 4. Normal MRI appearance of the thoracic spinal cord, conus medullaris, and cauda equina. No cord signal changes to suggest myelopathy. No abnormal enhancement. 5. Moderate lumbar spondylosis with resultant moderate spinal stenosis at L1-2 and L3-4. Additional more mild spondylosis elsewhere within the thoracolumbar spine as above. No other significant stenosis or neural impingement. 6. Prominent distension of the urinary bladder. Clinical correlation for possible urinary retention recommended. Electronically Signed   By: Jeannine Boga M.D.   On: 06/13/2021 03:17   MR PELVIS WO CONTRAST  Result Date: 06/16/2021 CLINICAL DATA:  Pelvic fracture EXAM: MRI PELVIS WITHOUT CONTRAST TECHNIQUE: Multiplanar multisequence MR imaging of the pelvis was performed. No intravenous contrast was administered. COMPARISON:  CT chest abdomen pelvis, 06/14/2021 FINDINGS: Urinary Tract: Severely distended urinary bladder, measuring at least 19 cm. Bowel:  Unremarkable visualized pelvic bowel loops. Vascular/Lymphatic: No pathologically enlarged lymph nodes. No significant vascular abnormality seen. Reproductive:  Prostatomegaly with median lobe hypertrophy. Other:  None. Musculoskeletal: No suspicious bone lesions identified. Evaluation of the pelvis is significantly limited by left hip total arthroplasty and associated metallic susceptibility artifact, which particularly limits fat saturation and assessment for fracture of the left iliac bone. There is relatively focal bone marrow edema of the left aspect of the S3 segment (series 3, image 22). 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 IMPRESSION: 1. Evaluation of the pelvis is significantly limited by metallic  susceptibility artifact related to left hip arthroplasty, which particularly limits fat saturation and assessment for fracture of the left iliac bone. 2. Within this limitation, there is relatively focal bone marrow edema of the left aspect of the S3 segment, consistent with insufficiency fracture. No other evidence of pelvic fracture or traumatic bone marrow edema. 3. 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 4. Severely distended urinary bladder, measuring at least 19 cm. Correlate for urinary retention. 5. Prostatomegaly with median lobe hypertrophy. Electronically Signed   By: Eddie Candle M.D.   On: 06/16/2021 16:04   CT CHEST ABDOMEN PELVIS W CONTRAST  Result Date: 06/14/2021 CLINICAL DATA:  L1 and sacral fractures, possible paraneoplastic syndrome EXAM: CT CHEST, ABDOMEN, AND PELVIS WITH CONTRAST TECHNIQUE: Multidetector CT imaging of the chest, abdomen and pelvis was performed following the standard protocol during bolus administration of intravenous contrast. CONTRAST:  16mL OMNIPAQUE IOHEXOL 350 MG/ML SOLN COMPARISON:  Thoracolumbar MRI 06/12/2021, and previous FINDINGS: CT CHEST FINDINGS Cardiovascular: Mild cardiomegaly with biatrial enlargement. Scattered coronary calcifications. Aortic Atherosclerosis (ICD10-170.0). Mediastinum/Nodes: No mass or adenopathy. Lungs/Pleura: No pleural effusion. No pneumothorax. Partially calcified pleural-based 4 mm granuloma, anterior right upper lobe. 5 mm angular nodule adjacent to the right major fissure possibly intrapulmonary lymph node but nonspecific. Coarse scarring or atelectasis in the lung bases. Musculoskeletal: Shoulder DJD left worse than right. Probable chronic bilateral rib fracture deformities. No acute fracture or worrisome bone lesion. CT ABDOMEN PELVIS FINDINGS Hepatobiliary: No focal liver abnormality is seen. Status post cholecystectomy. No biliary dilatation. Pancreas: Unremarkable. No pancreatic ductal dilatation or  surrounding inflammatory changes. Spleen: Normal in size without focal abnormality. Adrenals/Urinary Tract: 4.9 cm probable cyst, upper pole left kidney. There is mild bilateral hydronephrosis and ureterectasis without any definite urolithiasis. The urinary bladder is distended above the level of the umbilicus. Stomach/Bowel: Small hiatal hernia. The stomach is incompletely distended. Small bowel decompressed. Appendix not discretely identified. No pericecal inflammatory/edematous changes. Innumerable descending and sigmoid diverticula without significant adjacent inflammatory  change. Vascular/Lymphatic: Scattered calcified aortoiliac plaque without aneurysm. Portal vein patent. No abdominal or pelvic adenopathy localized. Reproductive: Marked prostate enlargement. Other: No ascites.  No free air. Musculoskeletal: L1 superior endplate subacute compression fracture deformity with approximally the 30% loss of height anteriorly, no retropulsion. Spondylitic changes in the lower lumbar spine. Left hip arthroplasty hardware resulting in streak artifact degrading portions of the scan. Right hip DJD. IMPRESSION: 1. No mass or adenopathy. 2. Marked prostate enlargement with distended urinary bladder, which may account for the bilateral hydronephrosis and ureterectasis in the absence of urolithiasis. 3. Descending and sigmoid diverticulosis 4. 5 mm right lower lobe pulmonary nodule. No follow-up needed if patient is low-risk. Non-contrast chest CT can be considered in 12 months if patient is high-risk. This recommendation follows the consensus statement: Guidelines for Management of Incidental Pulmonary Nodules Detected on CT Images: From the Fleischner Society 2017; Radiology 2017; 284:228-243. Electronically Signed   By: Lucrezia Europe M.D.   On: 06/14/2021 13:52   VAS [place] LOWER EXTREMITY VENOUS (DVT)  Result Date: 07/02/2021  Lower Venous DVT Study Patient Name:  Parker Rominger.  Date of Exam:   07/02/2021 Medical Rec #:  [id number]          Accession #:    [id number] Date of Birth: [birth year]         Patient Gender: M Patient Age:   53 years Exam Location:  [facility] Procedure:      VAS [place] LOWER EXTREMITY VENOUS (DVT) Referring Phys: Thelma Comp PATEL --------------------------------------------------------------------------------  Indications: Edema.  Performing Technologist: Archie Patten RVS  Examination Guidelines: A complete evaluation includes B-mode imaging, spectral Doppler, color Doppler, and power Doppler as needed of all accessible portions of each vessel. Bilateral testing is considered an integral part of a complete examination. Limited examinations for reoccurring indications may be performed as noted. The reflux portion of the exam is performed with the patient in reverse Trendelenburg.  +---------+---------------+---------+-----------+----------+--------------+ RIGHT    CompressibilityPhasicitySpontaneityPropertiesThrombus Aging +---------+---------------+---------+-----------+----------+--------------+ CFV      Full           Yes      Yes                                 +---------+---------------+---------+-----------+----------+--------------+ SFJ      Full                                                        +---------+---------------+---------+-----------+----------+--------------+ FV Prox  Full                                                        +---------+---------------+---------+-----------+----------+--------------+ FV Mid   Full                                                        +---------+---------------+---------+-----------+----------+--------------+ FV DistalFull                                                        +---------+---------------+---------+-----------+----------+--------------+  PFV      Full                                                        +---------+---------------+---------+-----------+----------+--------------+ POP       Full           Yes      Yes                                 +---------+---------------+---------+-----------+----------+--------------+ PTV      Full                                                        +---------+---------------+---------+-----------+----------+--------------+ PERO     Full                                                        +---------+---------------+---------+-----------+----------+--------------+   +---------+---------------+---------+-----------+----------+-------------------+ LEFT     CompressibilityPhasicitySpontaneityPropertiesThrombus Aging      +---------+---------------+---------+-----------+----------+-------------------+ CFV      Full           Yes      Yes                                      +---------+---------------+---------+-----------+----------+-------------------+ SFJ      Full                                                             +---------+---------------+---------+-----------+----------+-------------------+ FV Prox  Full                                                             +---------+---------------+---------+-----------+----------+-------------------+ FV Mid   Full                                                             +---------+---------------+---------+-----------+----------+-------------------+ FV DistalFull                                                             +---------+---------------+---------+-----------+----------+-------------------+ PFV      Full                                                             +---------+---------------+---------+-----------+----------+-------------------+  POP      Full           Yes      Yes                                      +---------+---------------+---------+-----------+----------+-------------------+ PTV      Full                                                              +---------+---------------+---------+-----------+----------+-------------------+ PERO                                                  Not well visualized +---------+---------------+---------+-----------+----------+-------------------+     Summary: BILATERAL: - No evidence of deep vein thrombosis seen in the lower extremities, bilaterally. -No evidence of popliteal cyst, bilaterally.   *See table(s) above for measurements and observations. Electronically signed by Monica Martinez MD on 07/02/2021 at 2:56:34 PM.    Final    VAS [place] LOWER EXTREMITY VENOUS (DVT)  Result Date: 06/15/2021  Lower Venous DVT Study Patient Name:  Parker Serano.  Date of Exam:   06/15/2021 Medical Rec #: [id number]          Accession #:    [id number] Date of Birth: [birth year]         Patient Gender: M Patient Age:   64 years Exam Location:  [facility] Procedure:      VAS [place] LOWER EXTREMITY VENOUS (DVT) Referring Phys: Lesleigh Noe --------------------------------------------------------------------------------  Indications: Swelling.  Comparison Study: No prior studies. Performing Technologist: Darlin Coco RDMS, RVT  Examination Guidelines: A complete evaluation includes B-mode imaging, spectral Doppler, color Doppler, and power Doppler as needed of all accessible portions of each vessel. Bilateral testing is considered an integral part of a complete examination. Limited examinations for reoccurring indications may be performed as noted. The reflux portion of the exam is performed with the patient in reverse Trendelenburg.  +---------+---------------+---------+-----------+----------+--------------+ RIGHT    CompressibilityPhasicitySpontaneityPropertiesThrombus Aging +---------+---------------+---------+-----------+----------+--------------+ CFV      Full           Yes      Yes                                 +---------+---------------+---------+-----------+----------+--------------+ SFJ      Full                                                         +---------+---------------+---------+-----------+----------+--------------+ FV Prox  Full                                                        +---------+---------------+---------+-----------+----------+--------------+ FV Mid   Full                                                        +---------+---------------+---------+-----------+----------+--------------+  FV DistalFull                                                        +---------+---------------+---------+-----------+----------+--------------+ PFV      Full                                                        +---------+---------------+---------+-----------+----------+--------------+ POP      Full           Yes      Yes                                 +---------+---------------+---------+-----------+----------+--------------+ PTV      Full                                                        +---------+---------------+---------+-----------+----------+--------------+ PERO     Full                                                        +---------+---------------+---------+-----------+----------+--------------+   +----+---------------+---------+-----------+----------+--------------+ LEFTCompressibilityPhasicitySpontaneityPropertiesThrombus Aging +----+---------------+---------+-----------+----------+--------------+ CFV Full           Yes      Yes                                 +----+---------------+---------+-----------+----------+--------------+     Summary: RIGHT: - There is no evidence of deep vein thrombosis in the lower extremity.  - No cystic structure found in the popliteal fossa.  LEFT: - No evidence of common femoral vein obstruction.  *See table(s) above for measurements and observations. Electronically signed by Monica Martinez MD on 06/15/2021 at 2:27:00 PM.    Final      Ref. Range 06/11/2021 14:06  Anti Nuclear Antibody  (ANA) Latest Ref Range: Negative  Negative  ds DNA Ab Latest Ref Range: 0 - 9 IU/mL <1  RA Latex Turbid. Latest Ref Range: <14.0 IU/mL <10.0  Cytoplasmic (C-ANCA) Latest Ref Range: Neg:<1:20 titer <1:20  P-ANCA Latest Ref Range: Neg:<1:20 titer <1:20  Atypical P-ANCA titer Latest Ref Range: Neg:<1:20 titer <1:20  ENA SM Ab Ser-aCnc Latest Ref Range: 0.0 - 0.9 AI <0.2  Anti-MPO Antibodies Latest Ref Range: 0.0 - 0.9 units <0.2  Anti-PR3 Antibodies Latest Ref Range: 0.0 - 0.9 units <0.2  C1INH SerPl-mCnc Latest Ref Range: 21 - 39 mg/dL 25  C3 Complement Latest Ref Range: 82 - 167 mg/dL 107  Compl, Total (CH50) Latest Ref Range: >41 U/mL 50  Complement C4, Body Fluid Latest Ref Range: 12 - 38 mg/dL 21  Lyme Total Antibody EIA Latest Ref Range: Negative  Negative  SSA (Ro) (ENA) Antibody, IgG Latest Ref Range: 0.0 - 0.9 AI <0.2  SSB (  La) (ENA) Antibody, IgG Latest Ref Range: 0.0 - 0.9 AI <0.2  Hep A Ab, IgM Latest Ref Range: NON REACTIVE  NON REACTIVE  Hepatitis B Surface Ag Latest Ref Range: NON REACTIVE  NON REACTIVE  Hep B Core Ab, IgM Latest Ref Range: NON REACTIVE  NON REACTIVE  HCV Ab Latest Ref Range: NON REACTIVE  NON REACTIVE  HIV Screen 4th Generation wRfx Latest Ref Range: Non Reactive  Non Reactive    CSF culture neg CSF gram stain: no organisms seen, WBC present, predominantly mononuclear Cryptococcal antigen CSF negative CSF cytology -- negative for malignant cells CSF OCB - 0 band CSF VZV PCR positive  Assessment: 87 y.o. male with PMH of CAD, legally blind, diastolic CHF, HLD, AF on Eliquis, s/p left hip replacement admitted on 06/10/2021 with b/l leg R>L weakness and right LE rash started on valacyclovir for suspected shingles.  MRI of the brain unremarkable for acute intracranial, but there was a 9 mm nodular lesion along the left lateral aspect of the medulla, indeterminate.  No associated mass-effect.  MRA of head and neck negative.  LP CSF pleocytosis mainly lymphocytes  and elevated protein. Underwent extensive work up included infectious, vasculitis, paraneoplastic work-up unremarkable except CSF VZV PCR positive. Had acyclovir 14 day course so far in CIR working with therapist making some progress. Today, he reported vertigo episode and  found to have nystagmus. No ataxia or speech changes noted.  On my exam, pt can not provide history not fully orientated and seems to have cognitive impairment. Denies vertigo or HA, no nystagmus seen, still has right leg weaker than left. Etiology of his vertigo episode not quite clear, but given his hx of AF on eliquis and recent MRI showed 9 mm nodular lesion along the left lateral aspect of the medulla, will recommend repeat MRI with and without contrast to rule out stroke and reevaluate extra-medullary lesion.  Although recent VZV meningitis, he was treated with acyclovir and exam did not support vestibular neuritis, no feveror leukocytosis, do not feel LP need to be repeated.  He will need to hold off Eliquis and heparin IV or Lovenox bridge to get LP if needed.  Also recommend check orthostatic vitals to rule out orthostatic hypotension.   Plan: - MRI brain with and without contrast to rule out stroke and to re-evaluate extra-medullary lesion (ordered) - do not feel LP is needed at the time - OK to continue Eliquis for stroke prevention - check orthostatic vitals (ordered) - Continue PT/OT - Dr. Leonie Man will follow up in am  Thank you for this consultation and allowing [place] to participate in the care of this patient.  Rosalin Hawking, MD PhD Stroke Neurology 07/11/2021 5:23 PM

## 2021-07-11 NOTE — Progress Notes (Signed)
Patient appears lethargic at shift change. Groaning and moaning about abdominal discomfort, nausea, and dizziness.  On assessment patient had a hard stool in his rectum and smeared to his brief.  Enema administered as ordered.  Patient was unable to pass the stool while in bed; with Occupational therapy assistance we moved him to the bedside commode with the steady.  Patient was successful and passed a very large hard stool.  Multiple loose stool has happed since then.  Patient remains in bed at this time very lethargic.  Mental status shows no changes to previous; alert to person.  New orders to CT scan of the head, Meclizine PO, and to start IV hydration.  Meclizine administered, awaiting radiology for CT scan, and IV team to help with IV insertion.  Will continue to monitor.

## 2021-07-11 NOTE — Progress Notes (Signed)
Physical Therapy Session Note  Patient Details  Name: Parker Odonnell. MRN: [id number] Date of Birth: 1932-02-18  Today's Date: 07/11/2021 PT Individual Time: 1105-1200 PT Individual Time Calculation (min): 55 min   Short Term Goals: Week 3:  PT Short Term Goal 1 (Week 3): STGs=LTGs due to ELOS  Skilled Therapeutic Interventions/Progress Updates:     Session 1: Patient in bed with his daughter, [name], in the room upon PT arrival. Patient alert and agreeable to PT session. Patient denied pain during session.  OT reported patient had new onset of significant nystagmus with R rolling this morning with reports of dizziness, nausea, and vomiting. Attempted vestibular assessment. Patient with abnormal fixed gaze, requiring several attempt to fixate on a single target, abnormal smooth pursuits with undershooting and unable to perform VOR with eyes fixed with sudden onset of dizziness. Patient performed rolling L x2 without symptoms and R x2 with multidirectional nystagmus fatiguing to horizontal nystagmus that sustained as long as he was on his side, >2 min when timed on second trial. Patient unable to tolerate Marye Round testing due to continued dizziness and nausea.  Patient oriented x4, motor strength equal upper extremities and L>R for lower extremities without change. Finger to nose testing slow and deliberate.  MD and RN made aware of findings and new onset symptoms of nausea, dizziness, and multidirectional nystagmus; findings consistent with central vestibular disturbance.   Discussed findings with patient and his daughter and answered questions about cognitive changes and challenges with progress with cognitive and functional progress throughout patient's stay. Patient's daughter very appreciative and asking for further discussion about diagnosis/prognosis from MD, MD made aware.   Patient in bed with eyes closed at end of session with breaks locked, bed alarm set, and all needs within  reach.   Session 2: Patient in bed with his daughter and MD in the room upon PT arrival. Patient reported feeling "ill and weak," and stated he could not participated in therapeutic activity at this time. Offered to assist with changing his shirt and washing his face, as he had residual emesis on both, patient refused at this time reporting nausea and fatigue. Notified nursing that patient would need cleaned up when he was able to tolerate it. Patient missed 30 min of skilled PT due to nausea/fatigue, RN made aware. Will attempt to make-up missed time as able.     Therapy Documentation Precautions:  Precautions Precautions: Fall Precaution Comments: R>L lower extremity weakness, pt is legally blind, has foley catheter Restrictions Weight Bearing Restrictions: No General: PT Amount of Missed Time (min): 30 Minutes PT Missed Treatment Reason: Patient fatigue;Patient ill (Comment) (nausea/vomiting)    Therapy/Group: Individual Therapy  Rossi Silvestro L Eddy Liszewski PT, DPT  07/11/2021, 1:24 PM

## 2021-07-11 NOTE — Progress Notes (Signed)
Patient ID: [place] Sink., male   DOB: July 22, 1932, 87 y.o.   MRN: [id number]  SW received updates from OT reporting the family would like to pursue placement based on pt limited progress here in rehab.   SW left message for pt dtr [name] [phone]) to discuss further.   Pt has current NCPASRR #[id number].  *SW returned phone call to pt dtr [name]. She confirmed that SNF placement is best at this time for her father based on his current progress. SW discussed SNF placement process, bed availability, and insurance approval. Will explore SNF options, reports being interested in [facility]. SW informed will explore option as they review other SNF locations.   SW received updates from [facility] for [facility] who reported no beds available. Possibly next week there are beds. SW informed pt dtr [name]. SW emailed links for SNFs for further review and will address again on Monday. Also waiting on medical clearance for discharge.   SW sent out SNF referral.  Loralee Pacas, MSW, [place] Office: [phone] Cell: [phone] Fax: [phone]

## 2021-07-11 NOTE — Progress Notes (Signed)
Patient appears comfortable in bed. IV started with a 20gauge in left lateral lower arm.  Intact and patented receiving 0.9% NS at 71ml/hr.  Site shows no evidence of infiltration or redness.  Patient denies any dizziness at this time.  Follows commands and answers simple questions.  Tolerated all meds as prescribed.  Foley remains patent and intact with clear yellow urine noted.

## 2021-07-11 NOTE — NC FL2 (Signed)
[place] LEVEL OF CARE SCREENING TOOL     IDENTIFICATION  Patient Name: Parker Odonnell. Birthdate: Jul 30, 1932 Sex: male Admission Date (Current Location): 06/25/2021  [facility] and [place] Number:  Herbalist and Address:  [facility], [place] [address], [place], [place] [address]      Provider Number: [id number]  Attending Physician Name and Address:  Meredith Staggers, MD  Relative Name and Phone Number:  Richarda Blade #[phone]    Current Level of Care: Hospital Recommended Level of Care: Nursing Facility Prior Approval Number:    Date Approved/Denied:   PASRR Number: [id number]  Discharge Plan: SNF    Current Diagnoses: Patient Active Problem List   Diagnosis Date Noted   Malnutrition of moderate degree 07/03/2021   Hyperkalemia    Hyponatremia    Neuropathic pain    Pressure injury of skin 06/26/2021   VZV (varicella-zoster virus) infection 06/25/2021   Vasculitis ([place])    Chronic diastolic congestive heart failure (HCC)    Persistent atrial fibrillation (HCC)    Weakness of both lower extremities    Rash 06/10/2021   Weakness 06/10/2021   Chronic combined systolic (congestive) and diastolic (congestive) heart failure ([place]) 06/10/2021   DNR (do not resuscitate) 06/10/2021   OA (osteoarthritis) of hip 01/10/2020   Primary osteoarthritis of left hip 01/10/2020   Preoperative cardiovascular examination    Shortness of breath    Bilateral pleural effusion    Thrombocytopenia ([place]) 06/03/2018   Atrial flutter ([place]) 06/03/2018   Acute on chronic systolic CHF (congestive heart failure) ([place]) 06/03/2018   Bilateral edema of lower extremity    Essential hypertension 09/19/2015   LBBB (left bundle branch block) 09/19/2015   Hypokalemia 08/27/2014   ASCVD (arteriosclerotic cardiovascular disease)    Dyslipidemia    ED (erectile dysfunction)    Left bundle branch block    Postsurgical percutaneous transluminal  coronary angioplasty status    Coronary atherosclerosis of native coronary artery    Bilateral inguinal hernia 06/15/2012    Orientation RESPIRATION BLADDER Height & Weight     Self, Time, Place  Normal Indwelling catheter Weight: 138 lb 10.7 oz (62.9 kg) Height:  5\' 8"  (172.7 cm)  BEHAVIORAL SYMPTOMS/MOOD NEUROLOGICAL BOWEL NUTRITION STATUS      Incontinent Diet  AMBULATORY STATUS COMMUNICATION OF NEEDS Skin   Extensive Assist Verbally Normal                       Personal Care Assistance Level of Assistance  Bathing, Dressing Bathing Assistance: Maximum assistance Feeding assistance: Independent Dressing Assistance: Maximum assistance     Functional Limitations Info  Hearing   Hearing Info: Impaired      SPECIAL CARE FACTORS FREQUENCY  PT (By licensed PT), OT (By licensed OT), Speech therapy     PT Frequency: 5xs per week OT Frequency: 5xs per week     Speech Therapy Frequency: 5xs per week      Contractures Contractures Info: Not present    Additional Factors Info  Code Status, Allergies Code Status Info: Full Allergies Info: NKA           Current Medications (07/11/2021):  This is the current hospital active medication list Current Facility-Administered Medications  Medication Dose Route Frequency Provider Last Rate Last Admin   acetaminophen (TYLENOL) tablet 650 mg  650 mg Oral Q6H PRN Cathlyn Parsons, PA-C   650 mg at 07/08/21 3818   apixaban (  ELIQUIS) tablet 2.5 mg  2.5 mg Oral BID Cathlyn Parsons, PA-C   2.5 mg at 07/11/21 0859   atorvastatin (LIPITOR) tablet 40 mg  40 mg Oral QHS Cathlyn Parsons, PA-C   40 mg at 07/10/21 2045   benzocaine (ORAJEL) 10 % mucosal gel   Mouth/Throat QID Meredith Staggers, MD   Given at 07/11/21 0859   bisacodyl (DULCOLAX) EC tablet 5 mg  5 mg Oral Daily PRN Cathlyn Parsons, PA-C   5 mg at 07/03/21 6979   carvedilol (COREG) tablet 6.25 mg  6.25 mg Oral BID WC AngiulliLavon Paganini, PA-C   6.25 mg at 07/11/21  0900   Chlorhexidine Gluconate Cloth 2 % PADS 6 each  6 each Topical BID Meredith Staggers, MD   6 each at 07/11/21 0541   cloNIDine (CATAPRES - Dosed in mg/24 hr) patch 0.1 mg  0.1 mg Transdermal Weekly Raulkar, Clide Deutscher, MD   0.1 mg at 07/07/21 1510   docusate sodium (COLACE) capsule 100 mg  100 mg Oral BID Cathlyn Parsons, PA-C   100 mg at 07/11/21 0901   feeding supplement (ENSURE ENLIVE / ENSURE PLUS) liquid 237 mL  237 mL Oral TID BM AngiulliLavon Paganini, PA-C   237 mL at 07/10/21 0923   lidocaine (LIDODERM) 5 % 1 patch  1 patch Transdermal Q24H Raulkar, Clide Deutscher, MD   1 patch at 07/10/21 1434   lip balm (CARMEX) ointment 1 application  1 application Topical PRN Angiulli, Lavon Paganini, PA-C       liver oil-zinc oxide (DESITIN) 40 % ointment 1 application  1 application Topical TID PRN Cathlyn Parsons, PA-C   1 application at 48/01/65 2019   multivitamin with minerals tablet 1 tablet  1 tablet Oral Daily AngiulliLavon Paganini, PA-C   1 tablet at 07/11/21 0901   mupirocin cream (BACTROBAN) 2 % 1 application  1 application Topical BID Cathlyn Parsons, PA-C   1 application at 53/74/82 0900   ondansetron (ZOFRAN) tablet 4 mg  4 mg Oral Q8H PRN Cathlyn Parsons, PA-C   4 mg at 07/09/21 0747   polyethylene glycol (MIRALAX / GLYCOLAX) packet 17 g  17 g Oral Daily Cathlyn Parsons, PA-C   17 g at 07/11/21 0900   polyvinyl alcohol (LIQUIFILM TEARS) 1.4 % ophthalmic solution 1 drop  1 drop Both Eyes BID PRN Angiulli, Lavon Paganini, PA-C       pregabalin (LYRICA) capsule 25 mg  25 mg Oral BID Alger Simons T, MD   25 mg at 07/11/21 0900   sorbitol 70 % solution 60 mL  60 mL Oral Daily PRN Angiulli, Lavon Paganini, PA-C       tamsulosin [facility]) capsule 0.4 mg  0.4 mg Oral Daily Cathlyn Parsons, PA-C   0.4 mg at 07/11/21 0900   traMADol (ULTRAM) tablet 50 mg  50 mg Oral Q6H PRN AngiulliLavon Paganini, PA-C   50 mg at 07/11/21 7078     Discharge Medications: Please see discharge summary for a list of  discharge medications.  Relevant Imaging Results:  Relevant Lab Results:   Additional Information [id number]  Rana Snare, LCSW

## 2021-07-11 NOTE — Progress Notes (Addendum)
PROGRESS NOTE   Subjective/Complaints: Pt not feeling that well this morning. Some nausea, +constipation, daughter at bedside. Pt says he feels that the room is spinning on him   ROS: Patient denies fever, rash, sore throat, blurred vision, nausea, vomiting, diarrhea, cough, shortness of breath or chest pain, joint or back pain, headache, or mood change.   Objective:   No results found. No results for input(s): WBC, HGB, HCT, PLT in the last 72 hours.   No results for input(s): NA, K, CL, CO2, GLUCOSE, BUN, CREATININE, CALCIUM in the last 72 hours.   Intake/Output Summary (Last 24 hours) at 07/11/2021 1006 Last data filed at 07/11/2021 0442 Gross per 24 hour  Intake 600 ml  Output 1100 ml  Net -500 ml         Physical Exam: Vital Signs Blood pressure (!) 151/64, pulse 70, temperature 98.2 F (36.8 C), temperature source Oral, resp. rate 19, height 5\' 8"  (1.727 m), weight 62.9 kg, SpO2 98 %. Constitutional: appears uncomfortable. Vital signs reviewed. HEENT: NCAT, EOMI, oral membranes moist Neck: supple Cardiovascular: RRR without murmur. No JVD    Respiratory/Chest: CTA Bilaterally without wheezes or rales. Normal effort    GI/Abdomen: BS +, non-tender, non-distended Ext: no clubbing, cyanosis, or edema Psych: pleasant and cooperative  Skin: healing abrasions both legs Musc: No edema in extremities.  Improved tenderness in extremities, esp RLE. Neuro: Alert, fair insight and awareness. Still with STM deficits. Nystagmus with lateral gaze today Motor: Bilateral UE 5/5.  LLE 3+/5 prox to 4/5 distally.  RLE 2+ to 3-/5 prox to distal    Assessment/Plan: 1. Functional deficits which require 3+ hours per day of interdisciplinary therapy in a comprehensive inpatient rehab setting. Physiatrist is providing close team supervision and 24 hour management of active medical problems listed below. Physiatrist and rehab team  continue to assess barriers to discharge/monitor patient progress toward functional and medical goals  Care Tool:  Bathing    Body parts bathed by patient: Right arm, Left arm, Chest, Abdomen, Front perineal area, Right upper leg, Left upper leg, Face   Body parts bathed by helper: Right lower leg, Left lower leg, Buttocks     Bathing assist Assist Level: Moderate Assistance - Patient 50 - 74%     Upper Body Dressing/Undressing Upper body dressing   What is the patient wearing?: Pull over shirt    Upper body assist Assist Level: Supervision/Verbal cueing    Lower Body Dressing/Undressing Lower body dressing      What is the patient wearing?: Pants, Incontinence brief     Lower body assist Assist for lower body dressing: Maximal Assistance - Patient 25 - 49%     Toileting Toileting    Toileting assist Assist for toileting: Maximal Assistance - Patient 25 - 49%     Transfers Chair/bed transfer  Transfers assist     Chair/bed transfer assist level: Supervision/Verbal cueing Chair/bed transfer assistive device: Sliding board   Locomotion Ambulation   Ambulation assist      Assist level: 2 helpers Assistive device: Walker-rolling Max distance: 5 ft   Walk 10 feet activity   Assist  Walk 10 feet activity did not occur:  Safety/medical concerns        Walk 50 feet activity   Assist Walk 50 feet with 2 turns activity did not occur: Safety/medical concerns         Walk 150 feet activity   Assist Walk 150 feet activity did not occur: Safety/medical concerns         Walk 10 feet on uneven surface  activity   Assist Walk 10 feet on uneven surfaces activity did not occur: Safety/medical concerns         Wheelchair     Assist Is the patient using a wheelchair?: Yes   Wheelchair activity did not occur: Safety/medical concerns  Wheelchair assist level: Supervision/Verbal cueing Max wheelchair distance: >150 ft    Wheelchair 50  feet with 2 turns activity    Assist    Wheelchair 50 feet with 2 turns activity did not occur: Safety/medical concerns   Assist Level: Supervision/Verbal cueing   Wheelchair 150 feet activity     Assist  Wheelchair 150 feet activity did not occur: Safety/medical concerns   Assist Level: Supervision/Verbal cueing   Blood pressure (!) 151/64, pulse 70, temperature 98.2 F (36.8 C), temperature source Oral, resp. rate 19, height 5\' 8"  (1.727 m), weight 62.9 kg, SpO2 98 %.  Medical Problem List and Plan: 1.  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  IV acyclovir completed today 06/25/2021  -Continue CIR therapies including PT, OT, and SLP   -adjusted goals to w/c level--may need SNF  9/23-pt with new onset vertigo/nystagmus. Therapy was not able to localize. Will check HCT given his ams.  2.  Antithrombotics: -DVT/anticoagulation: Eliquis [employer]: Other (comment)             -antiplatelet therapy: N/A 3. Neuropathic pain R>L LE: -Lyrica    75mg  TID -continue tramadol as needed -lidoderm patch             9/23 given ongoing confusion--dc lyrica and tramadol 4. Mood: Provide emotional support             -antipsychotic agents: N/A 5. Neuropsych: This patient is capable of making decisions on his own behalf. 6. Skin/Wound Care: Routine skin checks 7. Fluids/Electrolytes/Nutrition: I personally reviewed all of the patient's labs today, and lab work is within normal limits.              encouraging po, check labs today 9/23 8.  Viral leg rash/?vasculitis.  All lesions fully crusted.  Isolation discontinued  -skin bx --perivascular dermatitis  -pain better  -cbc stable 9.  Hyponatremia.                    9/16 Na+ up to 140 10.  Persistent atrial fibrillation.  Cardiac rate controlled.  Continue Coreg 6.25 mg twice daily/Eliquis             Heart rate controlled 9/23 11.  Diastolic congestive heart failure.  Ejection fraction 35 to 40% echocardiogram 2020.   Filed  Weights   07/09/21 0500 07/10/21 0500 07/11/21 0442  Weight: 63.5 kg 63.6 kg 62.9 kg    Stable on 9/23 12.  Hypertension. Stopped Norvasc. Added clonidine patch right lower extremity for HTN and neuropathic pain  Controlled on 9/23 13.  BPH/mild hydronephrosis.  Continue Flomax 0.4 mg daily. Maintain Foley tube until follow-up outpatient urology.  Patient failed voiding trial 06/21/2021  -9/23 urine amber colored this morning but otherwise has been clear, yellow. Cbc reviewed and wnl today  14.  Hyperlipidemia: Lipitor 15.  L1 superior endplate compression fracture/mild edema with spinous process T4-T5, sacral insufficiency fracture.  Supportive care 16.  5 mm right lower lobe pulmonary nodule versus intrapulmonary lymph node.  Repeat noncontrast CT chest 12 months 17. Hyperkalemia: kayexalate lokelma give so far Potassium 4.8 on 9/16, 4.1 9/19 18. Slow transit constipation, associated nausea  -sorbitol today, SSE if needed 19. Chronic thrombocytopenia is stable   LOS: 16 days A FACE TO FACE EVALUATION WAS PERFORMED  Parker Odonnell 07/11/2021, 10:06 AM

## 2021-07-12 ENCOUNTER — Inpatient Hospital Stay (HOSPITAL_COMMUNITY): Payer: Medicare Other

## 2021-07-12 DIAGNOSIS — E440 Moderate protein-calorie malnutrition: Secondary | ICD-10-CM

## 2021-07-12 DIAGNOSIS — H814 Vertigo of central origin: Secondary | ICD-10-CM

## 2021-07-12 LAB — BASIC METABOLIC PANEL
Anion gap: 6 (ref 5–15)
BUN: 15 mg/dL (ref 8–23)
CO2: 24 mmol/L (ref 22–32)
Calcium: 9.2 mg/dL (ref 8.9–10.3)
Chloride: 106 mmol/L (ref 98–111)
Creatinine, Ser: 0.97 mg/dL (ref 0.61–1.24)
GFR, Estimated: >60 mL/min (ref >60)
Glucose, Bld: 102 mg/dL — ABNORMAL HIGH (ref 70–99)
Potassium: 3.4 mmol/L — ABNORMAL LOW (ref 3.5–5.1)
Sodium: 136 mmol/L (ref 135–145)

## 2021-07-12 MED ORDER — MEGESTROL ACETATE 400 MG/10ML PO SUSP
400.0000 mg | Freq: Two times a day (BID) | ORAL | Status: DC
  Administered 2021-07-12 – 2021-07-18 (×13): 400 mg via ORAL
  Filled 2021-07-12 (×15): qty 10

## 2021-07-12 MED ORDER — POTASSIUM CHLORIDE IN NACL 20-0.9 MEQ/L-% IV SOLN
INTRAVENOUS | Status: DC
  Filled 2021-07-12 (×4): qty 1000

## 2021-07-12 MED ORDER — MECLIZINE HCL 12.5 MG PO TABS
12.5000 mg | ORAL_TABLET | Freq: Three times a day (TID) | ORAL | Status: DC | PRN
  Administered 2021-07-12 – 2021-07-14 (×2): 12.5 mg via ORAL
  Filled 2021-07-12 (×3): qty 1

## 2021-07-12 NOTE — Progress Notes (Addendum)
STROKE TEAM PROGRESS NOTE   INTERVAL HISTORY His son is at the bedside.  Chart reviewed and pt/family updated. The pt is pleasantly confused and cannot give any input to situation. No further neuro work up needed at this time. Past EMR notes "DNR". Son confirms that pt wishes to be DNR/DNI and this order was placed and primary team notified of change.  MRI scan of the brain is negative for any acute abnormality.  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  Patient denies any symptoms of vertigo or dizziness. Vitals:   07/11/21 1834 07/11/21 2000 07/12/21 0430 07/12/21 0500  BP: (!) 155/83 125/67 (!) 144/62   Pulse: 85 67 (!) 51   Resp: 16 18    Temp:  (!) 97.4 F (36.3 C) (!) 97.5 F (36.4 C)   TempSrc:  Oral Oral   SpO2: 100% 98% 99%   Weight:    64.5 kg  Height:       CBC:  Recent Labs  Lab 07/07/21 0620 07/11/21 1056  WBC 3.5* 5.4  HGB 9.5* 10.5*  HCT 28.9* 31.2*  MCV 104.0* 101.6*  PLT 153 329*   Basic Metabolic Panel:  Recent Labs  Lab 07/11/21 1056 07/12/21 0651  NA 133* 136  K 4.0 3.4*  CL 103 106  CO2 23 24  GLUCOSE 145* 102*  BUN 16 15  CREATININE 0.94 0.97  CALCIUM 8.9 9.2   Lipid Panel: No results for input(s): CHOL, TRIG, HDL, CHOLHDL, VLDL, LDLCALC in the last 168 hours. HgbA1c: No results for input(s): HGBA1C in the last 168 hours. Urine Drug Screen: No results for input(s): LABOPIA, COCAINSCRNUR, LABBENZ, AMPHETMU, THCU, LABBARB in the last 168 hours.  Alcohol Level No results for input(s): ETH in the last 168 hours.  IMAGING past 24 hours DG Abd 1 View  Result Date: 07/12/2021 CLINICAL DATA:  Possible obstipation. EXAM: ABDOMEN 2 VIEW COMPARISON:  CT, 06/14/2021. FINDINGS: Normal bowel gas pattern. No significant increase in colonic stool burden. No evidence of renal or ureteral stones. Status post  cholecystectomy and left total hip arthroplasty stable from the prior exam. No acute skeletal abnormality. IMPRESSION: 1. No acute findings.  No increase in the colonic stool burden. Electronically Signed   By: Lajean Manes M.D.   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 Found to have indeterminate extramedullary lesion on prior brain MRI, re-evaluate. EXAM: MRI HEAD WITHOUT AND WITH CONTRAST TECHNIQUE: Multiplanar, multiecho pulse sequences of the brain and surrounding structures were obtained without and with intravenous contrast. CONTRAST:  61mL GADAVIST GADOBUTROL 1 MMOL/ML IV SOLN COMPARISON:  Comparison made with recent brain MRI from 06/12/2021. FINDINGS: Brain: Stable cerebral volume, felt to be within normal limits for age. No significant cerebral white matter disease. Note made of a small focus of encephalomalacia and gliosis at the anterior aspect of the left frontal lobe, likely reflecting a tiny remote cortical infarct (series 11, images 16, 15). This is stable in retrospect as compared to prior exam, somewhat difficult to see on prior study given motion artifact on that study. No abnormal foci of restricted diffusion to suggest acute or subacute ischemia. Gray-white matter differentiation maintained. No other areas of chronic or  interval cortical infarction. No evidence for acute or chronic intracranial hemorrhage. Again seen is a 9 mm well-circumscribed ovoid extramedullary lesion positioned along the left aspect of the medulla, adjacent to the left foramen of Luschka (series 11, image 5). Again, this lesion is seen to demonstrate no appreciable enhancement, and is stable from prior. No associated mass effect. No other mass lesion, midline shift or mass effect. Mild ventricular prominence related to global parenchymal volume loss  without hydrocephalus. No extra-axial fluid collection. Stable appearance of the pituitary gland and suprasellar region normal. Midline structures intact. No abnormal enhancement. Vascular: Major intracranial vascular flow voids are maintained. Skull and upper cervical spine: Degenerative thickening noted about the tectorial membrane without significant stenosis. Craniocervical junction otherwise unremarkable. Bone marrow signal intensity remains within normal limits. No scalp soft tissue abnormality. Sinuses/Orbits: Globes and orbital soft tissues demonstrate no acute finding. Mild scattered mucosal thickening noted within the ethmoidal air cells and maxillary sinuses. Superimposed small right maxillary sinus retention cyst. Moderate right with small left mastoid effusions, similar to previous. Visualized nasopharynx unremarkable. Inner ear structures grossly normal. Other: None. IMPRESSION: 1. No acute intracranial abnormality. 2. 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 No associated mass effect. 3. 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 4. Otherwise normal brain MRI for age. 5. Moderate right with small left mastoid effusions, similar to previous. Electronically Signed   By: Jeannine Boga M.D.   On: 07/12/2021 01:24    PHYSICAL EXAM General: Appears well-developed.  Pleasant elderly Caucasian male no acute distress. Psych: Affect appropriate to situation Eyes: No scleral injection HENT: No OP obstrucion Head: Normocephalic.  Cardiovascular: Normal rate, but irregular rhythm.  Respiratory: Effort normal and breath sounds normal to anterior ascultation GI: Soft.  No distension. There is no tenderness.  Skin: WDI    Neurological Examination Mental Status: Alert, oriented to name only and knows this is a  hospital, but unable to state correctly any other orientation questions and lacks insight to situation. Follows simple commands briskly, more difficulty with complex commands Cranial Nerves: He has baseline low vision d/t glaucoma, making full VF difficult, but EOMI and finger counting when very close to face. Face is mildly asymettric, sensation intact. Mild HOH. Tongue midline. Motor: Decreased bulk, appropriate for age. Generally weak w/o any focal deficits.  Sensory: Pinprick and light touch intact throughout, bilaterally Deep Tendon Reflexes: 2+ and symmetric throughout Cerebellar:no gross ataxia; limited complex commands Gait: did not test  ASSESSMENT/PLAN Parker Odonnell. is a 87 y.o. male who is legally blind and has dementia with medical history of CAD, diastolic CHF, HLD, AF on Eliquis, s/p left hip replacement admitted on 06/10/2021 with b/l leg R>L weakness and right LE rash started on valacyclovir for suspected shingles.  MRI of the brain unremarkable for acute intracranial, but there was a 9 mm nodular lesion along the left lateral aspect of the medulla, indeterminate.  No associated mass-effect.  MRA of head and neck negative.  LP CSF pleocytosis mainly lymphocytes and elevated protein. Underwent extensive work up included infectious, vasculitis, paraneoplastic work-up unremarkable except CSF VZV PCR positive. Had acyclovir 14 day course so far in CIR working with therapist making some progress. Neurology was consulted for reported vertigo episode and found to have nystagmus. No ataxia or speech changes noted.  No evidence of stroke on f/u  MRI. Doubt this event was neurovascular in nature. The abnormal lesion not likely an issue and is chronic. No further work up at this time. VTE prophylaxis - on Eliquis    Diet   Diet regular Room service appropriate? No; Fluid consistency: Thin   Eliquis (apixaban) daily prior to admission, now on Eliquis (apixaban) daily.  Therapy  recommendations:  pending Disposition:  pending  Hypertension Home meds:  Norvasc, Coreg, Stable No role in Permissive hypertension  Long-term BP goal normotensive  Hyperlipidemia Home meds:  Lipitor 40mg  po daily, resumed in hospital LDL not done since there is no stroke  Continue statin at discharge for primary stroke prevention  Diabetes type II (No hx) HgbA1c No results found for requested labs within last 26280 hours., goal < 7.0 CBGs No results for input(s): GLUCAP in the last 72 hours.  SSI  Other Stroke Risk Factors Advanced Age >/= 40  Hx stroke/TIA Family hx stroke  Coronary artery disease s/p stents Congestive heart failure  Other Active Problems GERD Shingles VZV meningitis Dementia Legally Blind DNR noted in EHR, but currently [facility]  [place], ARNP-C, ANVP-BC Pager: [phone]  STROKE MD NOTE :  Patient and family deny any focal symptoms suggestive of stroke or TIA.  Patient specifically denies any vertigo or dizziness.  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  No further stroke work-up is necessary.  Stroke team will sign off.  Kindly call for questions.  Greater than 50% time during this 35-minute visit was spent in counseling and coordination of care about his dizziness and discussion of imaging results with patient and family and answering questions.  Stroke team will sign off.  Kindly call for questions Antony Contras, MD To contact Stroke Continuity provider, please refer to [email or web address] After hours, contact General Neurology

## 2021-07-12 NOTE — Progress Notes (Signed)
Physical Therapy Session Note  Patient Details  Name: Parker Odonnell. MRN: [id number] Date of Birth: Dec 03, 1931  Today's Date: 07/12/2021 PT Missed Time: 75 Minutes Missed Time Reason: Patient ill (Comment);Xray  Upon therapist arrival x-ray technician in room. Once complete, pt received R sidelying in bed holding onto bedrail with eyes tightly shut. Pt with difficulty describing his symptoms despite questions stating "I'm just about to go blind" (pt still keeping eyes shut at this time and stating he is not able to open them for ~20 seconds due to his symptoms of dizziness with nausea). Pt then states "I don't know how to say it" but once opening eyes pt able to correctly identify therapist's shirt and pen color along with number of fingers held up ~76ft from his face. In the brief moments pt would partially open his eyes no visible nystagmus noted. Nursing staff aware of pt's symptoms. Pt states "I hate to say no" referring to participating in therapy but politely declines. Pt left R sidelying in bed, still holding onto bedrail in position of discomfort/distress with family entering upon therapist exiting. Missed 75 minutes of skilled physical therapy.  Tawana Scale , PT, DPT, NCS, CSRS  07/12/2021, 7:55 AM

## 2021-07-12 NOTE — Progress Notes (Signed)
PROGRESS NOTE   Subjective/Complaints: Pt slept overnight without issues. Told me he felt tired and weak today but denied dizziness. 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 Found to have indeterminate extramedullary lesion on prior brain MRI, re-evaluate. EXAM: MRI HEAD WITHOUT AND WITH CONTRAST TECHNIQUE: Multiplanar, multiecho pulse sequences of the brain and surrounding structures were obtained without and with intravenous contrast. CONTRAST:  22mL GADAVIST GADOBUTROL 1 MMOL/ML IV SOLN COMPARISON:  Comparison made with recent brain MRI from 06/12/2021. FINDINGS: Brain: Stable cerebral volume, felt to be within normal limits for age. No significant cerebral white matter disease. Note made of a small focus of encephalomalacia and gliosis at the anterior aspect of the left frontal lobe, likely reflecting a tiny remote cortical infarct (series 11, images 16, 15). This is stable in retrospect as compared to prior exam, somewhat difficult to see on prior study given motion artifact on that study. No abnormal foci of restricted diffusion to suggest acute or subacute ischemia. Gray-white matter differentiation maintained. No other areas of chronic or interval cortical infarction. No evidence for acute or chronic intracranial hemorrhage. Again seen is a 9 mm well-circumscribed ovoid extramedullary lesion positioned along the left aspect of the medulla, adjacent to the left foramen of Luschka (series 11, image 5). Again, this lesion is seen to demonstrate no appreciable enhancement, and is stable from prior. No associated mass effect. No other mass lesion, midline shift or mass effect. Mild  ventricular prominence related to global parenchymal volume loss without hydrocephalus. No extra-axial fluid collection. Stable appearance of the pituitary gland and suprasellar region normal. Midline structures intact. No abnormal enhancement. Vascular: Major intracranial vascular flow voids are maintained. Skull and upper cervical spine: Degenerative thickening noted about the tectorial membrane without significant stenosis. Craniocervical junction otherwise unremarkable. Bone marrow signal intensity remains within normal limits. No scalp soft tissue abnormality. Sinuses/Orbits: Globes and orbital soft tissues demonstrate no acute finding. Mild scattered mucosal thickening noted within the ethmoidal air cells and maxillary sinuses. Superimposed small right maxillary sinus retention cyst. Moderate right with small left mastoid effusions, similar to previous. Visualized nasopharynx unremarkable. Inner ear structures grossly normal. Other: None. IMPRESSION: 1. No acute intracranial abnormality. 2. 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 No associated mass effect. 3. 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 4. Otherwise normal brain MRI for age. 5. Moderate right with small left mastoid effusions, similar to previous. Electronically Signed   By: Jeannine Boga M.D.   On: 07/12/2021 01:24   Recent Labs    07/11/21 1056  WBC 5.4  HGB 10.5*  HCT 31.2*  PLT 131*     Recent Labs    07/11/21 1056  NA 133*  K 4.0  CL 103  CO2 23  GLUCOSE 145*  BUN 16  CREATININE 0.94  CALCIUM 8.9     Intake/Output Summary (Last 24 hours) at 07/12/2021 0752 Last data filed at 07/12/2021 0450 Gross per 24 hour  Intake 86.45 ml  Output 800 ml  Net -713.55 ml         Physical Exam: Vital Signs Blood pressure (!)  144/62, pulse (!) 51, temperature (!) 97.5 F (36.4 C), temperature source Oral, resp. rate 18, height 5\' 8"  (1.727 m), weight 64.5 kg, SpO2 99 %. Constitutional: No distress . Vital signs reviewed. HEENT: NCAT, EOMI, oral membranes moist Neck: supple Cardiovascular: RRR without murmur. No JVD    Respiratory/Chest: CTA Bilaterally without wheezes or rales. Normal effort    GI/Abdomen: BS +, non-tender, non-distended Ext: no clubbing, cyanosis, or edema Psych: flat, cooperative  Skin: healing abrasions both legs Musc: No edema in extremities.  Minimal LE tenderness Neuro: slow to awaken this morning, limited insight and awareness. Still with STM deficits. Did not appreciate nystagmus today but patient not fully opening eyes. Motor: Bilateral UE 5/5.  LLE 3+/5 prox to 4/5 distally.  RLE 2+ to 3-/5 prox to distal    Assessment/Plan: 1. Functional deficits which require 3+ hours per day of interdisciplinary therapy in a comprehensive inpatient rehab setting. Physiatrist is providing close team supervision and 24 hour management of active medical problems listed below. Physiatrist and rehab team continue to assess barriers to discharge/monitor patient progress toward functional and medical goals  Care Tool:  Bathing    Body parts bathed by patient: Right arm, Left arm, Chest, Abdomen, Front perineal area, Right upper leg, Left upper leg, Face   Body parts bathed by helper: Right lower leg, Left lower leg, Buttocks     Bathing assist Assist Level: Moderate Assistance - Patient 50 - 74%     Upper Body Dressing/Undressing Upper body dressing   What is the patient wearing?: Pull over shirt    Upper body assist Assist Level: Supervision/Verbal cueing    Lower Body Dressing/Undressing Lower body dressing      What is the patient wearing?: Pants, Incontinence brief     Lower body assist Assist for lower body dressing: Maximal Assistance - Patient 25 - 49%      Toileting Toileting    Toileting assist Assist for toileting: Maximal Assistance - Patient 25 - 49%     Transfers Chair/bed transfer  Transfers assist     Chair/bed transfer assist level: Supervision/Verbal cueing Chair/bed transfer assistive device: Sliding board   Locomotion Ambulation   Ambulation assist      Assist level: 2 helpers Assistive device: Walker-rolling Max distance: 5 ft   Walk 10 feet activity   Assist  Walk 10 feet activity did not occur: Safety/medical concerns        Walk 50 feet activity   Assist Walk 50 feet with 2 turns activity did not occur: Safety/medical concerns         Walk 150 feet activity   Assist Walk 150 feet activity did not occur: Safety/medical concerns         Walk 10 feet on uneven surface  activity   Assist Walk 10 feet on uneven surfaces activity did not occur: Safety/medical concerns         Wheelchair     Assist Is the patient using a wheelchair?: Yes   Wheelchair activity did not occur: Safety/medical concerns  Wheelchair assist level: Supervision/Verbal cueing Max wheelchair distance: >150 ft    Wheelchair 50 feet with 2 turns activity  Assist    Wheelchair 50 feet with 2 turns activity did not occur: Safety/medical concerns   Assist Level: Supervision/Verbal cueing   Wheelchair 150 feet activity     Assist  Wheelchair 150 feet activity did not occur: Safety/medical concerns   Assist Level: Supervision/Verbal cueing   Blood pressure (!) 144/62, pulse (!) 51, temperature (!) 97.5 F (36.4 C), temperature source Oral, resp. rate 18, height 5\' 8"  (1.727 m), weight 64.5 kg, SpO2 99 %.  Medical Problem List and Plan: 1.  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  IV acyclovir completed today 06/25/2021  -Continue CIR therapies including PT, OT, and SLP   -adjusted goals to w/c level--may need SNF -9/24-pt with new onset vertigo/nystagmus. Ongoing relative  lethargy. -Appreciate neurology input. MRI does not demonstrate any acute findings. 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  -mild hyponatremia: awaiting this morning's labs -neurosedating meds held -observe for further vertigo,nystagmus 2.  Antithrombotics: -DVT/anticoagulation: [employer]: Other (comment)             -antiplatelet therapy: N/A 3. Neuropathic pain R>L LE: -Lyrica    75mg  TID -continue tramadol as needed -lidoderm patch             9/23 stopped lyrica and tramadol d/t lethargy 4. Mood: Provide emotional support             -antipsychotic agents: N/A 5. Neuropsych: This patient is capable of making decisions on his own behalf. 6. Skin/Wound Care: Routine skin checks 7. Fluids/Electrolytes/Nutrition: I personally reviewed all of the patient's labs today, and lab work is within normal limits.            9/24  -continue IVF until po picks up  -add megace for appetite  -labs pending 8.  Viral leg rash/?vasculitis.  All lesions fully crusted.  Isolation discontinued  -skin bx --perivascular dermatitis  -pain better  -cbc stable 9.  Hyponatremia.                    9/16 Na+ up to 140 10.  Persistent atrial fibrillation.  Cardiac rate controlled.  Continue Coreg 6.25 mg twice daily/Eliquis             Heart rate controlled 9/24 11.  Diastolic congestive heart failure.  Ejection fraction 35 to 40% echocardiogram 2020.   Filed Weights   07/10/21 0500 07/11/21 0442 07/12/21 0500  Weight: 63.6 kg 62.9 kg 64.5 kg    Stable on 9/23 12.  Hypertension. Stopped Norvasc. Added clonidine patch right lower extremity for HTN and neuropathic pain  Controlled on 9/24 13.  BPH/mild hydronephrosis.  Continue Flomax 0.4 mg daily. Maintain Foley tube until follow-up outpatient urology.  Patient failed voiding trial 06/21/2021  -9/24 urine remains clear, afebrile. Continue foley care 14.  Hyperlipidemia: Lipitor 15.  L1 superior  endplate compression fracture/mild edema with spinous process T4-T5, sacral insufficiency fracture.  Supportive care 16.  5 mm right lower lobe pulmonary nodule versus intrapulmonary lymph node.  Repeat noncontrast CT chest 12 months 17. Hyperkalemia: kayexalate lokelma give so far Potassium 4.8 on 9/16, 4.1 9/19 18. Slow transit constipation, associated nausea  -sorbitol with results  -9/24 check kub today 19. Chronic thrombocytopenia is stable  Greater than 35 total minutes was spent in examination of patient, assessment of pertinent data,  formulation of a treatment plan, and in discussion with patient and/or family.    LOS: 17 days A FACE TO [place]  Alen Blew 07/12/2021, 7:52 AM

## 2021-07-12 NOTE — Progress Notes (Signed)
Speech Language Pathology Daily Session Note  Patient Details  Name: Parker Odonnell. MRN: [id number] Date of Birth: May 23, 1932  Today's Date: 07/12/2021 SLP Individual Time: 1130-1200 SLP Individual Time Calculation (min): 30 min  Short Term Goals: Week 3: SLP Short Term Goal 1 (Week 3): STGs=LTGs due to ELOS  Skilled Therapeutic Interventions:Skilled ST services focused on cognitive skills. Pt was orientated to place, DOW and intermittently to situation. Pt was able to recall orientation information with a 5 minute delay of place, year, situation and DOW only. SLP educated pt on visualization and association strategies to recall novel information, pt required several trials for immediate recall of novel 4 words using both strategies with ability to recall 1 out 4 words and 4 out 4 words with category cues and a 3 minute delay. Pt was able to apply recall strategies to second trial of recalling 4 novel words with mod A verbal cues, pt was able to recall 2 out 4 words and 4 out 4 words with a 3 minute delay. Pt was left in room with call bell within reach and bed alarm set. SLP recommends to continue skilled services.     Pain Pain Assessment Pain Score: 0-No pain  Therapy/Group: Individual Therapy  Danon Lograsso  [facility] 07/12/2021, 12:10 PM

## 2021-07-12 NOTE — Progress Notes (Signed)
Patient appears to be lethargic most of the shift and hallucinating. Patient continues to talk out and singing nursery rhymes.  When NT was cleaning patient up, patient told her to get in bed with him.  During medication administration, patient stated, "Does the football players have this thing in their penis' too (pointing to his foley catheter)? This inquired from patient why he had asked the question.  Patient stated, "If you don't know I can take you to them and we can find out."  Patient pulled out existing IV line today while infusing.  New IV line inserted by IV team in upper left arm.  Patient had a large soft BM today.

## 2021-07-13 LAB — BASIC METABOLIC PANEL
Anion gap: 4 — ABNORMAL LOW (ref 5–15)
BUN: 13 mg/dL (ref 8–23)
CO2: 24 mmol/L (ref 22–32)
Calcium: 8.8 mg/dL — ABNORMAL LOW (ref 8.9–10.3)
Chloride: 107 mmol/L (ref 98–111)
Creatinine, Ser: 0.99 mg/dL (ref 0.61–1.24)
GFR, Estimated: >60 mL/min (ref >60)
Glucose, Bld: 105 mg/dL — ABNORMAL HIGH (ref 70–99)
Potassium: 4 mmol/L (ref 3.5–5.1)
Sodium: 135 mmol/L (ref 135–145)

## 2021-07-13 NOTE — Progress Notes (Signed)
Physical Therapy Note  Patient Details  Name: Parker Odonnell. MRN: [id number] Date of Birth: [birth year] Today's Date: 07/13/2021    Pt refused to participate even w/ increased coaxing and attempted return to complete missed time.  Pt not stating any reason, other than "not today".  Missed time 60'.   Ladoris Gene 07/13/2021, 11:42 AM

## 2021-07-13 NOTE — Progress Notes (Signed)
Speech Language Pathology Daily Session Note  Patient Details  Name: Parker Odonnell. MRN: [id number] Date of Birth: May 23, 1932  Today's Date: 07/13/2021 SLP Individual Time: 3159-4585 SLP Individual Time Calculation (min): 30 min  Short Term Goals: Week 3: SLP Short Term Goal 1 (Week 3): STGs=LTGs due to ELOS  Skilled Therapeutic Interventions: Pt seen for skilled ST with focus on cognitive goals, pt lying in bed with son present throughout. Pt oriented to place and DOW, not oriented to [facility], situation or aspects of previous living situation. Pt also demonstrating some difficulty with auditory comprehension, benefiting from simple concrete questions. Pt with some restlessness in bed, lying on right side slouched down, but unable to detail pain besides "legs". Spoke with son outside who states pt has had significant cognitive decline in recent weeks, most especially the last few days. Pt unable to recall basic information including name of ILF, activities he participated in, going to meals, etc. Discussed strategies to promote cognition including orientation concepts, reading aloud to patient, etc. Pt left in bed with alarm set and son present. Cont ST POC.  Pain Pain Assessment Pain Scale: 0-10 Pain Score: 0-No pain  Therapy/Group: Individual Therapy  Dewaine Conger 07/13/2021, 3:11 PM

## 2021-07-13 NOTE — Progress Notes (Signed)
PROGRESS NOTE   Subjective/Complaints:  No issues overnight, refused therapy this morning, did not feel like he had the energy.  Now feels bored  ROS: Limited due to cognitive/behavioral   Objective:   DG Abd 1 View  Result Date: 07/12/2021 CLINICAL DATA:  Possible obstipation. EXAM: ABDOMEN 2 VIEW COMPARISON:  CT, 06/14/2021. FINDINGS: Normal bowel gas pattern. No significant increase in colonic stool burden. No evidence of renal or ureteral stones. Status post cholecystectomy and left total hip arthroplasty stable from the prior exam. No acute skeletal abnormality. IMPRESSION: 1. No acute findings.  No increase in the colonic stool burden. Electronically Signed   By: Lajean Manes M.D.   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 Found to have indeterminate extramedullary lesion on prior brain MRI, re-evaluate. EXAM: MRI HEAD WITHOUT AND WITH CONTRAST TECHNIQUE: Multiplanar, multiecho pulse sequences of the brain and surrounding structures were obtained without and with intravenous contrast. CONTRAST:  56mL GADAVIST GADOBUTROL 1 MMOL/ML IV SOLN COMPARISON:  Comparison made with recent brain MRI from 06/12/2021. FINDINGS: Brain: Stable cerebral volume, felt to be within normal limits for age. No significant cerebral white matter disease. Note made of a small focus of encephalomalacia and gliosis at the anterior aspect of the left frontal lobe, likely reflecting a tiny remote cortical infarct (series 11, images 16, 15). This is stable in retrospect as compared to prior exam, somewhat difficult to see on prior study given motion artifact on that study. No abnormal foci of restricted diffusion to suggest acute or subacute ischemia. Gray-white matter differentiation maintained. No  other areas of chronic or interval cortical infarction. No evidence for acute or chronic intracranial hemorrhage. Again seen is a 9 mm well-circumscribed ovoid extramedullary lesion positioned along the left aspect of the medulla, adjacent to the left foramen of Luschka (series 11, image 5). Again, this lesion is seen to demonstrate no appreciable enhancement, and is stable from prior. No associated mass effect. No other mass lesion, midline shift or mass effect. Mild ventricular prominence related to global parenchymal volume loss without hydrocephalus. No extra-axial fluid collection. Stable appearance of the pituitary gland and suprasellar region normal. Midline structures intact. No abnormal enhancement. Vascular: Major intracranial vascular flow voids are maintained. Skull and upper cervical spine: Degenerative thickening noted about the tectorial membrane without significant stenosis. Craniocervical junction otherwise unremarkable. Bone marrow signal intensity remains within normal limits. No scalp soft tissue abnormality. Sinuses/Orbits: Globes and orbital soft tissues demonstrate no acute finding. Mild scattered mucosal thickening noted within the ethmoidal air cells and maxillary sinuses. Superimposed small right maxillary sinus retention cyst. Moderate right with small left mastoid effusions, similar to previous. Visualized nasopharynx unremarkable. Inner ear structures grossly normal. Other: None. IMPRESSION: 1. No acute intracranial abnormality. 2. 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 No associated mass effect. 3. 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 4. Otherwise  normal brain MRI for age. 5. Moderate right with small left mastoid effusions, similar to previous. Electronically Signed   By: Jeannine Boga  M.D.   On: 07/12/2021 01:24   Recent Labs    07/11/21 1056  WBC 5.4  HGB 10.5*  HCT 31.2*  PLT 131*      Recent Labs    07/12/21 0651 07/13/21 0815  NA 136 135  K 3.4* 4.0  CL 106 107  CO2 24 24  GLUCOSE 102* 105*  BUN 15 13  CREATININE 0.97 0.99  CALCIUM 9.2 8.8*      Intake/Output Summary (Last 24 hours) at 07/13/2021 1316 Last data filed at 07/13/2021 1308 Gross per 24 hour  Intake 2225.55 ml  Output 1000 ml  Net 1225.55 ml          Physical Exam: Vital Signs Blood pressure (!) 145/56, pulse (!) 51, temperature 98.1 F (36.7 C), temperature source Oral, resp. rate 18, height 5\' 8"  (1.727 m), weight 62.2 kg, SpO2 100 %.  General: No acute distress Mood and affect are appropriate Heart: Regular rate and rhythm no rubs murmurs or extra sounds Lungs: Clear to auscultation, breathing unlabored, no rales or wheezes Abdomen: Positive bowel sounds, soft nontender to palpation, nondistended Extremities: No clubbing, cyanosis, or edema  Skin: healing abrasions both legs Musc: No edema in extremities.  Minimal LE tenderness Neuro: Oriented to hospital as well as Sunday but is insisted is September 7 rather than the 25th still with STM deficits. Did not appreciate nystagmus today but patient not fully opening eyes. Motor: Bilateral UE 5/5.  LLE 3+/5 prox to 4/5 distally.  RLE 2+ to 3-/5 prox to distal    Assessment/Plan: 1. Functional deficits which require 3+ hours per day of interdisciplinary therapy in a comprehensive inpatient rehab setting. Physiatrist is providing close team supervision and 24 hour management of active medical problems listed below. Physiatrist and rehab team continue to assess barriers to discharge/monitor patient progress toward functional and medical goals  Care Tool:  Bathing    Body parts bathed by patient: Right arm, Left arm, Chest, Abdomen, Front perineal area, Right upper leg, Left upper leg, Face   Body parts bathed by helper:  Right lower leg, Left lower leg, Buttocks     Bathing assist Assist Level: Moderate Assistance - Patient 50 - 74%     Upper Body Dressing/Undressing Upper body dressing   What is the patient wearing?: Pull over shirt    Upper body assist Assist Level: Supervision/Verbal cueing    Lower Body Dressing/Undressing Lower body dressing      What is the patient wearing?: Pants, Incontinence brief     Lower body assist Assist for lower body dressing: Maximal Assistance - Patient 25 - 49%     Toileting Toileting    Toileting assist Assist for toileting: Maximal Assistance - Patient 25 - 49%     Transfers Chair/bed transfer  Transfers assist     Chair/bed transfer assist level: Supervision/Verbal cueing Chair/bed transfer assistive device: Sliding board   Locomotion Ambulation   Ambulation assist      Assist level: 2 helpers Assistive device: Walker-rolling Max distance: 5 ft   Walk 10 feet activity   Assist  Walk 10 feet activity did not occur: Safety/medical concerns        Walk 50 feet activity   Assist Walk 50 feet with 2 turns activity did not occur: Safety/medical concerns         Walk 150 feet  activity   Assist Walk 150 feet activity did not occur: Safety/medical concerns         Walk 10 feet on uneven surface  activity   Assist Walk 10 feet on uneven surfaces activity did not occur: Safety/medical concerns         Wheelchair     Assist Is the patient using a wheelchair?: Yes   Wheelchair activity did not occur: Safety/medical concerns  Wheelchair assist level: Supervision/Verbal cueing Max wheelchair distance: >150 ft    Wheelchair 50 feet with 2 turns activity    Assist    Wheelchair 50 feet with 2 turns activity did not occur: Safety/medical concerns   Assist Level: Supervision/Verbal cueing   Wheelchair 150 feet activity     Assist  Wheelchair 150 feet activity did not occur: Safety/medical  concerns   Assist Level: Supervision/Verbal cueing   Blood pressure (!) 145/56, pulse (!) 51, temperature 98.1 F (36.7 C), temperature source Oral, resp. rate 18, height 5\' 8"  (1.727 m), weight 62.2 kg, SpO2 100 %.  Medical Problem List and Plan: 1.  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  IV acyclovir completed today 06/25/2021  -Continue CIR therapies including PT, OT, and SLP   -adjusted goals to w/c level--may need SNF -9/24-pt with new onset vertigo/nystagmus. Ongoing relative lethargy. -Appreciate neurology input. MRI does not demonstrate any acute findings. 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  Appreciate neurology note -neurosedating meds held -observe for further vertigo,nystagmus 2.  Antithrombotics: -DVT/anticoagulation: [employer]: Other (comment)             -antiplatelet therapy: N/A 3. Neuropathic pain R>L LE: -Lyrica    75mg  TID -continue tramadol as needed -lidoderm patch             9/23 stopped lyrica and tramadol d/t lethargy 4. Mood: Provide emotional support             -antipsychotic agents: N/A 5. Neuropsych: This patient is capable of making decisions on his own behalf. 6. Skin/Wound Care: Routine skin checks 7. Fluids/Electrolytes/Nutrition: I personally reviewed all of the patient's labs today, and lab work is within normal limits.            9/24  -continue IVF until po picks up, fluid intake remains limited on 9/25, 180 mL recorded  -add megace for appetite, caloric intake improving, eating around 75% of meals now  -labs pending 8.  Viral leg rash/?vasculitis.  All lesions fully crusted.  Isolation discontinued  -skin bx --perivascular dermatitis  -pain better  -cbc stable 9.  Hyponatremia.                    9/16 Na+ up to 140 10.  Persistent atrial fibrillation.  Cardiac rate controlled.  Continue Coreg 6.25 mg twice daily/Eliquis             Heart rate controlled 9/24 11.  Diastolic  congestive heart failure.  Ejection fraction 35 to 40% echocardiogram 2020.   Filed Weights   07/11/21 0442 07/12/21 0500 07/13/21 0500  Weight: 62.9 kg 64.5 kg 62.2 kg     Stable on 9/23 12.  Hypertension. Stopped Norvasc. Added clonidine patch right lower extremity for HTN and neuropathic pain  Controlled on 9/24 13.  BPH/mild hydronephrosis.  Continue Flomax 0.4 mg daily. Maintain Foley tube until follow-up outpatient urology.  Patient failed voiding trial 06/21/2021  -9/24 urine remains clear, afebrile. Continue foley care 14.  Hyperlipidemia: Lipitor 15.  L1 superior endplate compression fracture/mild edema with spinous process T4-T5, sacral insufficiency fracture.  Supportive care 16.  5 mm right lower lobe pulmonary nodule versus intrapulmonary lymph node.  Repeat noncontrast CT chest 12 months 17. Hyperkalemia: kayexalate lokelma give so far Potassium 4.8 on 9/16, 4.1 9/19 18. Slow transit constipation, associated nausea  -sorbitol with results  -9/24 check kub today 19. Chronic thrombocytopenia is stable   LOS: 18 days A FACE TO FACE EVALUATION WAS PERFORMED  Charlett Blake 07/13/2021, 1:16 PM

## 2021-07-14 DIAGNOSIS — R4189 Other symptoms and signs involving cognitive functions and awareness: Secondary | ICD-10-CM

## 2021-07-14 LAB — SARS CORONAVIRUS 2 (TAT 6-24 HRS): SARS Coronavirus 2: NEGATIVE

## 2021-07-14 NOTE — Progress Notes (Signed)
Patient ID: Parker [place] Sink., male   DOB: October 01, 1932, 87 y.o.   MRN: [id number]  SW f/u with [facility] Odon[facility]/liaison with [facility] to discuss bed availability. Will f/u with SW.  *reports able to accept pt and will have a bed available for patient. SW will update once updates from insurance.   SW spoke with Parker Odonnell [phone]: [phone]) to request SNF insurance auth. Ref [id number].  *insurance auth submitted.   SW spoke with [name] to inform on above. She is aware SW will f/u once there is an answer from insurance.   Loralee Pacas, MSW, [place] Office: [phone] Cell: [phone] Fax: [phone]

## 2021-07-14 NOTE — Progress Notes (Signed)
Occupational Therapy Session Note  Patient Details  Name: Parker Odonnell. MRN: [id number] Date of Birth: April 27, 1932  Today's Date: 07/14/2021 OT Individual Time: 0907-1000 OT Individual Time Calculation (min): 53 min    Short Term Goals: Week 1:  OT Short Term Goal 1 (Week 1): Patient will perform sit<>stand with max A of 1 and LRAD in preparation for BADL tasks. OT Short Term Goal 1 - Progress (Week 1): Met OT Short Term Goal 2 (Week 1): Patient will perform 1 step of LB dressing task OT Short Term Goal 2 - Progress (Week 1): Met OT Short Term Goal 3 (Week 1): Pt will don shirt with min A OT Short Term Goal 3 - Progress (Week 1): Met  Skilled Therapeutic Interventions/Progress Updates:    Vitals throughout: supine BP- 165/66 Seated in w/c 157/80  Pt received in bed with unrated pain in R leg. Pt with increased "jump" with any touch or w/c bumping into furniture today indicating increased sensitivity. Throughout pt requiores significantly increased rest breaks d/t fatigue and need for increased time to process 1 step commands. ADL: Pt completes ADL at overall MAX A at bed Level. Skilled interventions include: increased time for rest breaks, gentle reorientation to place and situation, skilled monitoring of vitals and nytagmus. Brief rotary nystagmus noted in R sidelying ~10 seconds, visual perceptual task of donning pillow cases with backwards chaining for first pillow and pt able to do second pillow with S. Pt able to complete SB transfer with A to place board and CGA to w/c. Grooming at sink with increased time to reaching far back of sink for electric razor.   Pt left at end of session in w/c with exit alarm on, call light in reach and all needs met   Therapy Documentation Precautions:  Precautions Precautions: Fall Precaution Comments: R>L lower extremity weakness, pt is legally blind, has foley catheter Restrictions Weight Bearing Restrictions: No  Therapy/Group: Individual  Therapy  Tonny Branch 07/14/2021, 6:52 AM
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
## 2021-07-14 NOTE — Progress Notes (Signed)
Occupational Therapy Session Note  Patient Details  Name: Parker Odonnell. MRN: [id number] Date of Birth: Mar 12, 1932  Today's Date: 07/14/2021 OT Individual Time: 1330-1415 OT Individual Time Calculation (min): 45 min    Short Term Goals: Week 2:  OT Short Term Goal 1 (Week 2): Pt will complete sit > stand with LRAD and MOD A in prep for LB dressing OT Short Term Goal 2 (Week 2): Pt will complete transfer to elevated toilet with MOD A OT Short Term Goal 3 (Week 2): Pt will thread BLE through pants with A only for catheter mgmt OT Short Term Goal 4 (Week 2): Pt will complete donning of socks/shoes with MOD A  Skilled Therapeutic Interventions/Progress Updates:    Patient seated in w/c, alert and denies pain at this time.  Sliding board transfer w/c to edge of bed with min A and min cues for hand placement and sequencing.  Able to tolerate unsupported sitting for 20 minutes with trunk/core, reaching exercises, donned clean shirt with set up, completed sit to stand from edge of bed x 3 with min A and RW.  Sit to supine with min A.  Completed bed level bridging, leg and arm exercises.  He remained in bed at close of session, bed alarm set and call bell in hand.    Therapy Documentation Precautions:  Precautions Precautions: Fall Precaution Comments: R>L lower extremity weakness, pt is legally blind, has foley catheter Restrictions Weight Bearing Restrictions: No   Therapy/Group: Individual Therapy  Carlos Levering 07/14/2021, 7:42 AM

## 2021-07-14 NOTE — Progress Notes (Signed)
Nutrition Follow-up  DOCUMENTATION CODES:   Non-severe (moderate) malnutrition in context of chronic illness  INTERVENTION:  Continue Ensure Enlive po TID, each supplement provides 350 kcal and 20 grams of protein   Encourage adequate PO intake.   NUTRITION DIAGNOSIS:   Moderate Malnutrition related to chronic illness (CHF) as evidenced by moderate fat depletion, moderate muscle depletion; ongoing  GOAL:   Patient will meet greater than or equal to 90% of their needs; progressing  MONITOR:   PO intake, Supplement acceptance, Labs, Weight trends, Skin, I & O's  REASON FOR ASSESSMENT:   Malnutrition Screening Tool    ASSESSMENT:   87 year old with history of CAD, legally blind, diastolic congestive heart failure, hyperlipidemia, as well as left hip replacement. He presented on 06/10/2021 with right leg pain since 05/30/21 as well as recent fall. bilateral lower extremity weakness right more than left/purpuric right lower extremity rash. Given lymphocytic pleocytosis and elevated protein on CSF, VZV PCR now positive. Patient decreased functional as well as cognitive ability was admitted for a comprehensive rehab program.  Meal completion has been varied from 10-85%. Pt tolerating his PO diet. Pt current has Ensure ordered and has been consuming them. Will continue with current orders to aid in caloric and protein needs.   Labs and medications reviewed.   Diet Order:   Diet Order             Diet regular Room service appropriate? No; Fluid consistency: Thin  Diet effective now                   EDUCATION NEEDS:   Not appropriate for education at this time  Skin:  Skin Assessment: Reviewed RN Assessment Skin Integrity Issues:: Stage II Stage II: N/A  Last BM:  9/26  Height:   Ht Readings from Last 1 Encounters:  06/25/21 5\' 8"  (1.727 m)    Weight:   Wt Readings from Last 1 Encounters:  07/14/21 62.6 kg   BMI:  Body mass index is 20.98 kg/m.  Estimated  Nutritional Needs:   Kcal:  1750-1900  Protein:  80-90 grams  Fluid:  >/= 1.7 L/day  Corrin Parker, MS, RD, LDN RD pager number/after hours weekend pager number on Am[facility].

## 2021-07-14 NOTE — Progress Notes (Signed)
Speech Language Pathology Daily Session Note  Patient Details  Name: Parker Odonnell. MRN: [id number] Date of Birth: December 26, 1931  Today's Date: 07/14/2021 SLP Individual Time: 1015-1045 SLP Individual Time Calculation (min): 30 min  Short Term Goals: Week 3: SLP Short Term Goal 1 (Week 3): STGs=LTGs due to ELOS  Skilled Therapeutic Interventions:   Pt was seen sitting upright in WC with chair alarm in place. ST treatment focused on cognition this session. Pt was pleasant and alert. Pt was not oriented to his age (78), time of day, DOW, date, month (April), and place (gov't building). Using spaced retrieval, pt was able to recall month post 8+ min delay. At the end of session, pt was able to recall hospital name with [facility]. SLP and pt spoke about the importance of "knowing what time it is, where we are" etc. Pt in agreement. Reoriented pt to external memory aids in room (on wall). Pt left in room with chair alarm in place; all immediate needs in reach. ST cont with current POC.   Pain Pain Assessment Pain Scale: PAINAD Pain Score: 0-No pain PAINAD (Pain Assessment in Advanced Dementia) Breathing: normal Negative Vocalization: occasional moan/groan, low speech, negative/disapproving quality Facial Expression: facial grimacing Body Language: relaxed Consolability: distracted or reassured by voice/touch PAINAD Score: 4  Therapy/Group: Individual Therapy  Verdene Lennert MS, CCC-SLP, CBIS  07/14/2021, 12:37 PM

## 2021-07-14 NOTE — Progress Notes (Signed)
PROGRESS NOTE   Subjective/Complaints:  Pt up in bed. Feels better this morning but not sure he's up for getting out of bed even though he wants to. Denies dizziness  ROS: limited d/t cognition   Objective:   DG Abd 1 View  Result Date: 07/12/2021 CLINICAL DATA:  Possible obstipation. EXAM: ABDOMEN 2 VIEW COMPARISON:  CT, 06/14/2021. FINDINGS: Normal bowel gas pattern. No significant increase in colonic stool burden. No evidence of renal or ureteral stones. Status post cholecystectomy and left total hip arthroplasty stable from the prior exam. No acute skeletal abnormality. IMPRESSION: 1. No acute findings.  No increase in the colonic stool burden. Electronically Signed   By: Lajean Manes M.D.   On: 07/12/2021 10:12   Recent Labs    07/11/21 1056  WBC 5.4  HGB 10.5*  HCT 31.2*  PLT 131*     Recent Labs    07/12/21 0651 07/13/21 0815  NA 136 135  K 3.4* 4.0  CL 106 107  CO2 24 24  GLUCOSE 102* 105*  BUN 15 13  CREATININE 0.97 0.99  CALCIUM 9.2 8.8*     Intake/Output Summary (Last 24 hours) at 07/14/2021 1003 Last data filed at 07/14/2021 0744 Gross per 24 hour  Intake 1671.57 ml  Output 851 ml  Net 820.57 ml         Physical Exam: Vital Signs Blood pressure (!) 138/58, pulse 67, temperature 97.9 F (36.6 C), temperature source Oral, resp. rate 20, height 5\' 8"  (1.727 m), weight 62.6 kg, SpO2 100 %.  Constitutional: No distress . Vital signs reviewed. HEENT: NCAT, EOMI, oral membranes moist Neck: supple Cardiovascular: RRR without murmur. No JVD    Respiratory/Chest: CTA Bilaterally without wheezes or rales. Normal effort    GI/Abdomen: BS +, non-tender, non-distended Ext: no clubbing, cyanosis, or edema Psych: pleasant and cooperative  Skin: healing abrasions both legs Musc: No edema in extremities.  Minimal LE tenderness Neuro: Oriented to hospital and person. Follows basic commands. More alert  and makes eye contact. STM with limited insight and awareness.  Did not appreciate nystagmus today. Motor: Bilateral UE 5/5.  LLE 3+/5 prox to 4/5 distally.  RLE 2+ to 3-/5 prox to distal    Assessment/Plan: 1. Functional deficits which require 3+ hours per day of interdisciplinary therapy in a comprehensive inpatient rehab setting. Physiatrist is providing close team supervision and 24 hour management of active medical problems listed below. Physiatrist and rehab team continue to assess barriers to discharge/monitor patient progress toward functional and medical goals  Care Tool:  Bathing    Body parts bathed by patient: Right arm, Left arm, Chest, Abdomen, Front perineal area, Right upper leg, Left upper leg, Face   Body parts bathed by helper: Right lower leg, Left lower leg, Buttocks     Bathing assist Assist Level: Moderate Assistance - Patient 50 - 74%     Upper Body Dressing/Undressing Upper body dressing   What is the patient wearing?: Pull over shirt    Upper body assist Assist Level: Supervision/Verbal cueing    Lower Body Dressing/Undressing Lower body dressing      What is the patient wearing?: Pants, Incontinence brief  Lower body assist Assist for lower body dressing: Maximal Assistance - Patient 25 - 49%     Toileting Toileting    Toileting assist Assist for toileting: Maximal Assistance - Patient 25 - 49%     Transfers Chair/bed transfer  Transfers assist     Chair/bed transfer assist level: Supervision/Verbal cueing Chair/bed transfer assistive device: Sliding board   Locomotion Ambulation   Ambulation assist      Assist level: 2 helpers Assistive device: Walker-rolling Max distance: 5 ft   Walk 10 feet activity   Assist  Walk 10 feet activity did not occur: Safety/medical concerns        Walk 50 feet activity   Assist Walk 50 feet with 2 turns activity did not occur: Safety/medical concerns         Walk 150 feet  activity   Assist Walk 150 feet activity did not occur: Safety/medical concerns         Walk 10 feet on uneven surface  activity   Assist Walk 10 feet on uneven surfaces activity did not occur: Safety/medical concerns         Wheelchair     Assist Is the patient using a wheelchair?: Yes   Wheelchair activity did not occur: Safety/medical concerns  Wheelchair assist level: Supervision/Verbal cueing Max wheelchair distance: >150 ft    Wheelchair 50 feet with 2 turns activity    Assist    Wheelchair 50 feet with 2 turns activity did not occur: Safety/medical concerns   Assist Level: Supervision/Verbal cueing   Wheelchair 150 feet activity     Assist  Wheelchair 150 feet activity did not occur: Safety/medical concerns   Assist Level: Supervision/Verbal cueing   Blood pressure (!) 138/58, pulse 67, temperature 97.9 F (36.6 C), temperature source Oral, resp. rate 20, height 5\' 8"  (1.727 m), weight 62.6 kg, SpO2 100 %.  Medical Problem List and Plan: 1.  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  IV acyclovir completed today 06/25/2021  -Continue CIR therapies including PT, OT, and SLP   -adjusted goals to w/c level--may need SNF -9/26-pt with vertigo/nystagmus seen on 9/23. Ongoing relative lethargy. -Appreciate neurology input. MRI does not demonstrate any acute findings. 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  Appreciate neurology note -neurosedating meds held -presentation likely a combination of factors related to his age, chronic changes of brain, etc -pt is medically stable for SNF placement 2.  Antithrombotics: -DVT/anticoagulation: [employer]: Other (comment)             -antiplatelet therapy: N/A 3. Neuropathic pain R>L LE: -Lyrica    75mg  TID -continue tramadol as needed -lidoderm patch             9/23 stopped lyrica and tramadol d/t lethargy 4. Mood: Provide emotional support              -antipsychotic agents: N/A 5. Neuropsych: This patient is capable of making decisions on his own behalf. 6. Skin/Wound Care: Routine skin checks 7. Fluids/Electrolytes/Nutrition: I personally reviewed all of the patient's labs today, and lab work is within normal limits.             -added megace for appetite, caloric intake improving but inconsistent  -9/26 dc IVF    8.  Viral leg rash/?vasculitis.  All lesions fully crusted.  Isolation discontinued  -skin bx --perivascular dermatitis  -pain better  -cbc stable 9.  Hyponatremia.  9/16 Na+ up to 140 10.  Persistent atrial fibrillation.  Cardiac rate controlled.  Continue Coreg 6.25 mg twice daily/Eliquis             Heart rate controlled 9/24 11.  Diastolic congestive heart failure.  Ejection fraction 35 to 40% echocardiogram 2020.   Filed Weights   07/12/21 0500 07/13/21 0500 07/14/21 0432  Weight: 64.5 kg 62.2 kg 62.6 kg    Stable on 9/23 12.  Hypertension. Stopped Norvasc. Added clonidine patch right lower extremity for HTN and neuropathic pain  Controlled on 9/26 13.  BPH/mild hydronephrosis.  Continue Flomax 0.4 mg daily. Maintain Foley tube until follow-up outpatient urology.  Patient failed voiding trial 06/21/2021  -9/24 urine remains clear, afebrile. Continue foley care 14.  Hyperlipidemia: Lipitor 15.  L1 superior endplate compression fracture/mild edema with spinous process T4-T5, sacral insufficiency fracture.  Supportive care 16.  5 mm right lower lobe pulmonary nodule versus intrapulmonary lymph node.  Repeat noncontrast CT chest 12 months 17. Hyperkalemia: kayexalate lokelma give so far Potassium 4.8 on 9/16, 4.1 9/19 18. Slow transit constipation, associated nausea  -sorbitol with results  -9/24   kub with no significant retained stool 19. Chronic thrombocytopenia is stable   LOS: 19 days A FACE TO FACE EVALUATION WAS PERFORMED  Meredith Staggers 07/14/2021, 10:04 AM

## 2021-07-14 NOTE — Progress Notes (Signed)
Physical Therapy Session Note  Patient Details  Name: Parker Odonnell. MRN: [id number] Date of Birth: 1931/10/31  Today's Date: 07/14/2021 PT Individual Time: 1105-1155 and 1310-1335 PT Individual Time Calculation (min): 50 min and 30 min  Short Term Goals: Week 3:  PT Short Term Goal 1 (Week 3): STGs=LTGs due to ELOS  Skilled Therapeutic Interventions/Progress Updates:     Session 1: Patient in w/c in the room upon PT arrival. Patient alert and agreeable to PT session. Patient reported 5/10 R lower extremity pain during session, RN made aware. PT provided repositioning, rest breaks, and distraction as pain interventions throughout session.   Patient reports dizziness with mobility throughout session. Notable B multidirectional nystagmus. Completed vestibular testing with inconclusive results. Symptoms most consistent with central vestibular impairment.    Therapeutic Activity: Bed Mobility: Patient performed supine to/from sit with mod-max A +2 during side-lying test R/L.  Transfers: Patient performed sit to/from stand x1 with min-mod A using RW with PT blocking R knee throughout. Provided verbal cues for initiation and forward weight shift. Patient performed slide board transfer w/c<>bed with min A and total for board placement. Provided cues for hand placement, board placement, and head-hips relationship for proper technique and decreased assist with transfers. On first transfer patient became dizzy and began sliding forward on the board, required total A to complete transfer due to dizziness.   Vestibular testing: Side-lying test on L: + for symptoms and multidirectional nystagmus >1 min  Side-lying on R: + for symptoms and mild multidirectional nystagmus >1 min Horizontal canal testing: + for symptoms no nystagmus Most prominent nystagmus with rolling R during mobility.   Patient in w/c in the room requesting to rest due to feeling fatigue and "woozy" from vestibular testing at end  of session with breaks locked, seat belt alarm set, and all needs within reach. Patient missed 10 min of skilled PT due to fatigue/feeling ill, RN made aware. Will attempt to make-up missed time as able.    Session 2: Patient in bed upon PT arrival. Patient alert and agreeable to PT session. Patient reported "moderate" B lower extremity pain R>L during session, RN made aware. PT provided repositioning, rest breaks, and distraction as pain interventions throughout session.   Patient denied dizziness and without visible nystagmus throughout session.   Therapeutic Activity: Bed Mobility: Patient performed supine to/from sit with supervision and increased time in a flat bed with use of bed rails. Provided verbal cues for rolling through side-lying to reduce strain/effort with mobility. Donned tennis shoes EOB with total A for energy/time management. Transfers: Patient performed sit to/from stand x2 with mod progressing to min A using RW with PT facilitating R knee extension. Provided verbal cues for initiation.  In standing on second trial patient spontaneously started stepping forward, stepped with R well, but when shifting to step with L foot, R knee buckled and PT provided total A to return patient back to the bed for safety. Patient reported no injury or pain after. Asked patient what happened and he stated "I was just going to walk over and close the door." Asked patient if he has been walking on his own recently. Patient replied, "of course" demonstrating poor awareness of deficits and recall. Reminded patient of slide board transfers and w/c mobility and patient agreed that he had been doing these things.   Discussed d/c planning and recommendation for SNF placement. Patient appeared to be attending to education, however, after, stated, "I was thinking I maybe should not  be going back to work when I get home." Oriented patient to time, place, and situation. Patient without acknowledgement of  incorrect time-line. Patient declined attempts at standing and requesting to lie down.   Patient in bed at end of session with breaks locked, bed alarm set, and all needs within reach.   Therapy Documentation Precautions:  Precautions Precautions: Fall Precaution Comments: R>L lower extremity weakness, pt is legally blind, has foley catheter Restrictions Weight Bearing Restrictions: No General: PT Amount of Missed Time (min): 10 Minutes PT Missed Treatment Reason: Patient fatigue;Patient ill (Comment) (nausea/dizziness)    Therapy/Group: Individual Therapy  Nahomi Hegner L Ziyonna Christner PT, DPT  07/14/2021, 12:19 PM

## 2021-07-15 LAB — BASIC METABOLIC PANEL
Anion gap: 5 (ref 5–15)
BUN: 9 mg/dL (ref 8–23)
CO2: 22 mmol/L (ref 22–32)
Calcium: 8.8 mg/dL — ABNORMAL LOW (ref 8.9–10.3)
Chloride: 109 mmol/L (ref 98–111)
Creatinine, Ser: 0.94 mg/dL (ref 0.61–1.24)
GFR, Estimated: >60 mL/min (ref >60)
Glucose, Bld: 103 mg/dL — ABNORMAL HIGH (ref 70–99)
Potassium: 4.2 mmol/L (ref 3.5–5.1)
Sodium: 136 mmol/L (ref 135–145)

## 2021-07-15 LAB — CBC
HCT: 31.1 % — ABNORMAL LOW (ref 39.0–52.0)
Hemoglobin: 10.6 g/dL — ABNORMAL LOW (ref 13.0–17.0)
MCH: 34.2 pg — ABNORMAL HIGH (ref 26.0–34.0)
MCHC: 34.1 g/dL (ref 30.0–36.0)
MCV: 100.3 fL — ABNORMAL HIGH (ref 80.0–100.0)
Platelets: 125 10*3/uL — ABNORMAL LOW (ref 150–400)
RBC: 3.1 MIL/uL — ABNORMAL LOW (ref 4.22–5.81)
RDW: 14.7 % (ref 11.5–15.5)
WBC: 4.7 10*3/uL (ref 4.0–10.5)
nRBC: 0 % (ref 0.0–0.2)

## 2021-07-15 NOTE — Progress Notes (Signed)
Patient ID: Parker Sink., male   DOB: 12-12-1931, 87 y.o.   MRN: [id number]  SW received message from [place] reporting that medical director was requesting a peer to peer. MD must call [phone] option #5 by 11am to complete. SW updated attending.  SW returned phone call to [name]. Her sister Parker Odonnell present as well when discussing SNF placement process if approved, and if process if denied. SW informed ultimate plan would be for pt to go home with therapy, if family appeal is denied. SW informed that a peer to peer was requested and will update once there is an answer.   SW received message from [facility] reporting that pt P2P was completed, and after review, their medical director denied SNF stating pt did not meet criteria under chapter 8. Reports family can complete a fast appeal by calling [phone] or use fax [phone].  *SW called [name] to inform on above. Dtr reports she will complete fast appeal. Dtr concerned insurance may deny appeal. SW reiterated that an appeal is best, and plans will be made once there is a decision from insurance.   Loralee Pacas, MSW, [place] Office: [phone] Cell: [phone] Fax: [phone]

## 2021-07-15 NOTE — Progress Notes (Signed)
Speech Language Pathology Daily Session Note  Patient Details  Name: Parker Odonnell. MRN: [id number] Date of Birth: 1932-02-14  Today's Date: 07/15/2021 SLP Individual Time: 1100-1130 SLP Individual Time Calculation (min): 30 min  Short Term Goals: Week 3: SLP Short Term Goal 1 (Week 3): STGs=LTGs due to ELOS  Skilled Therapeutic Interventions: Skilled treatment session focused on cognitive goals. Upon arrival, patient awake and upright in the wheelchair. SLP facilitated session by providing Mod A verbal and visual cues for use of external aids for orientation to place, time and situation. Patient appeared confused throughout session and was unaware that he was in his room. SLP provided additional external aids and laminated them so that the patient can utilize them independently. Overall, patient was more awake, alert and socially engaged today. Patient left upright in wheelchair with alarm on and all needs within reach.      Pain No/Denies Pain   Therapy/Group: Individual Therapy  Parker Odonnell 07/15/2021, 3:34 PM

## 2021-07-15 NOTE — Progress Notes (Signed)
Physical Therapy Session Note  Patient Details  Name: Parker Odonnell. MRN: [id number] Date of Birth: 1932/06/15  Today's Date: 07/15/2021 PT Individual Time: 0900-1000 PT Individual Time Calculation (min): 60 min   Short Term Goals: Week 3:  PT Short Term Goal 1 (Week 3): STGs=LTGs due to ELOS  Skilled Therapeutic Interventions/Progress Updates:    Pt received seated in w/c in room, agreeable to PT session. No complaints of pain. Slide board transfer w/c to/from mat table with min A, increased time and cues needed to complete transfer safely. Pt reports increase in dizziness while seated on mat table even with attempts to find object to focus on, requests to lay down. Sit to supine mod A needed for BLE management. No improvement in symptoms in supine position, pt agreeable to sit back up. Supine to sit with max A needed for BLE management and trunk elevation, max encouragement needed for pt to assist with mobility. Pt with noticeable anxiety throughout session and requires increased time between tasks. Pt agreeable to continue participation in therapy session. Seated BLE strengthening on Kinetron level 20 cm/sec, 4 x 30 sec hip extension. Pt returned to room at end of session, left semi-reclined in TIS chair with needs in reach, quick release belt and chair alarm in place.  Therapy Documentation Precautions:  Precautions Precautions: Fall Precaution Comments: R>L lower extremity weakness, pt is legally blind, has foley catheter Restrictions Weight Bearing Restrictions: No     Therapy/Group: Individual Therapy   Excell Seltzer, PT, DPT, CSRS  07/15/2021, 11:25 AM

## 2021-07-15 NOTE — Plan of Care (Signed)
  Problem: RH Cognition - SLP Goal: RH LTG Patient will demonstrate orientation with cues Description:  LTG:  Patient will demonstrate orientation to person/place/time/situation with cues (SLP)   07/15/2021 1603 by Buzzy Han, CCC-SLP Flowsheets (Taken 07/15/2021 1603) LTG: Patient will demonstrate orientation using cueing (SLP): Moderate Assistance - Patient 50 - 74% Note: Goal downgraded due to change in function  07/15/2021 1603 by Buzzy Han, CCC-SLP Reactivated 07/15/2021 1559 by Buzzy Han, CCC-SLP Outcome: Not Applicable Flowsheets (Taken 07/15/2021 1559) LTG Patient will demonstrate orientation to:  Place  Time  Situation LTG: Patient will demonstrate orientation using cueing (SLP): Moderate Assistance - Patient 50 - 74% Note: Goal downgraded due to change in function    Problem: RH Memory Goal: LTG Patient will use memory compensatory aids to (SLP) Description: LTG:  Patient will use memory compensatory aids to recall biographical/new, daily complex information with cues (SLP) Outcome: Not Applicable Note: Goal discharged due to change in function    Problem: RH Awareness Goal: LTG: Patient will demonstrate awareness during functional activites type of (SLP) Description: LTG: Patient will demonstrate awareness during functional activites type of (SLP) Outcome: Not Applicable Note: Goal discharged due to change in function    Problem: RH Problem Solving Goal: LTG Patient will demonstrate problem solving for (SLP) Description: LTG:  Patient will demonstrate problem solving for basic/complex daily situations with cues  (SLP) Outcome: Not Applicable Note: Goal discharged due to decline in overall function

## 2021-07-15 NOTE — Progress Notes (Signed)
Occupational Therapy Session Note  Patient Details  Name: Parker Odonnell. MRN: [id number] Date of Birth: Jan 25, 1932  Today's Date: 07/15/2021 OT Individual Time: 3748-2707 OT Individual Time Calculation (min): 61 min    Short Term Goals: Week 2:  OT Short Term Goal 1 (Week 2): Pt will complete sit > stand with LRAD and MOD A in prep for LB dressing OT Short Term Goal 2 (Week 2): Pt will complete transfer to elevated toilet with MOD A OT Short Term Goal 3 (Week 2): Pt will thread BLE through pants with A only for catheter mgmt OT Short Term Goal 4 (Week 2): Pt will complete donning of socks/shoes with MOD A  Skilled Therapeutic Interventions/Progress Updates:    Patient in bed, wakes with auditory stimulation.  He c/o stiffness and occ pain with transitional movement.   He notes that he feels like he needs to have a BM.  Supine to sitting at edge of bed with min A and increased time.  Sit to stand utilizing stedy due to difficulty with accessing bathroom with CGA.  Max A to pull down brief.  He was not able to void at this time.  Dependent for hygiene and donning clean brief.  Stedy with CGA to w/c surface.  He was able to complete LB bathing with mod A.  LB dressing max A.  UB bathing and dressing set up/CS.  Oral care and shaving with electric razor set up, min cues and increased time.  Hair care max A.  Sit to stand from w/c min a. He remained seated in w/c at close of session with nursing care, PT to start momentarily.  Therapy Documentation Precautions:  Precautions Precautions: Fall Precaution Comments: R>L lower extremity weakness, pt is legally blind, has foley catheter Restrictions Weight Bearing Restrictions: No  Therapy/Group: Individual Therapy  Parker Odonnell 07/15/2021, 7:20 AM

## 2021-07-15 NOTE — Progress Notes (Signed)
PROGRESS NOTE   Subjective/Complaints: Up with OT. Just got dressed. Needed some time to get going but was able to work through task with therapist.   ROS: Patient denies fever, rash, sore throat, blurred vision, nausea, vomiting, diarrhea, cough, shortness of breath or chest pain, joint or back pain, headache, or mood change.   Objective:   No results found. Recent Labs    07/15/21 0608  WBC 4.7  HGB 10.6*  HCT 31.1*  PLT 125*     Recent Labs    07/13/21 0815 07/15/21 0608  NA 135 136  K 4.0 4.2  CL 107 109  CO2 24 22  GLUCOSE 105* 103*  BUN 13 9  CREATININE 0.99 0.94  CALCIUM 8.8* 8.8*     Intake/Output Summary (Last 24 hours) at 07/15/2021 0950 Last data filed at 07/15/2021 0700 Gross per 24 hour  Intake 540 ml  Output 1250 ml  Net -710 ml         Physical Exam: Vital Signs Blood pressure (!) 165/64, pulse (!) 57, temperature 98.2 F (36.8 C), resp. rate 16, height 5\' 8"  (1.727 m), weight 61.6 kg, SpO2 98 %.  Constitutional: No distress . Vital signs reviewed. HEENT: NCAT, EOMI, oral membranes moist Neck: supple Cardiovascular: RRR without murmur. No JVD    Respiratory/Chest: CTA Bilaterally without wheezes or rales. Normal effort    GI/Abdomen: BS +, non-tender, non-distended Ext: no clubbing, cyanosis, or edema Psych: pleasant and cooperative  Skin: healing abrasions both legs Musc: No edema in extremities.  Minimal LE tenderness Neuro: Oriented to hospital and person. Follows basic commands. More alert and makes eye contact. STM with limited insight and awareness. Did not see nystagmus today Motor: Bilateral UE 5/5.  LLE 3+/5 prox to 4/5 distally.  RLE 2+ to 3-/5 prox to distal    Assessment/Plan: 1. Functional deficits which require 3+ hours per day of interdisciplinary therapy in a comprehensive inpatient rehab setting. Physiatrist is providing close team supervision and 24 hour  management of active medical problems listed below. Physiatrist and rehab team continue to assess barriers to discharge/monitor patient progress toward functional and medical goals  Care Tool:  Bathing    Body parts bathed by patient: Right arm, Left arm, Chest, Abdomen, Front perineal area, Right upper leg, Left upper leg, Face   Body parts bathed by helper: Right lower leg, Left lower leg, Buttocks     Bathing assist Assist Level: Moderate Assistance - Patient 50 - 74%     Upper Body Dressing/Undressing Upper body dressing   What is the patient wearing?: Pull over shirt    Upper body assist Assist Level: Supervision/Verbal cueing    Lower Body Dressing/Undressing Lower body dressing      What is the patient wearing?: Pants, Incontinence brief     Lower body assist Assist for lower body dressing: Maximal Assistance - Patient 25 - 49%     Toileting Toileting    Toileting assist Assist for toileting: Maximal Assistance - Patient 25 - 49%     Transfers Chair/bed transfer  Transfers assist     Chair/bed transfer assist level: Supervision/Verbal cueing Chair/bed transfer assistive device: Sliding board  Locomotion Ambulation   Ambulation assist      Assist level: 2 helpers Assistive device: Walker-rolling Max distance: 5 ft   Walk 10 feet activity   Assist  Walk 10 feet activity did not occur: Safety/medical concerns        Walk 50 feet activity   Assist Walk 50 feet with 2 turns activity did not occur: Safety/medical concerns         Walk 150 feet activity   Assist Walk 150 feet activity did not occur: Safety/medical concerns         Walk 10 feet on uneven surface  activity   Assist Walk 10 feet on uneven surfaces activity did not occur: Safety/medical concerns         Wheelchair     Assist Is the patient using a wheelchair?: Yes   Wheelchair activity did not occur: Safety/medical concerns  Wheelchair assist level:  Supervision/Verbal cueing Max wheelchair distance: >150 ft    Wheelchair 50 feet with 2 turns activity    Assist    Wheelchair 50 feet with 2 turns activity did not occur: Safety/medical concerns   Assist Level: Supervision/Verbal cueing   Wheelchair 150 feet activity     Assist  Wheelchair 150 feet activity did not occur: Safety/medical concerns   Assist Level: Supervision/Verbal cueing   Blood pressure (!) 165/64, pulse (!) 57, temperature 98.2 F (36.8 C), resp. rate 16, height 5\' 8"  (1.727 m), weight 61.6 kg, SpO2 98 %.  Medical Problem List and Plan: 1.  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  IV acyclovir completed today 06/25/2021  --Continue CIR therapies including PT, OT, and SLP. Interdisciplinary team conference today to discuss goals, barriers to discharge, and dc planning.   -9/27-pt with vertigo/nystagmus seen on 9/23. Ongoing relative lethargy. -Appreciate neurology input. MRI does not demonstrate any acute findings. 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  Appreciate neurology note -neurosedating meds held--appears more alert -presentation likely a combination of factors related to his age, chronic changes of brain, etc -pt is medically stable for SNF placement 2.  Antithrombotics: -DVT/anticoagulation: [employer]: Other (comment)             -antiplatelet therapy: N/A 3. Neuropathic pain R>L LE: -Lyrica    75mg  TID -continue tramadol as needed -lidoderm patch             - stopped lyrica and tramadol d/t lethargy 4. Mood: Provide emotional support             -antipsychotic agents: N/A 5. Neuropsych: This patient is capable of making decisions on his own behalf. 6. Skin/Wound Care: Routine skin checks 7. Fluids/Electrolytes/Nutrition: I personally reviewed all of the patient's labs today, and lab work is within normal limits.             -added megace for appetite, caloric intake improving but  inconsistent  -he is now off IVF    8.  Viral leg rash/?vasculitis.  All lesions fully crusted.  Isolation discontinued  -skin bx --perivascular dermatitis  -pain better  -cbc stable 9.  Hyponatremia.                    9/16 Na+ up to 140 10.  Persistent atrial fibrillation.  Cardiac rate controlled.  Continue Coreg 6.25 mg twice daily/Eliquis             Heart rate controlled 9/27 11.  Diastolic congestive heart failure.  Ejection fraction 35 to 40%  echocardiogram 2020.   Filed Weights   07/13/21 0500 07/14/21 0432 07/15/21 0500  Weight: 62.2 kg 62.6 kg 61.6 kg    Stable on 9/23 12.  Hypertension. Stopped Norvasc. Added clonidine patch right lower extremity for HTN and neuropathic pain  Controlled on 9/27 13.  BPH/mild hydronephrosis.  Continue Flomax 0.4 mg daily. Maintain Foley tube until follow-up outpatient urology.  Patient failed voiding trial 06/21/2021  -9/24 urine remains clear, afebrile. Continue foley care 14.  Hyperlipidemia: Lipitor 15.  L1 superior endplate compression fracture/mild edema with spinous process T4-T5, sacral insufficiency fracture.  Supportive care 16.  5 mm right lower lobe pulmonary nodule versus intrapulmonary lymph -node.  Repeat noncontrast CT chest 12 months 17. Hyperkalemia: kayexalate lokelma give so far Potassium 4.8 on 9/16, 4.1 9/19 18. Slow transit constipation, associated nausea  -sorbitol with results  -9/24   kub with no significant retained stool 19. Chronic thrombocytopenia is stable   LOS: 20 days A FACE TO FACE EVALUATION WAS PERFORMED  Meredith Staggers 07/15/2021, 9:50 AM

## 2021-07-15 NOTE — Progress Notes (Signed)
Physical Therapy Session Note  Patient Details  Name: Parker Odonnell. MRN: [id number] Date of Birth: 10/02/1932  Today's Date: 07/15/2021 PT Missed Time: 67 Minutes Missed Time Reason: Patient unwilling to participate  Short Term Goals: Week 1:  PT Short Term Goal 1 (Week 1): Patient will perform bed mobility with supervision using hospital bed features. PT Short Term Goal 1 - Progress (Week 1): Met PT Short Term Goal 2 (Week 1): Patient will perform basic transfers with min A consistently. PT Short Term Goal 2 - Progress (Week 1): Met PT Short Term Goal 3 (Week 1): Patient will ambulate >10 feet with +2 assist. PT Short Term Goal 3 - Progress (Week 1): Met Week 2:  PT Short Term Goal 1 (Week 2): Patient will perform stand pivot transfers with mod A from 1 person using LRAD. PT Short Term Goal 1 - Progress (Week 2): Not progressing PT Short Term Goal 2 (Week 2): Patient will ambulate >20 feet using LRAD with mod A of 1 person. PT Short Term Goal 2 - Progress (Week 2): Not progressing PT Short Term Goal 3 (Week 2): Patient will tolerate standing balance with min A >3 min. PT Short Term Goal 3 - Progress (Week 2): Progressing toward goal Week 3:  PT Short Term Goal 1 (Week 3): STGs=LTGs due to ELOS  Skilled Therapeutic Interventions/Progress Updates:    Pt seated in w/c on arrival with his son present. Pt states he has had a full day and would like to rest now. Therapist offered positioning change, toileting, etc., pt refused. Pt remained in w/c with his son present and missed 30 min scheduled time.   Therapy Documentation Precautions:  Precautions Precautions: Fall Precaution Comments: R>L lower extremity weakness, pt is legally blind, has foley catheter Restrictions Weight Bearing Restrictions: No General: PT Amount of Missed Time (min): 30 Minutes PT Missed Treatment Reason: Patient unwilling to participate     Therapy/Group: Individual Therapy  Mickel Fuchs 07/15/2021, 3:32 PM

## 2021-07-15 NOTE — Patient Care Conference (Signed)
Inpatient RehabilitationTeam Conference and Plan of Care Update Date: 07/15/2021   Time: 10:12 AM    Patient Name: Parker Odonnell.      Medical Record Number: [id number]  Date of Birth: 1932-04-20 Sex: Male         Room/Bed: 9D35T/0V77L-39 Payor Info: Payor: [employer] / Plan: [employer] MEDICARE / Product Type: *No Product type* /    Admit Date/Time:  06/25/2021  5:42 PM  Primary Diagnosis:  VZV (varicella-zoster virus) infection  Hospital Problems: Principal Problem:   VZV (varicella-zoster virus) infection Active Problems:   Pressure injury of skin   Hyperkalemia   Hyponatremia   Neuropathic pain   Malnutrition of moderate degree   Cognitive retention disorder    Expected Discharge Date: Expected Discharge Date:  (SNF)  Team Members Present: Physician leading conference: Dr. Alger Simons Social Worker Present: Loralee Pacas, [place] Nurse Present: Dorthula Nettles, RN PT Present: Tereasa Coop, PT OT Present: Elisabeth Most, OT SLP Present: Weston Anna, SLP PPS Coordinator present : Gunnar Fusi, SLP     Current Status/Progress Goal Weekly Team Focus  Bowel/Bladder   Incont of Bowel, Foley cath. in place.  F/U with urology after discharge . Regain cont. of bowel  assess q shift and PRN(2-3 hours)   Swallow/Nutrition/ Hydration             ADL's   UB ADL S-min; LB MAX A, SB transfer/squat pivot to BSC with min-mod. STS MAX- total of 1-2, increased confusion and nystagmus presenting this reporting period  min-mod A (downgraded)  transfer training, ADL retraining, family training, conditioning, sit to stand, desesitization   Mobility   Min A-supervision bed mobility and slide-board transfers, mod-min sit to stand with RW, supervision w/c mobility >150 ft, new onset intermittent dizziness with nystagmus  Min A-supervision w/c level  Activity tolerance, vestibular accomodation, balance, funtional transfers, R LE NMR, patient/caregiver education    Communication             Safety/Cognition/ Behavioral Observations  Mod-Max A  Min A  use of external aids for orientaiton, intellectual awareness   Pain   painad 4  pain<3  assess pain q shift and PRN   Skin   MASD to scrotum, sacrum. TX in progress  no new breakdown  assess skin q shift and PRN. Encourage T&P q 2 hours.     Discharge Planning:  SNF placement pending insurance approval.   Team Discussion: Neuro consulted, small cyst on brain. Declined some over there weekend. Ready for discharge medically. Foley in place, will follow up with urology after discharge. Incontinent of bowel, LBM 9/27. No to minimal pain reported, Tylenol available. Skin CDI. Nursing educating on medications, foley care, and bowel management. Barriers are cognition and confusion. Currently waiting on peer to peer. Really hasn't changed much since admission. Using steady for transfers. Dependent for all clothing management. Min/mod assist W/C mobility. New onset of dizziness and nystagmus. SLP working on cognition.  Patient on target to meet rehab goals: no, goals have been downgraded.  *See Care Plan and progress notes for long and short-term goals.   Revisions to Treatment Plan:  Not at this time.  Teaching Needs: Family education, medication management, pain management, foley management, bowel management, transfer training, W/C management, balance training, endurance training, safety awareness.  Current Barriers to Discharge: Decreased caregiver support, Medical stability, Home enviroment access/layout, Incontinence, Lack of/limited family support, Insurance for SNF coverage, Medication compliance, and foley care.  Possible Resolutions to Barriers: Continue  current medications, await peer to peer review, educate family on foley care, time toileting schedule, provide emotional support.     Medical Summary Current Status: slowed mentally d/t meds perhaps. 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 nutritionally  still challenged.  Barriers to Discharge: Medical stability   Possible Resolutions to Barriers/Weekly Focus: nutrition, pain mgt, daily assessment of labs.   Continued Need for Acute Rehabilitation Level of Care: The patient requires daily medical management by a physician with specialized training in physical medicine and rehabilitation for the following reasons: Direction of a multidisciplinary physical rehabilitation program to maximize functional independence : Yes Medical management of patient stability for increased activity during participation in an intensive rehabilitation regime.: Yes Analysis of laboratory values and/or radiology reports with any subsequent need for medication adjustment and/or medical intervention. : Yes   I attest that I was present, lead the team conference, and concur with the assessment and plan of the team.   Cristi Loron 07/15/2021, 12:54 PM

## 2021-07-16 NOTE — Progress Notes (Addendum)
Physical Therapy Weekly Progress Note  Patient Details  Name: Parker Odonnell. MRN: [id number] Date of Birth: 04-23-1932  Beginning of progress report period: July 09, 2021 End of progress report period: July 16, 2021  Today's Date: 07/16/2021 PT Individual Time: 1005-1110 and 1410-1505 PT Individual Time Calculation (min): 65 min and 55 min  Patient continues to fluctuate with progress with mobility, limited by new intermittent dizziness with nystagmus, nausea, and increased fatigue.  Overall, patient is near LTG level with downgrade to w/c level goals. Performs bed mobility, w/c mobility, and slide board transfers with supervision and sit to stand using RW with min A, however, with symptoms listed above, patient needs as much as max A with mobility intermittently. Focusing PT sessions on family education, d/c planning, and management of symptoms to improve consistency with mobility.   Patient continues to demonstrate the following deficits muscle weakness and muscle joint tightness, decreased cardiorespiratoy endurance, unbalanced muscle activation and motor apraxia, decreased visual acuity, decreased attention, decreased awareness, decreased problem solving, decreased safety awareness, and decreased memory, central origin, and decreased sitting balance, decreased standing balance, decreased postural control, and decreased balance strategies and therefore will continue to benefit from skilled PT intervention to increase functional independence with mobility.  Patient progressing toward long term goals..  Continue plan of care.  PT Short Term Goals Week 3:  PT Short Term Goal 1 (Week 3): STGs=LTGs due to ELOS PT Short Term Goal 1 - Progress (Week 3): Progressing toward goal Week 4:  PT Short Term Goal 1 (Week 4): STGs=LTGs due to ELOS  Skilled Therapeutic Interventions/Progress Updates:     Session 1: Patient in w/c with eyes closed upon PT arrival. Patient alert and agreeable  to PT session. Patient denied pain during session. Patient initially disoriented to place, stated "I have been at church all morning," easily reoriented and patient was able to state the correct day of the week, month, and year.  Patient denied nausea and dizziness throughout session.  Therapeutic Activity: Bed Mobility: Patient performed sit to supine with min A for R lower extremity management. Provided verbal cues for bringing knees to chest to lift legs onto the bed. Transfers: Patient performed blocked practice slide board transfers w/c<>bed x5 for repetition for improved motor planning and recall of sequencing and total for board placement on R and min A on L. Provided cues for hand placement, board placement, and head-hips relationship for proper technique and decreased assist with transfers. Patient progressed from mod A to the R to min A on the L to min A on the R and supervision to the L.   Neuromuscular Re-ed: Patient performed the following activities for improved lower extremity motor control and balance with functional activities: - sit to/from stand x4 with min A using the // bars. Provided verbal cues for initiation, forward weight shift, hip/knee/trunk extension and facilitation for R knee extension -standing balance x2 with min A for R knee extension and supervision for trunk control x20-30 sec -forward walking with 5 lb ankle weight on R for proprioceptive feedback for R foot placement with min A 2x8 ft in // bars with +2 w/c follow for safety  Patient required increased time and rest breaks due to decreased activity tolerance. Educated on focusing on safe functional mobility and R lower extremity strength and motor planning during rest breaks. Patient able to recall 2/3 activities performed during session.   Patient in bed asleep at end of session with breaks locked, bed alarm set,  and all needs within reach. Patient missed 10 min of skilled PT due to fatigue, RN made aware. Will  attempt to make-up missed time as able.    Session 2: Patient in bed upon PT arrival. Patient alert and agreeable to PT session. Patient denied pain during session.  Patient denied nausea and dizziness throughout session.  Therapeutic Activity: Bed Mobility: Patient performed supine to/from sit with min A for R lower extremity and trunk management. Provided verbal cues for bringing knees to chest to lift legs onto the bed. Transfers: Patient performed slide board transfers bed<>w/c with supervision R and L and total for board placement on R and min A on L. Utilized teach-back method for sequencing with mod cues for w/c set-up, hand placement, board placement, and head-hips relationship for proper technique and decreased assist with transfers.  Wheelchair Mobility:  Patient propelled wheelchair 70 feet with CGA due to veering L. Provided verbal cues for equal and bilateral propulsion, rather than reciprocal propulsion.   Neuromuscular Re-ed: Patient performed the following activities for improved lower extremity motor control and balance with functional activities: - sit to/from stand x2 with min A RW, provided verbal cues for initiation, forward weight shift, hip/knee/trunk extension and facilitation for R knee extension -forward walking with 4 lb ankle weight on R for proprioceptive feedback for R foot placement with min progressing to mod A with fatigue x17 feet and x20 feet using RW with +2 w/c follow for safety -seated bicep curls and chest press using 4 lb weight during w/c transport  Patient required increased time and rest breaks due to decreased activity tolerance.   Patient in bed asleep at end of session with breaks locked, bed alarm set, and all needs within reach.     Therapy Documentation Precautions:  Precautions Precautions: Fall Precaution Comments: R>L lower extremity weakness, pt is legally blind, has foley catheter Restrictions Weight Bearing Restrictions:  No General: PT Amount of Missed Time (min): 10 Minutes   Therapy/Group: Individual Therapy  Braylen Staller L Eudell Julian PT, DPT  07/16/2021, 3:53 PM

## 2021-07-16 NOTE — Progress Notes (Signed)
Occupational Therapy Session Note  Patient Details  Name: Parker Odonnell. MRN: [id number] Date of Birth: January 02, 1932  Today's Date: 07/16/2021 OT Individual Time: 2458-0998 OT Individual Time Calculation (min): 75 min    Short Term Goals: Week 1:  OT Short Term Goal 1 (Week 1): Patient will perform sit<>stand with max A of 1 and LRAD in preparation for BADL tasks. OT Short Term Goal 1 - Progress (Week 1): Met OT Short Term Goal 2 (Week 1): Patient will perform 1 step of LB dressing task OT Short Term Goal 2 - Progress (Week 1): Met OT Short Term Goal 3 (Week 1): Pt will don shirt with min A OT Short Term Goal 3 - Progress (Week 1): Met Week 2:  OT Short Term Goal 1 (Week 2): Pt will complete sit > stand with LRAD and MOD A in prep for LB dressing OT Short Term Goal 2 (Week 2): Pt will complete transfer to elevated toilet with MOD A OT Short Term Goal 3 (Week 2): Pt will thread BLE through pants with A only for catheter mgmt OT Short Term Goal 4 (Week 2): Pt will complete donning of socks/shoes with MOD A   Skilled Therapeutic Interventions/Progress Updates:    Pt received in bed with breakfast on tray, and agreeable to OT. Declines shower and reports that nursing helped to change clothes and get cleaned up earlier. Overall, pt demonstrated cognitive decline. Needed to be reminded three times that he did not have pants on. Oriented to month and day of week, but said the year was 1972. Required MAX prompting and 3 tries before naming year correctly. Hypersensitive to pain/touch at times. Anticipatory and would jump in pain with no physical contact.   ADL: Pt completes BADL at overall MIN UB, MOD-MAX A level. Skilled interventions include: Pt to EOB from supine with CGA. Session focused on washing up at sink level and putting pants on. Pt completed SB transfer EOB > manual wc with MIN A. Setup at sink level for oral hygiene and grooming tasks. Needed directional cuing to select toothbrush  instead of mouth sponge on L side. Impaired termination noted d/t pt not noticing sink almost overflowing while brushing teeth. Motor perseveration with pt dabbing eyes with towel continuously after face was washed. Pt boosted through arms seated in wc for pants over hips. Pants were donned with MAX A with pt only pulling over knees and OTS facilitating catheter management distally. Performed sit > stand MOD A at sink 3x with knee block R side. Pt demonstrated increased anxiety and needed additional time for rest in between. Tactile cuing provided for proper hand placement. Pt fatigued by third time, and could not hold standing balance with RW in front d/t weakness in R leg. Squat > pivot from manual wc back to TIS wc with MIN A.  Pt left at end of session in TIS wc with exit belt on, call light in reach and all needs met   Therapy Documentation Precautions:  Precautions Precautions: Fall Precaution Comments: R>L lower extremity weakness, pt is legally blind, has foley catheter Restrictions Weight Bearing Restrictions: No     Therapy/Group: Individual Therapy  Kean Gautreau 07/16/2021, 7:45 AM

## 2021-07-16 NOTE — Progress Notes (Addendum)
Patient ID: Parker Sink., male   DOB: [birth year], 87 y.o.   MRN: [id number]  SW received message from [name] reporting that she completed fast appeal and [employer] informed there will be a response within 72hrs. Also reports they would now like [facility].   *SW spoke with Parker Odonnell/Admissions to discuss patient situation. Reports will need to wait to hear if there has been insurance approval before they can discuss if able to extend a bed. SW will follow-up once there is an answer.   Loralee Pacas, MSW, [place] Office: [phone] Cell: [phone] Fax: [phone]

## 2021-07-17 MED ORDER — MECLIZINE HCL 12.5 MG PO TABS: 12.5000 mg | ORAL_TABLET | Freq: Three times a day (TID) | ORAL | 0 refills | Status: DC | PRN

## 2021-07-17 MED ORDER — CLONIDINE 0.1 MG/24HR TD PTWK: 0.1000 mg | MEDICATED_PATCH | TRANSDERMAL | 12 refills | Status: DC

## 2021-07-17 MED ORDER — POLYETHYLENE GLYCOL 3350 17 G PO PACK: 17.0000 g | PACK | Freq: Every day | ORAL | 0 refills | Status: AC

## 2021-07-17 MED ORDER — LIDOCAINE 5 % EX PTCH: 1.0000 | MEDICATED_PATCH | CUTANEOUS | 0 refills | Status: AC

## 2021-07-17 NOTE — Plan of Care (Signed)
Pt's plan of care adjusted to 15/7 after speaking with care team and discussed with MD in team conference as pt currently unable to tolerate current therapy schedule with OT, PT, and SLP.   

## 2021-07-17 NOTE — Progress Notes (Signed)
Occupational Therapy Discharge Summary  Patient Details  Name: Parker Odonnell. MRN: [id number] Date of Birth: [birth year]  Pt has made progress during his stay at [facility] with improvements in STS, functional transfers and decreased anxiety. Pt demos significant cognitive and visual perceptual deficits that impact pt performance of BADLS and performance of functional mobility. Pt also has decrased ability to process sensory information from environment (showering) v quieter more controlled environments.   Patient has met 10 of 12 long term goals due to improved activity tolerance, improved balance, postural control, ability to compensate for deficits, functional use of  RIGHT upper and RIGHT lower extremity, improved attention, improved awareness, and improved coordination.  Patient to discharge at [facility] Assist level.  Pt is [facility] to continue to decrease BOC and continue to progress to improve independence  Reasons goals not met: Pt did not meet toileting goal or LB dressing goal as they are still at MAX A d/t poor sit to stand/fear of falling/RLE weakness impacting ADL performance  Recommendation:  Patient will benefit from ongoing skilled OT services in skilled nursing facility setting to continue to advance functional skills in the area of BADL and Reduce care partner burden.  Equipment: No equipment provided  Reasons for discharge: treatment goals met and discharge from hospital  Patient/family agrees with progress made and goals achieved: Yes  OT Discharge Precautions/Restrictions  Precautions Precautions: Fall Precaution Comments: R>L lower extremity weakness, pt is legally blind, has foley catheter Restrictions Weight Bearing Restrictions: No  ADL ADL Eating: Set up Grooming: Supervision/safety Upper Body Bathing: Supervision/safety Where Assessed-Upper Body Bathing: Sitting at sink Lower Body Bathing: Moderate assistance Where Assessed-Lower Body Bathing: Edge of  bed Upper Body Dressing: Supervision/safety Where Assessed-Upper Body Dressing: Wheelchair Lower Body Dressing: Maximal assistance Where Assessed-Lower Body Dressing: Edge of bed Toileting: Maximal assistance Where Assessed-Toileting: Bedside Commode Toilet Transfer: Therapist, music Method: Editor, commissioning Transfer: Unable to assess Social research officer, government: Curator Method:  (SB) Youth worker: Gaffer Baseline Vision/History: 2 Legally blind;6 Macular Degeneration Patient Visual Report: No change from baseline Vision Assessment?: Yes Eye Alignment: Impaired (comment) Ocular Range of Motion: Within Functional Limits Alignment/Gaze Preference: Within Defined Limits Saccades: Within functional limits Perception  Perception: Impaired Body Part Identification: pt with poor proprioception and body awareness Praxis Praxis: Impaired Praxis Impairment Details: Motor planning;Perseveration;Initiation Cognition Overall Cognitive Status: Impaired/Different from baseline Arousal/Alertness: Awake/alert Orientation Level: Oriented to person Year: Other (Comment) (0737) Month: September Day of Week: Incorrect Memory: Impaired Memory Impairment: Retrieval deficit;Decreased recall of new information Immediate Memory Recall: Blue;Sock;Bed Memory Recall Sock: Not able to recall Memory Recall Blue: Without Cue Memory Recall Bed: Not able to recall Awareness: Impaired Awareness Impairment: Intellectual impairment Problem Solving: Impaired Safety/Judgment: Impaired Sensation Sensation Light Touch: Impaired Detail Peripheral sensation comments: hypersensitivity to touch in BLE, improved since eval Coordination Gross Motor Movements are Fluid and Coordinated: No Fine Motor Movements are Fluid and Coordinated: No Coordination and Movement Description: decreased smoothness and accuracy Motor  Motor Motor:  Abnormal postural alignment and control Motor - Skilled Clinical Observations: Generalized weakness, decreased balance strategies, fear of falling Mobility  Bed Mobility Rolling Right: Supervision/verbal cueing Right Sidelying to Sit: Supervision/Verbal cueing Sitting - Scoot to Edge of Bed: Minimal Assistance - Patient > 75% Transfers Stand to Sit: Moderate Assistance - Patient 50-74%;Maximal Assistance - Patient 25-49%  Trunk/Postural Assessment  Cervical Assessment Cervical Assessment:  (forward head) Thoracic Assessment Thoracic Assessment:  (rounded  shoulders) Lumbar Assessment Lumbar Assessment:  (post pelvic tilt) Postural Control Postural Control: Deficits on evaluation  Balance Balance Balance Assessed: Yes Static Sitting Balance Static Sitting - Level of Assistance: 5: Stand by assistance Dynamic Sitting Balance Dynamic Sitting - Level of Assistance: 5: Stand by assistance Static Standing Balance Static Standing - Level of Assistance: 3: Mod assist Extremity/Trunk Assessment RUE Assessment General Strength Comments: Generalized weakness LUE Assessment General Strength Comments: generalized weakness   Tonny Branch 07/17/2021, 4:42 PM

## 2021-07-17 NOTE — Discharge Summary (Addendum)
Physician Discharge Summary  Patient ID: Parker Odonnell. MRN: [id number] DOB/AGE: [birth year] 87 y.o.  Admit date: 06/25/2021 Discharge date: 07/18/2021  Discharge Diagnoses:  Principal Problem:   VZV (varicella-zoster virus) infection Active Problems:   Pressure injury of skin   Hyperkalemia   Hyponatremia   Neuropathic pain   Malnutrition of moderate degree   Cognitive retention disorder Atrial fibrillation Hypertension BPH Hyperlipidemia L1 superior endplate compression fracture/mild edema 5 mm right lower lobe pulmonary nodule versus intrapulmonary lymph node CAD Legally blind Diastolic congestive heart failure  Discharged Condition: Stable  Significant Diagnostic Studies: DG Abd 1 View  Result Date: 07/12/2021 CLINICAL DATA:  Possible obstipation. EXAM: ABDOMEN 2 VIEW COMPARISON:  CT, 06/14/2021. FINDINGS: Normal bowel gas pattern. No significant increase in colonic stool burden. No evidence of renal or ureteral stones. Status post cholecystectomy and left total hip arthroplasty stable from the prior exam. No acute skeletal abnormality. IMPRESSION: 1. No acute findings.  No increase in the colonic stool burden. Electronically Signed   By: Lajean Manes M.D.   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 Found to have indeterminate extramedullary lesion on prior brain MRI, re-evaluate. EXAM: MRI HEAD WITHOUT AND WITH CONTRAST TECHNIQUE: Multiplanar, multiecho pulse sequences of the brain and surrounding structures were obtained without and with intravenous contrast. CONTRAST:  20mL GADAVIST GADOBUTROL 1 MMOL/ML IV SOLN COMPARISON:  Comparison made with recent brain MRI from 06/12/2021. FINDINGS: Brain: Stable cerebral volume, felt to be within normal limits for age. No significant cerebral white matter disease.  Note made of a small focus of encephalomalacia and gliosis at the anterior aspect of the left frontal lobe, likely reflecting a tiny remote cortical infarct (series 11, images 16, 15). This is stable in retrospect as compared to prior exam, somewhat difficult to see on prior study given motion artifact on that study. No abnormal foci of restricted diffusion to suggest acute or subacute ischemia. Gray-white matter differentiation maintained. No other areas of chronic or interval cortical infarction. No evidence for acute or chronic intracranial hemorrhage. Again seen is a 9 mm well-circumscribed ovoid extramedullary lesion positioned along the left aspect of the medulla, adjacent to the left foramen of Luschka (series 11, image 5). Again, this lesion is seen to demonstrate no appreciable enhancement, and is stable from prior. No associated mass effect. No other mass lesion, midline shift or mass effect. Mild ventricular prominence related to global parenchymal volume loss without hydrocephalus. No extra-axial fluid collection. Stable appearance of the pituitary gland and suprasellar region normal. Midline structures intact. No abnormal enhancement. Vascular: Major intracranial vascular flow voids are maintained. Skull and upper cervical spine: Degenerative thickening noted about the tectorial membrane without significant stenosis. Craniocervical junction otherwise unremarkable. Bone marrow signal intensity remains within normal limits. No scalp soft tissue abnormality. Sinuses/Orbits: Globes and orbital soft tissues demonstrate no acute finding. Mild scattered mucosal thickening noted within the ethmoidal air cells and maxillary sinuses. Superimposed small right maxillary sinus retention cyst. Moderate right with small left mastoid effusions, similar to previous. Visualized nasopharynx unremarkable. Inner ear structures grossly normal. Other: None. IMPRESSION: 1. No acute intracranial abnormality. 2. Stable 9 mm  well-circumscribed ovoid extramedullary lesion along the left aspect of the medulla. 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 No associated mass effect. 3. 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 4. Otherwise normal brain MRI for age. 5. Moderate right with small left mastoid effusions, similar to previous. Electronically Signed   By: Jeannine Boga M.D.   On: 07/12/2021 01:24   VAS [place] LOWER EXTREMITY VENOUS (DVT)  Result Date: 07/02/2021  Lower Venous DVT Study Patient Name:  Parker Odonnell.  Date of Exam:   07/02/2021 Medical Rec #: [id number]          Accession #:    [id number] Date of Birth: [birth year]         Patient Gender: M Patient Age:   87 years Exam Location:  [facility] Procedure:      VAS [place] LOWER EXTREMITY VENOUS (DVT) Referring Phys: Thelma Comp PATEL --------------------------------------------------------------------------------  Indications: Edema.  Performing Technologist: Archie Patten RVS  Examination Guidelines: A complete evaluation includes B-mode imaging, spectral Doppler, color Doppler, and power Doppler as needed of all accessible portions of each vessel. Bilateral testing is considered an integral part of a complete examination. Limited examinations for reoccurring indications may be performed as noted. The reflux portion of the exam is performed with the patient in reverse Trendelenburg.  +---------+---------------+---------+-----------+----------+--------------+ RIGHT    CompressibilityPhasicitySpontaneityPropertiesThrombus Aging +---------+---------------+---------+-----------+----------+--------------+ CFV      Full           Yes      Yes                                 +---------+---------------+---------+-----------+----------+--------------+ SFJ      Full                                                         +---------+---------------+---------+-----------+----------+--------------+ FV Prox  Full                                                        +---------+---------------+---------+-----------+----------+--------------+ FV Mid   Full                                                        +---------+---------------+---------+-----------+----------+--------------+ FV DistalFull                                                        +---------+---------------+---------+-----------+----------+--------------+ PFV      Full                                                        +---------+---------------+---------+-----------+----------+--------------+ POP      Full           Yes      Yes                                 +---------+---------------+---------+-----------+----------+--------------+  PTV      Full                                                        +---------+---------------+---------+-----------+----------+--------------+ PERO     Full                                                        +---------+---------------+---------+-----------+----------+--------------+   +---------+---------------+---------+-----------+----------+-------------------+ LEFT     CompressibilityPhasicitySpontaneityPropertiesThrombus Aging      +---------+---------------+---------+-----------+----------+-------------------+ CFV      Full           Yes      Yes                                      +---------+---------------+---------+-----------+----------+-------------------+ SFJ      Full                                                             +---------+---------------+---------+-----------+----------+-------------------+ FV Prox  Full                                                             +---------+---------------+---------+-----------+----------+-------------------+ FV Mid   Full                                                              +---------+---------------+---------+-----------+----------+-------------------+ FV DistalFull                                                             +---------+---------------+---------+-----------+----------+-------------------+ PFV      Full                                                             +---------+---------------+---------+-----------+----------+-------------------+ POP      Full           Yes      Yes                                      +---------+---------------+---------+-----------+----------+-------------------+ PTV  Full                                                             +---------+---------------+---------+-----------+----------+-------------------+ PERO                                                  Not well visualized +---------+---------------+---------+-----------+----------+-------------------+     Summary: BILATERAL: - No evidence of deep vein thrombosis seen in the lower extremities, bilaterally. -No evidence of popliteal cyst, bilaterally.   *See table(s) above for measurements and observations. Electronically signed by Monica Martinez MD on 07/02/2021 at 2:56:34 PM.    Final     Labs:  Basic Metabolic Panel: Recent Labs  Lab 07/11/21 1056 07/12/21 0651 07/13/21 0815 07/15/21 0608  NA 133* 136 135 136  K 4.0 3.4* 4.0 4.2  CL 103 106 107 109  CO2 23 24 24 22   GLUCOSE 145* 102* 105* 103*  BUN 16 15 13 9   CREATININE 0.94 0.97 0.99 0.94  CALCIUM 8.9 9.2 8.8* 8.8*    CBC: Recent Labs  Lab 07/11/21 1056 07/15/21 0608  WBC 5.4 4.7  HGB 10.5* 10.6*  HCT 31.2* 31.1*  MCV 101.6* 100.3*  PLT 131* 125*    CBG: No results for input(s): GLUCAP in the last 168 hours.  Family history.  Paternal uncle with hypertension.  Negative for CVA colon cancer or esophageal cancer  Brief HPI:   Parker Odonnell. is a 87 y.o. right-handed male with history of CAD legally blind diastolic congestive heart failure  hyperlipidemia atrial fibrillation maintained on Eliquis as well as left hip replacement.  Per chart review lives alone independent living facility [facility].  Uses a rolling walker prior to admission.  Presented 06/10/2021 with right leg pain since 05/30/2021 as well as recent fall.  Denied loss of conscious.  Noted recent rash started on valacyclovir for suspected shingles uncertain diagnosis.  Admission chemistries unremarkable sodium 125, troponin negative.  MRI of the brain unremarkable for acute intracranial process.  There was a 9 mm nodular lesion along the left lateral aspect of the medulla, indeterminate.  No associated mass-effect.  MRA of head and neck negative.  MRI thoracic lumbar spine showed acute compression fracture involving the superior endplate of L1 with up to 35% height loss without significant bony retropulsion.  Additional probable acute infarct of the left sacrum partially visualized.  Mild edema within the spinous process of T4 and T5 of uncertain etiology.  CT of the chest abdomen pelvis showed no mass or adenopathy.  There was a 5 mm right lower lobe pulmonary nodule recommend CT scan in 12 months.  MRI follow-up of the pelvis evaluation significant limited by metallic susceptibility artifact related to left hip arthroplasty.  There was relatively focal bone marrow edema of the left aspect of S3 consistent with insufficiency fracture.  He did undergo LP CSF with leukocytosis/lymphocytes and elevated protein.  Neurology as well as ID consulted for follow-up bilateral lower extremity weakness right more than left purpuric right lower extremity rash.  Etiology unclear differential included infectious vasculitis paraneoplastic extensive work-up noted.  Imaging so far was unrevealing.  Autoimmune work-up negative.  Skin biopsy was pending.  Given lymphocytic pleocytosis and elevated protein on CSF, VZV PCR now positive continued on acyclovir stopped 06/25/2021 admission isolation  discontinued.  He remained on chronic Eliquis as prior to admission.  Patient with BPH mild hydronephrosis seen on CT abdomen maintain on Flomax.  Therapy evaluations completed due to patient decreased functional mobility was admitted for a comprehensive rehab program.   Hospital Course: Parker Odonnell. was admitted to rehab 06/25/2021 for inpatient therapies to consist of PT, ST and OT at least three hours five days a week. Past admission physiatrist, therapy team and rehab RN have worked together to provide customized collaborative inpatient rehab.  Pertaining to patient's debility secondary to  VCV meningitis intravenous acyclovir completed 06/25/2021.  Patient with bouts of vertigo and nystagmus 923 improved neurology did follow-up MRI does not demonstrate any acute findings.  Persistent intramedullary lesion along left lateral medulla unchanged from prior scans.  Patient remained on chronic Eliquis for history of atrial fibrillation cardiac rate controlled.  Noted viral rash question vasculitis all lesions fully crusted isolation discontinued.  Bouts of hyponatremia resolved latest sodium 140.  Noted history of diastolic congestive heart failure ejection fraction 35 to 40% no signs of fluid overload.  Close monitoring of blood pressure that remains soft remained on clonidine patch as well as Coreg.  Pain control with the use of a Lidoderm patch.  L1 superior endplate compression fracture conservative care.  Hospital findings 5 mm right lower lobe pulmonary nodule versus intrapulmonary lymph node repeat noncontrast CT chest in 12 months.  Bouts of constipation resolved with laxative assistance.  Noted BPH mild hydronephrosis continued on Flomax maintain Foley catheter tube patient failed voiding trial 07/11/2021   Blood pressures were monitored on TID basis and soft and monitored     Rehab course: During patient's stay in rehab weekly team conferences were held to monitor patient's progress, set goals  and discuss barriers to discharge. At admission, patient required minimal assist to feet rolling walker moderate assist sit to stand  Physical exam blood pressure 113/66 pulse 77 temperature 97.8 respirations 18 oxygen saturation 95% room air Constitutional.  No acute distress HEENT Head.  Normocephalic and atraumatic Eyes.  Pupils sluggish to light Neck.  Supple nontender no JVD without thyromegaly Cardiac rate controlled Abdomen.  Soft nontender positive bowel sounds without rebound Respiratory effort normal no respiratory distress without wheeze Genitourinary Foley tube in place Neurologic.  Alert sitting up in chair follows verbal commands provides name and age however he could not recall the name of his independent living facility.  Limited medical historian Motor.  Bilateral upper extremities 4/5 proximal distal Bilateral lower extremities hip flexion knee extension 2+/5 ankle dorsiflexion 4/5  He/She  has had improvement in activity tolerance, balance, postural control as well as ability to compensate for deficits. He/She has had improvement in functional use RUE/LUE  and RLE/LLE as well as improvement in awareness.  Patient continued to fluctuate with progress in mobility.  Performs bed mobility wheelchair mobility and slide board transfers with supervision and sit to stand using rolling walker with minimal assist.  Demonstrates deficits in muscle weakness decreased cardiorespiratory endurance.  Propels wheelchair with contact-guard.  Supine to sit edge of bed with minimal assist for ADLs.  Max assist to pull down brief.  Sit to stand utilizing steady due to difficulty with assessing bathroom with contact-guard.  Speech therapy facilitated sessions by providing moderate verbal and visual cues for use of external aids for orientation to  place time and situation.  Due to limited advances with therapy skilled nursing facility was recommended.       Disposition: Discharge to skilled nursing  facility    Diet: Regular  Special Instructions: No driving smoking or alcohol  Continue Foley catheter tube due to urinary retention failed voiding trial and change as needed  Medications at discharge 1.  Tylenol as needed 2.  Eliquis 2.5 mg p.o. twice daily 3.  Lipitor 40 mg p.o. nightly 4.  Coreg 6.25 mg p.o. twice daily 5.  Clonidine patch 0.1 mg every 7 days 6.  Lidoderm patch change as directed 7.  Antivert 12.5 mg p.o. 3 times daily as needed dizziness 8.  Multivitamin daily 9.  MiraLAX daily hold for loose stools 10.  Senokot S2 tablets nightly 11.  Flomax 0.4 mg daily 12.  B complex vitamin daily  30-35 minutes were spent completing discharge summary and discharge planning     Contact information for follow-up providers     Meredith Staggers, MD Follow up.   Specialty: Physical Medicine and Rehabilitation Why: No formal follow-up needed Contact information: [place] [id number] [phone]         Mignon Pine, DO Follow up.   Specialties: Infectious Diseases, Internal Medicine Why: Call for appointment Contact information: [address] [place] [address] [phone]              Contact information for after-discharge care     [facility] .   Service: Skilled Nursing Contact information: [address] [place] [phone]                     Signed: Lavon Paganini [place] 07/18/2021, 5:18 AM

## 2021-07-17 NOTE — Progress Notes (Addendum)
Patient ID: [place] Sink., male   DOB: [birth year], 87 y.o.   MRN: [id number]  SW received phone call from pt dtr [name] reporting that she received a message from [facility] approving his placement. Auth ID# [id number]. SW informed on updates from [facility] no beds available as of yesterday, and asked what is  the alternative option. Family is insistent on [facility]. Dtr to call SW back after meeting today.   SW spoke with [facility] to discuss bed availability. Reports will review referral and follow-up.  SW spoke with [facility] [phone]) to confirm if authorization was approved. Confirms that this Josem Kaufmann will not show up in [place] system since the appeal was approved by [facility]. Auth date beginning on 9/26. Reports that the appeal was overturned and accepted for [facility]. SW inquired about process to change SNF location. Reports will need to submit new auth and go through same process again.   *SW received updates from [facility] reporting no beds, and possibly middle of next week but not guaranteed. SW called pt dtr [name] to inform on above changes with regard to SNF. Reports she will call SW back.  SW later spoke to pt dtr who is amenable to [facility]. SW informed will f/u with SNF to discuss if bed remains and can pt transition tomorrow.   SW confirmed with Kathy/[facility] Liaison who confirms can still come tomorrow. Nurse Report [phone]. SW requested that admissions coordinator contact pt dtr [name] to discuss completing admission paperwork.   [facility] scheduled for p/u at 10am on 9/30.  SW updated pt dtr on above details and transportation p/u time.   SW informed pt assigned Therapist, sports.   SW updated Violeta Gelinas with [employer] [phone]) to inform on pt transition.  Loralee Pacas, MSW, [place] Office: [phone] Cell: [phone] Fax: [phone]

## 2021-07-17 NOTE — Progress Notes (Signed)
Occupational Therapy Progress Note  Patient Details  Name: Parker Odonnell. MRN: [id number] Date of Birth: [birth year]   Today's Date: 07/17/2021 OT Individual Time: 1000-1102 OT Individual Time Calculation (min): 62 min    OT Short Term Goals Week 1:  OT Short Term Goal 1 (Week 1): Patient will perform sit<>stand with max A of 1 and LRAD in preparation for BADL tasks. OT Short Term Goal 1 - Progress (Week 1): Met OT Short Term Goal 2 (Week 1): Patient will perform 1 step of LB dressing task OT Short Term Goal 2 - Progress (Week 1): Met OT Short Term Goal 3 (Week 1): Pt will don shirt with min A OT Short Term Goal 3 - Progress (Week 1): Met Week 2:  OT Short Term Goal 1 (Week 2): Pt will complete sit > stand with LRAD and MOD A in prep for LB dressing OT Short Term Goal 2 (Week 2): Pt will complete transfer to elevated toilet with MOD A OT Short Term Goal 3 (Week 2): Pt will thread BLE through pants with A only for catheter mgmt OT Short Term Goal 4 (Week 2): Pt will complete donning of socks/shoes with MOD A   Skilled Therapeutic Interventions/Progress Updates:    Pt has fluctuating in progress d/t new intermittent dizziness with nystagmus, fatigue, nausea, and declining cognitive abilities. Pt has increased anxiety during tx , hypersensitivity to touch, anticipatory to pain responses, and requires significant time to process cuing. Overall, pt requires varying levels of assistance from MIN-MAX with all BADLs. SB transfers with CGA, bed mobility with MIN A - CGA, UB dressing MIN A, LB dressing/bathing MAX A.   Pt received in manual wc with no c/o pain and agreeable to showering in OT session. Completed transfer wc > TTB with SB & CGA. Stood with heavy use of grab bars in shower to doff pants. Pt with mild inappropriate comments during session. Pt demonstrated impairments in processing information in shower, resulting in significant anxiety and awareness deficits. Appeared to be d/t  sensory overload - pt agitated, confused and could not command follow. Pt hypersensitive to temperature and lacked ability to distinguish cold from hot, unaware of washcloth placement on lap, and kept insisting all doors were open. Pt required TOTAL A in all bathing/dressing from OTS d/t agitation and significant lack of processing. Pt fatigued easily during tx, and required frequent rest breaks. Required CGA for transfer from TTB > wc. Donned shirt MOD A for time management. Stated multiple times during tx "I'm so sick! Someone help me!". Pt left at end of session in wc with exit belt alarm on, call light in reach and all needs met.    Therapy Documentation Precautions:  Precautions Precautions: Fall Precaution Comments: R>L lower extremity weakness, pt is legally blind, has foley catheter Restrictions Weight Bearing Restrictions: No   Therapy/Group: Individual Therapy  Jaryn Rosko 07/17/2021, 6:48 AM

## 2021-07-17 NOTE — Progress Notes (Signed)
PROGRESS NOTE   Subjective/Complaints: Up in w/c with PT. Pt feeling fairly well today. Alert, denies nausea. Discussed po intake and he said "I know I gotta eat more."   ROS: Patient denies fever, rash, sore throat, blurred vision, nausea, vomiting, diarrhea, cough, shortness of breath or chest pain,  headache, or mood change.   Objective:   No results found. Recent Labs    07/15/21 0608  WBC 4.7  HGB 10.6*  HCT 31.1*  PLT 125*     Recent Labs    07/15/21 0608  NA 136  K 4.2  CL 109  CO2 22  GLUCOSE 103*  BUN 9  CREATININE 0.94  CALCIUM 8.8*     Intake/Output Summary (Last 24 hours) at 07/17/2021 0954 Last data filed at 07/17/2021 0400 Gross per 24 hour  Intake 245 ml  Output 850 ml  Net -605 ml         Physical Exam: Vital Signs Blood pressure 116/88, pulse 76, temperature 98 F (36.7 C), temperature source Oral, resp. rate 14, height 5\' 8"  (1.727 m), weight 64 kg, SpO2 93 %.  Constitutional: No distress . Vital signs reviewed. HEENT: NCAT, EOMI, oral membranes moist Neck: supple Cardiovascular: RRR without murmur. No JVD    Respiratory/Chest: CTA Bilaterally without wheezes or rales. Normal effort    GI/Abdomen: BS +, non-tender, non-distended Ext: no clubbing, cyanosis, or edema Psych: pleasant and cooperative  Skin: healing abrasions both legs Musc: No edema in extremities.  Minimal LE tenderness Neuro: very alert. Oriented to hospital and person. Follows basic commands.  STM with some improvement in insight and awareness. Did not see nystagmus today with lateral gaze, however did not formally test Motor: Bilateral UE 5/5.  LLE 3+/5 prox to 4/5 distally.  RLE 2+ to 3-/5 prox to distal    Assessment/Plan: 1. Functional deficits which require 3+ hours per day of interdisciplinary therapy in a comprehensive inpatient rehab setting. Physiatrist is providing close team supervision and 24 hour  management of active medical problems listed below. Physiatrist and rehab team continue to assess barriers to discharge/monitor patient progress toward functional and medical goals  Care Tool:  Bathing    Body parts bathed by patient: Right arm, Left arm, Chest, Abdomen, Front perineal area, Right upper leg, Left upper leg, Face   Body parts bathed by helper: Right lower leg, Left lower leg, Buttocks     Bathing assist Assist Level: Moderate Assistance - Patient 50 - 74%     Upper Body Dressing/Undressing Upper body dressing   What is the patient wearing?: Pull over shirt    Upper body assist Assist Level: Supervision/Verbal cueing    Lower Body Dressing/Undressing Lower body dressing      What is the patient wearing?: Pants, Incontinence brief     Lower body assist Assist for lower body dressing: Maximal Assistance - Patient 25 - 49%     Toileting Toileting    Toileting assist Assist for toileting: Maximal Assistance - Patient 25 - 49%     Transfers Chair/bed transfer  Transfers assist     Chair/bed transfer assist level: Supervision/Verbal cueing Chair/bed transfer assistive device: Sliding board   Locomotion  Ambulation   Ambulation assist      Assist level: Moderate Assistance - Patient 50 - 74% Assistive device: Walker-rolling Max distance: 20 ft   Walk 10 feet activity   Assist  Walk 10 feet activity did not occur: Safety/medical concerns  Assist level: Moderate Assistance - Patient - 50 - 74% Assistive device: Walker-rolling   Walk 50 feet activity   Assist Walk 50 feet with 2 turns activity did not occur: Safety/medical concerns         Walk 150 feet activity   Assist Walk 150 feet activity did not occur: Safety/medical concerns         Walk 10 feet on uneven surface  activity   Assist Walk 10 feet on uneven surfaces activity did not occur: Safety/medical concerns         Wheelchair     Assist Is the patient  using a wheelchair?: Yes Type of Wheelchair: Manual Wheelchair activity did not occur: Safety/medical concerns  Wheelchair assist level: Contact Guard/Touching assist Max wheelchair distance: 70    Wheelchair 50 feet with 2 turns activity    Assist    Wheelchair 50 feet with 2 turns activity did not occur: Safety/medical concerns   Assist Level: Contact Guard/Touching assist   Wheelchair 150 feet activity     Assist  Wheelchair 150 feet activity did not occur: Safety/medical concerns   Assist Level: Supervision/Verbal cueing   Blood pressure 116/88, pulse 76, temperature 98 F (36.7 C), temperature source Oral, resp. rate 14, height 5\' 8"  (1.727 m), weight 64 kg, SpO2 93 %.  Medical Problem List and Plan: 1.  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  IV acyclovir completed today 06/25/2021  --Continue CIR therapies including PT, OT, and SLP. Interdisciplinary team conference today to discuss goals, barriers to discharge, and dc planning.   - -pt with vertigo/nystagmus seen on 9/23. Improved, inconsistent? -Appreciate neurology input. MRI does not demonstrate any acute findings. 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  -more alert off neurosedating meds -presentation likely a combination of factors related to his age, chronic changes of brain, etc -pt is medically stable for SNF placement 2.  Antithrombotics: -DVT/anticoagulation: Eliquis [employer]: Other (comment)             -antiplatelet therapy: N/A 3. Neuropathic pain R>L LE: - lidoderm patch             - stopped lyrica and tramadol d/t lethargy 4. Mood: Provide emotional support             -antipsychotic agents: N/A 5. Neuropsych: This patient is capable of making decisions on his own behalf. 6. Skin/Wound Care: Routine skin checks 7. Fluids/Electrolytes/Nutrition: I personally reviewed all of the patient's labs today, and lab work is within normal limits.              -added megace for appetite, caloric intake inconsistent   -ate 10-45% on 9/28   -discussed importance again of nutrition with pt  -he is now off IVF    8.  Viral leg rash/?vasculitis.  All lesions fully crusted.  Isolation discontinued  -skin bx --perivascular dermatitis  -pain better  -cbc stable 9.  Hyponatremia.                    9/16 Na+ up to 140 10.  Persistent atrial fibrillation.  Cardiac rate controlled.  Continue Coreg 6.25 mg twice daily/Eliquis  Heart rate controlled 9/29 11.  Diastolic congestive heart failure.  Ejection fraction 35 to 40% echocardiogram 2020.   Filed Weights   07/15/21 0500 07/16/21 0500 07/17/21 0416  Weight: 61.6 kg 63.8 kg 64 kg    Stable on 9/29 12.  Hypertension. Stopped Norvasc. Added clonidine patch right lower extremity for HTN and neuropathic pain  Controlled on 9/27 13.  BPH/mild hydronephrosis.  Continue Flomax 0.4 mg daily. Maintain Foley tube until follow-up outpatient urology.  Patient failed voiding trial 06/21/2021  -9/24 urine remains clear, afebrile. Continue foley care 14.  Hyperlipidemia: Lipitor 15.  L1 superior endplate compression fracture/mild edema with spinous process T4-T5, sacral insufficiency fracture.  Supportive care 16.  5 mm right lower lobe pulmonary nodule versus intrapulmonary lymph -node.  Repeat noncontrast CT chest 12 months 17. Hyperkalemia: kayexalate lokelma give so far Potassium 4.8 on 9/16, 4.1 9/19 18. Slow transit constipation, recent, associated nausea   -moved bowels 9/27 19. Chronic thrombocytopenia is stable   LOS: 22 days A FACE TO FACE EVALUATION WAS PERFORMED  Meredith Staggers 07/17/2021, 9:54 AM

## 2021-07-17 NOTE — Progress Notes (Signed)
Physical Therapy Session Note  Patient Details  Name: Parker Odonnell. MRN: [id number] Date of Birth: 05-May-1932  Today's Date: 07/17/2021 PT Individual Time: 0800-0900 PT Individual Time Calculation (min): 60 min   Short Term Goals: Week 4:  PT Short Term Goal 1 (Week 4): STGs=LTGs due to ELOS  Skilled Therapeutic Interventions/Progress Updates:     Patient in bed upon PT arrival. Patient alert and agreeable to PT session. Patient reported 7/10 R lower extremity pain during session, RN made aware. PT provided repositioning, rest breaks, and distraction as pain interventions throughout session.    Patient reported mild nausea and dizziness with mobility throughout session.   Therapeutic Activity: Bed Mobility: Patient performed supine to sit with min A for R lower extremity and trunk management. Provided verbal cues for bringing knees to chest to lift legs onto the bed. Transfers: Patient performed slide board transfer bed>w/c with supervision and total for board placement. Utilized teach-back method for sequencing with mod cues for w/c set-up, hand placement, board placement, and head-hips relationship for proper technique and decreased assist with transfers.   Wheelchair Mobility:  Patient propelled wheelchair >150 feet with supervision and reduced veering today. Provided verbal cues for equal and bilateral propulsion.    Neuromuscular Re-ed: Patient performed the following activities for improved lower extremity motor control and balance with functional activities: - sit to/from stand x2 with min A RW, provided verbal cues for initiation, forward weight shift, hip/knee/trunk extension and facilitation for R knee extension -standing balance x20 sec with RW progressing from min A to CGA -standing balance x1 min with RW progressing from min A-CGA progressing from static standing to swaying, to heel lifts to the rhythm of Parker Odonnell    Patient required increased time and rest breaks  due to decreased activity tolerance.    Patient in bed asleep at end of session with breaks locked, bed alarm set, and all needs within reach.   Therapy Documentation Precautions:  Precautions Precautions: Fall Precaution Comments: R>L lower extremity weakness, pt is legally blind, has foley catheter Restrictions Weight Bearing Restrictions: No General:   Vital Signs:   Pain: Pain Assessment Pain Score: 5  Faces Pain Scale: No hurt Pain Type: Chronic pain Pain Location: Leg Pain Orientation: Right;Left Pain Descriptors / Indicators: Aching;Tender;Restless Pain Onset: On-going Patients Stated Pain Goal: 2 Pain Intervention(s): Medication (See eMAR);Repositioned;Ambulation/increased activity;Massage;Relaxation Multiple Pain Sites: No PAINAD (Pain Assessment in Advanced Dementia) Breathing: normal Negative Vocalization: none Facial Expression: smiling or inexpressive Consolability: no need to console Mobility:   Locomotion :    Trunk/Postural Assessment :    Balance:   Exercises:   Other Treatments:      Therapy/Group: Individual Therapy  Leatrice Parilla L Elyssia Strausser PT, DPT  07/17/2021, 11:58 AM

## 2021-07-17 NOTE — Progress Notes (Signed)
Speech Language Pathology Daily Session Note  Patient Details  Name: Parker Odonnell. MRN: [id number] Date of Birth: Feb 24, 1932  Today's Date: 07/17/2021 SLP Individual Time: 8295-6213 SLP Individual Time Calculation (min): 29 min  Short Term Goals: Week 3: SLP Short Term Goal 1 (Week 3): STGs=LTGs due to ELOS  Skilled Therapeutic Interventions: Skilled treatment session focused on cognitive goals. Upon arrival, patient was awake while upright in the wheelchair with his daughter present. SLP facilitated session by providing Mod verbal cues for recall of events from previous therapy sessions. Patient independently oriented to the hospital today and required Min visual aids for orientation to name of hospital, situation and date. Patient also utilized the clock to report the time and time of day. Patient's daughter present and educated on goals, progress, etc. Patient left upright in wheelchair with alarm on and all needs within reach. Continue with current plan of care.      Pain No/Denies Pain   Therapy/Group: Individual Therapy  [name] 07/17/2021, 3:19 PM

## 2021-07-18 LAB — SARS CORONAVIRUS 2 (TAT 6-24 HRS): SARS Coronavirus 2: NEGATIVE

## 2021-07-18 NOTE — Progress Notes (Signed)
Physical Therapy Discharge Summary  Patient Details  Name: Parker Odonnell. MRN: [id number] Date of Birth: 08-17-1932  Today's Date: 07/18/2021    Patient has met 7 of 7 long term goals due to improved activity tolerance, improved balance, improved postural control, increased strength, increased range of motion, decreased pain, ability to compensate for deficits, improved attention, improved awareness, and improved coordination.  Patient to discharge at a wheelchair level [place] -supervision.   Patient's care partner  unable  to provide the necessary physical and cognitive assistance at discharge, plan for SNF placement at d/c for continued skilled therapies for continued progress with mobility due to limited family support.  Reasons goals not met: n/a  Recommendation:  Patient will benefit from ongoing skilled PT services in skilled nursing facility setting to continue to advance safe functional mobility, address ongoing impairments in balance, strength, activity tolerance, R lower extremity ROM, pain tolerance and NMR, functional mobility, w/c mobility, gait training if able, and minimize fall risk.  Equipment: Equipment to be provided at next level of care   Reasons for discharge: discharge from hospital and patient unable to achieve level of assist that family could provide, patient to d/c to SNF  Patient/family agrees with progress made and goals achieved: Yes  PT Discharge Precautions/Restrictions Precautions Precautions: Fall Precaution Comments: R>L lower extremity weakness, pt is legally blind, has foley catheter Restrictions Weight Bearing Restrictions: No Pain Interference Pain Interference Pain Effect on Sleep: 1. Rarely or not at all Pain Interference with Therapy Activities: 2. Occasionally Pain Interference with Day-to-Day Activities: 4. Almost constantly Vision/Perception  Vision - History Ability to See in Adequate Light: 3 Highly impaired Vision -  Assessment Eye Alignment: Impaired (comment) Ocular Range of Motion: Within Functional Limits Alignment/Gaze Preference: Within Defined Limits Tracking/Visual Pursuits: Able to track stimulus in all quads without difficulty Saccades: Within functional limits Perception Perception: Impaired Body Part Identification: pt with poor proprioception and body awareness Praxis Praxis: Impaired Praxis Impairment Details: Motor planning;Perseveration;Initiation  Cognition Overall Cognitive Status: Impaired/Different from baseline Arousal/Alertness: Awake/alert Orientation Level: Oriented to person;Disoriented to place;Disoriented to situation Memory: Impaired Memory Impairment: Retrieval deficit;Decreased recall of new information Awareness: Impaired Awareness Impairment: Intellectual impairment Problem Solving: Impaired Problem Solving Impairment: Functional basic Executive Function:  (all impaired due to lower level deficits) Safety/Judgment: Impaired Sensation Sensation Light Touch: Impaired Detail Peripheral sensation comments: hypersensitivity to touch in BLE R>L, improved since eval Proprioception: Impaired by gross assessment Coordination Gross Motor Movements are Fluid and Coordinated: No Fine Motor Movements are Fluid and Coordinated: No Coordination and Movement Description: decreased smoothness and accuracy Motor  Motor Motor: Abnormal postural alignment and control Motor - Skilled Clinical Observations: Generalized weakness, decreased balance strategies, fear of falling  Mobility Bed Mobility Bed Mobility: Supine to Sit;Sit to Supine Rolling Right: Supervision/verbal cueing Right Sidelying to Sit: Supervision/Verbal cueing Supine to Sit: Supervision/Verbal cueing;Minimal Assistance - Patient > 75% Sit to Supine: Supervision/Verbal cueing;Minimal Assistance - Patient > 75% Transfers Transfers: Sit to Stand;Stand to Sit;Lateral/Scoot Transfers;Squat Pivot Transfers Sit to  Stand: Minimal Assistance - Patient > 75%;Moderate Assistance - Patient 50-74% (facilitate R knee extension while blocking R knee bluckling by applying downward and posterior pressure just above R knee) Stand to Sit: Minimal Assistance - Patient > 75%;Moderate Assistance - Patient 50-74% Lateral/Scoot Transfers: Supervision/Verbal cueing Transfer (Assistive device): Rolling walker (slide board for lateral scoot transfers, unable to stand or squat pivot safely due to motor planning deficits) Locomotion  Gait Ambulation: Yes Gait Assistance: Minimal Assistance -  Patient > 75%;Moderate Assistance - Patient 50-74%;2 Helpers (+2 w/c follow) Gait Distance (Feet): 20 Feet Assistive device: Rolling walker Gait Assistance Details: Manual facilitation for weight shifting;Manual facilitation for weight bearing;Manual facilitation for placement;Verbal cues for technique;Verbal cues for sequencing;Verbal cues for safe use of DME/AE;Verbal cues for gait pattern Gait Assistance Details: facilitate R knee extension while blocking R knee bluckling by applying downward and posterior pressure just above R knee in stance, intermittent assist for R foot placement, +2 w/c follow due to decreased activity tolerance Stairs / Additional Locomotion Stairs: No Wheelchair Mobility Wheelchair Mobility: Yes Wheelchair Assistance: Chartered loss adjuster: Both upper extremities Wheelchair Parts Management: Needs assistance Distance: >150 ft  Trunk/Postural Assessment  Cervical Assessment Cervical Assessment: Exceptions to [facility] (forward head posture) Thoracic Assessment Thoracic Assessment: Exceptions to [facility] (kyphosis) Lumbar Assessment Lumbar Assessment: Exceptions to [facility] (posterior pelvic tilt) Postural Control Postural Control: Deficits on evaluation (decreased/delayed)  Balance Balance Balance Assessed: Yes Standardized Balance Assessment Standardized Balance Assessment: Berg Balance  Test Berg Balance Test Sit to Stand: Needs moderate or maximal assist to stand Standing Unsupported: Unable to stand 30 seconds unassisted Sitting with Back Unsupported but Feet Supported on Floor or Stool: Able to sit safely and securely 2 minutes Stand to Sit: Needs assistance to sit Transfers: Needs two people to assist of supervise to be safe Standing Unsupported with Eyes Closed: Needs help to keep from falling Standing Ubsupported with Feet Together: Needs help to attain position and unable to hold for 15 seconds From Standing, Reach Forward with Outstretched Arm: Loses balance while trying/requires external support From Standing Position, Pick up Object from Floor: Unable to try/needs assist to keep balance From Standing Position, Turn to Look Behind Over each Shoulder: Needs assist to keep from losing balance and falling Turn 360 Degrees: Needs assistance while turning Standing Unsupported, Alternately Place Feet on Step/Stool: Needs assistance to keep from falling or unable to try Standing Unsupported, One Foot in Front: Loses balance while stepping or standing Standing on One Leg: Unable to try or needs assist to prevent fall Total Score: 4 Static Sitting Balance Static Sitting - Balance Support: Feet supported;Bilateral upper extremity supported Static Sitting - Level of Assistance: 7: Independent Dynamic Sitting Balance Dynamic Sitting - Balance Support: During functional activity;Feet supported Dynamic Sitting - Level of Assistance: 7: Independent Static Standing Balance Static Standing - Balance Support: Bilateral upper extremity supported Static Standing - Level of Assistance: 4: Min assist Dynamic Standing Balance Dynamic Standing - Balance Support: Bilateral upper extremity supported;During functional activity Dynamic Standing - Level of Assistance: 4: Min assist Extremity Assessment  RLE Assessment RLE Assessment: Exceptions to [facility] Passive Range of Motion (PROM)  Comments: limited R knee extension, painful end range General Strength Comments: Grossly 3+/5 with functional mobility, limited by proprioceptive and motor planning deficits with functional activities LLE Assessment LLE Assessment: Exceptions to [facility] General Strength Comments: Grossly 3+ to 4/5 with functional mobility    Parker Odonnell PT, DPT  07/18/2021, 3:30 PM

## 2021-07-18 NOTE — Progress Notes (Signed)
Patient arrived to floor at 1600 with daughter. Patient is waiting transportation to long term facility. Delayed transfer due to weather.

## 2021-07-18 NOTE — Progress Notes (Signed)
Discharge- Inpatient Rehabilitation Medication Review by a Pharmacist  A complete drug regimen review was completed for this patient to identify any potential clinically significant medication issues.  High Risk Drug Classes Is patient taking? Indication by Medication  Antipsychotic No   Anticoagulant Yes Eliquis for afib  Antibiotic No   Opioid No   Antiplatelet No   Hypoglycemics/insulin No   Vasoactive Medication Yes Clonidine, Coreg for hTN, CHF  Chemotherapy No   Other No     Clinically significant medication issues were identified that warrant physician communication and completion of prescribed/recommended actions by midnight of the next day:  No  Time spent performing this drug regimen review (minutes):  47min  Parker Odonnell S. Alford Highland, PharmD, BCPS Clinical Staff Pharmacist [email or web address] [place] 07/18/2021 8:47 AM

## 2021-07-18 NOTE — Progress Notes (Signed)
PROGRESS NOTE   Subjective/Complaints: Patient seen laying in bed this AM. He states he slept well overnight.  He was aware of plans for discharge, however, informed of changes in plan for discharge - now changed to Monday.   ROS: Denies CP, SOB, N/V/D  Objective:   No results found. No results for input(s): WBC, HGB, HCT, PLT in the last 72 hours.    No results for input(s): NA, K, CL, CO2, GLUCOSE, BUN, CREATININE, CALCIUM in the last 72 hours.    Intake/Output Summary (Last 24 hours) at 07/18/2021 1009 Last data filed at 07/18/2021 0400 Gross per 24 hour  Intake 600 ml  Output 900 ml  Net -300 ml          Physical Exam: Vital Signs Blood pressure (!) 127/111, pulse 72, temperature 97.8 F (36.6 C), temperature source Oral, resp. rate 16, height 5\' 8"  (1.727 m), weight 65 kg, SpO2 98 %. Constitutional: No distress . Vital signs reviewed. HENT: Normocephalic.  Atraumatic. Eyes: EOMI. No discharge. Cardiovascular: No JVD.  RRR. Respiratory: Normal effort.  No stridor.  Bilateral clear to auscultation. GI: Non-distended.  BS +. Skin: Warm and dry.  Intact. Psych: Normal mood.  Normal behavior. Musc: Mild LE edema in extremities.  No tenderness in extremities. Neuro: Alert Motor: Bilateral UE 5/5, stable.  LLE 3+/5 prox to 4/5 distally.  RLE 2+ to 3-/5 prox to distal    Assessment/Plan: 1. Functional deficits which require 3+ hours per day of interdisciplinary therapy in a comprehensive inpatient rehab setting. Physiatrist is providing close team supervision and 24 hour management of active medical problems listed below. Physiatrist and rehab team continue to assess barriers to discharge/monitor patient progress toward functional and medical goals  Care Tool:  Bathing    Body parts bathed by patient: Right arm, Left arm, Chest, Abdomen, Front perineal area, Right upper leg, Left upper leg, Face, Left lower  leg, Right lower leg (LHSS)   Body parts bathed by helper: Buttocks     Bathing assist Assist Level: Minimal Assistance - Patient > 75% (EOB)     Upper Body Dressing/Undressing Upper body dressing   What is the patient wearing?: Pull over shirt    Upper body assist Assist Level: Supervision/Verbal cueing    Lower Body Dressing/Undressing Lower body dressing      What is the patient wearing?: Pants, Incontinence brief     Lower body assist Assist for lower body dressing: Maximal Assistance - Patient 25 - 49%     Toileting Toileting    Toileting assist Assist for toileting: Maximal Assistance - Patient 25 - 49%     Transfers Chair/bed transfer  Transfers assist     Chair/bed transfer assist level: Contact Guard/Touching assist Chair/bed transfer assistive device: Sliding board   Locomotion Ambulation   Ambulation assist      Assist level: Moderate Assistance - Patient 50 - 74% Assistive device: Walker-rolling Max distance: 20 ft   Walk 10 feet activity   Assist  Walk 10 feet activity did not occur: Safety/medical concerns  Assist level: Moderate Assistance - Patient - 50 - 74% Assistive device: Walker-rolling   Walk 50 feet activity  Assist Walk 50 feet with 2 turns activity did not occur: Safety/medical concerns         Walk 150 feet activity   Assist Walk 150 feet activity did not occur: Safety/medical concerns         Walk 10 feet on uneven surface  activity   Assist Walk 10 feet on uneven surfaces activity did not occur: Safety/medical concerns         Wheelchair     Assist Is the patient using a wheelchair?: Yes Type of Wheelchair: Manual Wheelchair activity did not occur: Safety/medical concerns  Wheelchair assist level: Contact Guard/Touching assist Max wheelchair distance: 70    Wheelchair 50 feet with 2 turns activity    Assist    Wheelchair 50 feet with 2 turns activity did not occur: Safety/medical  concerns   Assist Level: Contact Guard/Touching assist   Wheelchair 150 feet activity     Assist  Wheelchair 150 feet activity did not occur: Safety/medical concerns   Assist Level: Supervision/Verbal cueing   Blood pressure (!) 127/111, pulse 72, temperature 97.8 F (36.6 C), temperature source Oral, resp. rate 16, height 5\' 8"  (1.727 m), weight 65 kg, SpO2 98 %.  Medical Problem List and Plan: 1.  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  IV acyclovir completed today 06/25/2021  Plan now for discharge to SNF on Monday -Appreciate neurology input. MRI does not demonstrate any acute findings. 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  2.  Antithrombotics: -DVT/anticoagulation: Eliquis [employer]: Other (comment)             -antiplatelet therapy: N/A 3. Neuropathic pain R>L LE: - lidoderm patch            - stopped lyrica and tramadol d/t lethargy  Controlled on 9/30 4. Mood: Provide emotional support             -antipsychotic agents: N/A 5. Neuropsych: This patient is capable of making decisions on his own behalf. 6. Skin/Wound Care: Routine skin checks 7. Fluids/Electrolytes/Nutrition:             -added megace for appetite, caloric intake inconsistent  Eating 10-25% 8.  Viral leg rash/?vasculitis.  All lesions fully crusted.  Isolation discontinued  -skin bx --perivascular dermatitis  -pain better  -cbc stable 9.  Hyponatremia.                    Na 136 on 9/27 10.  Persistent atrial fibrillation.  Cardiac rate controlled.  Continue Coreg 6.25 mg twice daily/Eliquis             Heart rate controlled 9/29 11.  Diastolic congestive heart failure.  Ejection fraction 35 to 40% echocardiogram 2020.   Filed Weights   07/16/21 0500 07/17/21 0416 07/18/21 0453  Weight: 63.8 kg 64 kg 65 kg     ?Trending up on 9/30, continue to monitor 12.  Hypertension. Stopped Norvasc. Added clonidine patch right lower extremity for HTN and  neuropathic pain  Controlled on 9/30 13.  BPH/mild hydronephrosis.  Continue Flomax 0.4 mg daily. Maintain Foley tube until follow-up outpatient urology.  Patient failed voiding trial 06/21/2021  -9/24 urine remains clear, afebrile. Continue foley care 14.  Hyperlipidemia: Lipitor 15.  L1 superior endplate compression fracture/mild edema with spinous process T4-T5, sacral insufficiency fracture.  Supportive care 16.  5 mm right lower lobe pulmonary nodule versus intrapulmonary lymph -node.  Repeat noncontrast CT chest 12 months 17. Hyperkalemia: kayexalate lokelma give so far  Potassium 4.2 on 9/27 18. Slow transit constipation, recent, associated nausea   -moved bowels 9/27 19. Chronic thrombocytopenia is stable  Platelets 125 on 9/27  LOS: 23 days A FACE TO FACE EVALUATION WAS PERFORMED  Parker Odonnell Parker Odonnell 07/18/2021, 10:09 AM

## 2021-07-18 NOTE — Progress Notes (Signed)
Inpatient Rehabilitation Care Coordinator Discharge Note   Patient Details  Name: Parker Odonnell. MRN: [id number] Date of Birth: 1932/10/16   Discharge location: [facility]; Nurse Report# [phone]  Length of Stay: 23 days  Discharge activity level: Minimal Assistance  Home/community participation: Limited  Patient response QX:IHWTUU Literacy - How often do you need to have someone help you when you read instructions, pamphlets, or other written material from your doctor or pharmacy?: Often  Patient response EK:CMKLKJ Isolation - How often do you feel lonely or isolated from those around you?: Never  Services provided included: MD, RD, PT, OT, SLP, RN, CM, Pharmacy, TR, Neuropsych, SW  Financial Services:  Charity fundraiser Utilized: [place] [employer]  Choices offered to/list presented to: Yes  Follow-up services arranged:  Other (Comment) (N/A- SNF)           Patient response to transportation need: Is the patient able to respond to transportation needs?: Yes In the past 12 months, has lack of transportation kept you from medical appointments or from getting medications?: No In the past 12 months, has lack of transportation kept you from meetings, work, or from getting things needed for daily living?: No  Comments (or additional information):  Patient/Family verbalized understanding of follow-up arrangements:  Other (Comment) (Pt dtr [name] primary contact)  Individual responsible for coordination of the follow-up plan: Pt dtr [name] [phone]  Confirmed correct DME delivered: Rana Snare 07/18/2021    Rana Snare

## 2021-07-18 NOTE — Progress Notes (Signed)
Patient ID: Parker Sink., male   DOB: [birth year], 88 y.o.   MRN: [id number]  Pt d/c to SNF on today. [facility] arranged for 1:30 PM  Erlene Quan, [place]

## 2021-07-18 NOTE — Progress Notes (Signed)
Speech Language Pathology Discharge Summary  Patient Details  Name: Parker Odonnell. MRN: [id number] Date of Birth: [birth year]  Patient has met 1 of 1 long term goals.  Patient to discharge at overall Mod level.   Reasons goals not met: N/A   Clinical Impression/Discharge Summary: Patient has made minimal and inconsistent gains and has met 1 of 1 LTG this admission. Patient continues to demonstrate overall severe cognitive impairments impacting his ability to complete functional and familiar task safely. However, patient demonstrates improved orientation due to use of external memory aids. Patient's biggest barrier to progress was his overall level of fluctuation in regards to cognitive functioning. Patient and family education is complete. Patient's family is unable to provide the level of cognitive and physical assistance needed at this time, therefore, patient will discharge to a SNF. Patient would benefit from f/u SLP services to maximize his cognitive functioning in order to reduce caregiver burden.   Care Partner:  Caregiver Able to Provide Assistance: No  Type of Caregiver Assistance: Physical;Cognitive  Recommendation:  24 hour supervision/assistance;Skilled Nursing facility  Rationale for SLP Follow Up: Reduce caregiver burden;Maximize cognitive function and independence   Equipment: N/A   Reasons for discharge: Discharged from hospital;Treatment goals met   Patient/Family Agrees with Progress Made and Goals Achieved: Yes    Nysha Koplin 07/18/2021, 6:33 AM

## 2021-07-18 NOTE — Progress Notes (Signed)
Patient ID: [place] Sink., male   DOB: 07/02/1932, 87 y.o.   MRN: [id number]  SW received updates from [facility] that there is now a bed available for today only. SW left message for pt dtr [name] requesting return phone call to discuss bed opportunity, and stressed all risks associated with withdrawing bed offer with [facility] and submitting new auth with insurance. SW waiting on follow-up.  *SW received return phone call from pt dtr [name] who reported they would like to withdraw bed at [facility] and pursue [facility]. SW cancelled [facility] ([phone]), and updated medical team members on changes.   SW spoke with [facility] [phone]) to discuss change in facility. Reports she can see that appeal was overturned in their system. Confirms pt can transition to SNF today effective 9/29. States that will open new ticket for new facility-[facility], and will call back with Auth #. SW rescheduled [facility] ambulance pick up at 1:30pm. SW updated medical team and pt dtr [name] on above.   SW received call back from Lisa[facility] providing the following:  Auth [id number]; fax [phone].    Loralee Pacas, MSW, [place] Office: [phone] Cell: [phone] Fax: [phone]

## 2021-07-18 NOTE — Progress Notes (Signed)
Addendum: Patient now to discharge to SNF today.

## 2021-07-18 NOTE — Progress Notes (Signed)
Patient ID: [place] Sink., male   DOB: 03/28/1932, 87 y.o.   MRN: [id number] Pt moving to [address] due to closing [address]. Have contacted [facility] to update them on his new room.

## 2021-07-24 ENCOUNTER — Other Ambulatory Visit: Payer: Self-pay | Admitting: Interventional Cardiology

## 2021-07-24 NOTE — Telephone Encounter (Signed)
Eliquis 2.5mg  refill request received. Patient is 87 years old, weight-65kg, Crea-0.94 on 07/15/2021, Diagnosis-Afib, and last seen by Dr. Irish Lack on 01/15/2021. Dose is inappropriate based on dosing criteria. Per last discussion with Dr. Irish Lack regarding the pt's dose, he would evaluate at the 01/15/21 appt. The pt attended Dr. Hassell Done 01/15/21 appt & note states "Eliquis dose was decreased as well." Will send another message to clarify to ensure he wants to keep the Eliquis dose at 2.5mg  BID.

## 2021-07-30 ENCOUNTER — Telehealth: Payer: Self-pay | Admitting: *Deleted

## 2021-07-30 NOTE — Telephone Encounter (Signed)
-----   Message from Jettie Booze, MD sent at 07/30/2021 12:05 PM EDT ----- He is pretty frail so would continue the 2.5 mg dose.   JV ----- Message ----- From: Marcos Eke, RN Sent: 07/29/2021  10:50 AM EDT To: Jettie Booze, MD  Good Morning,  I just want to ensure you want the patient to continue with the decreased dose of Eliquis as he is pending a refill. The patient is 87 years old, weight-65kg, Crea-0.94 on 07/15/2021, and Diagnosis-Afib. Per last discussion you were going to re-evaluate on 01/15/21 appt. The pt attended appt on 01/15/21 appt & note states "Eliquis dose was decreased as well." Before sending in the refill, just want to clarify if you want to keep the pt on Eliquis dose at 2.5mg  BID. Please let me know if you want to continue lower dose and I will take care of it.  Thanks, Derrel Nip, RN

## 2021-07-30 NOTE — Telephone Encounter (Signed)
Please see information below to continue Eliquis 2.5mg  BID.    07/29/2021 Jettie Booze, MD  Walida Cajas, Clay B, RN He is pretty frail so would continue the 2.5 mg dose.   JV        Previous Messages   ----- Message -----  From: Marcos Eke, RN  Sent: 07/29/2021  10:50 AM EDT  To: Jettie Booze, MD   Good Morning,   I just want to ensure you want the patient to continue with the decreased dose of Eliquis as he is pending a refill. The patient is 87 years old, weight-65kg, Crea-0.94 on 07/15/2021, and Diagnosis-Afib. Per last discussion you were going to re-evaluate on 01/15/21 appt. The pt attended appt on 01/15/21 appt & note states "Eliquis dose was decreased as well." Before sending in the refill, just want to clarify if you want to keep the pt on Eliquis dose at 2.5mg  BID. Please let me know if you want to continue lower dose and I will take care of it.   Thanks,  Derrel Nip, RN

## 2021-08-10 ENCOUNTER — Encounter (HOSPITAL_COMMUNITY): Payer: Self-pay | Admitting: Family Medicine

## 2021-08-10 ENCOUNTER — Other Ambulatory Visit: Payer: Self-pay

## 2021-08-10 ENCOUNTER — Emergency Department (HOSPITAL_COMMUNITY): Payer: Medicare Other

## 2021-08-10 ENCOUNTER — Inpatient Hospital Stay (HOSPITAL_COMMUNITY)
Admission: EM | Admit: 2021-08-10 | Discharge: 2021-08-14 | DRG: 726 | Disposition: A | Discharge status: Home Health | Payer: Medicare Other | Attending: Internal Medicine | Admitting: Internal Medicine

## 2021-08-10 DIAGNOSIS — L89152 Pressure ulcer of sacral region, stage 2: Secondary | ICD-10-CM | POA: Diagnosis present

## 2021-08-10 DIAGNOSIS — I5032 Chronic diastolic (congestive) heart failure: Secondary | ICD-10-CM | POA: Diagnosis present

## 2021-08-10 DIAGNOSIS — Z9049 Acquired absence of other specified parts of digestive tract: Secondary | ICD-10-CM | POA: Diagnosis not present

## 2021-08-10 DIAGNOSIS — Z66 Do not resuscitate: Secondary | ICD-10-CM | POA: Diagnosis present

## 2021-08-10 DIAGNOSIS — I10 Essential (primary) hypertension: Secondary | ICD-10-CM | POA: Diagnosis present

## 2021-08-10 DIAGNOSIS — N390 Urinary tract infection, site not specified: Secondary | ICD-10-CM | POA: Diagnosis present

## 2021-08-10 DIAGNOSIS — I480 Paroxysmal atrial fibrillation: Secondary | ICD-10-CM | POA: Diagnosis present

## 2021-08-10 DIAGNOSIS — E785 Hyperlipidemia, unspecified: Secondary | ICD-10-CM | POA: Diagnosis present

## 2021-08-10 DIAGNOSIS — N401 Enlarged prostate with lower urinary tract symptoms: Principal | ICD-10-CM | POA: Diagnosis present

## 2021-08-10 DIAGNOSIS — R338 Other retention of urine: Secondary | ICD-10-CM | POA: Diagnosis present

## 2021-08-10 DIAGNOSIS — Z8249 Family history of ischemic heart disease and other diseases of the circulatory system: Secondary | ICD-10-CM | POA: Diagnosis not present

## 2021-08-10 DIAGNOSIS — D649 Anemia, unspecified: Secondary | ICD-10-CM | POA: Diagnosis present

## 2021-08-10 DIAGNOSIS — Z87440 Personal history of urinary (tract) infections: Secondary | ICD-10-CM

## 2021-08-10 DIAGNOSIS — Z20822 Contact with and (suspected) exposure to covid-19: Secondary | ICD-10-CM | POA: Diagnosis present

## 2021-08-10 DIAGNOSIS — I110 Hypertensive heart disease with heart failure: Secondary | ICD-10-CM | POA: Diagnosis present

## 2021-08-10 DIAGNOSIS — Z87442 Personal history of urinary calculi: Secondary | ICD-10-CM

## 2021-08-10 DIAGNOSIS — Z96642 Presence of left artificial hip joint: Secondary | ICD-10-CM | POA: Diagnosis present

## 2021-08-10 DIAGNOSIS — M792 Neuralgia and neuritis, unspecified: Secondary | ICD-10-CM | POA: Diagnosis present

## 2021-08-10 DIAGNOSIS — Z955 Presence of coronary angioplasty implant and graft: Secondary | ICD-10-CM

## 2021-08-10 DIAGNOSIS — I447 Left bundle-branch block, unspecified: Secondary | ICD-10-CM | POA: Diagnosis present

## 2021-08-10 DIAGNOSIS — R310 Gross hematuria: Secondary | ICD-10-CM | POA: Diagnosis present

## 2021-08-10 DIAGNOSIS — Z79899 Other long term (current) drug therapy: Secondary | ICD-10-CM | POA: Diagnosis not present

## 2021-08-10 DIAGNOSIS — K219 Gastro-esophageal reflux disease without esophagitis: Secondary | ICD-10-CM | POA: Diagnosis present

## 2021-08-10 DIAGNOSIS — Z7901 Long term (current) use of anticoagulants: Secondary | ICD-10-CM | POA: Diagnosis not present

## 2021-08-10 DIAGNOSIS — I2510 Atherosclerotic heart disease of native coronary artery without angina pectoris: Secondary | ICD-10-CM | POA: Diagnosis present

## 2021-08-10 LAB — URINALYSIS, ROUTINE W REFLEX MICROSCOPIC

## 2021-08-10 LAB — URINALYSIS, MICROSCOPIC (REFLEX)

## 2021-08-10 LAB — CBC WITH DIFFERENTIAL/PLATELET
Abs Immature Granulocytes: 0.02 10*3/uL (ref 0.00–0.07)
Basophils Absolute: 0 10*3/uL (ref 0.0–0.1)
Basophils Relative: 0 %
Eosinophils Absolute: 0.2 10*3/uL (ref 0.0–0.5)
Eosinophils Relative: 3 %
HCT: 35.8 % — ABNORMAL LOW (ref 39.0–52.0)
Hemoglobin: 11.3 g/dL — ABNORMAL LOW (ref 13.0–17.0)
Immature Granulocytes: 0 %
Lymphocytes Relative: 13 %
Lymphs Abs: 0.7 10*3/uL (ref 0.7–4.0)
MCH: 32.1 pg (ref 26.0–34.0)
MCHC: 31.6 g/dL (ref 30.0–36.0)
MCV: 101.7 fL — ABNORMAL HIGH (ref 80.0–100.0)
Monocytes Absolute: 0.3 10*3/uL (ref 0.1–1.0)
Monocytes Relative: 6 %
Neutro Abs: 4.6 10*3/uL (ref 1.7–7.7)
Neutrophils Relative %: 78 %
Platelets: 191 10*3/uL (ref 150–400)
RBC: 3.52 MIL/uL — ABNORMAL LOW (ref 4.22–5.81)
RDW: 14.3 % (ref 11.5–15.5)
WBC: 5.9 10*3/uL (ref 4.0–10.5)
nRBC: 0 % (ref 0.0–0.2)

## 2021-08-10 LAB — HEPATIC FUNCTION PANEL
ALT: 31 U/L (ref 0–44)
AST: 29 U/L (ref 15–41)
Albumin: 2.7 g/dL — ABNORMAL LOW (ref 3.5–5.0)
Alkaline Phosphatase: 87 U/L (ref 38–126)
Bilirubin, Direct: 0.4 mg/dL — ABNORMAL HIGH (ref 0.0–0.2)
Indirect Bilirubin: 1.7 mg/dL — ABNORMAL HIGH (ref 0.3–0.9)
Total Bilirubin: 2.1 mg/dL — ABNORMAL HIGH (ref 0.3–1.2)
Total Protein: 6.5 g/dL (ref 6.5–8.1)

## 2021-08-10 LAB — BASIC METABOLIC PANEL
Anion gap: 7 (ref 5–15)
BUN: 20 mg/dL (ref 8–23)
CO2: 25 mmol/L (ref 22–32)
Calcium: 9.7 mg/dL (ref 8.9–10.3)
Chloride: 105 mmol/L (ref 98–111)
Creatinine, Ser: 0.96 mg/dL (ref 0.61–1.24)
GFR, Estimated: >60 mL/min (ref >60)
Glucose, Bld: 116 mg/dL — ABNORMAL HIGH (ref 70–99)
Potassium: 3.6 mmol/L (ref 3.5–5.1)
Sodium: 137 mmol/L (ref 135–145)

## 2021-08-10 LAB — RESP PANEL BY RT-PCR (FLU A&B, COVID) ARPGX2
Influenza A by PCR: NEGATIVE
Influenza B by PCR: NEGATIVE
SARS Coronavirus 2 by RT PCR: NEGATIVE

## 2021-08-10 LAB — PROTIME-INR
INR: 1.7 — ABNORMAL HIGH (ref 0.8–1.2)
Prothrombin Time: 20.2 seconds — ABNORMAL HIGH (ref 11.4–15.2)

## 2021-08-10 MED ORDER — CARVEDILOL 6.25 MG PO TABS
6.2500 mg | ORAL_TABLET | Freq: Two times a day (BID) | ORAL | Status: DC
  Administered 2021-08-11 – 2021-08-14 (×8): 6.25 mg via ORAL
  Filled 2021-08-10 (×3): qty 1
  Filled 2021-08-10: qty 2
  Filled 2021-08-10: qty 1
  Filled 2021-08-10: qty 2
  Filled 2021-08-10 (×2): qty 1

## 2021-08-10 MED ORDER — ATORVASTATIN CALCIUM 40 MG PO TABS
40.0000 mg | ORAL_TABLET | Freq: Every day | ORAL | Status: DC
  Administered 2021-08-12 – 2021-08-13 (×2): 40 mg via ORAL
  Filled 2021-08-10 (×2): qty 1

## 2021-08-10 MED ORDER — LACTATED RINGERS IV SOLN: INTRAVENOUS | Status: AC

## 2021-08-10 MED ORDER — STERILE WATER FOR IRRIGATION IR SOLN: Freq: Once | Status: AC

## 2021-08-10 MED ORDER — PROSIGHT PO TABS
1.0000 | ORAL_TABLET | Freq: Every day | ORAL | Status: DC
  Administered 2021-08-11 – 2021-08-12 (×2): 1 via ORAL
  Filled 2021-08-10 (×2): qty 1

## 2021-08-10 MED ORDER — TAMSULOSIN HCL 0.4 MG PO CAPS
0.4000 mg | ORAL_CAPSULE | Freq: Every day | ORAL | Status: DC
  Administered 2021-08-11 – 2021-08-14 (×4): 0.4 mg via ORAL
  Filled 2021-08-10 (×4): qty 1

## 2021-08-10 MED ORDER — ACETAMINOPHEN 650 MG RE SUPP: 650.0000 mg | Freq: Four times a day (QID) | RECTAL | Status: DC | PRN

## 2021-08-10 MED ORDER — HYDROCODONE-ACETAMINOPHEN 5-325 MG PO TABS
1.0000 | ORAL_TABLET | Freq: Four times a day (QID) | ORAL | Status: DC | PRN
Start: 2021-08-10 — End: 2021-08-14
  Administered 2021-08-11 – 2021-08-14 (×6): 1 via ORAL
  Filled 2021-08-10 (×6): qty 1

## 2021-08-10 MED ORDER — SODIUM CHLORIDE 0.9 % IV SOLN
1.0000 g | Freq: Once | INTRAVENOUS | Status: AC
  Administered 2021-08-10: 1 g via INTRAVENOUS
  Filled 2021-08-10: qty 10

## 2021-08-10 MED ORDER — SODIUM CHLORIDE 0.9 % IV BOLUS
1000.0000 mL | Freq: Once | INTRAVENOUS | Status: AC
  Administered 2021-08-10: 1000 mL via INTRAVENOUS

## 2021-08-10 MED ORDER — HYDROCODONE-ACETAMINOPHEN 5-325 MG PO TABS
1.0000 | ORAL_TABLET | Freq: Once | ORAL | Status: AC
  Administered 2021-08-10: 1 via ORAL
  Filled 2021-08-10: qty 1

## 2021-08-10 MED ORDER — SODIUM CHLORIDE 0.9 % IR SOLN: 3000.0000 mL | Status: DC

## 2021-08-10 MED ORDER — POLYETHYLENE GLYCOL 3350 17 G PO PACK: 17.0000 g | PACK | Freq: Every day | ORAL | Status: DC | PRN

## 2021-08-10 MED ORDER — ACETAMINOPHEN 325 MG PO TABS
650.0000 mg | ORAL_TABLET | Freq: Four times a day (QID) | ORAL | Status: DC | PRN
  Administered 2021-08-12 – 2021-08-13 (×3): 650 mg via ORAL
  Filled 2021-08-10 (×3): qty 2

## 2021-08-10 MED ORDER — SODIUM CHLORIDE 0.9 % IV SOLN
1.0000 g | INTRAVENOUS | Status: DC
  Administered 2021-08-11 – 2021-08-14 (×4): 1 g via INTRAVENOUS
  Filled 2021-08-10 (×4): qty 10

## 2021-08-10 NOTE — ED Notes (Signed)
Daughter at bedside.

## 2021-08-10 NOTE — H&P (Signed)
History and Physical    Parker Odonnell. XOV:[id number] DOB: 07/11/1932 DOA: 08/10/2021  PCP: Lawerance Cruel, MD   Patient coming from: SNF  Chief Complaint: Bloody urine  HPI: Parker Odonnell. is a 87 y.o. male with medical history significant for PAF on eliquis, postherpetic neuralgia, debility, CAD, HFpEF, HLD, HTN, DNR who presents with grossly bloody urine. Has had a catheter was placed when he was admitted in August for herpes zoster and had urinary retention at that time.  Daughter is at bedside and is unsure if nursing home has been cleaning the Foley catheter regularly or if it even been changed since he was placed in August.  Today when she was visiting him she noticed that he had grossly bloody urine with clots.  He was brought to the emergency room for further evaluation.  He has not had any fever.  He was treated last week for UTI and started on Cipro 3 days ago.  He was on Keflex for a few days prior to the Cipro being started for UTI.  No urine culture was sent by the nursing home according to the daughter or records that were sent from the nursing home.  Patient reports he does not remember seeing blood in his urine.  He has not had fevers or chills.  Daughter reports he has had some dizziness and has generalized weakness since his hospitalization.  Dizziness has gotten some better since having Lyrica stopped and he is now on gabapentin for the last week according to the daughter. He smoked a pipe intermittently when he was younger but has not smoked in over 50 years.  No alcohol use.  ED Course: Mr. Steele has been hemodynamically stable in the emergency room.  Continuous bladder irrigation was started in the emergency room.  ER physician discussed with urology in consultation regarding Mr. Wengert.  Lab work reveals sodium 137 potassium 3.6 chloride 105 bicarb 25 creatinine 0.96 BUN 20 glucose 116 calcium 9.7 alk phos is 87 albumin 2.7 AST 29 ALT 31 bilirubin 0.1 with 1.7 being  indirect, WBC 5900 hemoglobin 11.3 hematocrit 35.8 platelet 291,000.  INR 1.7, urinalysis grossly bloody and unable to determine microscopic quantification due to blood.  Renal CT stone study showed BPH but no obstructing stones.  There were some small nonobstructing stones noted.  Hospitalist service been asked to admit for further management  Review of Systems:  General: Denies fever, chills, weight loss, night sweats. Denies change in appetite HENT: Denies head trauma, headache, denies change in hearing, tinnitus.  Denies nasal bleeding. Denies sore throat.  Denies difficulty swallowing Eyes: Denies blurry vision, pain in eye, drainage.  Denies discoloration of eyes. Neck: Denies pain.  Denies swelling.  Denies pain with movement. Cardiovascular: Denies chest pain, palpitations.  Denies edema.  Denies orthopnea Respiratory: Denies shortness of breath, cough.  Denies wheezing.  Denies sputum production Gastrointestinal: Reports lower abdominal pain.  Denies nausea, vomiting, diarrhea.  Denies melena.  Denies hematemesis. Musculoskeletal: Denies limitation of movement.  Denies deformity or swelling.  Genitourinary: reports hematuria. Denies pelvic pain.  Denies urinary frequency or hesitancy.  Denies dysuria.  Skin: Denies rash.  Denies petechiae, purpura, ecchymosis. Neurological: Denies headache.  Denies syncope.  Denies seizure activity.  Denies weakness or paresthesia.  Denies slurred speech, drooping face.  Denies visual change. Psychiatric: Denies depression, anxiety. Denies hallucinations.  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  Cancer [facility])    CHF (congestive heart failure) (HCC)    Coronary atherosclerosis of native coronary artery    DNR (do not resuscitate) 06/10/2021   Dyslipidemia    ED (erectile dysfunction)    GERD (gastroesophageal reflux disease)    hx of   History of kidney stones     Hypertension    Inguinal hernia    Left bundle branch block    chronic   Postsurgical percutaneous transluminal coronary angioplasty status    Shingles     Past Surgical History:  Procedure Laterality Date   [place] TEST  05/25/12   [place]   INGUINAL HERNIA REPAIR  06/15/2012   Procedure: HERNIA REPAIR INGUINAL ADULT;  Surgeon: Adin Hector, MD;  Location: [facility];  Service: General;  Laterality: Bilateral;  Bilateral Inguinal Hernia Repair with Ultrapro Mesh   MOHS SURGERY     RIGHT/LEFT HEART CATH AND CORONARY ANGIOGRAPHY N/A 11/13/2019   Procedure: RIGHT/LEFT HEART CATH AND CORONARY ANGIOGRAPHY;  Surgeon: Jettie Booze, MD;  Location: [facility];  Service: Cardiovascular;  Laterality: N/A;   TOTAL HIP ARTHROPLASTY Left 01/10/2020   Procedure: TOTAL HIP ARTHROPLASTY ANTERIOR APPROACH;  Surgeon: Gaynelle Arabian, MD;  Location: [facility];  Service: Orthopedics;  Laterality: Left;  120mn    Social History  reports that he has never smoked. He has never used smokeless tobacco. He reports that he does not drink alcohol and does not use drugs.  No Known Allergies  Family History  Problem Relation Age of Onset   Arthritis Father    Hypertension Paternal Uncle    Heart attack Neg Hx    Stroke Neg Hx      Prior to Admission medications   Medication Sig Start Date End Date Taking? Authorizing Provider  acetaminophen (TYLENOL) 325 MG tablet Take 2 tablets (650 mg total) by mouth every 6 (six) hours as needed for mild pain (or Fever >/= 101). 06/25/21   Elgergawy, DSilver Huguenin MD  atorvastatin (LIPITOR) 40 MG tablet TAKE ONE TABLET BY MOUTH EVERYDAY AT BEDTIME 01/31/21   VJettie Booze MD  b complex vitamins tablet Take 1 tablet by mouth daily.    [provider]  carvedilol (COREG) 6.25 MG tablet Take 1 tablet (6.25 mg total) by mouth 2 (two) times daily with a meal.  06/25/21   Elgergawy, DSilver Huguenin MD  cloNIDine (CATAPRES - DOSED IN MG/24 HR) 0.1 mg/24hr patch Place 1 patch (0.1 mg total) onto the skin once a week. 07/21/21   Angiulli, DLavon Paganini PA-C  ELIQUIS 2.5 MG TABS tablet TAKE ONE TABLET BY MOUTH EVERY MORNING and TAKE ONE TABLET BY MOUTH EVERYDAY AT BEDTIME 07/30/21   VJettie Booze MD  lidocaine (LIDODERM) 5 % Place 1 patch onto the skin daily. Remove & Discard patch within 12 hours or as directed by MD 07/18/21   Angiulli, DLavon Paganini PA-C  meclizine (ANTIVERT) 12.5 MG tablet Take 1 tablet (12.5 mg total) by mouth 3 (three) times daily as needed for dizziness. 07/17/21   Angiulli, DLavon Paganini PA-C  Multiple Vitamin (MULTIVITAMIN WITH MINERALS) TABS tablet Take 1 tablet by mouth every other day. [facility]  Spectravite Multivitamin    [provider]  Multiple Vitamins-Minerals (OCUVITE-LUTEIN PO) Take 1 capsule by mouth daily.    [provider]  polyethylene glycol (MIRALAX / GLYCOLAX) 17 g packet Take 17 g by mouth  daily. 07/18/21   Angiulli, Lavon Paganini, PA-C  tamsulosin (FLOMAX) 0.4 MG CAPS capsule Take 1 capsule (0.4 mg total) by mouth daily. 06/26/21   Elgergawy, Silver Huguenin, MD    Physical Exam: Vitals:   08/10/21 1846 08/10/21 1847 08/10/21 1900 08/10/21 1915  BP:   125/75 140/61  Pulse: 74 74 73 60  Resp:      Temp:      TempSrc:      SpO2: 98% 94% 99% 97%    Constitutional: NAD, calm, comfortable Vitals:   08/10/21 1846 08/10/21 1847 08/10/21 1900 08/10/21 1915  BP:   125/75 140/61  Pulse: 74 74 73 60  Resp:      Temp:      TempSrc:      SpO2: 98% 94% 99% 97%   General: WDWN, Alert and oriented x3.  Eyes: EOMI, PERRL, conjunctivae normal.  Sclera nonicteric HENT:  [place]/AT, external ears normal.  Nares patent without epistasis.  Mucous membranes are dry. Posterior pharynx clear Neck: Soft, normal range of motion, supple, no masses,Trachea midline Respiratory: clear to auscultation bilaterally, no wheezing, no crackles. Normal  respiratory effort. No accessory muscle use.  Cardiovascular: Regular rate and rhythm, no murmurs / rubs / gallops. No extremity edema. 1+ pedal pulses.  Abdomen: Soft, diffuse lower and suprapubic tenderness, nondistended, no rebound or guarding.  No masses palpated.  Bowel sounds normoactive Musculoskeletal: FROM. no cyanosis. No joint deformity upper and lower extremities. Normal muscle tone.  Skin: Warm, dry, intact no rashes, lesions, ulcers. No induration Neurologic: CN 2-12 grossly intact.  Normal speech. Strength 4/5 in all extremities.   Psychiatric: Normal judgment and insight.  Normal mood.    Labs on Admission: I have personally reviewed following labs and imaging studies  CBC: Recent Labs  Lab 08/10/21 1109  WBC 5.9  NEUTROABS 4.6  HGB 11.3*  HCT 35.8*  MCV 101.7*  PLT 710    Basic Metabolic Panel: Recent Labs  Lab 08/10/21 1109  NA 137  K 3.6  CL 105  CO2 25  GLUCOSE 116*  BUN 20  CREATININE 0.96  CALCIUM 9.7    GFR: CrCl cannot be calculated (Unknown ideal weight.).  Liver Function Tests: Recent Labs  Lab 08/10/21 1109  AST 29  ALT 31  ALKPHOS 87  BILITOT 2.1*  PROT 6.5  ALBUMIN 2.7*    Urine analysis:    Component Value Date/Time   COLORURINE RED (A) 08/10/2021 1109   APPEARANCEUR TURBID (A) 08/10/2021 1109   LABSPEC  08/10/2021 1109    TEST NOT REPORTED DUE TO COLOR INTERFERENCE OF URINE PIGMENT   PHURINE  08/10/2021 1109    TEST NOT REPORTED DUE TO COLOR INTERFERENCE OF URINE PIGMENT   GLUCOSEU (A) 08/10/2021 1109    TEST NOT REPORTED DUE TO COLOR INTERFERENCE OF URINE PIGMENT   HGBUR (A) 08/10/2021 1109    TEST NOT REPORTED DUE TO COLOR INTERFERENCE OF URINE PIGMENT   BILIRUBINUR (A) 08/10/2021 1109    TEST NOT REPORTED DUE TO COLOR INTERFERENCE OF URINE PIGMENT   KETONESUR (A) 08/10/2021 1109    TEST NOT REPORTED DUE TO COLOR INTERFERENCE OF URINE PIGMENT   PROTEINUR (A) 08/10/2021 1109    TEST NOT REPORTED DUE TO COLOR  INTERFERENCE OF URINE PIGMENT   UROBILINOGEN 0.2 06/13/2012 1027   NITRITE (A) 08/10/2021 1109    TEST NOT REPORTED DUE TO COLOR INTERFERENCE OF URINE PIGMENT   LEUKOCYTESUR (A) 08/10/2021 1109    TEST NOT REPORTED DUE  TO COLOR INTERFERENCE OF URINE PIGMENT    Radiological Exams on Admission: CT Renal Stone Study  Result Date: 08/10/2021 CLINICAL DATA:  Hematuria.  Foley catheter since Friday. EXAM: CT ABDOMEN AND PELVIS WITHOUT CONTRAST TECHNIQUE: Multidetector CT imaging of the abdomen and pelvis was performed following the standard protocol without IV contrast. COMPARISON:  06/14/2021 FINDINGS: Lower chest: Motion degraded images. No acute abnormality. Coronary artery calcifications noted. Hepatobiliary: Cholecystectomy.  Liver is homogeneous. Pancreas: Unremarkable. No pancreatic ductal dilatation or surrounding inflammatory changes. Spleen: Normal in size without focal abnormality. Adrenals/Urinary Tract: The adrenal glands are normal. LEFT kidney: 5.5 centimeters cyst in the UPPER pole region. Intrarenal calculi measure 2-3 millimeters throughout the LEFT kidney. There is no hydronephrosis. Parapelvic cysts are noted. The LEFT ureter is unremarkable. RIGHT kidney: Intrarenal calculi measure 2-4 millimeters throughout the RIGHT kidney. There is no hydronephrosis. Parapelvic renal cysts are present. The RIGHT ureter is unremarkable. Bladder: A Foley catheter decompresses the urinary bladder. There is significant streak artifact from hardware in the pelvis which limits evaluation of the distal ureters and urinary bladder. Stomach/Bowel: Stomach and small bowel loops are normal in appearance. There are numerous colonic diverticula. No associated inflammatory changes. Average stool burden. The appendix is not well seen. Vascular/Lymphatic: There is atherosclerotic calcification of the abdominal aorta. No associated aneurysm. There is normal vascular opacification of the celiac axis, superior mesenteric  artery, and inferior mesenteric artery. Normal appearance of the portal venous system and inferior vena cava. No retroperitoneal or mesenteric adenopathy. Reproductive: The prostate is significantly enlarged. Prostatic calcifications are noted. Other: No ascites.  Abdominal wall is unremarkable. Musculoskeletal: Significant degenerative changes in the RIGHT hip. Status post LEFT hip arthroplasty, with significant streak artifact. Moderate degenerative changes throughout the lumbar spine. Stable appearance of superior endplate fracture of L1. IMPRESSION: 1. Multiple nonobstructing intrarenal calculi bilaterally. No evidence for ureteral abnormality. Foley catheter. Streak artifact limits full evaluation of the distal ureters and urinary bladder. However, no obstruction is suspected. 2. Marked enlargement of prostate, similar in appearance to prior study. 3. Colonic diverticulosis without acute diverticulitis. 4.  Aortic atherosclerosis.  (ICD10-I70.0) 5. Status post LEFT hip arthroplasty. Degenerative changes of the RIGHT hip. 6. Stable L1 compression fracture. Electronically Signed   By: Nolon Nations M.D.   On: 08/10/2021 12:14    EKG: Independently reviewed.  EKG shows normal sinus rhythm with nonspecific ST changes but no acute ST elevation or depression.  QTc 468  Assessment/Plan Principal Problem:   Gross hematuria Mr. Verga is admitted to Med-Surg floor.  Continuous bladder irrigation started in ER per Urology recommendation. Urology consulted by Er physician and will see in am.  Active Problems:   Essential hypertension Continue home medication of coreg and monitor BP.     Chronic diastolic congestive heart failure  Stable.     Neuropathic pain Has been present since having shingles in August of 2022. Is on gabapentin. Vicodin for breakthru pain.     Coronary atherosclerosis of native coronary artery Chronic, stable.     Complicated UTI (urinary tract infection) Rocephin for  antibiotic coverage. Has been on cipro for past 3 days at SNF but with side effects of cipro will convert to rocephin. Urine culture sent by ER and will be monitored.      Weakness Consult PT for evaluation. Fall precautions    DNR (do not resuscitate)     DVT prophylaxis: Has been on eliquis which is stopped with gross hematuria. SCDs for DVT prophylaxis.  Code Status:   DNR, Form at bedside.  Family Communication:  Diagnosis and plan discussed with patient and his daughter who is at bedside.  They verbalized understanding agree with plan.  Further recommendations to follow as clinically indicated.  Questions answered Disposition Plan:   Patient is from:  SNF  Anticipated DC to:  Patient is going to going home with 24-hour care arranged by family according to the daughter  Anticipated DC date:  Anticipate 2 midnight or more stay in the hospital  Consults called:  Urology consulted by ER physician  Admission status:  Inpatient   Yevonne Aline Kellen Dutch MD [facility]  How to contact the [facility] Attending or Consulting provider [place] or covering provider during after hours 7P -7A, for this patient?   Check the care team in [facility] and look for a) attending/consulting [facility] provider listed and b) the [facility] team listed Log into [email or web address] and use [place]'s universal password to access. If you do not have the password, please contact the hospital operator. Locate the [facility] provider you are looking for under [facility] and page to a number that you can be directly reached. If you still have difficulty reaching the provider, please page the [facility] (Director on Call) for the Hospitalists listed on amion for assistance.  08/10/2021, 8:05 PM

## 2021-08-10 NOTE — ED Notes (Signed)
Patient transported to CT 

## 2021-08-10 NOTE — ED Triage Notes (Signed)
Pt BIB EMS due to hematuria. Pt is from [facility]. Pt has had a foley since Friday. VSS.

## 2021-08-10 NOTE — ED Notes (Signed)
Pt's indwelling foley catheter was replaced per EDP Haviland's order & a three way catheter was inserted for continuous bladder irrigation started with a 3 Liter bag of sterile water. Pt is tolerating this well so far, EDP Haviland's aware of pt status & his daughter is at bedside.

## 2021-08-10 NOTE — ED Provider Notes (Addendum)
[name] Arrival date & time: 08/10/21  1042     History Chief Complaint  Patient presents with   Hematuria    Parker Odonnell. is a 87 y.o. male.  Pt presents to the ED today with blood in urine.  Pt is on Eliquis for afib.  He is from a SNF.  They noticed blood in his urine on Friday, 10/21.  He has had a foley in place since he was hospitalized in August.  He was supposed to leave the facility on Saturday and when his daughter came to get him, she noticed the blood.  It irrigated by the facility.  He has been on keflex and cipro.  No urine cultures sent with patient.  He continues to pass blood.  Pt denies any pain.  He was not aware he had blood in his urine.  The daughter does not know when the foley was changed last.  He has not seen urology for his urinary retention.       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   Cancer (HCC)    CHF (congestive heart failure) ([place])    Coronary atherosclerosis of native coronary artery    DNR (do not resuscitate) 06/10/2021   Dyslipidemia    ED (erectile dysfunction)    GERD (gastroesophageal reflux disease)    hx of   History of kidney stones    Hypertension    Inguinal hernia    Left bundle branch block    chronic   Postsurgical percutaneous transluminal coronary angioplasty status    Shingles     Patient Active Problem List   Diagnosis Date Noted   Gross hematuria 08/10/2021   Cognitive retention disorder    Malnutrition of moderate degree 07/03/2021   Hyperkalemia    Hyponatremia    Neuropathic pain    Pressure injury of skin 06/26/2021   VZV (varicella-zoster virus) infection 06/25/2021   Vasculitis (HCC)    Chronic diastolic congestive heart failure (HCC)    Persistent atrial fibrillation (HCC)    Weakness of both lower extremities    Rash 06/10/2021   Weakness  06/10/2021   Chronic combined systolic (congestive) and diastolic (congestive) heart failure ([place]) 06/10/2021   DNR (do not resuscitate) 06/10/2021   OA (osteoarthritis) of hip 01/10/2020   Primary osteoarthritis of left hip 01/10/2020   Preoperative cardiovascular examination    Shortness of breath    Bilateral pleural effusion    Thrombocytopenia ([place]) 06/03/2018   Atrial flutter ([place]) 06/03/2018   Acute on chronic systolic CHF (congestive heart failure) ([place]) 06/03/2018   Bilateral edema of lower extremity    Essential hypertension 09/19/2015   LBBB (left bundle branch block) 09/19/2015   Hypokalemia 08/27/2014   ASCVD (arteriosclerotic cardiovascular disease)    Dyslipidemia    ED (erectile dysfunction)    Left bundle branch block    Postsurgical percutaneous transluminal coronary angioplasty status    Coronary atherosclerosis of native coronary artery    Bilateral inguinal hernia 06/15/2012    Past Surgical History:  Procedure Laterality Date   [place] TEST  05/25/12   CHOLECYSTECTOMY  1979   CORONARY STENT PLACEMENT  1997   INGUINAL HERNIA REPAIR  06/15/2012   Procedure: HERNIA REPAIR INGUINAL ADULT;  Surgeon: Adin Hector, MD;  Location: [place];  Service: General;  Laterality: Bilateral;  Bilateral Inguinal Hernia Repair with Ultrapro Mesh   MOHS SURGERY     RIGHT/LEFT HEART CATH AND CORONARY ANGIOGRAPHY N/A 11/13/2019   Procedure: RIGHT/LEFT HEART CATH AND CORONARY ANGIOGRAPHY;  Surgeon: Jettie Booze, MD;  Location: [facility] [facility];  Service: Cardiovascular;  Laterality: N/A;   TOTAL HIP ARTHROPLASTY Left 01/10/2020   Procedure: TOTAL HIP ARTHROPLASTY ANTERIOR APPROACH;  Surgeon: Gaynelle Arabian, MD;  Location: [facility];  Service: Orthopedics;  Laterality: Left;  121min       Family History  Problem Relation Age of Onset   Arthritis Father    Hypertension Paternal Uncle    Heart attack Neg Hx    Stroke Neg Hx      Social History   Tobacco Use   Smoking status: Never   Smokeless tobacco: Never  Vaping Use   Vaping Use: Never used  Substance Use Topics   Alcohol use: No   Drug use: No    Home Medications Prior to Admission medications   Medication Sig Start Date End Date Taking? Authorizing Provider  acetaminophen (TYLENOL) 325 MG tablet Take 2 tablets (650 mg total) by mouth every 6 (six) hours as needed for mild pain (or Fever >/= 101). 06/25/21   Elgergawy, Silver Huguenin, MD  atorvastatin (LIPITOR) 40 MG tablet TAKE ONE TABLET BY MOUTH EVERYDAY AT BEDTIME 01/31/21   Jettie Booze, MD  b complex vitamins tablet Take 1 tablet by mouth daily.    [provider]  carvedilol (COREG) 6.25 MG tablet Take 1 tablet (6.25 mg total) by mouth 2 (two) times daily with a meal. 06/25/21   Elgergawy, Silver Huguenin, MD  cloNIDine (CATAPRES - DOSED IN MG/24 HR) 0.1 mg/24hr patch Place 1 patch (0.1 mg total) onto the skin once a week. 07/21/21   Angiulli, Lavon Paganini, PA-C  ELIQUIS 2.5 MG TABS tablet TAKE ONE TABLET BY MOUTH EVERY MORNING and TAKE ONE TABLET BY MOUTH EVERYDAY AT BEDTIME 07/30/21   Jettie Booze, MD  lidocaine (LIDODERM) 5 % Place 1 patch onto the skin daily. Remove & Discard patch within 12 hours or as directed by MD 07/18/21   Angiulli, Lavon Paganini, PA-C  meclizine (ANTIVERT) 12.5 MG tablet Take 1 tablet (12.5 mg total) by mouth 3 (three) times daily as needed for dizziness. 07/17/21   Angiulli, Lavon Paganini, PA-C  Multiple Vitamin (MULTIVITAMIN WITH MINERALS) TABS tablet Take 1 tablet by mouth every other day. [facility]  Spectravite Multivitamin    [provider]  Multiple Vitamins-Minerals (OCUVITE-LUTEIN PO) Take 1 capsule by mouth daily.    [provider]  polyethylene glycol (MIRALAX / GLYCOLAX) 17 g packet Take 17 g by mouth daily. 07/18/21   Angiulli, Lavon Paganini, PA-C  tamsulosin (FLOMAX) 0.4 MG CAPS capsule Take 1 capsule (0.4 mg total) by mouth daily. 06/26/21   Elgergawy, Silver Huguenin, MD    Allergies    Patient has no known allergies.  Review of Systems   Review of Systems  Genitourinary:  Positive for hematuria.  All other systems reviewed and are negative.  Physical Exam Updated Vital Signs BP (!) 126/98   Pulse 74   Temp 98.8 F (37.1 C) (Oral)   Resp 18   SpO2 94%   Physical Exam Vitals and nursing note reviewed.  Constitutional:      Appearance: Normal appearance.  HENT:     Head: Normocephalic and atraumatic.     Right Ear: External ear normal.  Left Ear: External ear normal.     Nose: Nose normal.     Mouth/Throat:     Mouth: Mucous membranes are moist.     Pharynx: Oropharynx is clear.  Eyes:     Extraocular Movements: Extraocular movements intact.     Conjunctiva/sclera: Conjunctivae normal.     Pupils: Pupils are equal, round, and reactive to light.  Cardiovascular:     Rate and Rhythm: Normal rate and regular rhythm.     Pulses: Normal pulses.     Heart sounds: Normal heart sounds.  Pulmonary:     Effort: Pulmonary effort is normal.     Breath sounds: Normal breath sounds.  Abdominal:     General: Abdomen is flat. Bowel sounds are normal.     Palpations: Abdomen is soft.  Genitourinary:    Comments: Foley in place.  No gross blood at meatus. Musculoskeletal:        General: Normal range of motion.     Cervical back: Normal range of motion and neck supple.  Skin:    Capillary Refill: Capillary refill takes less than 2 seconds.  Neurological:     Mental Status: He is alert. Mental status is at baseline.  Psychiatric:        Mood and Affect: Mood normal.        Behavior: Behavior normal.    ED Results / Procedures / Treatments   Labs (all labs ordered are listed, but only abnormal results are displayed) Labs Reviewed  BASIC METABOLIC PANEL - Abnormal; Notable for the following components:      Result Value   Glucose, Bld 116 (*)    All other components within normal limits  CBC WITH DIFFERENTIAL/PLATELET - Abnormal;  Notable for the following components:   RBC 3.52 (*)    Hemoglobin 11.3 (*)    HCT 35.8 (*)    MCV 101.7 (*)    All other components within normal limits  PROTIME-INR - Abnormal; Notable for the following components:   Prothrombin Time 20.2 (*)    INR 1.7 (*)    All other components within normal limits  URINALYSIS, ROUTINE W REFLEX MICROSCOPIC - Abnormal; Notable for the following components:   Color, Urine RED (*)    APPearance TURBID (*)    Glucose, UA   (*)    Value: TEST NOT REPORTED DUE TO COLOR INTERFERENCE OF URINE PIGMENT   Hgb urine dipstick   (*)    Value: TEST NOT REPORTED DUE TO COLOR INTERFERENCE OF URINE PIGMENT   Bilirubin Urine   (*)    Value: TEST NOT REPORTED DUE TO COLOR INTERFERENCE OF URINE PIGMENT   Ketones, ur   (*)    Value: TEST NOT REPORTED DUE TO COLOR INTERFERENCE OF URINE PIGMENT   Protein, ur   (*)    Value: TEST NOT REPORTED DUE TO COLOR INTERFERENCE OF URINE PIGMENT   Nitrite   (*)    Value: TEST NOT REPORTED DUE TO COLOR INTERFERENCE OF URINE PIGMENT   Leukocytes,Ua   (*)    Value: TEST NOT REPORTED DUE TO COLOR INTERFERENCE OF URINE PIGMENT   All other components within normal limits  URINALYSIS, MICROSCOPIC (REFLEX) - Abnormal; Notable for the following components:   Bacteria, UA   (*)    Value: TEST NOT REPORTED DUE TO COLOR INTERFERENCE OF URINE PIGMENT   All other components within normal limits  HEPATIC FUNCTION PANEL - Abnormal; Notable for the following components:   Albumin 2.7 (*)  Total Bilirubin 2.1 (*)    Bilirubin, Direct 0.4 (*)    Indirect Bilirubin 1.7 (*)    All other components within normal limits  URINE CULTURE  RESP PANEL BY RT-PCR (FLU A&B, COVID) [id number]    EKG EKG Interpretation  Date/Time:  Sunday August 10 2021 11:45:14 EDT Ventricular Rate:  82 PR Interval:  197 QRS Duration: 129 QT Interval:  400 QTC Calculation: 468 R Axis:   120 Text Interpretation: Sinus or ectopic atrial rhythm Nonspecific  intraventricular conduction delay Anterolateral infarct, age indeterminate No significant change since last tracing Confirmed by Isla Pence [phone]) on 08/10/2021 11:56:54 AM  Radiology CT Renal Stone Study  Result Date: 08/10/2021 CLINICAL DATA:  Hematuria.  Foley catheter since Friday. EXAM: CT ABDOMEN AND PELVIS WITHOUT CONTRAST TECHNIQUE: Multidetector CT imaging of the abdomen and pelvis was performed following the standard protocol without IV contrast. COMPARISON:  06/14/2021 FINDINGS: Lower chest: Motion degraded images. No acute abnormality. Coronary artery calcifications noted. Hepatobiliary: Cholecystectomy.  Liver is homogeneous. Pancreas: Unremarkable. No pancreatic ductal dilatation or surrounding inflammatory changes. Spleen: Normal in size without focal abnormality. Adrenals/Urinary Tract: The adrenal glands are normal. LEFT kidney: 5.5 centimeters cyst in the UPPER pole region. Intrarenal calculi measure 2-3 millimeters throughout the LEFT kidney. There is no hydronephrosis. Parapelvic cysts are noted. The LEFT ureter is unremarkable. RIGHT kidney: Intrarenal calculi measure 2-4 millimeters throughout the RIGHT kidney. There is no hydronephrosis. Parapelvic renal cysts are present. The RIGHT ureter is unremarkable. Bladder: A Foley catheter decompresses the urinary bladder. There is significant streak artifact from hardware in the pelvis which limits evaluation of the distal ureters and urinary bladder. Stomach/Bowel: Stomach and small bowel loops are normal in appearance. There are numerous colonic diverticula. No associated inflammatory changes. Average stool burden. The appendix is not well seen. Vascular/Lymphatic: There is atherosclerotic calcification of the abdominal aorta. No associated aneurysm. There is normal vascular opacification of the celiac axis, superior mesenteric artery, and inferior mesenteric artery. Normal appearance of the portal venous system and inferior vena cava.  No retroperitoneal or mesenteric adenopathy. Reproductive: The prostate is significantly enlarged. Prostatic calcifications are noted. Other: No ascites.  Abdominal wall is unremarkable. Musculoskeletal: Significant degenerative changes in the RIGHT hip. Status post LEFT hip arthroplasty, with significant streak artifact. Moderate degenerative changes throughout the lumbar spine. Stable appearance of superior endplate fracture of L1. IMPRESSION: 1. Multiple nonobstructing intrarenal calculi bilaterally. No evidence for ureteral abnormality. Foley catheter. Streak artifact limits full evaluation of the distal ureters and urinary bladder. However, no obstruction is suspected. 2. Marked enlargement of prostate, similar in appearance to prior study. 3. Colonic diverticulosis without acute diverticulitis. 4.  Aortic atherosclerosis.  (ICD10-I70.0) 5. Status post LEFT hip arthroplasty. Degenerative changes of the RIGHT hip. 6. Stable L1 compression fracture. Electronically Signed   By: Nolon Nations M.D.   On: 08/10/2021 12:14    Procedures Procedures   Medications Ordered in ED Medications  sodium chloride irrigation 0.9 % 3,000 mL (3,000 mLs Irrigation Not Given 08/10/21 1644)  sodium chloride 0.9 % bolus 1,000 mL (0 mLs Intravenous Stopped 08/10/21 1433)  cefTRIAXone (ROCEPHIN) 1 g in sodium chloride 0.9 % 100 mL IVPB (0 g Intravenous Stopped 08/10/21 1433)  HYDROcodone-acetaminophen (NORCO/VICODIN) 5-325 MG per tablet 1 tablet (1 tablet Oral Given 08/10/21 1358)  sterile water for irrigation for irrigation ( Irrigation Given 08/10/21 1645)    ED Course  I have reviewed the triage vital signs and the nursing notes.  Pertinent labs & imaging  results that were available during my care of the patient were reviewed by me and considered in my medical decision making (see chart for details).    MDM Rules/Calculators/A&P                           Pt's foley was changed and hooked up to a 3 way  catheter and had 3L of TBI.  Pt's urine grossly bloody, but I it is possible he has a uti.  He was given a dose of rocephin in the ED.  Pt's urine keeps clotting.  Tbi gets turned off and foley gets irrigated and clots removed.  Tbi gets started again.  Pt d/w Dr. Tresa Moore who recommends continuous irrigation and stopping Eliquis and admission to medicine.  Pt's daughter said she thinks a lot of his meds can be stopped.  She mainly wants him to be comfortable and to not be in pain.  I told her to tell the admission doctor this.  Pt d/w Dr. Tonie Griffith (triad) for admission.    Final Clinical Impression(s) / ED Diagnoses Final diagnoses:  Gross hematuria  On apixaban therapy    Rx / DC Orders ED Discharge Orders     None        Isla Pence, MD 08/10/21 Alda Berthold    Isla Pence, MD 08/10/21 2033

## 2021-08-10 NOTE — ED Notes (Signed)
Pt's foley was manually flushed & bladder contents removed d/t pt c/o more abd discomfort. A small clot was removed from pt's bladder with a toomey syringe & pt is now sleeping comfortably with no c/o any pain. Foley bag remains lower than the pt's bladder & is dripping continuously with no blockage at this time.

## 2021-08-10 NOTE — TOC Initial Note (Signed)
Transition of Care (TOC) - Initial/Assessment Note    Patient Details  Name: Parker Odonnell. MRN: [id number] Date of Birth: 1932-03-21  Transition of [facility]) CM/SW Contact:    Alfredia Ferguson, LCSW Phone Number: 08/10/2021, 3:37 PM  Clinical Narrative:                 TOC received consult for transportation assistance. Patient arrived from [facility] with unclear residence status due to blood in his urine. CSW spoke with patient's daughter and per family he had been at [facility] since the end of September and his insurance coverage ceased 08/07/2021. Per daughter family has arranged for personal care services/custodial care for patient in his apartment and reports they have secured his DME. CSW notes family is primarily concerned wanting patient to be admitted or placed in obs. Daughter reports aware that this is MD determination. When patient is cleared for DC plan is to return to Apartment via PTAR and family support.   Expected Discharge Plan: [place] Barriers to Discharge: Continued Medical Work up   Patient Goals and CMS Choice   CMS Medicare.gov Compare Post Acute Care list provided to:: Patient Represenative (must comment) Choice offered to / list presented to : Adult Children  Expected Discharge Plan and Services Expected Discharge Plan: [place]                                              Prior Living Arrangements/Services   Lives with:: Facility Resident Patient language and need for interpreter reviewed:: Yes        Need for Family Participation in Patient Care: Yes (Comment) Care giver support system in place?: Yes (comment) Current home services: Homehealth aide, Sitter, Housekeeping Criminal Activity/Legal Involvement Pertinent to Current Situation/Hospitalization: No - Comment as needed  Activities of Daily Living      Permission Sought/Granted                  Emotional Assessment          Alcohol / Substance Use: Not Applicable Psych Involvement: No (comment)  Admission diagnosis:  Hematuria  Patient Active Problem List   Diagnosis Date Noted   Cognitive retention disorder    Malnutrition of moderate degree 07/03/2021   Hyperkalemia    Hyponatremia    Neuropathic pain    Pressure injury of skin 06/26/2021   VZV (varicella-zoster virus) infection 06/25/2021   Vasculitis ([place])    Chronic diastolic congestive heart failure ([place])    Persistent atrial fibrillation (HCC)    Weakness of both lower extremities    Rash 06/10/2021   Weakness 06/10/2021   Chronic combined systolic (congestive) and diastolic (congestive) heart failure ([place]) 06/10/2021   DNR (do not resuscitate) 06/10/2021   OA (osteoarthritis) of hip 01/10/2020   Primary osteoarthritis of left hip 01/10/2020   Preoperative cardiovascular examination    Shortness of breath    Bilateral pleural effusion    Thrombocytopenia ([place]) 06/03/2018   Atrial flutter ([name]) 06/03/2018   Acute on chronic systolic CHF (congestive heart failure) ([place]) 06/03/2018   Bilateral edema of lower extremity    Essential hypertension 09/19/2015   LBBB (left bundle branch block) 09/19/2015   Hypokalemia 08/27/2014   ASCVD (arteriosclerotic cardiovascular disease)    Dyslipidemia    ED (erectile dysfunction)    Left  bundle branch block    Postsurgical percutaneous transluminal coronary angioplasty status    Coronary atherosclerosis of native coronary artery    Bilateral inguinal hernia 06/15/2012   PCP:  Lawerance Cruel, MD Pharmacy:   [id number] [place], [place] [address] Phone: [phone] Fax: [phone]  [facility] - [place], [place] - [address] [place] [address] Phone: [phone] Fax: [phone]     Social Determinants of Health (SDOH) Interventions    Readmission Risk  Interventions No flowsheet data found.

## 2021-08-10 NOTE — ED Notes (Signed)
RN has asked secretary to order pt a three way foley catheter per MD order.

## 2021-08-10 NOTE — ED Notes (Signed)
Pt began to c/o painful abd & this RN assessed his catheter & noticed no drainage in the foley drainage bag & there had been enough sterile water dripping from the bag to have already entered the drainage bag at that point. A toomey syringe was used to irrigate the foley and manually remove the bladder contents & a small & tubular shaped clot was extracted & the pt's urine began to drain by gravity at that point & pt no longer c/o any abd pain.

## 2021-08-10 NOTE — ED Notes (Signed)
Pt's foley was disconnected from the CBI bag & was plugged. Pt has began to slow down in regards to filled up his drainage bag & more clots were noted in his drainage bag that remains to hang below the bladder.

## 2021-08-11 DIAGNOSIS — R310 Gross hematuria: Secondary | ICD-10-CM | POA: Diagnosis not present

## 2021-08-11 LAB — BASIC METABOLIC PANEL
Anion gap: 8 (ref 5–15)
BUN: 18 mg/dL (ref 8–23)
CO2: 22 mmol/L (ref 22–32)
Calcium: 9.2 mg/dL (ref 8.9–10.3)
Chloride: 108 mmol/L (ref 98–111)
Creatinine, Ser: 0.88 mg/dL (ref 0.61–1.24)
GFR, Estimated: >60 mL/min (ref >60)
Glucose, Bld: 82 mg/dL (ref 70–99)
Potassium: 4.3 mmol/L (ref 3.5–5.1)
Sodium: 138 mmol/L (ref 135–145)

## 2021-08-11 LAB — CBC
HCT: 34.3 % — ABNORMAL LOW (ref 39.0–52.0)
Hemoglobin: 10.8 g/dL — ABNORMAL LOW (ref 13.0–17.0)
MCH: 32 pg (ref 26.0–34.0)
MCHC: 31.5 g/dL (ref 30.0–36.0)
MCV: 101.8 fL — ABNORMAL HIGH (ref 80.0–100.0)
Platelets: 170 10*3/uL (ref 150–400)
RBC: 3.37 MIL/uL — ABNORMAL LOW (ref 4.22–5.81)
RDW: 14.3 % (ref 11.5–15.5)
WBC: 6.9 10*3/uL (ref 4.0–10.5)
nRBC: 0 % (ref 0.0–0.2)

## 2021-08-11 LAB — URINE CULTURE

## 2021-08-11 MED ORDER — GABAPENTIN 100 MG PO CAPS
100.0000 mg | ORAL_CAPSULE | Freq: Three times a day (TID) | ORAL | Status: DC
  Administered 2021-08-11 – 2021-08-14 (×9): 100 mg via ORAL
  Filled 2021-08-11 (×9): qty 1

## 2021-08-11 NOTE — ED Notes (Signed)
Daughter [name] [phone] would like an update

## 2021-08-11 NOTE — ED Notes (Signed)
This RN spoke to Pt's daughter on phone. Update given. She is concerned about pt's pain management, but happy to hear his status.

## 2021-08-11 NOTE — ED Notes (Signed)
Safety mittens ordered as pt is confused removed spo2 and attempting to remove IV. States he needs to leave. Pt redirected.

## 2021-08-11 NOTE — ED Notes (Signed)
Breakfast Orders placed 

## 2021-08-11 NOTE — Evaluation (Signed)
Physical Therapy Evaluation Patient Details Name: Parker Odonnell. MRN: [id number] DOB: 06-18-1932 Today's Date: 08/11/2021  History of Present Illness  Pt is an 87 y/o male admitted 10/23 from SNF secondary to hematuria. Pt had foley placed on 10/21. PMH includes HTN, CAD, and L THA.  Clinical Impression  Pt admitted secondary to problem above with deficits below. Pt requiring max A for rolling and yelling out in pain throughout. Pt requesting to defer further mobility. Per notes, pt was from SNF, but likely has to return home at d/c. If pt to return home will require max HH services and constant supervision. Will also require DME below and PTAR transport home. Will continue to follow acutely.        Recommendations for follow up therapy are one component of a multi-disciplinary discharge planning process, led by the attending physician.  Recommendations may be updated based on patient status, additional functional criteria and insurance authorization.  Follow Up Recommendations Home health PT (HHOT, HHaide, HHRN--per notes, having to return home at d/c)    Assistance Recommended at Discharge Frequent or constant Supervision/Assistance  Functional Status Assessment Patient has had a recent decline in their functional status and/or demonstrates limited ability to make significant improvements in function in a reasonable and predictable amount of time  Equipment Recommendations  Wheelchair cushion (measurements PT);Wheelchair (measurements PT);3in1 (PT);Hospital bed;Other (comment) (hospital bed)    Recommendations for Other Services       Precautions / Restrictions Precautions Precautions: Fall Restrictions Weight Bearing Restrictions: No      Mobility  Bed Mobility Overal bed mobility: Needs Assistance Bed Mobility: Rolling Rolling: Max assist         General bed mobility comments: Max A to roll from side to side for linen removal. Pt very focused on pain and yelling  throughout. Asking PT to stop after rolling. Further mobility deferred.    Transfers                        Ambulation/Gait                Stairs            Wheelchair Mobility    Modified Rankin (Stroke Patients Only)       Balance                                             Pertinent Vitals/Pain Pain Assessment: Faces Faces Pain Scale: Hurts whole lot Pain Location: BLE, generalized Pain Descriptors / Indicators: Grimacing;Guarding;Moaning Pain Intervention(s): Limited activity within patient's tolerance;Monitored during session;Repositioned    Home Living Family/patient expects to be discharged to:: Private residence Living Arrangements: Alone Available Help at Discharge: Family;Available PRN/intermittently Type of Home: Apartment Home Access: Level entry;Elevator       Home Layout: One level Home Equipment: Conservation officer, nature (2 wheels) Additional Comments: Unsure of accuracy. Per notes, pt will likely be going home from hospital instead of back to SNF. Will need to confirm with family.    Prior Function Prior Level of Function : Patient poor historian/Family not available             Mobility Comments: Unsure of PLOF. Came from SNF and assume pt requiring assist for mobility tasks. ADLs Comments: Assume pt was requiring assist.     Hand Dominance  Extremity/Trunk Assessment   Upper Extremity Assessment Upper Extremity Assessment: Generalized weakness    Lower Extremity Assessment Lower Extremity Assessment: RLE deficits/detail;LLE deficits/detail;Generalized weakness RLE Deficits / Details: Limited ROM at hip and knee secondary to pain. LLE Deficits / Details: Limited ROM at hip and knee secondary to pain.       Communication   Communication: No difficulties  Cognition Arousal/Alertness: Awake/alert Behavior During Therapy: Restless Overall Cognitive Status: No family/caregiver present to  determine baseline cognitive functioning                                 General Comments: Pt restless and very focused on pain throughout. No family present.        General Comments      Exercises     Assessment/Plan    PT Assessment Patient needs continued PT services  PT Problem List Decreased strength;Decreased range of motion;Decreased activity tolerance;Decreased balance;Decreased mobility;Decreased knowledge of use of DME;Decreased knowledge of precautions;Decreased safety awareness;Decreased cognition;Pain       PT Treatment Interventions DME instruction;Functional mobility training;Gait training;Therapeutic activities;Therapeutic exercise;Balance training;Patient/family education    PT Goals (Current goals can be found in the Care Plan section)  Acute Rehab PT Goals Patient Stated Goal: to go home PT Goal Formulation: With patient Time For Goal Achievement: 08/25/21 Potential to Achieve Goals: Fair    Frequency Min 2X/week   Barriers to discharge Other (comment) Unsure of caregiver support    Co-evaluation               AM-PAC PT "6 Clicks" Mobility  Outcome Measure Help needed turning from your back to your side while in a flat bed without using bedrails?: A Lot Help needed moving from lying on your back to sitting on the side of a flat bed without using bedrails?: A Lot Help needed moving to and from a bed to a chair (including a wheelchair)?: Total Help needed standing up from a chair using your arms (e.g., wheelchair or bedside chair)?: Total Help needed to walk in hospital room?: Total Help needed climbing 3-5 steps with a railing? : Total 6 Click Score: 8    End of Session   Activity Tolerance: Patient limited by pain Patient left: in bed (on stretcher in ED in hallway) Nurse Communication: Mobility status PT Visit Diagnosis: Difficulty in walking, not elsewhere classified (R26.2);Other abnormalities of gait and mobility  (R26.89);Pain Pain - Right/Left:  (bilateral) Pain - part of body: Leg    Time: 1054-1105 PT Time Calculation (min) (ACUTE ONLY): 11 min   Charges:   PT Evaluation $PT Eval Moderate Complexity: 1 Mod          Reuel Derby, PT, DPT  Acute Rehabilitation Services  Pager: [phone] Office: [phone]   Rudean Hitt 08/11/2021, 2:12 PM

## 2021-08-11 NOTE — Progress Notes (Signed)
PROGRESS NOTE    Parker Odonnell.  YIR:[id number] DOB: [birth year] DOA: 08/10/2021 PCP: Parker Cruel, MD   Chief Complaint  Patient presents with   Hematuria  Brief Narrative/Hospital Course: [name], 87 y.o. male with PMH of PAF and Eliquis, postherpetic neuralgia, debility, CAD, HFpEF, HLD, hypertension, DNR who has had Foley since admission in August unclear if it was ever cleaned or changed, presents with gross hematuria.  He was treated for UTI last week and started on Cipro 3 days prior to admission, was on Keflex for few days prior to Cipro being started. In ED-Patient reports he does not remember seeing blood in his urine.  He has not had fevers or chills.  Daughter reports he has had some dizziness and has generalized weakness since his hospitalization.  Dizziness has gotten some better since having Lyrica stopped and he is now on gabapentin for the last week according to the daughter. In the ED hemodynamically stable urology consulted started on CBI.sodium 137 potassium 3.6 chloride 105 bicarb 25 creatinine 0.96 BUN 20 glucose 116 calcium 9.7 alk phos is 87 albumin 2.7 AST 29 ALT 31 bilirubin 0.1 with 1.7 being indirect, WBC 5900 hemoglobin 11.3 hematocrit 35.8 platelet 291,000.  INR 1.7. Renal CT scan yesterday showed BPH but no obstructing stones.  Subjective:  Seen examined this morning.  Urine is pinkish, he is off CBI. Patient is alert, awake oriented to self, current place, unable to tell me current date or who the president is.  Assessment & Plan:  Gross hematuria in the setting of Eliquis and with indwelling Foley catheter.  Urology Parker Odonnell was consuled in the ED, patient was placed on CBI.  At this time hematuria resolving urine pinkish urine.He is off CBI.  Continue ceftriaxone, Eliquis remains on hold.  Urine culture growing multiple species.  Resend culture  Complicated UTI recently on ciprofloxacin 3 days prior to admission.  Follow-up urine culture  culture antibiotics as above  Essential HTN CAD: Blood pressure is stable.  No chest pain.  He is on Coreg Lipitor.  PAF, Eliquis on hold  Anemia appears chronic.  Monitor hemoglobin  Chronic diastolic CHF: Patient denies any shortness of breath.  Appears compensated.  Monitor Net IO Since Admission: No IO data has been entered for this period [08/11/21 1404]  There were no vitals filed for this visit.   Neuropathic pain: Recently Lyrica changed to Neurontin.  Continue same continue pain control  Debility/generalized weakness PT OT evaluation, fall precaution supportive care. Generalized pain continue Norco, Neurontin.  Patient is complaining of "I am hurting" unable to localize.  Goals of care DNR  DVT prophylaxis: SCDs Start: 08/10/21 2346 Code Status:   Code Status: DNR Family Communication: plan of care discussed with patient at bedside.  Status is: Inpatient Remains inpatient appropriate because: For ongoing managed aggressive medullary UTI.  Objective: Vitals last 24 hrs: Vitals:   08/11/21 0400 08/11/21 0501 08/11/21 0830 08/11/21 1237  BP: (!) 143/85 134/87 136/76 110/68  Pulse: 69 82 91 75  Resp: $Remo'17 20 20 16  'Hnpos$ Temp:      TempSrc:      SpO2: 99% 99% 98% 100%   Weight change:  No intake or output data in the 24 hours ending 08/11/21 1356 Net IO Since Admission: No IO data has been entered for this period [08/11/21 1356]   Physical Examination: General exam: AA0X1-2, weak,older than stated age. HEENT:Oral mucosa moist, Ear/Nose WNL grossly,dentition normal. Respiratory system: B/l diminished  BS, no use of accessory muscle, non tender. Cardiovascular system: S1 & S2 +,No JVD. Gastrointestinal system: Abdomen soft, NT,ND, BS+. Nervous System:Alert, awake, moving extremities. Extremities: edema none, distal peripheral pulses palpable.  Skin: No rashes, no icterus. MSK: Normal muscle bulk, tone, power. Foley catheter in place with pinkish urine in the  bag  Medications reviewed:  Scheduled Meds:  atorvastatin  40 mg Oral QHS   carvedilol  6.25 mg Oral BID WC   multivitamin  1 tablet Oral Daily   tamsulosin  0.4 mg Oral Daily   Continuous Infusions:  cefTRIAXone (ROCEPHIN)  IV Stopped (08/11/21 1219)   sodium chloride irrigation     Diet Order             Diet Heart Room service appropriate? Yes; Fluid consistency: Thin  Diet effective now                 Weight change:   Wt Readings from Last 3 Encounters:  07/18/21 65 kg  06/14/21 67 kg  06/01/21 66.2 kg     Consultants:see note  Procedures:see note Antimicrobials: Anti-infectives (From admission, onward)    Start     Dose/Rate Route Frequency Ordered Stop   08/11/21 1000  cefTRIAXone (ROCEPHIN) 1 g in sodium chloride 0.9 % 100 mL IVPB        1 g 200 mL/hr over 30 Minutes Intravenous Every 24 hours 08/10/21 2345     08/10/21 1230  cefTRIAXone (ROCEPHIN) 1 g in sodium chloride 0.9 % 100 mL IVPB        1 g 200 mL/hr over 30 Minutes Intravenous  Once 08/10/21 1229 08/10/21 1433      Culture/Microbiology    Component Value Date/Time   SDES URINE, CATHETERIZED 08/10/2021 1109   SPECREQUEST  08/10/2021 1109    NONE Performed at [facility], [address] [place], [place] [address]    CULT MULTIPLE SPECIES PRESENT, SUGGEST RECOLLECTION (A) 08/10/2021 1109   REPTSTATUS 08/11/2021 FINAL 08/10/2021 1109    Other culture-see note  Unresulted Labs (From admission, onward)     Start     Ordered   08/12/21 5520  Basic metabolic panel  Daily,   R     Question:  Specimen collection method  Answer:  Lab=Lab collect   08/11/21 0808   08/12/21 0500  CBC  Daily,   R     Question:  Specimen collection method  Answer:  Lab=Lab collect   08/11/21 0808          Data Reviewed: I have personally reviewed following labs and imaging studies CBC: Recent Labs  Lab 08/10/21 1109 08/11/21 0445  WBC 5.9 6.9  NEUTROABS 4.6  --   HGB 11.3* 10.8*  HCT 35.8*  34.3*  MCV 101.7* 101.8*  PLT 191 802   Basic Metabolic Panel: Recent Labs  Lab 08/10/21 1109 08/11/21 0445  NA 137 138  K 3.6 4.3  CL 105 108  CO2 25 22  GLUCOSE 116* 82  BUN 20 18  CREATININE 0.96 0.88  CALCIUM 9.7 9.2   GFR: CrCl cannot be calculated (Unknown ideal weight.). Liver Function Tests: Recent Labs  Lab 08/10/21 1109  AST 29  ALT 31  ALKPHOS 87  BILITOT 2.1*  PROT 6.5  ALBUMIN 2.7*   No results for input(s): LIPASE, AMYLASE in the last 168 hours. No results for input(s): AMMONIA in the last 168 hours. Coagulation Profile: Recent Labs  Lab 08/10/21 1109  INR 1.7*  Cardiac Enzymes: No results for input(s): CKTOTAL, CKMB, CKMBINDEX, TROPONINI in the last 168 hours. BNP (last 3 results) No results for input(s): PROBNP in the last 8760 hours. HbA1C: No results for input(s): HGBA1C in the last 72 hours. CBG: No results for input(s): GLUCAP in the last 168 hours. Lipid Profile: No results for input(s): CHOL, HDL, LDLCALC, TRIG, CHOLHDL, LDLDIRECT in the last 72 hours. Thyroid Function Tests: No results for input(s): TSH, T4TOTAL, FREET4, T3FREE, THYROIDAB in the last 72 hours. Anemia Panel: No results for input(s): VITAMINB12, FOLATE, FERRITIN, TIBC, IRON, RETICCTPCT in the last 72 hours. Sepsis Labs: No results for input(s): PROCALCITON, LATICACIDVEN in the last 168 hours.  Recent Results (from the past 240 hour(s))  Urine Culture     Status: Abnormal   Collection Time: 08/10/21 11:09 AM   Specimen: Urine, Catheterized  Result Value Ref Range Status   Specimen Description URINE, CATHETERIZED  Final   Special Requests   Final    NONE Performed at [facility], [address] [place], [place] [address]    Culture MULTIPLE SPECIES PRESENT, SUGGEST RECOLLECTION (A)  Final   Report Status 08/11/2021 FINAL  Final  Resp Panel by RT-PCR (Flu A&B, Covid) Nasopharyngeal Swab     Status: None   Collection Time: 08/10/21  7:09 PM   Specimen:  Nasopharyngeal Swab; Nasopharyngeal(NP) swabs in vial transport medium  Result Value Ref Range Status   SARS Coronavirus 2 by RT PCR NEGATIVE NEGATIVE Final    Comment: (NOTE) SARS-CoV-2 target nucleic acids are NOT DETECTED.  The SARS-CoV-2 RNA is generally detectable in upper respiratory specimens during the acute phase of infection. The lowest concentration of SARS-CoV-2 viral copies this assay can detect is 138 copies/mL. A negative result does not preclude SARS-Cov-2 infection and should not be used as the sole basis for treatment or other patient management decisions. A negative result may occur with  improper specimen collection/handling, submission of specimen other than nasopharyngeal swab, presence of viral mutation(s) within the areas targeted by this assay, and inadequate number of viral copies(<138 copies/mL). A negative result must be combined with clinical observations, patient history, and epidemiological information. The expected result is Negative.  Fact Sheet for Patients:  EntrepreneurPulse.com.au  Fact Sheet for Healthcare Providers:  IncredibleEmployment.be  This test is no t yet approved or cleared by the [place] FDA and  has been authorized for detection and/or diagnosis of SARS-CoV-2 by FDA under an Emergency Use Authorization (EUA). This EUA will remain  in effect (meaning this test can be used) for the duration of the COVID-19 declaration under Section 564(b)(1) of the Act, 21 U.S.C.section 360bbb-3(b)(1), unless the authorization is terminated  or revoked sooner.       Influenza A by PCR NEGATIVE NEGATIVE Final   Influenza B by PCR NEGATIVE NEGATIVE Final    Comment: (NOTE) The Xpert Xpress SARS-CoV-2/FLU/RSV plus assay is intended as an aid in the diagnosis of influenza from Nasopharyngeal swab specimens and should not be used as a sole basis for treatment. Nasal washings and aspirates are unacceptable for  Xpert Xpress SARS-CoV-2/FLU/RSV testing.  Fact Sheet for Patients: EntrepreneurPulse.com.au  Fact Sheet for Healthcare Providers: IncredibleEmployment.be  This test is not yet approved or cleared by the [place] FDA and has been authorized for detection and/or diagnosis of SARS-CoV-2 by FDA under an Emergency Use Authorization (EUA). This EUA will remain in effect (meaning this test can be used) for the duration of the COVID-19 declaration under Section 564(b)(1)  of the Act, 21 U.S.C. section 360bbb-3(b)(1), unless the authorization is terminated or revoked.  Performed at [facility], [place] [address] [place], [place] [address]      Radiology Studies: CT Renal Stone Study  Result Date: 08/10/2021 CLINICAL DATA:  Hematuria.  Foley catheter since Friday. EXAM: CT ABDOMEN AND PELVIS WITHOUT CONTRAST TECHNIQUE: Multidetector CT imaging of the abdomen and pelvis was performed following the standard protocol without IV contrast. COMPARISON:  06/14/2021 FINDINGS: Lower chest: Motion degraded images. No acute abnormality. Coronary artery calcifications noted. Hepatobiliary: Cholecystectomy.  Liver is homogeneous. Pancreas: Unremarkable. No pancreatic ductal dilatation or surrounding inflammatory changes. Spleen: Normal in size without focal abnormality. Adrenals/Urinary Tract: The adrenal glands are normal. LEFT kidney: 5.5 centimeters cyst in the UPPER pole region. Intrarenal calculi measure 2-3 millimeters throughout the LEFT kidney. There is no hydronephrosis. Parapelvic cysts are noted. The LEFT ureter is unremarkable. RIGHT kidney: Intrarenal calculi measure 2-4 millimeters throughout the RIGHT kidney. There is no hydronephrosis. Parapelvic renal cysts are present. The RIGHT ureter is unremarkable. Bladder: A Foley catheter decompresses the urinary bladder. There is significant streak artifact from hardware in the pelvis which limits evaluation of  the distal ureters and urinary bladder. Stomach/Bowel: Stomach and small bowel loops are normal in appearance. There are numerous colonic diverticula. No associated inflammatory changes. Average stool burden. The appendix is not well seen. Vascular/Lymphatic: There is atherosclerotic calcification of the abdominal aorta. No associated aneurysm. There is normal vascular opacification of the celiac axis, superior mesenteric artery, and inferior mesenteric artery. Normal appearance of the portal venous system and inferior vena cava. No retroperitoneal or mesenteric adenopathy. Reproductive: The prostate is significantly enlarged. Prostatic calcifications are noted. Other: No ascites.  Abdominal wall is unremarkable. Musculoskeletal: Significant degenerative changes in the RIGHT hip. Status post LEFT hip arthroplasty, with significant streak artifact. Moderate degenerative changes throughout the lumbar spine. Stable appearance of superior endplate fracture of L1. IMPRESSION: 1. Multiple nonobstructing intrarenal calculi bilaterally. No evidence for ureteral abnormality. Foley catheter. Streak artifact limits full evaluation of the distal ureters and urinary bladder. However, no obstruction is suspected. 2. Marked enlargement of prostate, similar in appearance to prior study. 3. Colonic diverticulosis without acute diverticulitis. 4.  Aortic atherosclerosis.  (ICD10-I70.0) 5. Status post LEFT hip arthroplasty. Degenerative changes of the RIGHT hip. 6. Stable L1 compression fracture. Electronically Signed   By: Nolon Nations M.D.   On: 08/10/2021 12:14     LOS: 1 day   Antonieta Pert, MD [facility]  08/11/2021, 1:56 PM

## 2021-08-11 NOTE — ED Notes (Signed)
Attempting to feed patient breakfast. Pt not agreeable to eating or drinking much.

## 2021-08-11 NOTE — ED Notes (Addendum)
Pt moaning and stating "i'm hurting." Unable to provide as to where pt is hurt. PRN norco provide. While administering pt had difficulty swallowing pill.

## 2021-08-11 NOTE — ED Notes (Signed)
Pt's pastor at bedside.  

## 2021-08-12 DIAGNOSIS — R310 Gross hematuria: Secondary | ICD-10-CM | POA: Diagnosis not present

## 2021-08-12 LAB — BASIC METABOLIC PANEL
Anion gap: 9 (ref 5–15)
BUN: 17 mg/dL (ref 8–23)
CO2: 21 mmol/L — ABNORMAL LOW (ref 22–32)
Calcium: 9.3 mg/dL (ref 8.9–10.3)
Chloride: 109 mmol/L (ref 98–111)
Creatinine, Ser: 0.91 mg/dL (ref 0.61–1.24)
GFR, Estimated: >60 mL/min (ref >60)
Glucose, Bld: 99 mg/dL (ref 70–99)
Potassium: 3.9 mmol/L (ref 3.5–5.1)
Sodium: 139 mmol/L (ref 135–145)

## 2021-08-12 LAB — CBC
HCT: 32.3 % — ABNORMAL LOW (ref 39.0–52.0)
Hemoglobin: 10.3 g/dL — ABNORMAL LOW (ref 13.0–17.0)
MCH: 32 pg (ref 26.0–34.0)
MCHC: 31.9 g/dL (ref 30.0–36.0)
MCV: 100.3 fL — ABNORMAL HIGH (ref 80.0–100.0)
Platelets: 170 10*3/uL (ref 150–400)
RBC: 3.22 MIL/uL — ABNORMAL LOW (ref 4.22–5.81)
RDW: 14.1 % (ref 11.5–15.5)
WBC: 5.3 10*3/uL (ref 4.0–10.5)
nRBC: 0 % (ref 0.0–0.2)

## 2021-08-12 LAB — URINE CULTURE: Culture: NO GROWTH

## 2021-08-12 MED ORDER — FINASTERIDE 5 MG PO TABS
5.0000 mg | ORAL_TABLET | Freq: Every day | ORAL | Status: DC
  Administered 2021-08-12 – 2021-08-14 (×3): 5 mg via ORAL
  Filled 2021-08-12 (×3): qty 1

## 2021-08-12 MED ORDER — ADULT MULTIVITAMIN W/MINERALS CH
1.0000 | ORAL_TABLET | ORAL | Status: DC
  Administered 2021-08-12 – 2021-08-14 (×2): 1 via ORAL
  Filled 2021-08-12 (×2): qty 1

## 2021-08-12 MED ORDER — TRAMADOL HCL 50 MG PO TABS
50.0000 mg | ORAL_TABLET | Freq: Four times a day (QID) | ORAL | Status: DC | PRN
Start: 2021-08-12 — End: 2021-08-12

## 2021-08-12 MED ORDER — B COMPLEX-C PO TABS
1.0000 | ORAL_TABLET | Freq: Every day | ORAL | Status: DC
  Administered 2021-08-12 – 2021-08-14 (×3): 1 via ORAL
  Filled 2021-08-12 (×4): qty 1

## 2021-08-12 MED ORDER — BISACODYL 10 MG RE SUPP: 10.0000 mg | Freq: Every day | RECTAL | Status: DC | PRN

## 2021-08-12 MED ORDER — B COMPLEX PO TABS: 1.0000 | ORAL_TABLET | Freq: Every day | ORAL | Status: DC

## 2021-08-12 MED ORDER — CHLORHEXIDINE GLUCONATE CLOTH 2 % EX PADS
6.0000 | MEDICATED_PAD | Freq: Every day | CUTANEOUS | Status: DC
  Administered 2021-08-12 – 2021-08-14 (×3): 6 via TOPICAL

## 2021-08-12 NOTE — Progress Notes (Signed)
PROGRESS NOTE    Fincastle Sink.  WUX:[id number] DOB: 23-Oct-1931 DOA: 08/10/2021 PCP: Lawerance Cruel, MD   Chief Complaint  Patient presents with   Hematuria  Brief Narrative/Hospital Course: [place]oston Sink., 87 y.o. male with PMH of PAF and Eliquis, postherpetic neuralgia, debility, CAD, HFpEF, HLD, hypertension, DNR who has had Foley since admission in August unclear if it was ever cleaned or changed, presents with gross hematuria.  He was treated for UTI last week and started on Cipro 3 days prior to admission, was on Keflex for few days prior to Cipro being started. In ED-Patient reports he does not remember seeing blood in his urine.  He has not had fevers or chills.  Daughter reports he has had some dizziness and has generalized weakness since his hospitalization.  Dizziness has gotten some better since having Lyrica stopped and he is now on gabapentin for the last week according to the daughter. In the ED hemodynamically stable urology consulted started on CBI.sodium 137 potassium 3.6 chloride 105 bicarb 25 creatinine 0.96 BUN 20 glucose 116 calcium 9.7 alk phos is 87 albumin 2.7 AST 29 ALT 31 bilirubin 0.1 with 1.7 being indirect, WBC 5900 hemoglobin 11.3 hematocrit 35.8 platelet 291,000.  INR 1.7. Renal CT scan  showed BPH but no obstructing stones.  Subjective: Seen this morning alert awake, pleasantly confused. Urine is clear in the Foley catheter. Awaiting urology evaluation-spoke with Dr. Tresa Moore who will be checking on him this evening  Assessment & Plan:  Gross hematuria in the setting of Eliquis and with indwelling Foley catheter.  Urology Dr Tresa Moore was consulted in the ED, patient was placed on CBI.  At this time hematuria resolved -not in CVA anymore.  Eliquis remains on hold, urine culture is resent, previously growing multiple species.  Dr. Tresa Moore will see the patient today and let us know about the Eliquis if can be resumed.  Complicated UTI recently on  ciprofloxacin 3 days prior to admission.  Urine culture growing multiple species, recent,on Rocephin empirically for now  Essential HTN CAD: BP is controlled.  He has had no chest pain.  Continue his home Coreg and Lipitor.  PAF: Cont coreg, eliquis on hold  Anemia appears chronic.  Monitor hemoglobin, remains stable at this time. Recent Labs  Lab 08/10/21 1109 08/11/21 0445 08/12/21 0139  HGB 11.3* 10.8* 10.3*  HCT 35.8* 34.3* 32.3*    Chronic diastolic CHF: Appears euvolemic, no shortness of breath.  Monitor daily weight/fluid status.  Net IO Since Admission: -135 mL [08/12/21 1308]  Filed Weights   08/12/21 0433  Weight: 58.6 kg    Neuropathic pain: Continue Neurontin.Continue pain control  Debility/generalized weakness PT OT evaluation, fall precaution supportive care. Generalized pain continue tramadol, Neurontin  Goals of care DNR Deconditioning PT OT evaluation  DVT prophylaxis: SCDs Start: 08/10/21 2346 no chemical prophylaxis due to hematuria. Code Status:   Code Status: DNR Family Communication: plan of care discussed with patient at bedside.  Status is: Inpatient Remains inpatient appropriate because: For ongoing managed of his hematuria further plan from urology.   Patient is from [facility] facility was supposed to discharge last Saturday. Obtain PT OT evaluation. It appears daughter has arranged for caregivers at home and requesting [facility] palliative care as outpatient.  Objective: Vitals last 24 hrs: Vitals:   08/12/21 0418 08/12/21 0433 08/12/21 0746 08/12/21 1144  BP: (!) 162/88  (!) 161/104 (!) 145/73  Pulse: (!) 110  99 80  Resp:  _0 Temp: 97.9 F (36.6 C)  98.2 F (36.8 C) 98 F (36.7 C)  TempSrc: Oral   Oral  SpO2: 94%  100% 99%  Weight:  58.6 kg     Weight change:   Intake/Output Summary (Last 24 hours) at 08/12/2021 1308 Last data filed at 08/12/2021 1100 Gross per 24 hour  Intake 590 ml  Output 725 ml  Net  -135 ml   Net IO Since Admission: -135 mL [08/12/21 1308]   Physical Examination: General exam: AAOx to self, frail, older than stated age, weak appearing. HEENT:Oral mucosa moist, Ear/Nose WNL grossly, dentition normal. Respiratory system: bilaterally clear, no use of accessory muscle Cardiovascular system: S1 & S2 +, No JVD,. Gastrointestinal system: Abdomen soft,NT,ND, BS+ Nervous System:Alert, awake, moving extremities and grossly nonfocal Extremities: no edema, distal peripheral pulses palpable.  Skin: No rashes,no icterus. MSK: Normal muscle bulk,tone, power  Foley catheter in place with clear urine.  Medications reviewed:  Scheduled Meds:  atorvastatin  40 mg Oral QHS   b complex vitamins  1 tablet Oral Daily   carvedilol  6.25 mg Oral BID WC   Chlorhexidine Gluconate Cloth  6 each Topical Daily   gabapentin  100 mg Oral TID   multivitamin with minerals  1 tablet Oral QODAY   tamsulosin  0.4 mg Oral Daily   Continuous Infusions:  cefTRIAXone (ROCEPHIN)  IV 1 g (08/12/21 0939)   sodium chloride irrigation     Diet Order             Diet Heart Room service appropriate? Yes; Fluid consistency: Thin  Diet effective now                 Weight change:   Wt Readings from Last 3 Encounters:  08/12/21 58.6 kg  07/18/21 65 kg  06/14/21 67 kg     Consultants:see note  Procedures:see note Antimicrobials: Anti-infectives (From admission, onward)    Start     Dose/Rate Route Frequency Ordered Stop   08/11/21 1000  cefTRIAXone (ROCEPHIN) 1 g in sodium chloride 0.9 % 100 mL IVPB        1 g 200 mL/hr over 30 Minutes Intravenous Every 24 hours 08/10/21 2345     08/10/21 1230  cefTRIAXone (ROCEPHIN) 1 g in sodium chloride 0.9 % 100 mL IVPB        1 g 200 mL/hr over 30 Minutes Intravenous  Once 08/10/21 1229 08/10/21 1433      Culture/Microbiology    Component Value Date/Time   SDES URINE, CATHETERIZED 08/11/2021 1410   SPECREQUEST NONE 08/11/2021 1410   CULT   08/11/2021 1410    NO GROWTH Performed at [facility] [address], [place], [place] [address]    REPTSTATUS 08/12/2021 FINAL 08/11/2021 1410    Other culture-see note  Unresulted Labs (From admission, onward)     Start     Ordered   08/12/21 5638  Basic metabolic panel  Daily,   R     Question:  Specimen collection method  Answer:  Lab=Lab collect   08/11/21 0808   08/12/21 0500  CBC  Daily,   R     Question:  Specimen collection method  Answer:  Lab=Lab collect   08/11/21 0808          Data Reviewed: I have personally reviewed following labs and imaging studies CBC: Recent Labs  Lab 08/10/21 1109 08/11/21 0445 08/12/21 0139  WBC 5.9 6.9 5.3  NEUTROABS 4.6  --   --   HGB 11.3* 10.8* 10.3*  HCT 35.8* 34.3* 32.3*  MCV 101.7* 101.8* 100.3*  PLT 191 170 734   Basic Metabolic Panel: Recent Labs  Lab 08/10/21 1109 08/11/21 0445 08/12/21 0139  NA 137 138 139  K 3.6 4.3 3.9  CL 105 108 109  CO2 25 22 21*  GLUCOSE 116* 82 99  BUN _0 CREATININE 0.96 0.88 0.91  CALCIUM 9.7 9.2 9.3   GFR: Estimated Creatinine Clearance: 48.3 mL/min (by C-G formula based on SCr of 0.91 mg/dL). Liver Function Tests: Recent Labs  Lab 08/10/21 1109  AST 29  ALT 31  ALKPHOS 87  BILITOT 2.1*  PROT 6.5  ALBUMIN 2.7*   No results for input(s): LIPASE, AMYLASE in the last 168 hours. No results for input(s): AMMONIA in the last 168 hours. Coagulation Profile: Recent Labs  Lab 08/10/21 1109  INR 1.7*   Cardiac Enzymes: No results for input(s): CKTOTAL, CKMB, CKMBINDEX, TROPONINI in the last 168 hours. BNP (last 3 results) No results for input(s): PROBNP in the last 8760 hours. HbA1C: No results for input(s): HGBA1C in the last 72 hours. CBG: No results for input(s): GLUCAP in the last 168 hours. Lipid Profile: No results for input(s): CHOL, HDL, LDLCALC, TRIG, CHOLHDL, LDLDIRECT in the last 72 hours. Thyroid Function Tests: No results for input(s): TSH,  T4TOTAL, FREET4, T3FREE, THYROIDAB in the last 72 hours. Anemia Panel: No results for input(s): VITAMINB12, FOLATE, FERRITIN, TIBC, IRON, RETICCTPCT in the last 72 hours. Sepsis Labs: No results for input(s): PROCALCITON, LATICACIDVEN in the last 168 hours.  Recent Results (from the past 240 hour(s))  Urine Culture     Status: Abnormal   Collection Time: 08/10/21 11:09 AM   Specimen: Urine, Catheterized  Result Value Ref Range Status   Specimen Description URINE, CATHETERIZED  Final   Special Requests   Final    NONE Performed at [facility], [address], [place], [place] [address]    Culture MULTIPLE SPECIES PRESENT, SUGGEST RECOLLECTION (A)  Final   Report Status 08/11/2021 FINAL  Final  Resp Panel by RT-PCR (Flu A&B, Covid) Nasopharyngeal Swab     Status: None   Collection Time: 08/10/21  7:09 PM   Specimen: Nasopharyngeal Swab; Nasopharyngeal(NP) swabs in vial transport medium  Result Value Ref Range Status   SARS Coronavirus 2 by RT PCR NEGATIVE NEGATIVE Final    Comment: (NOTE) SARS-CoV-2 target nucleic acids are NOT DETECTED.  The SARS-CoV-2 RNA is generally detectable in upper respiratory specimens during the acute phase of infection. The lowest concentration of SARS-CoV-2 viral copies this assay can detect is 138 copies/mL. A negative result does not preclude SARS-Cov-2 infection and should not be used as the sole basis for treatment or other patient management decisions. A negative result may occur with  improper specimen collection/handling, submission of specimen other than nasopharyngeal swab, presence of viral mutation(s) within the areas targeted by this assay, and inadequate number of viral copies(<138 copies/mL). A negative result must be combined with clinical observations, patient history, and epidemiological information. The expected result is Negative.  Fact Sheet for Patients:  EntrepreneurPulse.com.au  Fact Sheet for  Healthcare Providers:  IncredibleEmployment.be  This test is no t yet approved or cleared by the [place] FDA and  has been authorized for detection and/or diagnosis of SARS-CoV-2 by FDA under an Emergency Use Authorization (EUA). This EUA will remain  in effect (meaning this test can  be used) for the duration of the COVID-19 declaration under Section 564(b)(1) of the Act, 21 U.S.C.section 360bbb-3(b)(1), unless the authorization is terminated  or revoked sooner.       Influenza A by PCR NEGATIVE NEGATIVE Final   Influenza B by PCR NEGATIVE NEGATIVE Final    Comment: (NOTE) The Xpert Xpress SARS-CoV-2/FLU/RSV plus assay is intended as an aid in the diagnosis of influenza from Nasopharyngeal swab specimens and should not be used as a sole basis for treatment. Nasal washings and aspirates are unacceptable for Xpert Xpress SARS-CoV-2/FLU/RSV testing.  Fact Sheet for Patients: EntrepreneurPulse.com.au  Fact Sheet for Healthcare Providers: IncredibleEmployment.be  This test is not yet approved or cleared by the [place] FDA and has been authorized for detection and/or diagnosis of SARS-CoV-2 by FDA under an Emergency Use Authorization (EUA). This EUA will remain in effect (meaning this test can be used) for the duration of the COVID-19 declaration under Section 564(b)(1) of the Act, 21 U.S.C. section 360bbb-3(b)(1), unless the authorization is terminated or revoked.  Performed at [facility], [place] [address] [place], [place] [address]   Urine Culture     Status: None   Collection Time: 08/11/21  2:10 PM   Specimen: Urine, Catheterized  Result Value Ref Range Status   Specimen Description URINE, CATHETERIZED  Final   Special Requests NONE  Final   Culture   Final    NO GROWTH Performed at [facility], [address] [place], [place] [address]    Report Status 08/12/2021 FINAL  Final      Radiology Studies: No results found.   LOS: 2 days   Antonieta Pert, MD [facility]  08/12/2021, 1:08 PM

## 2021-08-12 NOTE — Progress Notes (Signed)
Manufacturing engineer [facility])  Hospital Liaison: RN note         This patient has been referred to our palliative care services in the community at [facility]  [facility] will continue to follow for any discharge planning needs and to coordinate continuation of palliative care in the outpatient setting.    If you have questions or need assistance, please call [phone] or contact the hospital Liaison listed on AMION.      Thank you for this referral.         [name] Gordon, RN, [facility] Liaison   [phone]

## 2021-08-12 NOTE — Progress Notes (Addendum)
Pt arrived to 6n12 from ED. C/o generalized pain, hollering  and moaning denies nausea. Will continue to monitor.

## 2021-08-12 NOTE — Consult Note (Signed)
Reason for Consult:Urinary Retention / Massive Prostate, Gross Hematuria, Bacteruria  Referring Physician: Antonieta Pert MD  [name]. is an 87 y.o. male.   HPI:   1 - Urinary Retention / Massive Prostate - pt in retention since very long hospitilization 05/2021. 220gm prostate (massive) with distended bladder and bilateral hydro by CT 05/2021.   2 - Gross Hematuria - new gross hematuria noted at [facility] 07/2021. He has chornic foley, massive prostate, and on blood thinners for AFIB indication.   3 - Bacteruria - bacteruria as expected with chronic foley noted by adimission labs 07/2021. No fevers, leukocytosis.  PMH sig for AFib/blood thinners (now held), Dementia, Appy. His PCP is C. Melinda Crutch MD.   Today "Parker Odonnell" is seen in consultation for above. He is DNR and in SNF at baseline. Family's goal is to get him back home with home health at discharge.   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   Cancer (HCC)    CHF (congestive heart failure) ([place])    Coronary atherosclerosis of native coronary artery    DNR (do not resuscitate) 06/10/2021   Dyslipidemia    ED (erectile dysfunction)    GERD (gastroesophageal reflux disease)    hx of   History of kidney stones    Hypertension    Inguinal hernia    Left bundle branch block    chronic   Postsurgical percutaneous transluminal coronary angioplasty status    Shingles     Past Surgical History:  Procedure Laterality Date   [facility] TEST  05/25/12   [place]   INGUINAL HERNIA REPAIR  06/15/2012   Procedure: HERNIA REPAIR INGUINAL ADULT;  Surgeon: Adin Hector, MD;  Location: [facility];  Service: General;  Laterality: Bilateral;  Bilateral Inguinal Hernia Repair with Ultrapro Mesh   MOHS SURGERY     RIGHT/LEFT HEART CATH AND CORONARY ANGIOGRAPHY N/A 11/13/2019    Procedure: RIGHT/LEFT HEART CATH AND CORONARY ANGIOGRAPHY;  Surgeon: Jettie Booze, MD;  Location: [facility];  Service: Cardiovascular;  Laterality: N/A;   TOTAL HIP ARTHROPLASTY Left 01/10/2020   Procedure: TOTAL HIP ARTHROPLASTY ANTERIOR APPROACH;  Surgeon: Gaynelle Arabian, MD;  Location: [facility];  Service: Orthopedics;  Laterality: Left;  163min    Family History  Problem Relation Age of Onset   Arthritis Father    Hypertension Paternal Uncle    Heart attack Neg Hx    Stroke Neg Hx     Social History:  reports that he has never smoked. He has never used smokeless tobacco. He reports that he does not drink alcohol and does not use drugs.  Allergies: No Known Allergies  Medications: I have reviewed the patient's current medications.  Results for orders placed or performed during the hospital encounter of 08/10/21 (from the past 48 hour(s))  Resp Panel by RT-PCR (Flu A&B, Covid) Nasopharyngeal Swab     Status: None   Collection Time: 08/10/21  7:09 PM   Specimen: Nasopharyngeal Swab; Nasopharyngeal(NP) swabs in vial transport medium  Result Value Ref Range   SARS Coronavirus 2 by RT PCR NEGATIVE NEGATIVE    Comment: (NOTE) SARS-CoV-2 target nucleic acids are NOT DETECTED.  The SARS-CoV-2 RNA is generally detectable in upper respiratory specimens during the acute phase of infection. The lowest concentration of SARS-CoV-2 viral copies this  assay can detect is 138 copies/mL. A negative result does not preclude SARS-Cov-2 infection and should not be used as the sole basis for treatment or other patient management decisions. A negative result may occur with  improper specimen collection/handling, submission of specimen other than nasopharyngeal swab, presence of viral mutation(s) within the areas targeted by this assay, and inadequate number of viral copies(<138 copies/mL). A negative result must be combined with clinical observations, patient history, and  epidemiological information. The expected result is Negative.  Fact Sheet for Patients:  EntrepreneurPulse.com.au  Fact Sheet for Healthcare Providers:  IncredibleEmployment.be  This test is no t yet approved or cleared by the [place] FDA and  has been authorized for detection and/or diagnosis of SARS-CoV-2 by FDA under an Emergency Use Authorization (EUA). This EUA will remain  in effect (meaning this test can be used) for the duration of the COVID-19 declaration under Section 564(b)(1) of the Act, 21 U.S.C.section 360bbb-3(b)(1), unless the authorization is terminated  or revoked sooner.       Influenza A by PCR NEGATIVE NEGATIVE   Influenza B by PCR NEGATIVE NEGATIVE    Comment: (NOTE) The Xpert Xpress SARS-CoV-2/FLU/RSV plus assay is intended as an aid in the diagnosis of influenza from Nasopharyngeal swab specimens and should not be used as a sole basis for treatment. Nasal washings and aspirates are unacceptable for Xpert Xpress SARS-CoV-2/FLU/RSV testing.  Fact Sheet for Patients: EntrepreneurPulse.com.au  Fact Sheet for Healthcare Providers: IncredibleEmployment.be  This test is not yet approved or cleared by the [place] FDA and has been authorized for detection and/or diagnosis of SARS-CoV-2 by FDA under an Emergency Use Authorization (EUA). This EUA will remain in effect (meaning this test can be used) for the duration of the COVID-19 declaration under Section 564(b)(1) of the Act, 21 U.S.C. section 360bbb-3(b)(1), unless the authorization is terminated or revoked.  Performed at [facility], [place] [address] [place], [place] [address]   Basic metabolic panel     Status: None   Collection Time: 08/11/21  4:45 AM  Result Value Ref Range   Sodium 138 135 - 145 mmol/L   Potassium 4.3 3.5 - 5.1 mmol/L   Chloride 108 98 - 111 mmol/L   CO2 22 22 - 32 mmol/L   Glucose, Bld 82  70 - 99 mg/dL    Comment: Glucose reference range applies only to samples taken after fasting for at least 8 hours.   BUN 18 8 - 23 mg/dL   Creatinine, Ser 0.88 0.61 - 1.24 mg/dL   Calcium 9.2 8.9 - 10.3 mg/dL   GFR, Estimated >60 >60 mL/min    Comment: (NOTE) Calculated using the CKD-EPI Creatinine Equation (2021)    Anion gap 8 5 - 15    Comment: Performed at [facility] [address] [place], [place] [address]  CBC     Status: Abnormal   Collection Time: 08/11/21  4:45 AM  Result Value Ref Range   WBC 6.9 4.0 - 10.5 K/uL   RBC 3.37 (L) 4.22 - 5.81 MIL/uL   Hemoglobin 10.8 (L) 13.0 - 17.0 g/dL   HCT 34.3 (L) 39.0 - 52.0 %   MCV 101.8 (H) 80.0 - 100.0 fL   MCH 32.0 26.0 - 34.0 pg   MCHC 31.5 30.0 - 36.0 g/dL   RDW 14.3 11.5 - 15.5 %   Platelets 170 150 - 400 K/uL   nRBC 0.0 0.0 - 0.2 %    Comment: Performed at  [facility], [place] [address] [place], [place] [address]  Urine Culture     Status: None   Collection Time: 08/11/21  2:10 PM   Specimen: Urine, Catheterized  Result Value Ref Range   Specimen Description URINE, CATHETERIZED    Special Requests NONE    Culture      NO GROWTH Performed at [facility], [place] [address], [place], [place] [address]    Report Status 08/12/2021 FINAL   Basic metabolic panel     Status: Abnormal   Collection Time: 08/12/21  1:39 AM  Result Value Ref Range   Sodium 139 135 - 145 mmol/L   Potassium 3.9 3.5 - 5.1 mmol/L   Chloride 109 98 - 111 mmol/L   CO2 21 (L) 22 - 32 mmol/L   Glucose, Bld 99 70 - 99 mg/dL    Comment: Glucose reference range applies only to samples taken after fasting for at least 8 hours.   BUN 17 8 - 23 mg/dL   Creatinine, Ser 0.91 0.61 - 1.24 mg/dL   Calcium 9.3 8.9 - 10.3 mg/dL   GFR, Estimated >60 >60 mL/min    Comment: (NOTE) Calculated using the CKD-EPI Creatinine Equation (2021)    Anion gap 9 5 - 15    Comment: Performed at [facility] [address], [place], [place]  [address]  CBC     Status: Abnormal   Collection Time: 08/12/21  1:39 AM  Result Value Ref Range   WBC 5.3 4.0 - 10.5 K/uL   RBC 3.22 (L) 4.22 - 5.81 MIL/uL   Hemoglobin 10.3 (L) 13.0 - 17.0 g/dL   HCT 32.3 (L) 39.0 - 52.0 %   MCV 100.3 (H) 80.0 - 100.0 fL   MCH 32.0 26.0 - 34.0 pg   MCHC 31.9 30.0 - 36.0 g/dL   RDW 14.1 11.5 - 15.5 %   Platelets 170 150 - 400 K/uL   nRBC 0.0 0.0 - 0.2 %    Comment: Performed at [facility], [place] [address], [place], [place] [address]    No results found.  Review of Systems Blood pressure (!) 164/102, pulse 81, temperature 97.8 F (36.6 C), temperature source Oral, resp. rate 18, weight 58.6 kg, SpO2 91 %. Physical Exam Vitals reviewed.  Constitutional:      Comments: AOx2. Pleasant but with significant dementia.   HENT:     Nose: Nose normal.     Mouth/Throat:     Mouth: Mucous membranes are moist.  Cardiovascular:     Rate and Rhythm: Normal rate.  Pulmonary:     Effort: Pulmonary effort is normal.  Abdominal:     General: Abdomen is flat.  Genitourinary:    Comments: Large bore 3 way cathter with clear yellow urine off irrigation.  Musculoskeletal:     Cervical back: Normal range of motion.  Skin:    General: Skin is warm.  Neurological:     General: No focal deficit present.     Mental Status: He is alert.  Psychiatric:        Mood and Affect: Mood normal.    Assessment/Plan:  1 - Urinary Retention / Massive Prostate - likely due to severe BPH in setting of deconditioning. He is not operative candiate whatsoever. Rec max medical therapy with tamsulosin + finasteride, and to continue at discharge. Consider trial of void as outpatietn when / if his functional status would even allow steady ambulation and volitional voiding. Keep  current foley at discharge.   2 - Gross Hematuria - Expected in setting of chronc foley + large prostate + blood thinners. THis has already stopped by holding blood thinners. I strongly recommend  permanent cessation of blood thinners in this man who is at severe risk of falls and bleeding. Bleeding risk far exceeds small CVA risk from AFib.   3 - Bacteruria - agree with ABX, DC after 7 days. Sterile urine is not the goal.   We will request Urol FU in outpatient setting.   Alexis Frock 08/12/2021, 5:23 PM

## 2021-08-12 NOTE — TOC Initial Note (Addendum)
Transition of Care (TOC) - Initial/Assessment Note    Patient Details  Name: Parker Odonnell. MRN: [id number] Date of Birth: 1932/02/10  Transition of Care [facility]) CM/SW Contact:    Marilu Favre, RN Phone Number: 08/12/2021, 11:31 AM  Clinical Narrative:                  Spoke to patient's daughter Richarda Blade [phone] via phone. See previous TOC note.    Patient was at [facility] and suppose to discharge last Saturday, [name] visited on Friday and saw blood in urine so patient was brought to the hospital.   Rober Minion had already arranged wheel chair and hospital bed both were delivered, patient already had a 3 in 1.   Patient has an apartment at [employer]. Home health PT/OT was arranged through Arlina Robes spoke to [facility] [phone]. NCM called same and left message.    [name] has arranged caregivers for her dad.   Patient will need ambulance transport home at time of discharge.   [name] would like a referral to [facility] for palliative care. Referral for [facility] for palliative care given to Audrea Muscat with [facility]   After Urology sees her dad she would like an update.   [place] with [facility] checking to see what orders they will need    [place] with [facility] just called and stated Robin with [facility] called and gave Advanc e referral for HHRN,PT,OT,SP,aide and sw . WIll need orders  Expected Discharge Plan: [place] Barriers to Discharge: Continued Medical Work up   Patient Goals and CMS Choice   CMS Medicare.gov Compare Post Acute Care list provided to:: Patient Represenative (must comment) Choice offered to / list presented to : Adult Children Richarda Blade [phone])  Expected Discharge Plan and Services Expected Discharge Plan: [place]                         DME Arranged: N/A         HH Arranged: PT, OT          Prior Living Arrangements/Services    Lives with:: Self Patient language and need for interpreter reviewed:: Yes        Need for Family Participation in Patient Care: Yes (Comment) Care giver support system in place?: Yes (comment) Current home services: DME Criminal Activity/Legal Involvement Pertinent to Current Situation/Hospitalization: No - Comment as needed  Activities of Daily Living      Permission Sought/Granted   Permission granted to share information with : Yes, Verbal Permission Granted  Share Information with NAME: Richarda Blade [phone]           Emotional Assessment         Alcohol / Substance Use: Not Applicable Psych Involvement: No (comment)  Admission diagnosis:  Gross hematuria [R31.0] On apixaban therapy [Z79.01] Patient Active Problem List   Diagnosis Date Noted   Gross hematuria 79/39/0300   Complicated UTI (urinary tract infection) 08/10/2021   Cognitive retention disorder    Malnutrition of moderate degree 07/03/2021   Hyperkalemia    Hyponatremia    Neuropathic pain    Pressure injury of skin 06/26/2021   VZV (varicella-zoster virus) infection 06/25/2021   Vasculitis ([place])    Chronic diastolic congestive heart failure (HCC)    Persistent atrial fibrillation (HCC)    Weakness of both lower  extremities    Rash 06/10/2021   Weakness 06/10/2021   Chronic combined systolic (congestive) and diastolic (congestive) heart failure ([place]) 06/10/2021   DNR (do not resuscitate) 06/10/2021   OA (osteoarthritis) of hip 01/10/2020   Primary osteoarthritis of left hip 01/10/2020   Preoperative cardiovascular examination    Shortness of breath    Bilateral pleural effusion    Thrombocytopenia ([place]) 06/03/2018   Atrial flutter ([place]) 06/03/2018   Acute on chronic systolic CHF (congestive heart failure) ([place]) 06/03/2018   Bilateral edema of lower extremity    Essential hypertension 09/19/2015   LBBB (left bundle branch block) 09/19/2015   Hypokalemia 08/27/2014   ASCVD  (arteriosclerotic cardiovascular disease)    Dyslipidemia    ED (erectile dysfunction)    Left bundle branch block    Postsurgical percutaneous transluminal coronary angioplasty status    Coronary atherosclerosis of native coronary artery    Bilateral inguinal hernia 06/15/2012   PCP:  Lawerance Cruel, MD Pharmacy:   [id number] - [place], [place] [address] Phone: [phone] Fax: [phone]  [facility] - [place], [place] - [address] [place] [address] Phone: [phone] Fax: [phone]     Social Determinants of Health (SDOH) Interventions    Readmission Risk Interventions No flowsheet data found.

## 2021-08-13 DIAGNOSIS — N390 Urinary tract infection, site not specified: Secondary | ICD-10-CM

## 2021-08-13 DIAGNOSIS — I10 Essential (primary) hypertension: Secondary | ICD-10-CM

## 2021-08-13 DIAGNOSIS — I5032 Chronic diastolic (congestive) heart failure: Secondary | ICD-10-CM | POA: Diagnosis not present

## 2021-08-13 DIAGNOSIS — R310 Gross hematuria: Secondary | ICD-10-CM | POA: Diagnosis not present

## 2021-08-13 LAB — BASIC METABOLIC PANEL
Anion gap: 6 (ref 5–15)
BUN: 20 mg/dL (ref 8–23)
CO2: 23 mmol/L (ref 22–32)
Calcium: 9.4 mg/dL (ref 8.9–10.3)
Chloride: 110 mmol/L (ref 98–111)
Creatinine, Ser: 0.88 mg/dL (ref 0.61–1.24)
GFR, Estimated: >60 mL/min (ref >60)
Glucose, Bld: 106 mg/dL — ABNORMAL HIGH (ref 70–99)
Potassium: 4.4 mmol/L (ref 3.5–5.1)
Sodium: 139 mmol/L (ref 135–145)

## 2021-08-13 LAB — CBC
HCT: 33.3 % — ABNORMAL LOW (ref 39.0–52.0)
Hemoglobin: 10.4 g/dL — ABNORMAL LOW (ref 13.0–17.0)
MCH: 31 pg (ref 26.0–34.0)
MCHC: 31.2 g/dL (ref 30.0–36.0)
MCV: 99.4 fL (ref 80.0–100.0)
Platelets: 189 10*3/uL (ref 150–400)
RBC: 3.35 MIL/uL — ABNORMAL LOW (ref 4.22–5.81)
RDW: 14.3 % (ref 11.5–15.5)
WBC: 4.5 10*3/uL (ref 4.0–10.5)
nRBC: 0 % (ref 0.0–0.2)

## 2021-08-13 NOTE — Progress Notes (Signed)
[facility] PROGRESS NOTE    Progress Note  [place] Sink.  ZJQ:[id number] DOB: 1932/08/16 DOA: 08/10/2021 PCP: Lawerance Cruel, MD     Brief Narrative:   Parker Imbert. is an 87 y.o. male past medical history of paroxysmal atrial fibrillation on Eliquis, post herpetic neuralgia, diastolic chronic heart failure, essential hypertension, has had a Foley placed since August, DNR, recently treated for UTI with ciprofloxacin last week, as per daughter she reports he has been having dizziness generalized and gross hematuria with clots weakness urology was consulted and he was started on CBI daily relates that he has a massive prostate with urinary retention likely due to BPH, on max therapy Flomax and finasteride which will be continued upon discharge.  Nonoperative candidate    Assessment/Plan:   Gross hematuria due to massive prostate hyperplasia: In the setting of Eliquis and a indwelling Foley catheter. Urology was consulted placed on CBI now his hematuria has resolved. Will continue to hold Eliquis, his Foley has been exchanged. Urology relates to consider voiding trial as an outpatient. Urology recommends strongly against resuming Eliquis due to severe risk of falling and bleeding which I agreed. We will go back to her independent living facility with physical therapy, follow-up with urology in 2 weeks.  Complicated UTI: Recently treated with Cipro as an outpatient. Started empirically on Rocephin.  Urine culture showed multiple species  Essential hypertension: Coreg, blood pressure is relatively well controlled goal is less than 160/90.  CAD: Denies any symptoms continue Lipitor.  Paroxysmal atrial fibrillation: Continue Coreg, holding Eliquis neurology to dictate when to restart it.  Normocytic anemia: Hemoglobin is relatively stable.  Chronic diastolic heart failure: Appears euvolemic continue monitor strict I's and O's Daily weights.  Neuropathic  pain: Neurontin seems to be helping we will continue current regimen.  Debilitated/generalized weakness: Physical therapy evaluated the patient and recommended home health PT.  Goals of care: Patient is a DNR.  Stage II sacral decub ulcer: RN Pressure Injury Documentation: Pressure Injury 08/12/21 Coccyx Medial Stage 2 -  Partial thickness loss of dermis presenting as a shallow open injury with a red, pink wound bed without slough. many small areas stage 2 (Active)  08/12/21 0100  Location: Coccyx  Location Orientation: Medial  Staging: Stage 2 -  Partial thickness loss of dermis presenting as a shallow open injury with a red, pink wound bed without slough.  Wound Description (Comments): many small areas stage 2  Present on Admission: Yes     DVT prophylaxis: SCD's Family Communication:daughter Status is: Inpatient  Remains inpatient appropriate because: acute illness and gross hematuria.    Code Status:     Code Status Orders  (From admission, onward)           Start     Ordered   08/10/21 2346  Do not attempt resuscitation (DNR)  Continuous       Question Answer Comment  In the event of cardiac or respiratory ARREST Do not call a "code blue"   In the event of cardiac or respiratory ARREST Do not perform Intubation, CPR, defibrillation or ACLS   In the event of cardiac or respiratory ARREST Use medication by any route, position, wound care, and other measures to relive pain and suffering. May use oxygen, suction and manual treatment of airway obstruction as needed for comfort.      08/10/21 2345           Code Status History     Date  Active Date Inactive Code Status Order ID Comments User Context   07/12/2021 1227 07/18/2021 2330 DNR [id number]  Elmyra Ricks, NP Inpatient   06/25/2021 1802 07/12/2021 1227 Full Code [id number]  Cathlyn Parsons, PA-C Inpatient   06/25/2021 1802 06/25/2021 1802 DNR [id number]  Cathlyn Parsons, PA-C Inpatient   06/10/2021  1749 06/25/2021 1742 DNR [id number]  Karmen Bongo, MD ED   01/10/2020 1053 01/13/2020 2101 Full Code [id number]  Derl Barrow, PA Inpatient   11/13/2019 0815 11/13/2019 1712 Full Code [id number]  Jettie Booze, MD Inpatient   06/03/2018 2339 06/08/2018 1703 Full Code [id number]  Vianne Bulls, MD ED   06/15/2012 1248 06/16/2012 1835 Full Code [id number]  Whitaker, Marilynn Rail, RN Inpatient      Advance Directive Documentation    Flowsheet Row Most Recent Value  Type of Advance Directive Out of facility DNR (pink MOST or yellow form)  Pre-existing out of facility DNR order (yellow form or pink MOST form) --  "MOST" Form in Place? --         IV Access:   Peripheral IV   Procedures and diagnostic studies:   No results found.   Medical Consultants:   None.   Subjective:    Parker Sink. relates he feels much better is awake hungry wants to go to the bathroom.  Objective:    Vitals:   08/12/21 1544 08/12/21 2130 08/13/21 0559 08/13/21 0757  BP:  (!) 137/54 (!) 143/91 (!) 159/91  Pulse: 81 (!) 41 88 78  Resp:  18 18 18   Temp:  97.9 F (36.6 C) 98.1 F (36.7 C) 98 F (36.7 C)  TempSrc:  Oral Oral Oral  SpO2:  99% 100% 100%  Weight:       SpO2: 100 %   Intake/Output Summary (Last 24 hours) at 08/13/2021 7616 Last data filed at 08/12/2021 2200 Gross per 24 hour  Intake 1160 ml  Output 550 ml  Net 610 ml   Filed Weights   08/12/21 0433  Weight: 58.6 kg    Exam: General exam: In no acute distress. Respiratory system: Good air movement and clear to auscultation. Cardiovascular system: S1 & S2 heard, RRR. No JVD. Gastrointestinal system: Abdomen is nondistended, soft and nontender.  Extremities: No pedal edema. Skin: No rashes, lesions or ulcers Psychiatry: Judgement and insight appear normal.    Data Reviewed:    Labs: Basic Metabolic Panel: Recent Labs  Lab 08/10/21 1109 08/11/21 0445 08/12/21 0139 08/13/21 0139  NA 137 138 139  139  K 3.6 4.3 3.9 4.4  CL 105 108 109 110  CO2 25 22 21* 23  GLUCOSE 116* 82 99 106*  BUN 20 18 17 20   CREATININE 0.96 0.88 0.91 0.88  CALCIUM 9.7 9.2 9.3 9.4   GFR Estimated Creatinine Clearance: 49.9 mL/min (by C-G formula based on SCr of 0.88 mg/dL). Liver Function Tests: Recent Labs  Lab 08/10/21 1109  AST 29  ALT 31  ALKPHOS 87  BILITOT 2.1*  PROT 6.5  ALBUMIN 2.7*   No results for input(s): LIPASE, AMYLASE in the last 168 hours. No results for input(s): AMMONIA in the last 168 hours. Coagulation profile Recent Labs  Lab 08/10/21 1109  INR 1.7*   COVID-19 Labs  No results for input(s): DDIMER, FERRITIN, LDH, CRP in the last 72 hours.  Lab Results  Component Value Date   SARSCOV2NAA NEGATIVE 08/10/2021   SARSCOV2NAA NEGATIVE 07/17/2021   SARSCOV2NAA NEGATIVE 07/14/2021   SARSCOV2NAA NEGATIVE  06/10/2021    CBC: Recent Labs  Lab 08/10/21 1109 08/11/21 0445 08/12/21 0139 08/13/21 0139  WBC 5.9 6.9 5.3 4.5  NEUTROABS 4.6  --   --   --   HGB 11.3* 10.8* 10.3* 10.4*  HCT 35.8* 34.3* 32.3* 33.3*  MCV 101.7* 101.8* 100.3* 99.4  PLT 191 170 170 189   Cardiac Enzymes: No results for input(s): CKTOTAL, CKMB, CKMBINDEX, TROPONINI in the last 168 hours. BNP (last 3 results) No results for input(s): PROBNP in the last 8760 hours. CBG: No results for input(s): GLUCAP in the last 168 hours. D-Dimer: No results for input(s): DDIMER in the last 72 hours. Hgb A1c: No results for input(s): HGBA1C in the last 72 hours. Lipid Profile: No results for input(s): CHOL, HDL, LDLCALC, TRIG, CHOLHDL, LDLDIRECT in the last 72 hours. Thyroid function studies: No results for input(s): TSH, T4TOTAL, T3FREE, THYROIDAB in the last 72 hours.  Invalid input(s): FREET3 Anemia work up: No results for input(s): VITAMINB12, FOLATE, FERRITIN, TIBC, IRON, RETICCTPCT in the last 72 hours. Sepsis Labs: Recent Labs  Lab 08/10/21 1109 08/11/21 0445 08/12/21 0139 08/13/21 0139   WBC 5.9 6.9 5.3 4.5   Microbiology Recent Results (from the past 240 hour(s))  Urine Culture     Status: Abnormal   Collection Time: 08/10/21 11:09 AM   Specimen: Urine, Catheterized  Result Value Ref Range Status   Specimen Description URINE, CATHETERIZED  Final   Special Requests   Final    NONE Performed at [facility], [address], [place], [place] [address]    Culture MULTIPLE SPECIES PRESENT, SUGGEST RECOLLECTION (A)  Final   Report Status 08/11/2021 FINAL  Final  Resp Panel by RT-PCR (Flu A&B, Covid) Nasopharyngeal Swab     Status: None   Collection Time: 08/10/21  7:09 PM   Specimen: Nasopharyngeal Swab; Nasopharyngeal(NP) swabs in vial transport medium  Result Value Ref Range Status   SARS Coronavirus 2 by RT PCR NEGATIVE NEGATIVE Final    Comment: (NOTE) SARS-CoV-2 target nucleic acids are NOT DETECTED.  The SARS-CoV-2 RNA is generally detectable in upper respiratory specimens during the acute phase of infection. The lowest concentration of SARS-CoV-2 viral copies this assay can detect is 138 copies/mL. A negative result does not preclude SARS-Cov-2 infection and should not be used as the sole basis for treatment or other patient management decisions. A negative result may occur with  improper specimen collection/handling, submission of specimen other than nasopharyngeal swab, presence of viral mutation(s) within the areas targeted by this assay, and inadequate number of viral copies(<138 copies/mL). A negative result must be combined with clinical observations, patient history, and epidemiological information. The expected result is Negative.  Fact Sheet for Patients:  EntrepreneurPulse.com.au  Fact Sheet for Healthcare Providers:  IncredibleEmployment.be  This test is no t yet approved or cleared by the [place] FDA and  has been authorized for detection and/or diagnosis of SARS-CoV-2 by FDA under an Emergency  Use Authorization (EUA). This EUA will remain  in effect (meaning this test can be used) for the duration of the COVID-19 declaration under Section 564(b)(1) of the Act, 21 U.S.C.section 360bbb-3(b)(1), unless the authorization is terminated  or revoked sooner.       Influenza A by PCR NEGATIVE NEGATIVE Final   Influenza B by PCR NEGATIVE NEGATIVE Final    Comment: (NOTE) The Xpert Xpress SARS-CoV-2/FLU/RSV plus assay is intended as an aid in the diagnosis of influenza from Nasopharyngeal swab specimens and should not  be used as a sole basis for treatment. Nasal washings and aspirates are unacceptable for Xpert Xpress SARS-CoV-2/FLU/RSV testing.  Fact Sheet for Patients: EntrepreneurPulse.com.au  Fact Sheet for Healthcare Providers: IncredibleEmployment.be  This test is not yet approved or cleared by the [place] FDA and has been authorized for detection and/or diagnosis of SARS-CoV-2 by FDA under an Emergency Use Authorization (EUA). This EUA will remain in effect (meaning this test can be used) for the duration of the COVID-19 declaration under Section 564(b)(1) of the Act, 21 U.S.C. section 360bbb-3(b)(1), unless the authorization is terminated or revoked.  Performed at [facility], [place] [address], [place], [place] [address]   Urine Culture     Status: None   Collection Time: 08/11/21  2:10 PM   Specimen: Urine, Catheterized  Result Value Ref Range Status   Specimen Description URINE, CATHETERIZED  Final   Special Requests NONE  Final   Culture   Final    NO GROWTH Performed at [facility], [address] [place], [place] [address]    Report Status 08/12/2021 FINAL  Final     Medications:    atorvastatin  40 mg Oral QHS   B-complex with vitamin C  1 tablet Oral Daily   carvedilol  6.25 mg Oral BID WC   Chlorhexidine Gluconate Cloth  6 each Topical Daily   finasteride  5 mg Oral Daily   gabapentin  100 mg  Oral TID   multivitamin with minerals  1 tablet Oral QODAY   tamsulosin  0.4 mg Oral Daily   Continuous Infusions:  cefTRIAXone (ROCEPHIN)  IV 1 g (08/12/21 0939)   sodium chloride irrigation        LOS: 3 days   Parker Odonnell  [facility]  08/13/2021, 8:21 AM

## 2021-08-13 NOTE — Care Management Important Message (Signed)
Important Message  Patient Details  Name: Parker Odonnell. MRN: [id number] Date of Birth: 08/06/1932   Medicare Important Message Given:  Yes     Ata Pecha 08/13/2021, 2:13 PM

## 2021-08-13 NOTE — TOC Progression Note (Addendum)
Transition of Care (TOC) - Progression Note    Patient Details  Name: Parker Odonnell. MRN: [id number] Date of Birth: 1931-11-19  Transition of Care [facility]) CM/SW Contact  Chrishon Martino, Edson Snowball, RN Phone Number: 08/13/2021, 9:55 AM  Clinical Narrative:     MD messaged NCM discharge is tomorrow. MD discussed with daughter on phone.   NCM messaged Theatre stage manager at [employer] [place] with [employer].  Tomorrow when discharge is confirmed will arrange ambulance transport home.   Misty Green with [facility] aware also   Spoke to daughter she is aware of all of above. She has [employer] scheduled to start tomorrow at 3pm, if patient arrives home prior to that time one of his children will be present.   NCM will call daughter tomorrow morning prior to arranging transport. NCM confirmed address with daughter.  Expected Discharge Plan: [place] Barriers to Discharge: Continued Medical Work up  Expected Discharge Plan and Services Expected Discharge Plan: [place]                         DME Arranged: N/A         HH Arranged: PT, OT           Social Determinants of Health (SDOH) Interventions    Readmission Risk Interventions No flowsheet data found.

## 2021-08-14 DIAGNOSIS — I5032 Chronic diastolic (congestive) heart failure: Secondary | ICD-10-CM | POA: Diagnosis not present

## 2021-08-14 DIAGNOSIS — N390 Urinary tract infection, site not specified: Secondary | ICD-10-CM | POA: Diagnosis not present

## 2021-08-14 DIAGNOSIS — R310 Gross hematuria: Secondary | ICD-10-CM | POA: Diagnosis not present

## 2021-08-14 DIAGNOSIS — Z66 Do not resuscitate: Secondary | ICD-10-CM

## 2021-08-14 LAB — BASIC METABOLIC PANEL
Anion gap: 7 (ref 5–15)
BUN: 17 mg/dL (ref 8–23)
CO2: 21 mmol/L — ABNORMAL LOW (ref 22–32)
Calcium: 9.1 mg/dL (ref 8.9–10.3)
Chloride: 109 mmol/L (ref 98–111)
Creatinine, Ser: 0.86 mg/dL (ref 0.61–1.24)
GFR, Estimated: >60 mL/min (ref >60)
Glucose, Bld: 107 mg/dL — ABNORMAL HIGH (ref 70–99)
Potassium: 3.4 mmol/L — ABNORMAL LOW (ref 3.5–5.1)
Sodium: 137 mmol/L (ref 135–145)

## 2021-08-14 LAB — CBC
HCT: 32 % — ABNORMAL LOW (ref 39.0–52.0)
Hemoglobin: 10.3 g/dL — ABNORMAL LOW (ref 13.0–17.0)
MCH: 31.9 pg (ref 26.0–34.0)
MCHC: 32.2 g/dL (ref 30.0–36.0)
MCV: 99.1 fL (ref 80.0–100.0)
Platelets: 174 10*3/uL (ref 150–400)
RBC: 3.23 MIL/uL — ABNORMAL LOW (ref 4.22–5.81)
RDW: 14 % (ref 11.5–15.5)
WBC: 3.9 10*3/uL — ABNORMAL LOW (ref 4.0–10.5)
nRBC: 0 % (ref 0.0–0.2)

## 2021-08-14 MED ORDER — GABAPENTIN 100 MG PO CAPS: 100.0000 mg | ORAL_CAPSULE | Freq: Two times a day (BID) | ORAL | 0 refills | Status: AC

## 2021-08-14 MED ORDER — TAMSULOSIN HCL 0.4 MG PO CAPS: 0.4000 mg | ORAL_CAPSULE | Freq: Every day | ORAL | 3 refills | Status: AC

## 2021-08-14 MED ORDER — CEFPODOXIME PROXETIL 200 MG PO TABS: 200.0000 mg | ORAL_TABLET | Freq: Two times a day (BID) | ORAL | 0 refills | Status: DC

## 2021-08-14 MED ORDER — HYDROCODONE-ACETAMINOPHEN 5-325 MG PO TABS: 1.0000 | ORAL_TABLET | Freq: Four times a day (QID) | ORAL | 0 refills | Status: AC | PRN

## 2021-08-14 MED ORDER — FINASTERIDE 5 MG PO TABS: 5.0000 mg | ORAL_TABLET | Freq: Every day | ORAL | 3 refills | Status: AC

## 2021-08-14 NOTE — Progress Notes (Signed)
Physical Therapy Treatment Patient Details Name: Parker Odonnell. MRN: [id number] DOB: 1931/10/22 Today's Date: 08/14/2021   History of Present Illness Pt is an 87 y/o male admitted 10/23 from SNF secondary to hematuria. Pt had foley placed on 10/21. PMH includes HTN, CAD, and L THA.    PT Comments    Patient progressing with activity tolerance with mobility though still c/o generalized and especially buttocks pain.  He is planning to d/c today with caregiver assists.  He was able to sit up to EOB and stand briefly x 2 to RW with +2 assist.  Agree with home with home PT, aide, RN and hired caregivers.     Recommendations for follow up therapy are one component of a multi-disciplinary discharge planning process, led by the attending physician.  Recommendations may be updated based on patient status, additional functional criteria and insurance authorization.  Follow Up Recommendations  Home health PT     Assistance Recommended at Discharge Frequent or constant Supervision/Assistance  Equipment Recommendations  Wheelchair cushion (measurements PT);Wheelchair (measurements PT);3in1 (PT);Hospital bed;Other (comment)    Recommendations for Other Services       Precautions / Restrictions Precautions Precautions: Fall     Mobility  Bed Mobility Overal bed mobility: Needs Assistance Bed Mobility: Supine to Sit;Sit to Supine     Supine to sit: Mod assist;+2 for safety/equipment;HOB elevated Sit to supine: Mod assist;+2 for safety/equipment;+2 for physical assistance   General bed mobility comments: pulling up with rails and recognizing his need for assist, asking helpers to slow down and assist for legs off bed, lifting trunk, balance and scooting to EOB    Transfers Overall transfer level: Needs assistance Equipment used: Rolling walker (2 wheels) Transfers: Sit to/from Stand Sit to Stand: Mod assist;+2 physical assistance;From elevated surface           General transfer  comment: pt pulling up on stabilized walker, assist for lifting and pt able to stand several seconds while PT applied barrier cream to buttocks and scrotum due to sore and red; stood second time but unable to take steps to [facility] bilat knees buckling    Ambulation/Gait                 Stairs             Wheelchair Mobility    Modified Rankin (Stroke Patients Only)       Balance Overall balance assessment: Needs assistance Sitting-balance support: Feet supported Sitting balance-Leahy Scale: Poor Sitting balance - Comments: stabilizes briefly with UE support, but support given behind his back for prolonged sitting     Standing balance-Leahy Scale: Poor Standing balance comment: UE support and min A +2 for balance                            Cognition Arousal/Alertness: Awake/alert Behavior During Therapy: Restless Overall Cognitive Status: No family/caregiver present to determine baseline cognitive functioning                                 General Comments: able to relate his pain issues and that he wants cream on his buttocks, also recognized when he needed to sit and could not take steps, but pt calling out for family not here when no one in the room and confused to time, situation and place        Exercises  General Comments        Pertinent Vitals/Pain Faces Pain Scale: Hurts whole lot Pain Location: buttocks and generalized Pain Descriptors / Indicators: Grimacing;Guarding;Moaning Pain Intervention(s): Monitored during session;Repositioned;Other (comment) (applied cream to buttocks and off loaded)    Home Living                          Prior Function            PT Goals (current goals can now be found in the care plan section) Progress towards PT goals: Progressing toward goals    Frequency    Min 2X/week      PT Plan Current plan remains appropriate    Co-evaluation              AM-PAC  PT "6 Clicks" Mobility   Outcome Measure  Help needed turning from your back to your side while in a flat bed without using bedrails?: A Little Help needed moving from lying on your back to sitting on the side of a flat bed without using bedrails?: A Lot Help needed moving to and from a bed to a chair (including a wheelchair)?: A Lot Help needed standing up from a chair using your arms (e.g., wheelchair or bedside chair)?: A Lot Help needed to walk in hospital room?: Total Help needed climbing 3-5 steps with a railing? : Total 6 Click Score: 11    End of Session   Activity Tolerance: Patient limited by fatigue Patient left: in bed;with call bell/phone within reach;with bed alarm set   PT Visit Diagnosis: Difficulty in walking, not elsewhere classified (R26.2);Other abnormalities of gait and mobility (R26.89)     Time: 1123-1150 PT Time Calculation (min) (ACUTE ONLY): 27 min  Charges:  $Therapeutic Activity: 23-37 mins                     Magda Kiel, PT Acute Rehabilitation Services QAESL:[phone] Office:[phone] 08/14/2021     Reginia Naas 08/14/2021, 12:42 PM

## 2021-08-14 NOTE — Progress Notes (Signed)
Discharge instructions given to daughter Richarda Blade [phone]. Daughter verbalized understanding of all teaching. Norco pain med added to AVS per daughter request and sent into pharmacy for pt.

## 2021-08-14 NOTE — Progress Notes (Signed)
Pt discharged to home via [facility] with all belongings.

## 2021-08-14 NOTE — Discharge Summary (Addendum)
Physician Discharge Summary  [place] Sink. UVO:[id number] DOB: 1932-01-29 DOA: 08/10/2021  PCP: Lawerance Cruel, MD  Admit date: 08/10/2021 Discharge date: 08/14/2021  Admitted From: ILF Disposition:  ILF  Recommendations for Outpatient Follow-up:  Follow up with PCP in 1-2 weeks Please obtain BMP/CBC in one week Palliative Care to meet with family at facility.  Home Health:no Equipment/Devices:none  Discharge Condition:Stable CODE STATUS:Full Diet recommendation: Heart Healthy   Brief/Interim Summary: 87 y.o. male past medical history of paroxysmal atrial fibrillation on Eliquis, post herpetic neuralgia, diastolic chronic heart failure, essential hypertension, has had a Foley placed since August, DNR, recently treated for UTI with ciprofloxacin last week, as per daughter she reports he has been having dizziness generalized and gross hematuria with clots weakness urology was consulted and he was started on CBI daily relates that he has a massive prostate with urinary retention likely due to BPH, on max therapy Flomax and finasteride which will be continued upon discharge.  Nonoperative candidate  Discharge Diagnoses:  Principal Problem:   Gross hematuria Active Problems:   Coronary atherosclerosis of native coronary artery   Essential hypertension   DNR (do not resuscitate)   Chronic diastolic congestive heart failure (HCC)   Neuropathic pain   Complicated UTI (urinary tract infection)  Gross hematuria due to massive prostate hyperplasia: In the setting of Eliquis and indwelling Foley catheter. Urology was consulted CBI was performed and hematuria has now resolved. Urology recommended to avoid Eliquis, and follow-up with them as an outpatient for voiding trial. Urology recommended strongly against Eliquis due to his high risk of bleeding. He is also high risk for fall we discussed with the family we have agreed to keep him off Eliquis. He will go back to his  independent living facility and follow-up with urology as an outpatient.  Complicated UTI:  recently treated with Cipro, started here on Rocephin urine culture showed multiple species he will finish his antibiotic treatment with Vantin as an outpatient for 7 more days.  Essential hypertension: No change made to his medication.  CAD: Continue with statins.  Paroxysmal atrial fibrillation: Rate controlled on Coreg, not a candidate for Eliquis due to his high risk of bleeding.  Normocytic anemia: His hemoglobin remained relatively stable.  Chronic diastolic heart failure: No changes made to his medication he appears euvolemic.  Neuropathic pain: Neurontin seems to be helping we will continue this medication no changes made.  Debilitated/generalized weakness: Physical therapy evaluated the patient and recommended home health PT.  Discharge Instructions  Discharge Instructions     Diet - low sodium heart healthy   Complete by: As directed    Diet - low sodium heart healthy   Complete by: As directed    Discharge wound care:   Complete by: As directed    Stage II sacral decubitus ulcer with partial thickness and loss of dermis   Discharge wound care:   Complete by: As directed    Stage II Coccyx pressure ulcer please follow wound care instructions   Increase activity slowly   Complete by: As directed    Increase activity slowly   Complete by: As directed       Allergies as of 08/14/2021   No Known Allergies      Medication List     STOP taking these medications    b complex vitamins tablet   bisacodyl 10 MG suppository Commonly known as: DULCOLAX   cloNIDine 0.1 mg/24hr patch Commonly known as: CATAPRES - Dosed in mg/24  hr   Eliquis 2.5 MG Tabs tablet Generic drug: apixaban   meclizine 12.5 MG tablet Commonly known as: ANTIVERT   multivitamin with minerals Tabs tablet   OCUVITE-LUTEIN PO   RA SALINE ENEMA RE       TAKE these medications     acetaminophen 325 MG tablet Commonly known as: TYLENOL Take 2 tablets (650 mg total) by mouth every 6 (six) hours as needed for mild pain (or Fever >/= 101).   atorvastatin 40 MG tablet Commonly known as: LIPITOR TAKE ONE TABLET BY MOUTH EVERYDAY AT BEDTIME What changed: See the new instructions.   carvedilol 6.25 MG tablet Commonly known as: COREG Take 1 tablet (6.25 mg total) by mouth 2 (two) times daily with a meal.   cefpodoxime 200 MG tablet Commonly known as: VANTIN Take 1 tablet (200 mg total) by mouth 2 (two) times daily for 7 days.   Ensure Plus Liqd Take 237 mLs by mouth 2 (two) times daily between meals.   finasteride 5 MG tablet Commonly known as: PROSCAR Take 1 tablet (5 mg total) by mouth daily.   gabapentin 100 MG capsule Commonly known as: NEURONTIN Take 1 capsule (100 mg total) by mouth 2 (two) times daily. What changed: when to take this   HYDROcodone-acetaminophen 5-325 MG tablet Commonly known as: NORCO/VICODIN Take 1 tablet by mouth every 6 (six) hours as needed for up to 5 days for moderate pain or severe pain.   lidocaine 5 % Commonly known as: LIDODERM Place 1 patch onto the skin daily. Remove & Discard patch within 12 hours or as directed by MD   magnesium hydroxide 400 MG/5ML suspension Commonly known as: MILK OF MAGNESIA Take 30 mLs by mouth daily as needed for mild constipation.   polyethylene glycol 17 g packet Commonly known as: MIRALAX / GLYCOLAX Take 17 g by mouth daily.   tamsulosin 0.4 MG Caps capsule Commonly known as: FLOMAX Take 1 capsule (0.4 mg total) by mouth daily.   traMADol 50 MG tablet Commonly known as: ULTRAM Take 50 mg by mouth every 6 (six) hours as needed for moderate pain.               Discharge Care Instructions  (From admission, onward)           Start     Ordered   08/14/21 0000  Discharge wound care:       Comments: Stage II sacral decubitus ulcer with partial thickness and loss of dermis    08/14/21 0956   08/14/21 0000  Discharge wound care:       Comments: Stage II Coccyx pressure ulcer please follow wound care instructions   08/14/21 0957            Follow-up Information     [facility] up.   Contact information: [place] [address] [phone]        Alexis Frock, MD Follow up in 1 week(s).   Specialty: Urology Why: follow up as an outpatient Contact information: [place] [id number] [phone]                No Known Allergies  Consultations: Urology   Procedures/Studies: CT Renal Stone Study  Result Date: 08/10/2021 CLINICAL DATA:  Hematuria.  Foley catheter since Friday. EXAM: CT ABDOMEN AND PELVIS WITHOUT CONTRAST TECHNIQUE: Multidetector CT imaging of the abdomen and pelvis was performed following the standard protocol without IV contrast. COMPARISON:  06/14/2021 FINDINGS:  Lower chest: Motion degraded images. No acute abnormality. Coronary artery calcifications noted. Hepatobiliary: Cholecystectomy.  Liver is homogeneous. Pancreas: Unremarkable. No pancreatic ductal dilatation or surrounding inflammatory changes. Spleen: Normal in size without focal abnormality. Adrenals/Urinary Tract: The adrenal glands are normal. LEFT kidney: 5.5 centimeters cyst in the UPPER pole region. Intrarenal calculi measure 2-3 millimeters throughout the LEFT kidney. There is no hydronephrosis. Parapelvic cysts are noted. The LEFT ureter is unremarkable. RIGHT kidney: Intrarenal calculi measure 2-4 millimeters throughout the RIGHT kidney. There is no hydronephrosis. Parapelvic renal cysts are present. The RIGHT ureter is unremarkable. Bladder: A Foley catheter decompresses the urinary bladder. There is significant streak artifact from hardware in the pelvis which limits evaluation of the distal ureters and urinary bladder. Stomach/Bowel: Stomach and small bowel loops are normal in appearance. There  are numerous colonic diverticula. No associated inflammatory changes. Average stool burden. The appendix is not well seen. Vascular/Lymphatic: There is atherosclerotic calcification of the abdominal aorta. No associated aneurysm. There is normal vascular opacification of the celiac axis, superior mesenteric artery, and inferior mesenteric artery. Normal appearance of the portal venous system and inferior vena cava. No retroperitoneal or mesenteric adenopathy. Reproductive: The prostate is significantly enlarged. Prostatic calcifications are noted. Other: No ascites.  Abdominal wall is unremarkable. Musculoskeletal: Significant degenerative changes in the RIGHT hip. Status post LEFT hip arthroplasty, with significant streak artifact. Moderate degenerative changes throughout the lumbar spine. Stable appearance of superior endplate fracture of L1. IMPRESSION: 1. Multiple nonobstructing intrarenal calculi bilaterally. No evidence for ureteral abnormality. Foley catheter. Streak artifact limits full evaluation of the distal ureters and urinary bladder. However, no obstruction is suspected. 2. Marked enlargement of prostate, similar in appearance to prior study. 3. Colonic diverticulosis without acute diverticulitis. 4.  Aortic atherosclerosis.  (ICD10-I70.0) 5. Status post LEFT hip arthroplasty. Degenerative changes of the RIGHT hip. 6. Stable L1 compression fracture. Electronically Signed   By: Nolon Nations M.D.   On: 08/10/2021 12:14    Subjective: No complaints  Discharge Exam: Vitals:   08/14/21 0835 08/14/21 0904  BP: (!) 149/65 (!) 159/93  Pulse: 87 80  Resp: 18 18  Temp: 97.9 F (36.6 C) 98.4 F (36.9 C)  SpO2: 97%    Vitals:   08/13/21 2002 08/14/21 0429 08/14/21 0835 08/14/21 0904  BP: (!) 157/87 (!) 155/86 (!) 149/65 (!) 159/93  Pulse: 68 81 87 80  Resp: 17 18 18 18   Temp: 98.3 F (36.8 C) 98.5 F (36.9 C) 97.9 F (36.6 C) 98.4 F (36.9 C)  TempSrc: Oral Oral Oral Oral  SpO2: 100%  100% 97%   Weight:        General: Pt is alert, awake, not in acute distress Cardiovascular: RRR, S1/S2 +, no rubs, no gallops Respiratory: CTA bilaterally, no wheezing, no rhonchi Abdominal: Soft, NT, ND, bowel sounds + Extremities: no edema, no cyanosis    The results of significant diagnostics from this hospitalization (including imaging, microbiology, ancillary and laboratory) are listed below for reference.     Microbiology: Recent Results (from the past 240 hour(s))  Urine Culture     Status: Abnormal   Collection Time: 08/10/21 11:09 AM   Specimen: Urine, Catheterized  Result Value Ref Range Status   Specimen Description URINE, CATHETERIZED  Final   Special Requests   Final    NONE Performed at [facility], [address], [place], [place] [address]    Culture MULTIPLE SPECIES PRESENT, SUGGEST RECOLLECTION (A)  Final   Report Status 08/11/2021  FINAL  Final  Resp Panel by RT-PCR (Flu A&B, Covid) Nasopharyngeal Swab     Status: None   Collection Time: 08/10/21  7:09 PM   Specimen: Nasopharyngeal Swab; Nasopharyngeal(NP) swabs in vial transport medium  Result Value Ref Range Status   SARS Coronavirus 2 by RT PCR NEGATIVE NEGATIVE Final    Comment: (NOTE) SARS-CoV-2 target nucleic acids are NOT DETECTED.  The SARS-CoV-2 RNA is generally detectable in upper respiratory specimens during the acute phase of infection. The lowest concentration of SARS-CoV-2 viral copies this assay can detect is 138 copies/mL. A negative result does not preclude SARS-Cov-2 infection and should not be used as the sole basis for treatment or other patient management decisions. A negative result may occur with  improper specimen collection/handling, submission of specimen other than nasopharyngeal swab, presence of viral mutation(s) within the areas targeted by this assay, and inadequate number of viral copies(<138 copies/mL). A negative result must be combined with clinical  observations, patient history, and epidemiological information. The expected result is Negative.  Fact Sheet for Patients:  EntrepreneurPulse.com.au  Fact Sheet for Healthcare Providers:  IncredibleEmployment.be  This test is no t yet approved or cleared by the [place] FDA and  has been authorized for detection and/or diagnosis of SARS-CoV-2 by FDA under an Emergency Use Authorization (EUA). This EUA will remain  in effect (meaning this test can be used) for the duration of the COVID-19 declaration under Section 564(b)(1) of the Act, 21 U.S.C.section 360bbb-3(b)(1), unless the authorization is terminated  or revoked sooner.       Influenza A by PCR NEGATIVE NEGATIVE Final   Influenza B by PCR NEGATIVE NEGATIVE Final    Comment: (NOTE) The Xpert Xpress SARS-CoV-2/FLU/RSV plus assay is intended as an aid in the diagnosis of influenza from Nasopharyngeal swab specimens and should not be used as a sole basis for treatment. Nasal washings and aspirates are unacceptable for Xpert Xpress SARS-CoV-2/FLU/RSV testing.  Fact Sheet for Patients: EntrepreneurPulse.com.au  Fact Sheet for Healthcare Providers: IncredibleEmployment.be  This test is not yet approved or cleared by the [place] FDA and has been authorized for detection and/or diagnosis of SARS-CoV-2 by FDA under an Emergency Use Authorization (EUA). This EUA will remain in effect (meaning this test can be used) for the duration of the COVID-19 declaration under Section 564(b)(1) of the Act, 21 U.S.C. section 360bbb-3(b)(1), unless the authorization is terminated or revoked.  Performed at [facility], [place] [address], [place], [place] [address]   Urine Culture     Status: None   Collection Time: 08/11/21  2:10 PM   Specimen: Urine, Catheterized  Result Value Ref Range Status   Specimen Description URINE, CATHETERIZED  Final   Special  Requests NONE  Final   Culture   Final    NO GROWTH Performed at [facility], [address] [place], [place] [address]    Report Status 08/12/2021 FINAL  Final     Labs: BNP (last 3 results) Recent Labs    06/10/21 1310 06/18/21 0140 06/19/21 0320  BNP 161.8* 106.3* 938.1*   Basic Metabolic Panel: Recent Labs  Lab 08/10/21 1109 08/11/21 0445 08/12/21 0139 08/13/21 0139 08/14/21 0203  NA 137 138 139 139 137  K 3.6 4.3 3.9 4.4 3.4*  CL 105 108 109 110 109  CO2 25 22 21* 23 21*  GLUCOSE 116* 82 99 106* 107*  BUN 20 18 17 20 17   CREATININE 0.96 0.88 0.91 0.88 0.86  CALCIUM 9.7 9.2 9.3 9.4 9.1   Liver Function Tests: Recent Labs  Lab 08/10/21 1109  AST 29  ALT 31  ALKPHOS 87  BILITOT 2.1*  PROT 6.5  ALBUMIN 2.7*   No results for input(s): LIPASE, AMYLASE in the last 168 hours. No results for input(s): AMMONIA in the last 168 hours. CBC: Recent Labs  Lab 08/10/21 1109 08/11/21 0445 08/12/21 0139 08/13/21 0139 08/14/21 0203  WBC 5.9 6.9 5.3 4.5 3.9*  NEUTROABS 4.6  --   --   --   --   HGB 11.3* 10.8* 10.3* 10.4* 10.3*  HCT 35.8* 34.3* 32.3* 33.3* 32.0*  MCV 101.7* 101.8* 100.3* 99.4 99.1  PLT 191 170 170 189 174   Cardiac Enzymes: No results for input(s): CKTOTAL, CKMB, CKMBINDEX, TROPONINI in the last 168 hours. BNP: Invalid input(s): POCBNP CBG: No results for input(s): GLUCAP in the last 168 hours. D-Dimer No results for input(s): DDIMER in the last 72 hours. Hgb A1c No results for input(s): HGBA1C in the last 72 hours. Lipid Profile No results for input(s): CHOL, HDL, LDLCALC, TRIG, CHOLHDL, LDLDIRECT in the last 72 hours. Thyroid function studies No results for input(s): TSH, T4TOTAL, T3FREE, THYROIDAB in the last 72 hours.  Invalid input(s): FREET3 Anemia work up No results for input(s): VITAMINB12, FOLATE, FERRITIN, TIBC, IRON, RETICCTPCT in the last 72 hours. Urinalysis    Component Value Date/Time   COLORURINE RED (A)  08/10/2021 1109   APPEARANCEUR TURBID (A) 08/10/2021 1109   LABSPEC  08/10/2021 1109    TEST NOT REPORTED DUE TO COLOR INTERFERENCE OF URINE PIGMENT   PHURINE  08/10/2021 1109    TEST NOT REPORTED DUE TO COLOR INTERFERENCE OF URINE PIGMENT   GLUCOSEU (A) 08/10/2021 1109    TEST NOT REPORTED DUE TO COLOR INTERFERENCE OF URINE PIGMENT   HGBUR (A) 08/10/2021 1109    TEST NOT REPORTED DUE TO COLOR INTERFERENCE OF URINE PIGMENT   BILIRUBINUR (A) 08/10/2021 1109    TEST NOT REPORTED DUE TO COLOR INTERFERENCE OF URINE PIGMENT   KETONESUR (A) 08/10/2021 1109    TEST NOT REPORTED DUE TO COLOR INTERFERENCE OF URINE PIGMENT   PROTEINUR (A) 08/10/2021 1109    TEST NOT REPORTED DUE TO COLOR INTERFERENCE OF URINE PIGMENT   UROBILINOGEN 0.2 06/13/2012 1027   NITRITE (A) 08/10/2021 1109    TEST NOT REPORTED DUE TO COLOR INTERFERENCE OF URINE PIGMENT   LEUKOCYTESUR (A) 08/10/2021 1109    TEST NOT REPORTED DUE TO COLOR INTERFERENCE OF URINE PIGMENT   Sepsis Labs Invalid input(s): PROCALCITONIN,  WBC,  LACTICIDVEN Microbiology Recent Results (from the past 240 hour(s))  Urine Culture     Status: Abnormal   Collection Time: 08/10/21 11:09 AM   Specimen: Urine, Catheterized  Result Value Ref Range Status   Specimen Description URINE, CATHETERIZED  Final   Special Requests   Final    NONE Performed at [facility], [place] [address] [place], [place] [address]    Culture MULTIPLE SPECIES PRESENT, SUGGEST RECOLLECTION (A)  Final   Report Status 08/11/2021 FINAL  Final  Resp Panel by RT-PCR (Flu A&B, Covid) Nasopharyngeal Swab     Status: None   Collection Time: 08/10/21  7:09 PM   Specimen: Nasopharyngeal Swab; Nasopharyngeal(NP) swabs in vial transport medium  Result Value Ref Range Status   SARS Coronavirus 2 by RT PCR NEGATIVE NEGATIVE Final    Comment: (NOTE) SARS-CoV-2 target nucleic acids are NOT DETECTED.  The SARS-CoV-2 RNA is generally  detectable in upper respiratory specimens  during the acute phase of infection. The lowest concentration of SARS-CoV-2 viral copies this assay can detect is 138 copies/mL. A negative result does not preclude SARS-Cov-2 infection and should not be used as the sole basis for treatment or other patient management decisions. A negative result may occur with  improper specimen collection/handling, submission of specimen other than nasopharyngeal swab, presence of viral mutation(s) within the areas targeted by this assay, and inadequate number of viral copies(<138 copies/mL). A negative result must be combined with clinical observations, patient history, and epidemiological information. The expected result is Negative.  Fact Sheet for Patients:  EntrepreneurPulse.com.au  Fact Sheet for Healthcare Providers:  IncredibleEmployment.be  This test is no t yet approved or cleared by the [place] FDA and  has been authorized for detection and/or diagnosis of SARS-CoV-2 by FDA under an Emergency Use Authorization (EUA). This EUA will remain  in effect (meaning this test can be used) for the duration of the COVID-19 declaration under Section 564(b)(1) of the Act, 21 U.S.C.section 360bbb-3(b)(1), unless the authorization is terminated  or revoked sooner.       Influenza A by PCR NEGATIVE NEGATIVE Final   Influenza B by PCR NEGATIVE NEGATIVE Final    Comment: (NOTE) The Xpert Xpress SARS-CoV-2/FLU/RSV plus assay is intended as an aid in the diagnosis of influenza from Nasopharyngeal swab specimens and should not be used as a sole basis for treatment. Nasal washings and aspirates are unacceptable for Xpert Xpress SARS-CoV-2/FLU/RSV testing.  Fact Sheet for Patients: EntrepreneurPulse.com.au  Fact Sheet for Healthcare Providers: IncredibleEmployment.be  This test is not yet approved or cleared by the [place] FDA and has been authorized for detection  and/or diagnosis of SARS-CoV-2 by FDA under an Emergency Use Authorization (EUA). This EUA will remain in effect (meaning this test can be used) for the duration of the COVID-19 declaration under Section 564(b)(1) of the Act, 21 U.S.C. section 360bbb-3(b)(1), unless the authorization is terminated or revoked.  Performed at [facility], [place] [address], [place], [place] [address]   Urine Culture     Status: None   Collection Time: 08/11/21  2:10 PM   Specimen: Urine, Catheterized  Result Value Ref Range Status   Specimen Description URINE, CATHETERIZED  Final   Special Requests NONE  Final   Culture   Final    NO GROWTH Performed at [facility], [address] [place], [place] [address]    Report Status 08/12/2021 FINAL  Final    SIGNED:   Charlynne Cousins, MD  [facility] 08/14/2021, 3:06 PM Pager   If 7PM-7AM, please contact night-coverage [email or web address] Password [email or web address]

## 2021-08-14 NOTE — Progress Notes (Signed)
Pick up time for pt is at 5pm. Patient currently in room waiting pickup later today

## 2021-08-14 NOTE — TOC Progression Note (Addendum)
Transition of Care (TOC) - Progression Note    Patient Details  Name: Parker Odonnell. MRN: [id number] Date of Birth: 06-07-1932  Transition of Care [facility]) CM/SW Contact  Jacalyn Lefevre Edson Snowball, RN Phone Number: 08/14/2021, 10:26 AM  Clinical Narrative:    Patient for discharge today . NCM spoke to [facility] ambulance , they have availability for noon or 5pm.   NCM called patient's daughter [name] . Five pm would be better for her to have caregivers in place.   NCM will ask bedside nurse to call [name] to go over discharge instructions    Updated [name] with home health   Melissa with [facility] aware.  [name] called back, she wants to speak with Dr Olevia Bowens, she does not want her dad to leave the hospital until they speak. Secure chatted DR Olevia Bowens and bedside nurse   [phone] Daughter called , she has spoken to MD. However, she would like scripts for flomax and Vantin sent to [facility]. MD stated he has sent scripts. The AD of 6n checked AVS, per AVS Flomax script did go to [facility] but Vantin script did not. Secure chatted MD. MD sent both scripts to [facility]. [name] aware.  Expected Discharge Plan: [place] Barriers to Discharge: Continued Medical Work up  Expected Discharge Plan and Services Expected Discharge Plan: [place]         Expected Discharge Date: 08/14/21               DME Arranged: N/A         HH Arranged: PT, OT           Social Determinants of Health (SDOH) Interventions    Readmission Risk Interventions No flowsheet data found.

## 2021-08-18 ENCOUNTER — Other Ambulatory Visit: Payer: Self-pay

## 2021-08-18 ENCOUNTER — Inpatient Hospital Stay: Payer: Medicare Other | Admitting: Internal Medicine

## 2021-08-18 ENCOUNTER — Other Ambulatory Visit: Payer: Self-pay | Admitting: *Deleted

## 2021-08-18 VITALS — BP 120/80 | HR 96 | Temp 97.7°F | Resp 17

## 2021-08-18 DIAGNOSIS — Z515 Encounter for palliative care: Secondary | ICD-10-CM

## 2021-08-18 NOTE — Progress Notes (Signed)
[facility] RN NOTE  PATIENT NAME: Parker Odonnell. DOB: 04-19-1932 MRN: [id number]  PRIMARY CARE PROVIDER: Lawerance Cruel, MD  RESPONSIBLE PARTY: Parker Odonnell (daughter) Acct ID - Guarantor Home Phone Work Phone Relationship Acct Type  [id number] HOLMES, [phone]  Self P/F     [address] [place], [place], [place] [address]   Covid-19 Pre-screening Negative  PLAN OF CARE and INTERVENTION:  ADVANCE CARE PLANNING/GOALS OF CARE: Goal is for patient to remain at [facility] with 24/7 caregivers. Daughter wants patient to be more comfortable and not be in pain. He has a DNR. PATIENT/CAREGIVER EDUCATION: Explained palliative care services vs hospice, symptom management, pain management, s/s of infection DISEASE STATUS: Joint initial visit made with LCSW, Parker Odonnell. Met with patient, son Parker Odonnell, hired caregiver Sheran Odonnell and brought daughter into visit via secure video connection. Patient resides at [facility]. He used to be independent for the most part and ambulatory using a rollator walker. In August he had a ED visit due to right leg pain along with a rash and was diagnosed with shingles. He was placed on Valtrex. He returned to the hospital again later in August due to right leg pain and also had a fall. He has had rehab with [facility] inpatient rehab and also at [facility]. He just recently returned back to [facility] facility on 08/14/21 from a hospitalization on 08/10/21 where he had gross hematuria. He has a foley catheter and was found to have a very large prostate. His Eliquis has been discontinued due to hematuria and high fall risk. He was also treated with antibiotics for a UTI. He is currently on an antibiotic, cefpodoxime twice daily and has 5 days left. The daughter and son states that since his fall in August they have noticed a significant cognitive and functional decline. He now has caregivers through [employer]. He was being transferred with assistance (stand/pivot) to the bedside commode when he first got home. They got him up Friday for about an hour, but he was in so much pain they had to place him back in bed. He is now bedbound since Saturday. His biggest issue is pain in both of his legs. He is taking Vicodin around the clock every 6 hours. Sometimes his pain is controlled and other times, mainly at night, it can be severe. He had some severe pain in both legs just last night. They also have been giving Neurontin scheduled and Tramadol on a prn basis to help. He was not in any pain during visit today but I still had them to give the Vicodin as scheduled to keep pain better controlled. He is also now having difficulties swallowing his medications which started over the weekend. Today we thought he had swallowed the Vicodin tablet but found that he had pocketed it in his mouth. Caregiver had to crush it and give it in applesauce. He has a congested cough and during auscultation faint crackles were heard in all lung fields. Suspect he may be aspirating. He is only taking in bites and sips during meals. He is total care with all ADLs including feeding. He is able to answer questions, make his needs known and follow commands, but does have some confusion. He has a stage II sacral wound. He is currently receiving wound care through [facility]. Discussed with family patient's overall decline and advised that we feel he is more appropriate for hospice services. Family is in agreement.  We did advise family they will have to discontinue home health services if going under hospice and they verbalized understanding. He is starting to have some heel pain. No redness present but very thick cracked skin noted to heels. They are now being floated on pillows. Also while I was reviewing his scans in EPIC, I noticed his recent MRI of the brain showed a 37mm nodular lesion along the left lateral aspect of his medulla, indeterminate. CT of  the chest showed a 41mm right lower lobe pulmonary nodule and recommended a CT scan in 12 months. I spoke with [facility] hospice medical director Parker Odonnell who feels patient meets eligibility criteria. Contacted his PCP Parker Odonnell, and spoke with his Parker Odonnell who says Parker Odonnell gave verbal order for hospice consult and that he agrees to be the attending. Parker Odonnell to contact the daughter Parker Odonnell to make her aware of order given. I have sent hospice order to our referral center for processing who will then be in touch with Parker Odonnell to schedule.   HISTORY OF PRESENT ILLNESS:  This is a 87 yo male who resides at [facility]. He has a past medical history of CHF, arteriosclerotic cardiovascular disease, HTN, Afib and osteoarthritis. Palliative care team was asked to follow patient, however during visit felt he was more hospice appropriate. Verbal order for hospice consult given by Parker Odonnell and info sent to [facility] referral center.   CODE STATUS: DNR ADVANCED DIRECTIVES: Y MOST FORM: no PPS: 30%   PHYSICAL EXAM:   VITALS: Today's Vitals   08/18/84 1335  BP: 120/80  Pulse: 96  Resp: 17  Temp: 97.7 F (36.5 C)  TempSrc: Temporal  SpO2: 95%  PainSc: 0-No pain    LUNGS:  faint crackles scattered throughout all lung fields CARDIAC: Cor RRR EXTREMITIES: No edema; legs elevated on pillows in bed SKIN:  Stage II sacral wound; dry, sore, cracked heels   NEURO:  Alert and oriented to person/place, cognitive impairment, increased generalized weakness, non-ambulatory   (Duration of visit and documentation 90 minutes)   Parker [facility], RN BSN

## 2021-08-18 NOTE — Progress Notes (Signed)
COMMUNITY PALLIATIVE CARE SW NOTE  PATIENT NAME: Parker Odonnell. DOB: [birth year] MRN: [id number]  PRIMARY CARE PROVIDER: Lawerance Cruel, MD  RESPONSIBLE PARTY:  Acct ID - Guarantor Home Phone Work Phone Relationship Acct Type  [id number] [name], [name]  Self P/F     [address], [place], [place], [place] [address]     PLAN OF CARE and INTERVENTIONS:             GOALS OF CARE/ ADVANCE CARE PLANNING:  Goal is for patient to be relieved of his pain. Patient is a DNR, form in the home.  SOCIAL/EMOTIONAL/SPIRITUAL ASSESSMENT/ INTERVENTIONS:  SW and RN-Parker Odonnell completed an initial visit with patient at [facility]. He was present with his son-Parker Odonnell, sitter-Parker Odonnell and daughter-Parker Odonnell who joined the visit virtually. Patient was observed in the bed, eyes closed. However, when he was addressed, he briefly opened his eyes. Patient appears to be thin an frail in appearance. He appeared to restless at times while in bed. SW also observed patient to have cough, particularly after taking some fluids. His children and sitter report that patient has had swallowing difficulties where he is only taking bites of food and sips of fluids. Patient is dependent for all ADL's. He medications have to be crushed in applesauce and encouraged to swallow in order to take his medications. Patient was discharged from [facility] on 10/27 and has continued to decline and he has been bedbound since that time. Due to the increased pain, he is given pain medications every 6 hours, but it is minimally managing his pain. Patient also has has a wound at the base of his spine that is manage by [facility]. The team provided education regarding the differences in hospice and palliative care. The expressed that patient maybe more appropriate for hospice care in which both children agreed. RN to discuss with palliative care director for appropriateness and will follow-up with the family. SW provided  supportive presence, observation, assessment of patient's needs and comfort, coping and needs of PCG and education and reassurance of support.  PATIENT/CAREGIVER EDUCATION/ COPING: Patient and family appear to be coping well.  PERSONAL EMERGENCY PLAN:   Per facility protocol.  COMMUNITY RESOURCES COORDINATION/ HEALTH CARE NAVIGATION:  Patient has 24 hour caregiver  FINANCIAL/LEGAL CONCERNS/INTERVENTIONS:  None.     SOCIAL HX:  Social History   Tobacco Use   Smoking status: Never   Smokeless tobacco: Never  Substance Use Topics   Alcohol use: No    CODE STATUS: DNR ADVANCED DIRECTIVES: Yes MOST FORM COMPLETE:  No HOSPICE EDUCATION PROVIDED: No  PPS: Patient is bedbound and total care.  Duration and documentation: 60 minutes.       [address] [place], [place]

## 2021-08-19 ENCOUNTER — Other Ambulatory Visit: Payer: Self-pay

## 2021-08-19 ENCOUNTER — Telehealth (INDEPENDENT_AMBULATORY_CARE_PROVIDER_SITE_OTHER): Payer: Medicare Other | Admitting: Infectious Diseases

## 2021-08-19 DIAGNOSIS — B029 Zoster without complications: Secondary | ICD-10-CM

## 2021-08-19 NOTE — Progress Notes (Signed)
Virtual Visit via Video Note  I connected withNAME@ on 08/19/21 at  2:30 PM EDT by a video enabled telemedicine application and verified that I am speaking with the correct person using two identifiers.  Location: Patient: Facility  Provider: [facility]   I discussed the limitations of evaluation and management by telemedicine and the availability of in person appointments. The patient expressed understanding and agreed to proceed.  [facility] for Infectious Disease  Patient Active Problem List   Diagnosis Date Noted   Gross hematuria 41/96/2229   Complicated UTI (urinary tract infection) 08/10/2021   Cognitive retention disorder    Malnutrition of moderate degree 07/03/2021   Hyperkalemia    Hyponatremia    Neuropathic pain    Pressure injury of skin 06/26/2021   VZV (varicella-zoster virus) infection 06/25/2021   Vasculitis (HCC)    Chronic diastolic congestive heart failure (HCC)    Persistent atrial fibrillation (HCC)    Weakness of both lower extremities    Rash 06/10/2021   Weakness 06/10/2021   Chronic combined systolic (congestive) and diastolic (congestive) heart failure ([facility]) 06/10/2021   DNR (do not resuscitate) 06/10/2021   OA (osteoarthritis) of hip 01/10/2020   Primary osteoarthritis of left hip 01/10/2020   Preoperative cardiovascular examination    Shortness of breath    Bilateral pleural effusion    Thrombocytopenia ([facility]) 06/03/2018   Atrial flutter ([facility]) 06/03/2018   Acute on chronic systolic CHF (congestive heart failure) ([facility]) 06/03/2018   Bilateral edema of lower extremity    Essential hypertension 09/19/2015   LBBB (left bundle branch block) 09/19/2015   Hypokalemia 08/27/2014   ASCVD (arteriosclerotic cardiovascular disease)    Dyslipidemia    ED (erectile dysfunction)    Left bundle branch block    Postsurgical percutaneous transluminal coronary angioplasty status    Coronary atherosclerosis of native coronary artery    Bilateral inguinal hernia  06/15/2012   Current Outpatient Medications on File Prior to Visit  Medication Sig Dispense Refill   acetaminophen (TYLENOL) 325 MG tablet Take 2 tablets (650 mg total) by mouth every 6 (six) hours as needed for mild pain (or Fever >/= 101).     atorvastatin (LIPITOR) 40 MG tablet TAKE ONE TABLET BY MOUTH EVERYDAY AT BEDTIME (Patient taking differently: Take 40 mg by mouth at bedtime.) 90 tablet 3   carvedilol (COREG) 6.25 MG tablet Take 1 tablet (6.25 mg total) by mouth 2 (two) times daily with a meal.     Ensure Plus (ENSURE PLUS) LIQD Take 237 mLs by mouth 2 (two) times daily between meals.     finasteride (PROSCAR) 5 MG tablet Take 1 tablet (5 mg total) by mouth daily. 30 tablet 3   gabapentin (NEURONTIN) 100 MG capsule Take 1 capsule (100 mg total) by mouth 2 (two) times daily. 30 capsule 0   lidocaine (LIDODERM) 5 % Place 1 patch onto the skin daily. Remove & Discard patch within 12 hours or as directed by MD 30 patch 0   magnesium hydroxide (MILK OF MAGNESIA) 400 MG/5ML suspension Take 30 mLs by mouth daily as needed for mild constipation.     polyethylene glycol (MIRALAX / GLYCOLAX) 17 g packet Take 17 g by mouth daily. 14 each 0   tamsulosin (FLOMAX) 0.4 MG CAPS capsule Take 1 capsule (0.4 mg total) by mouth daily. 30 capsule 3   traMADol (ULTRAM) 50 MG tablet Take 50 mg by mouth every 6 (six) hours as needed for moderate pain.     No current  facility-administered medications on file prior to visit.     HPI/Interval events Here for HFU in the setting of h/o VZV encephalitis. Patient had a prolonged hospital stay and was admitted to rehab. Patient completed course of IV acycylovir on 06/25/21. Patient was readmitted in the hospital 10/23-10/27 for hematuria, seen by Urology, CBI done with resolution of hematuria. He was also treated for complicated UTI at that time with ceftriaxone followed by Vantin.   Patient on bed during the video encounter. I spoke to his daughter who said that  patient had a downhill course after being admitted to the hospital where he became progressively weak, complains of pain in multiple regions of his body. She also thinks he gets confused at time and gets confused among the family members. His appetite has been poor and daughter thinks he might have lost approx 25 lbs since hopitlisation. They have been followed by palliative care with possibility of transition to hospice/comfort care.   ROS  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   Cancer (HCC)    CHF (congestive heart failure) (HCC)    Coronary atherosclerosis of native coronary artery    DNR (do not resuscitate) 06/10/2021   Dyslipidemia    ED (erectile dysfunction)    GERD (gastroesophageal reflux disease)    hx of   History of kidney stones    Hypertension    Inguinal hernia    Left bundle branch block    chronic   Postsurgical percutaneous transluminal coronary angioplasty status    Shingles     Social History   Tobacco Use   Smoking status: Never   Smokeless tobacco: Never  Vaping Use   Vaping Use: Never used  Substance Use Topics   Alcohol use: No   Drug use: No    Family History  Problem Relation Age of Onset   Arthritis Father    Hypertension Paternal Uncle    Heart attack Neg Hx    Stroke Neg Hx     No Known Allergies  Health Maintenance  Topic Date Due   COVID-19 Vaccine (1) Never done   Pneumonia Vaccine 58+ Years old (1 - PCV) Never done   TETANUS/TDAP  Never done   Zoster Vaccines- Shingrix (1 of 2) Never done   INFLUENZA VACCINE  05/19/2021   HPV VACCINES  Aged Out    Observations/Objective: Lying in the bed  Assessment and Plan: 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 Discussed with daughter  that I will be available as needed    I discussed the assessment and treatment plan with the patient. The patient was provided an opportunity to ask questions and all were answered. The patient agreed with the plan and demonstrated an understanding of the instructions.   The patient was advised to call back or seek an in-person evaluation if the symptoms worsen or if the condition fails to improve as anticipated.  I provided 15 minutes of non-face-to-face time during this encounter.  Wilber Oliphant, [place] for [facility] [phone] pager   [phone] cell 08/19/2021, 2:59 PM

## 2021-09-18 DEATH — deceased

## 2023-04-28 IMAGING — CR DG HIP (WITH OR WITHOUT PELVIS) 2-3V*R*
3 series · 3 of 3 positions shown · non-contrast
Comparison: None.

CLINICAL DATA: Recent fall with right leg pain, initial encounter

EXAM:
DG HIP (WITH OR WITHOUT PELVIS) 3V RIGHT

[x pelvis]
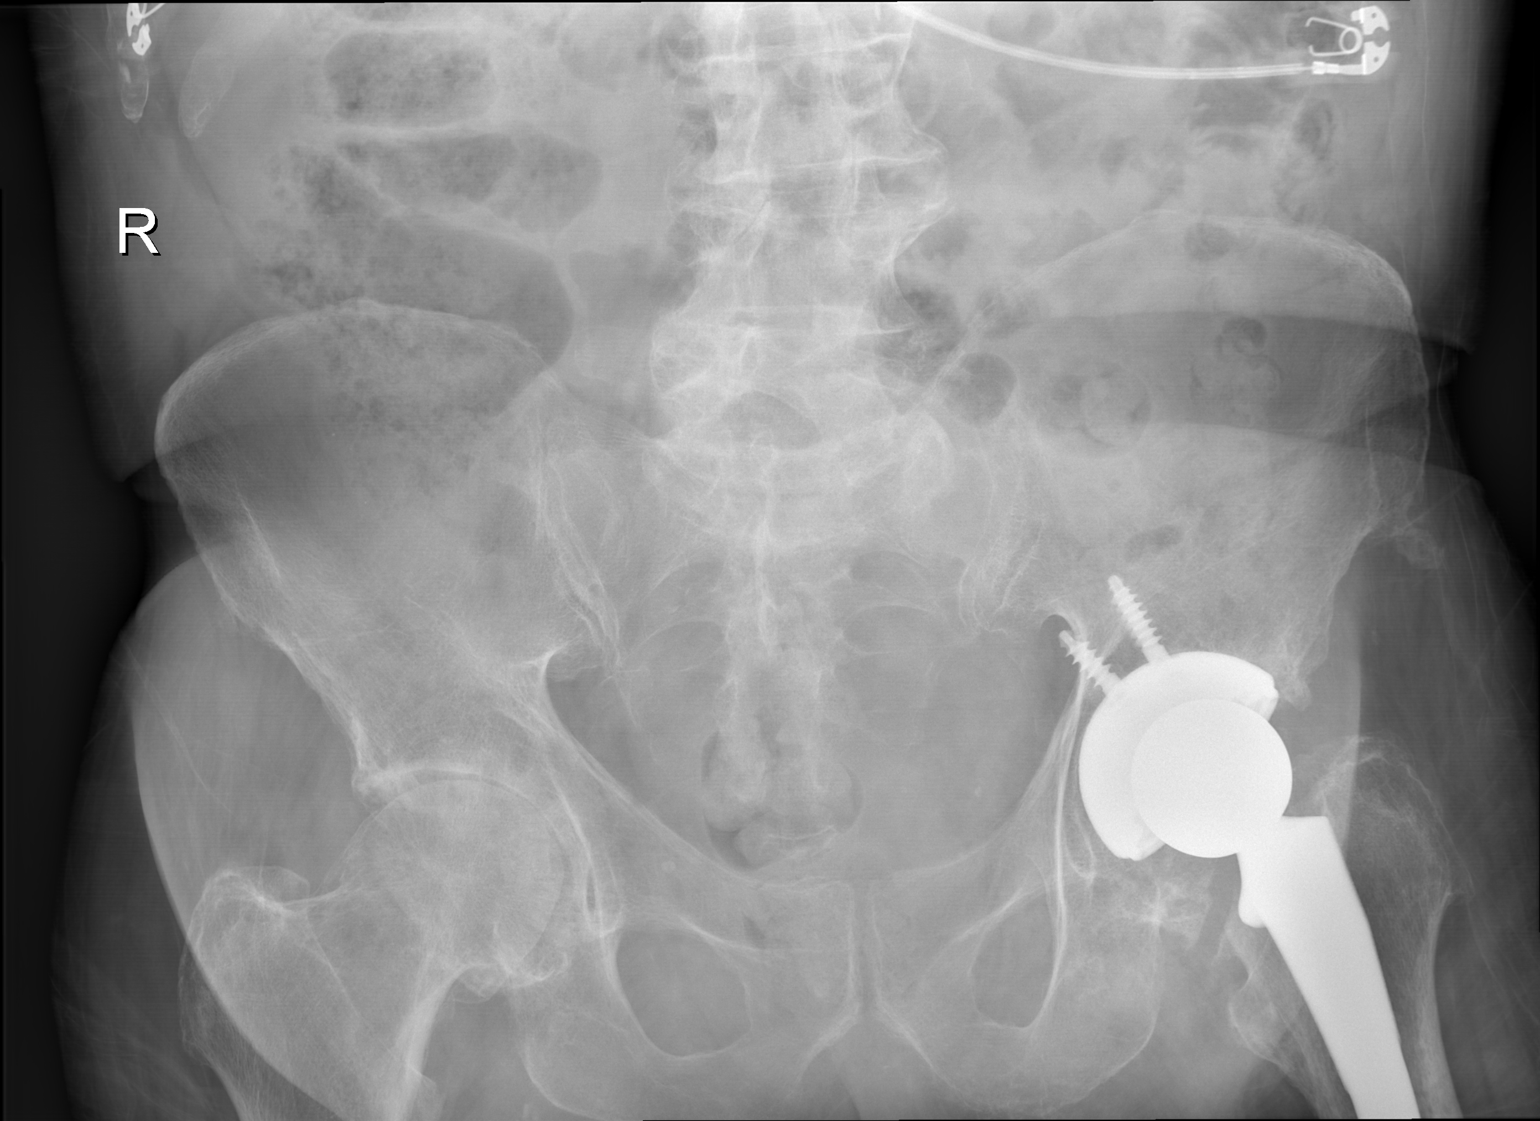

[x hip ap right]
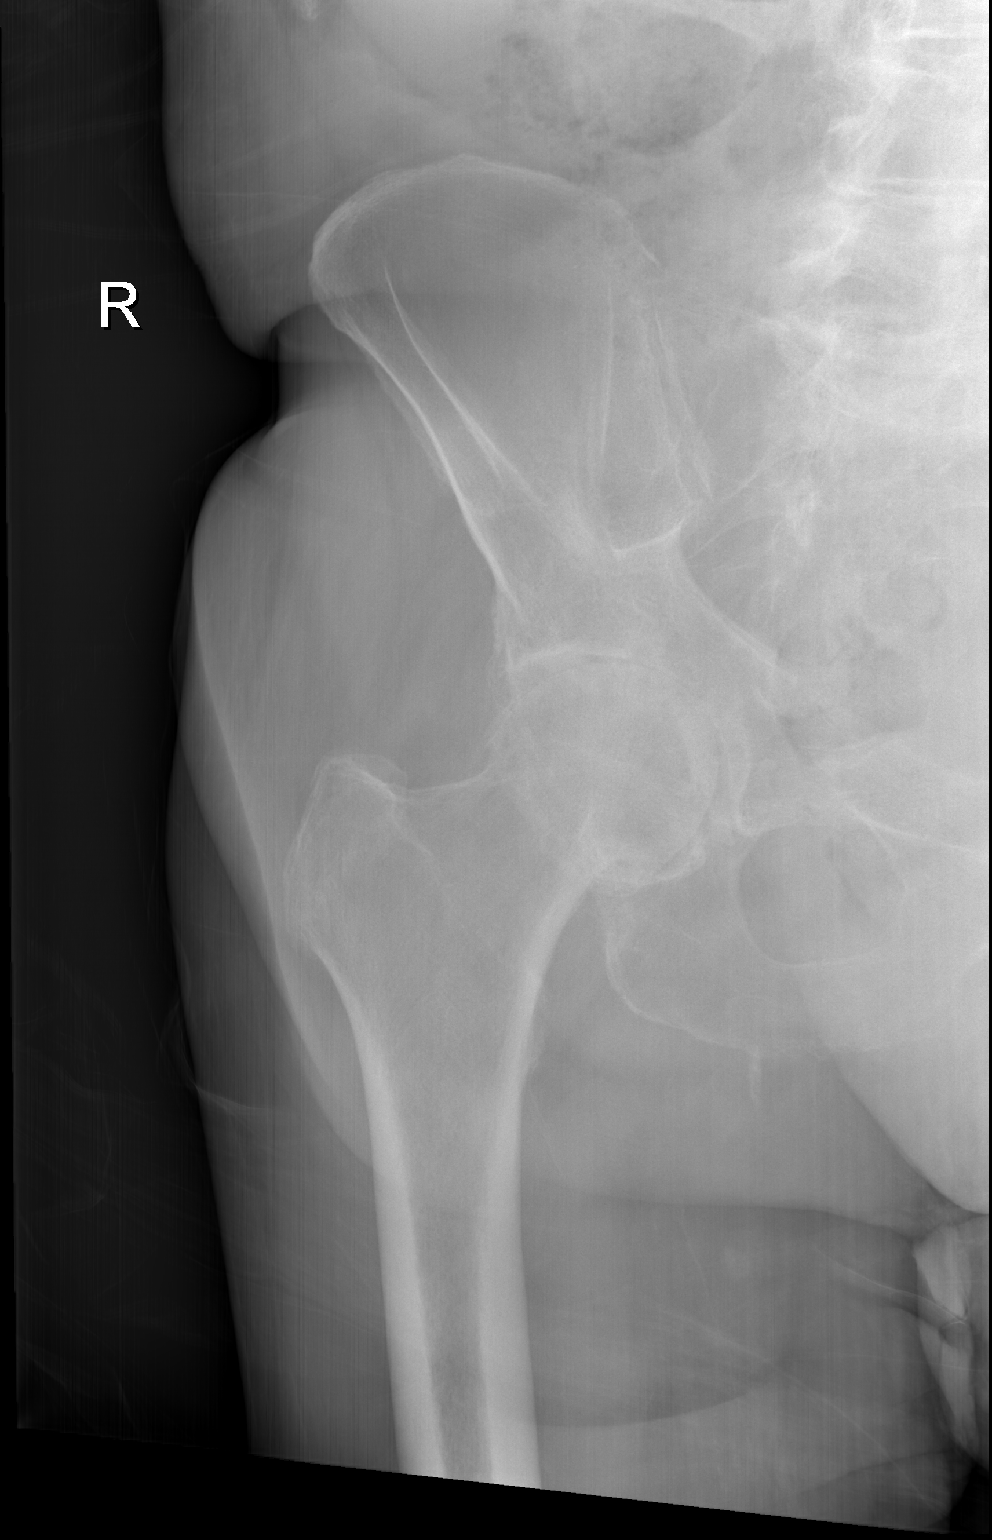

[x hip lat right]
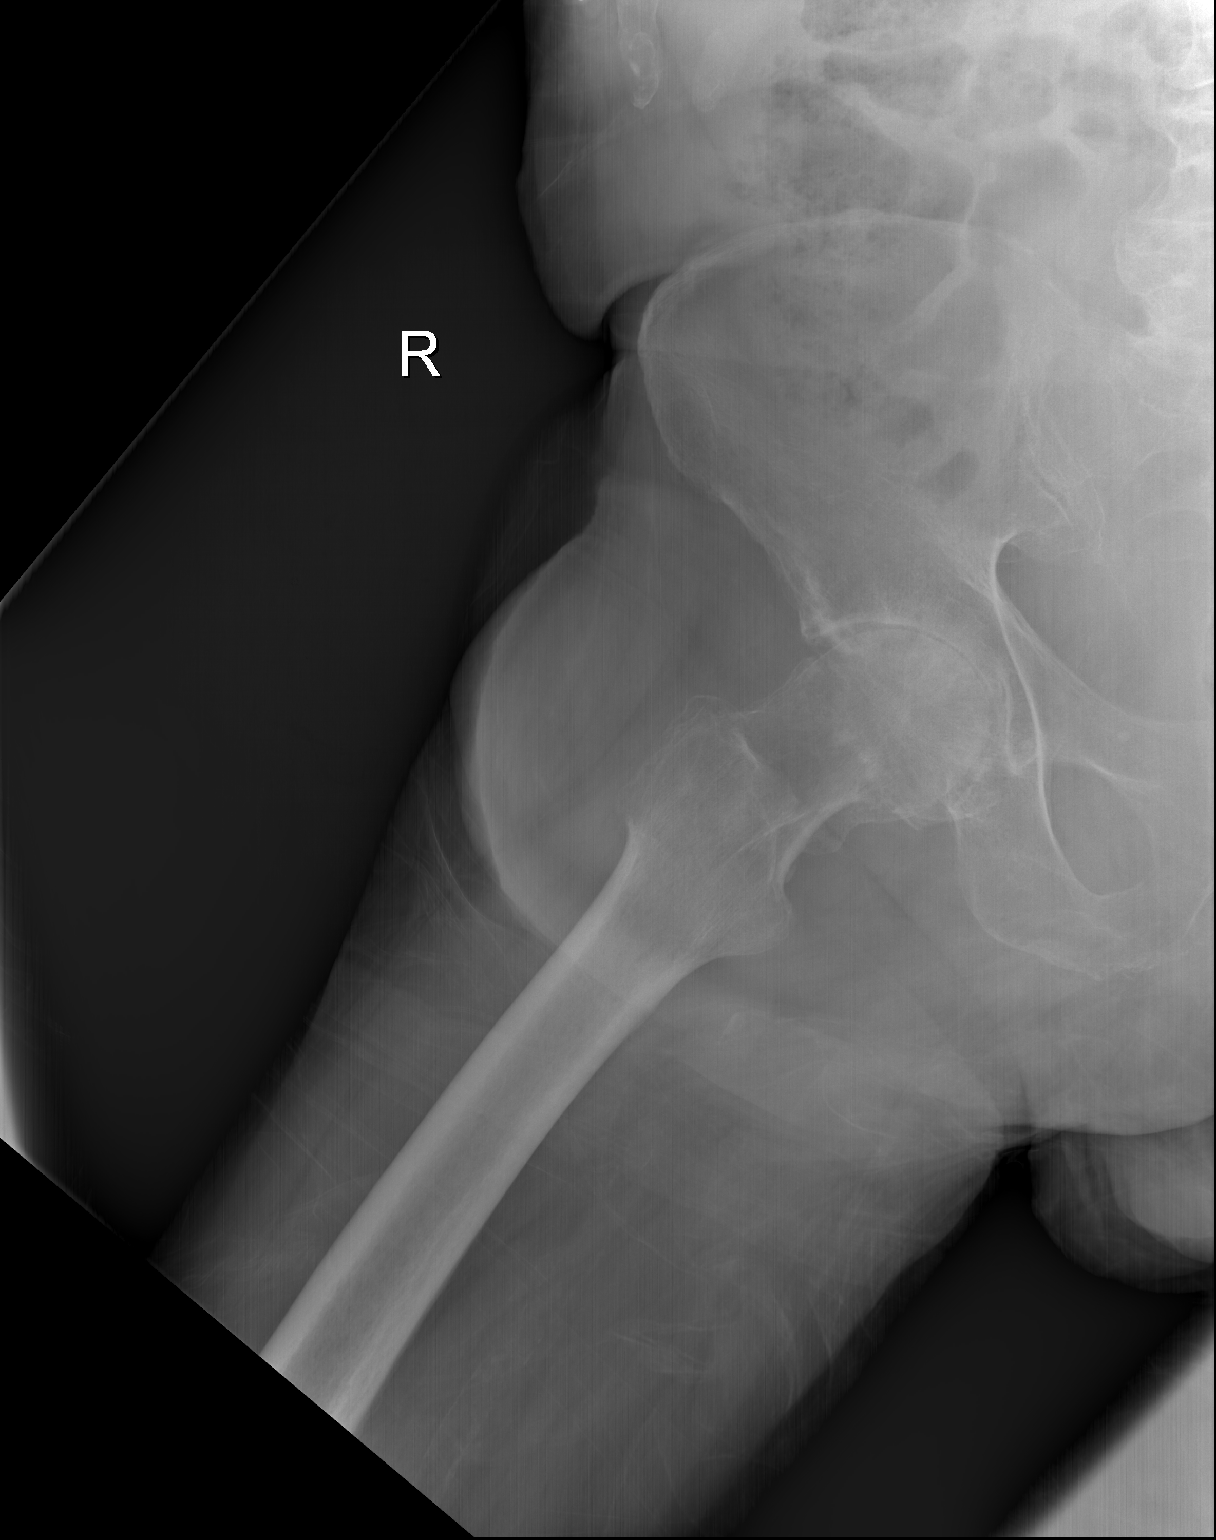

[3 of 3 positions shown; findings below may reference images not displayed]

FINDINGS: Pelvic ring is intact. Left hip replacement is noted. Significant
degenerative changes of the right hip joint are seen. No acute
fracture or dislocation is noted. No soft tissue abnormality is
seen.
IMPRESSION: Degenerative change of the right hip joint. No acute bony
abnormality noted.

## 2023-07-07 IMAGING — CT CT RENAL STONE PROTOCOL
2 of 4 series · 14 of 46 positions shown, 16 images · non-contrast
Comparison: 06/14/2021

CLINICAL DATA: Hematuria.  Foley catheter since [REDACTED].

EXAM:
CT ABDOMEN AND PELVIS WITHOUT CONTRAST
TECHNIQUE: Multidetector CT imaging of the abdomen and pelvis was performed
following the standard protocol without IV contrast.

[Series 3: ap without · axial · non-contrast · 0.68mm/px · z∈[-1255,-830]mm · 11 of 97 slices shown, 13 images]
[im 6/97  soft-tissue]
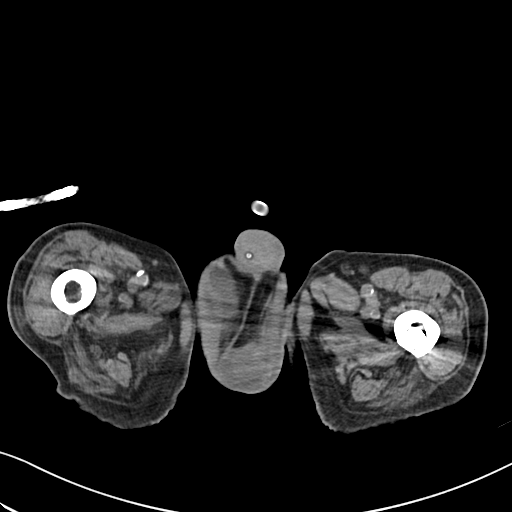
[im 6/97  bone]
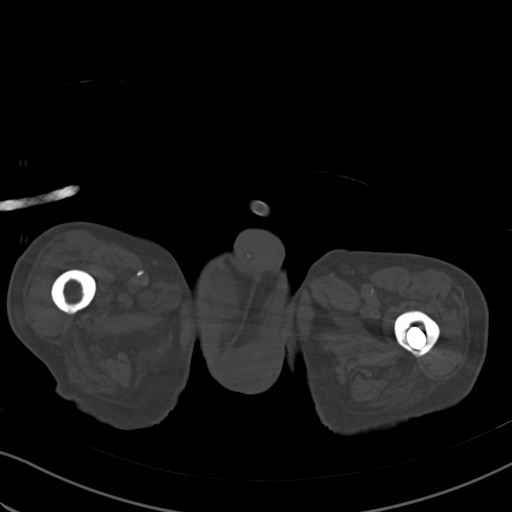
[im 16/97  soft-tissue]
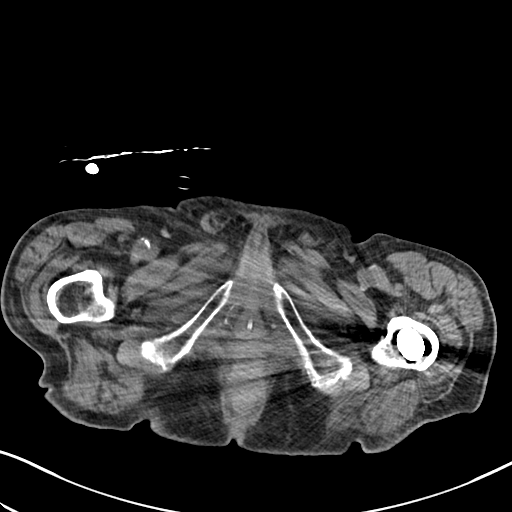
[im 26/97  soft-tissue]
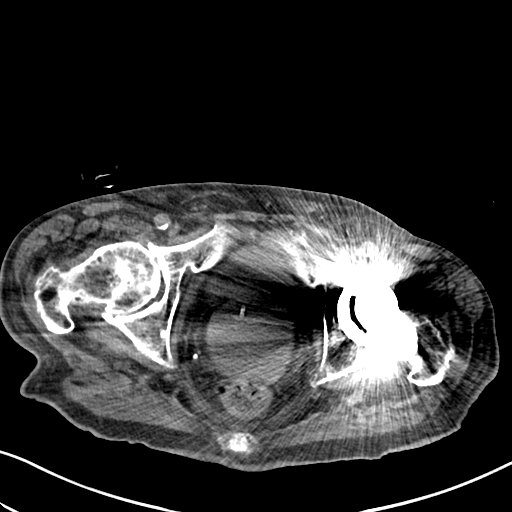
[im 31/97  soft-tissue]
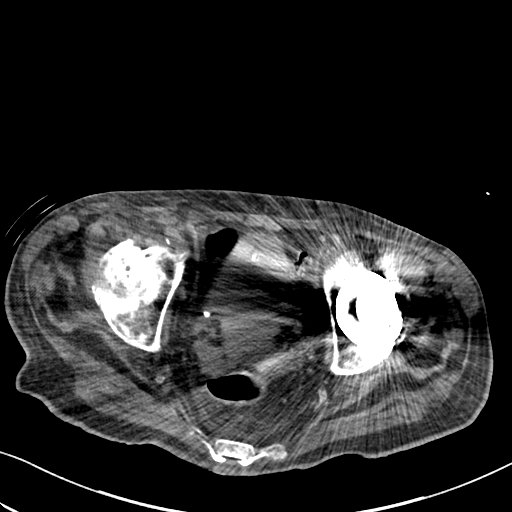
[im 41/97  soft-tissue]
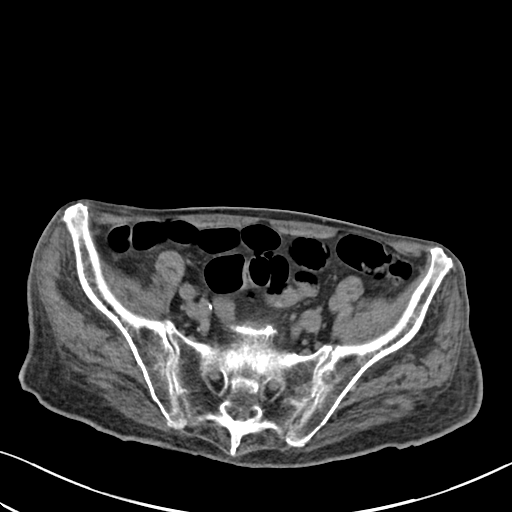
[im 51/97  soft-tissue]
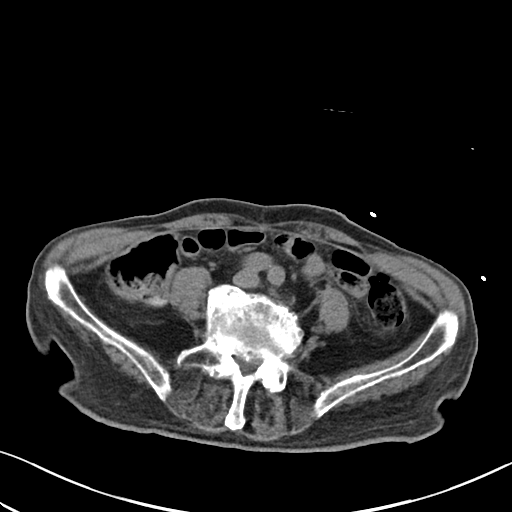
[im 56/97  soft-tissue]
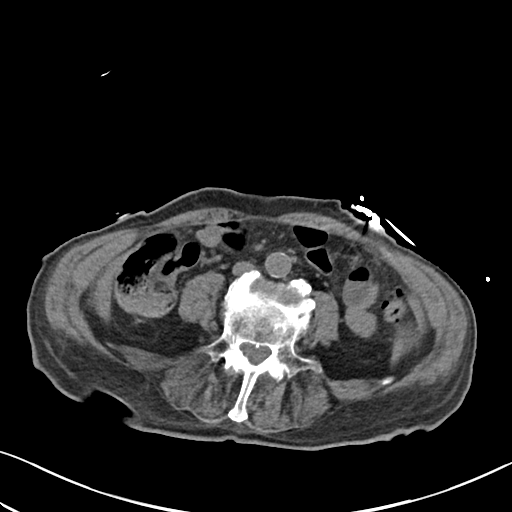
[im 66/97  soft-tissue]
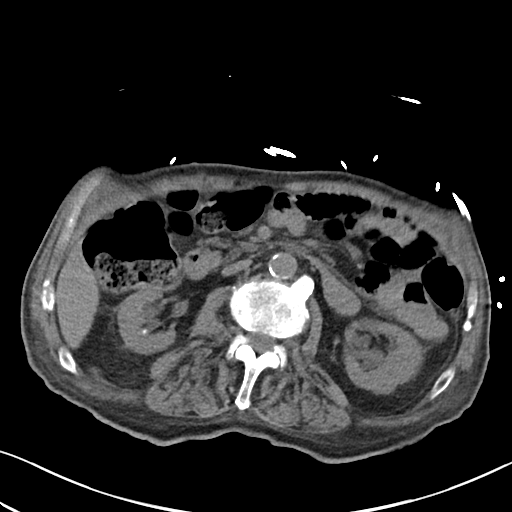
[im 71/97  soft-tissue]
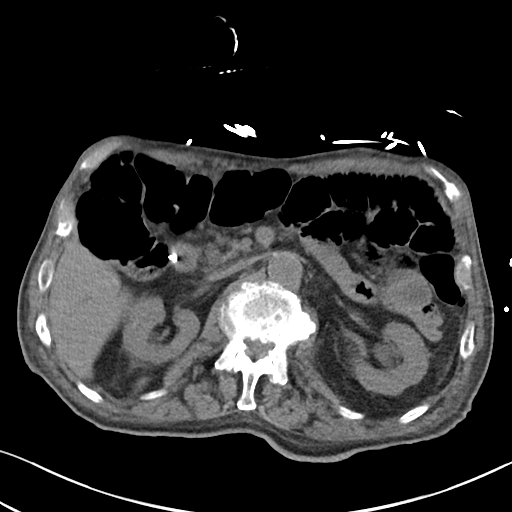
[im 71/97  bone]
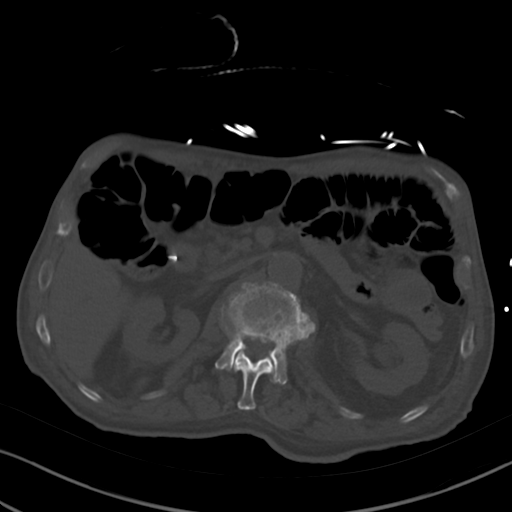
[im 81/97  soft-tissue]
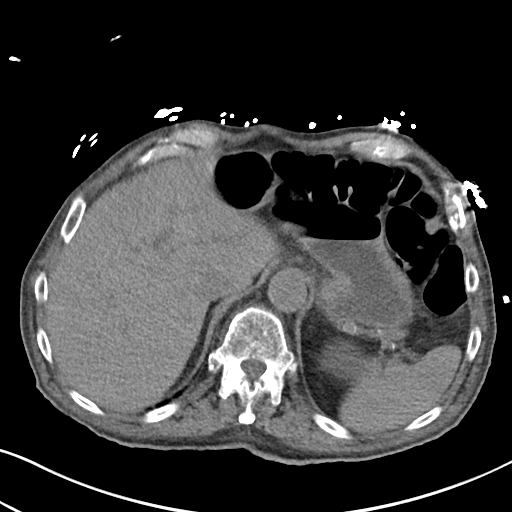
[im 91/97  soft-tissue]
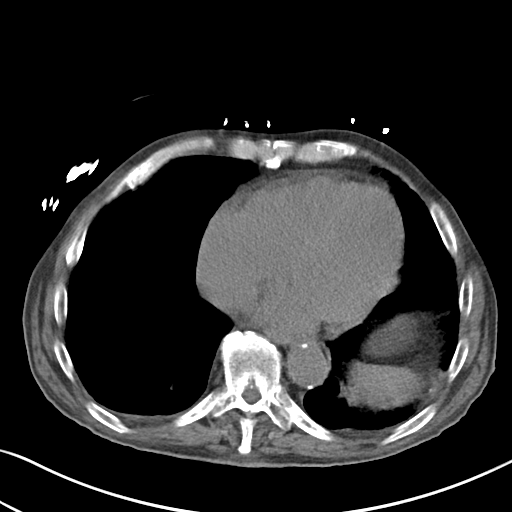

[Series 6: cor · coronal · 0.63mm/px · 3 of 85 slices shown]
[im 29/85  soft-tissue]
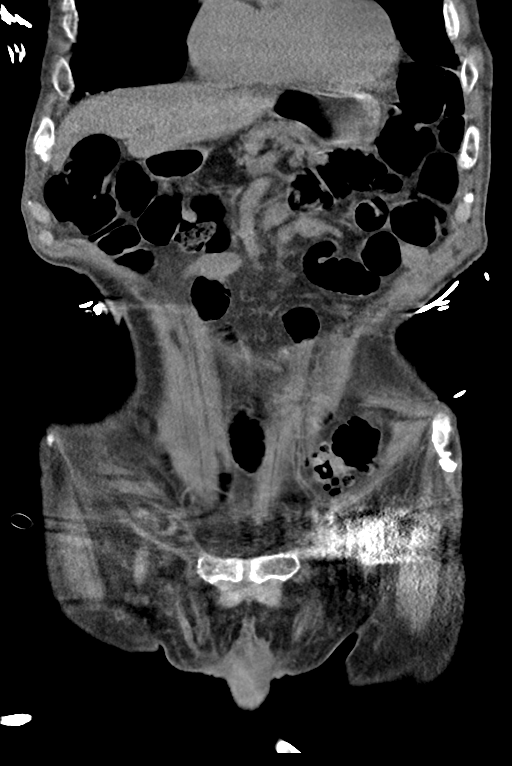
[im 38/85  soft-tissue]
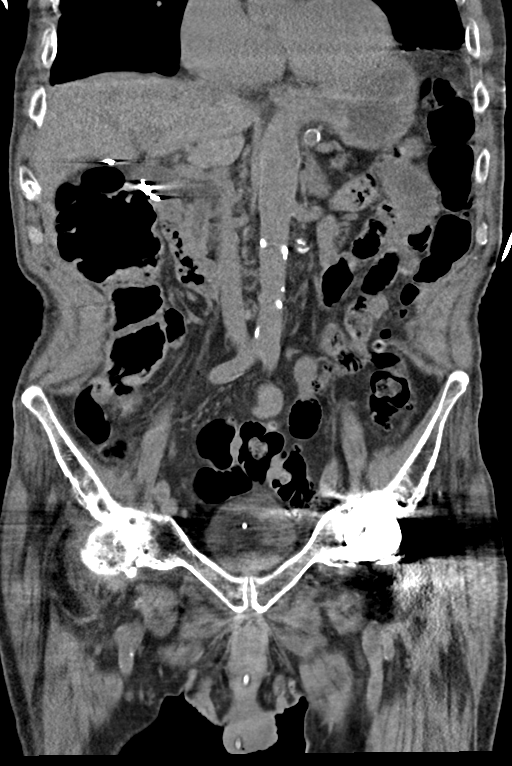
[im 47/85  soft-tissue]
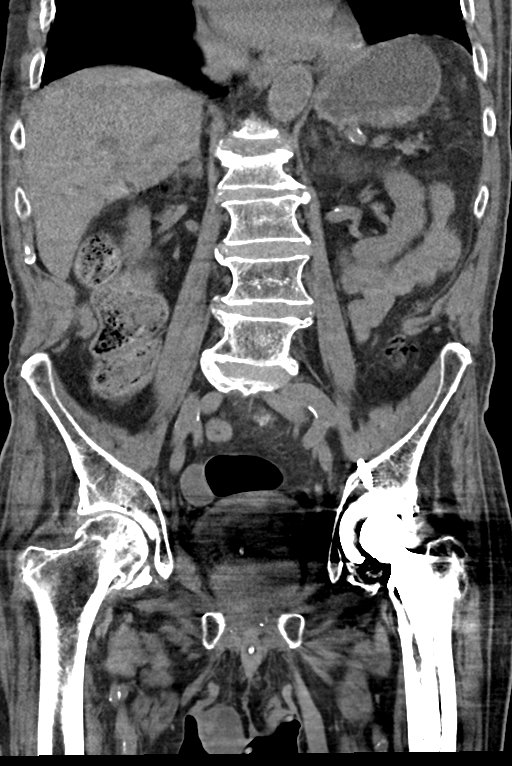

[14 of 46 positions shown; findings below may reference images not displayed]

FINDINGS: Lower chest: Motion degraded images. No acute abnormality. Coronary
artery calcifications noted.

Hepatobiliary: Cholecystectomy.  Liver is homogeneous.

Pancreas: Unremarkable. No pancreatic ductal dilatation or
surrounding inflammatory changes.

Spleen: Normal in size without focal abnormality.

Adrenals/Urinary Tract: The adrenal glands are normal.

LEFT kidney: 5.5 centimeters cyst in the UPPER pole region.
Intrarenal calculi measure 2-3 millimeters throughout the LEFT
kidney. There is no hydronephrosis. Parapelvic cysts are noted. The
LEFT ureter is unremarkable.

RIGHT kidney: Intrarenal calculi measure 2-4 millimeters throughout
the RIGHT kidney. There is no hydronephrosis. Parapelvic renal cysts
are present. The RIGHT ureter is unremarkable.

Bladder: A Foley catheter decompresses the urinary bladder. There is
significant streak artifact from hardware in the pelvis which limits
evaluation of the distal ureters and urinary bladder.

Stomach/Bowel: Stomach and small bowel loops are normal in
appearance. There are numerous colonic diverticula. No associated
inflammatory changes. Average stool burden. The appendix is not well
seen.

Vascular/Lymphatic: There is atherosclerotic calcification of the
abdominal aorta. No associated aneurysm. There is normal vascular
opacification of the celiac axis, superior mesenteric artery, and
inferior mesenteric artery. Normal appearance of the portal venous
system and inferior vena cava. No retroperitoneal or mesenteric
adenopathy.

Reproductive: The prostate is significantly enlarged. Prostatic
calcifications are noted.

Other: No ascites.  Abdominal wall is unremarkable.

Musculoskeletal: Significant degenerative changes in the RIGHT hip.
Status post LEFT hip arthroplasty, with significant streak artifact.
Moderate degenerative changes throughout the lumbar spine. Stable
appearance of superior endplate fracture of L1.
IMPRESSION: 1. Multiple nonobstructing intrarenal calculi bilaterally. No
evidence for ureteral abnormality. Foley catheter. Streak artifact
limits full evaluation of the distal ureters and urinary bladder.
However, no obstruction is suspected.
2. Marked enlargement of prostate, similar in appearance to prior
study.
3. Colonic diverticulosis without acute diverticulitis.
4.  Aortic atherosclerosis.  ([id number])
5. Status post LEFT hip arthroplasty. Degenerative changes of the
RIGHT hip.
6. Stable L1 compression fracture.
